# Patient Record
Sex: Male | Born: 1940
Health system: Southern US, Community
[De-identification: ages and names within clinical notes are randomized; demographics above are authoritative.]

## PROBLEM LIST (undated history)

## (undated) DIAGNOSIS — F1010 Alcohol abuse, uncomplicated: Secondary | ICD-10-CM

## (undated) DIAGNOSIS — M1990 Unspecified osteoarthritis, unspecified site: Secondary | ICD-10-CM

## (undated) DIAGNOSIS — C3490 Malignant neoplasm of unspecified part of unspecified bronchus or lung: Secondary | ICD-10-CM

## (undated) DIAGNOSIS — I671 Cerebral aneurysm, nonruptured: Secondary | ICD-10-CM

## (undated) HISTORY — PX: LUNG SURGERY: SHX703

## (undated) HISTORY — PX: BRAIN SURGERY: SHX531

---

## 1997-10-01 ENCOUNTER — Emergency Department (HOSPITAL_COMMUNITY): Admission: EM | Admit: 1997-10-01 | Discharge: 1997-10-01 | Payer: Self-pay | Admitting: Emergency Medicine

## 1998-05-06 ENCOUNTER — Emergency Department (HOSPITAL_COMMUNITY): Admission: EM | Admit: 1998-05-06 | Discharge: 1998-05-06 | Payer: Self-pay | Admitting: Emergency Medicine

## 1998-05-09 ENCOUNTER — Encounter: Admission: RE | Admit: 1998-05-09 | Discharge: 1998-05-09 | Payer: Self-pay | Admitting: Internal Medicine

## 1998-05-19 ENCOUNTER — Encounter: Admission: RE | Admit: 1998-05-19 | Discharge: 1998-05-19 | Payer: Self-pay | Admitting: Internal Medicine

## 2005-06-10 IMAGING — CR DG CHEST 1V PORT
1 series · 1 of 1 positions shown · non-contrast
Comparison: none

CLINICAL DATA: PORTABLE CHEST - 1 VIEW:

[view not recorded]
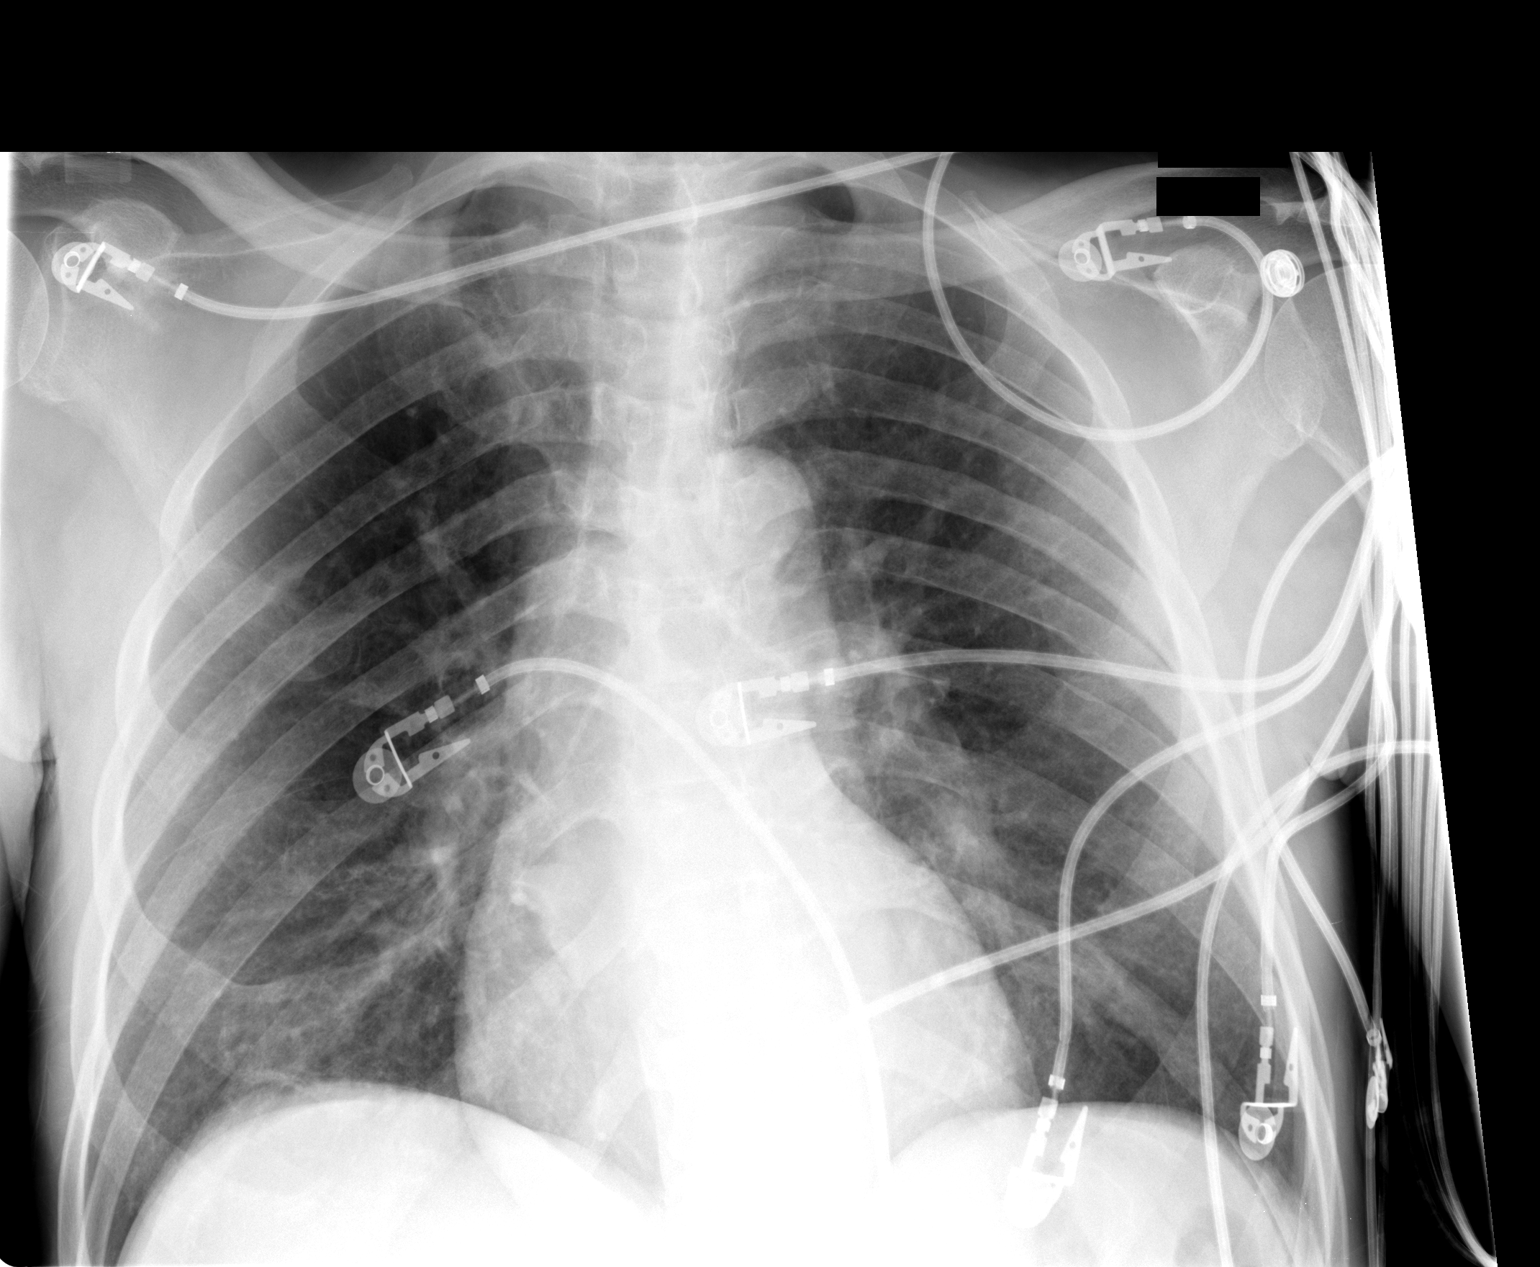

[1 of 1 positions shown; findings below may reference images not displayed]

FINDINGS: The heart size is normal.  There are no effusions or edema.  No focal airspace opacities are noted.
IMPRESSION: No active cardiopulmonary disease.

## 2005-06-10 IMAGING — CT CT HEAD W/O CM
1 series · 16 of 30 positions shown, 20 images · IV contrast (agent unspecified)
Comparison: None.

CLINICAL DATA: Headaches, evaluate for CVA.
 HEAD CT WITHOUT CONTRAST:
TECHNIQUE: Contiguous axial images were obtained from the base of the skull through the vertex according to standard protocol without contrast.

[Series 3: headseq 4.8 h45s · axial · 0.47mm/px · z∈[-221,-90]mm · 16 of 30 slices shown, 20 images]
[im 2/30  brain]
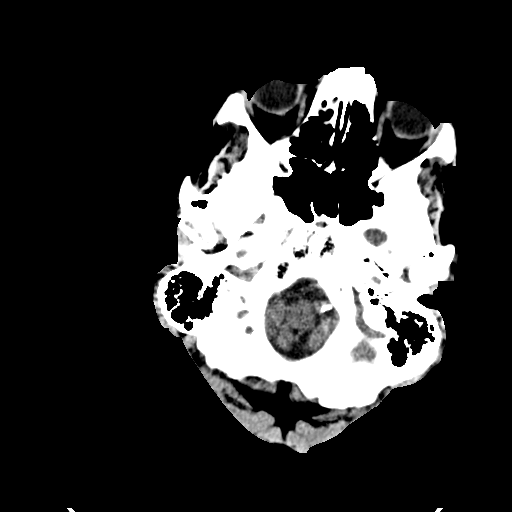
[im 2/30  bone]
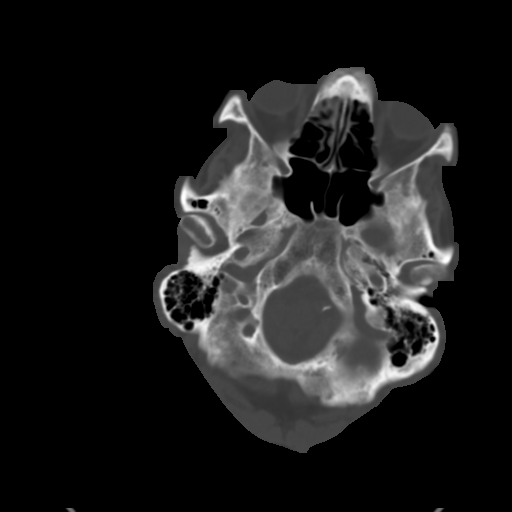
[im 4/30  brain]
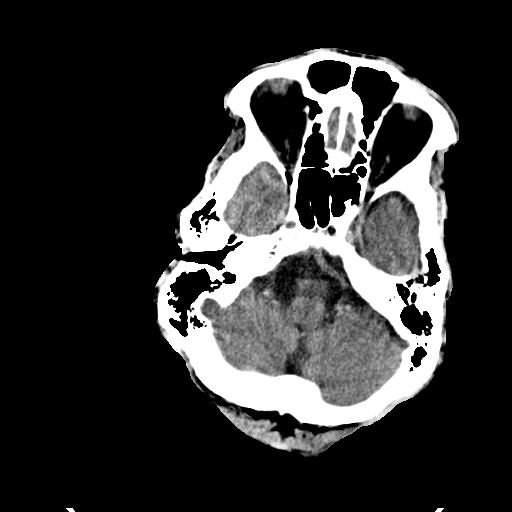
[im 6/30  brain]
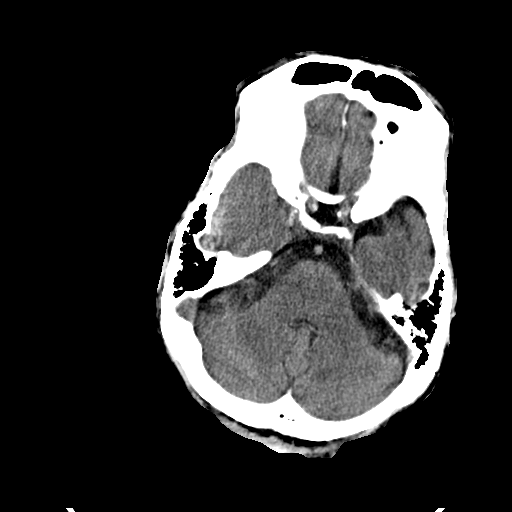
[im 8/30  brain]
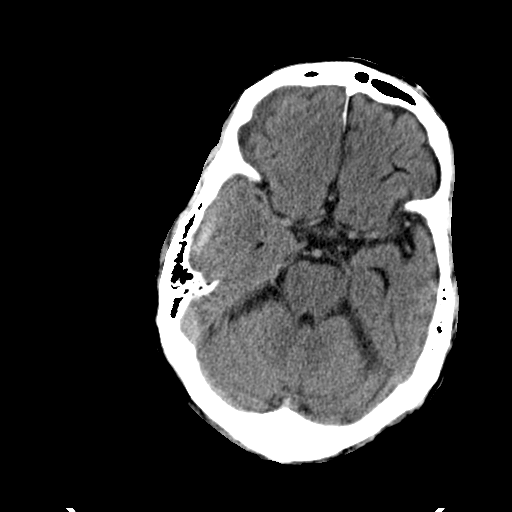
[im 9/30  brain]
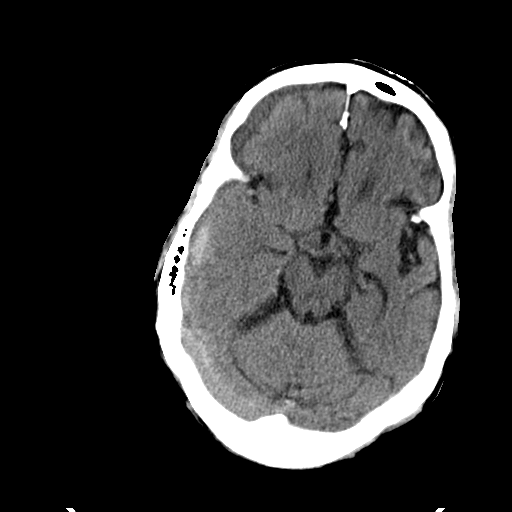
[im 9/30  bone]
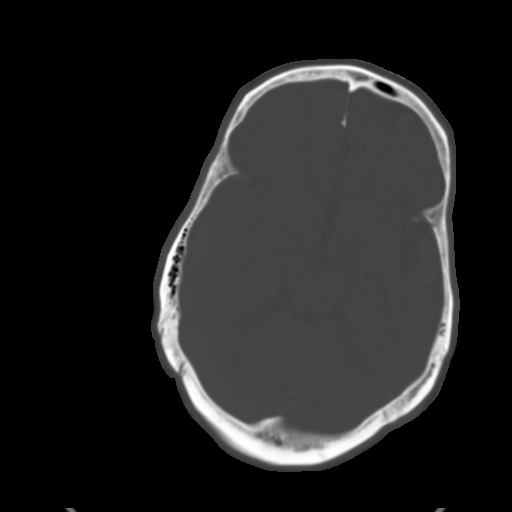
[im 11/30  brain]
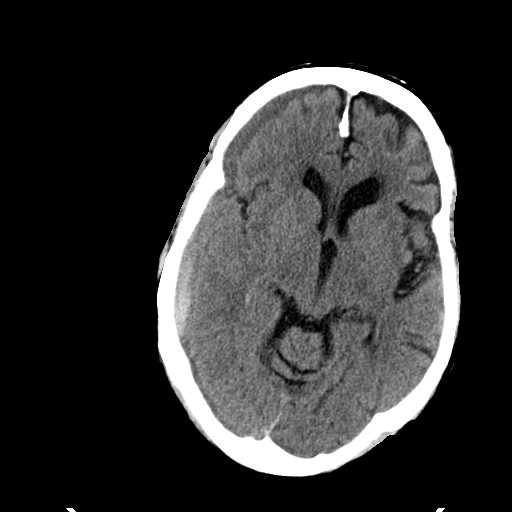
[im 13/30  brain]
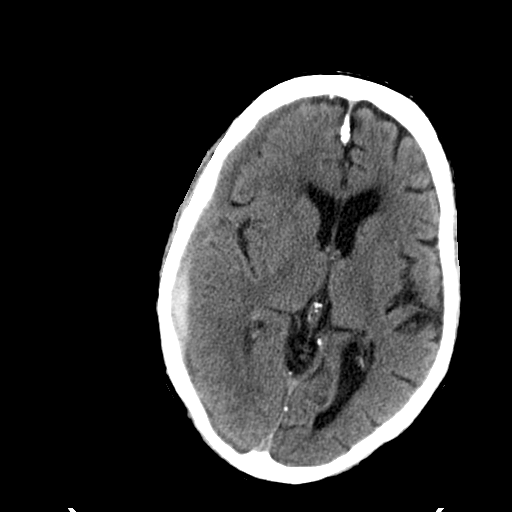
[im 15/30  brain]
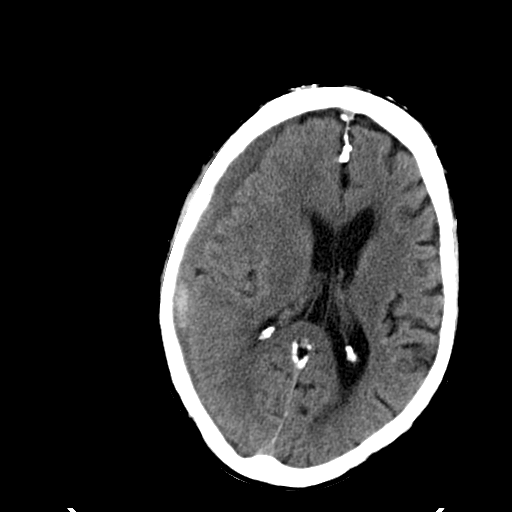
[im 16/30  brain]
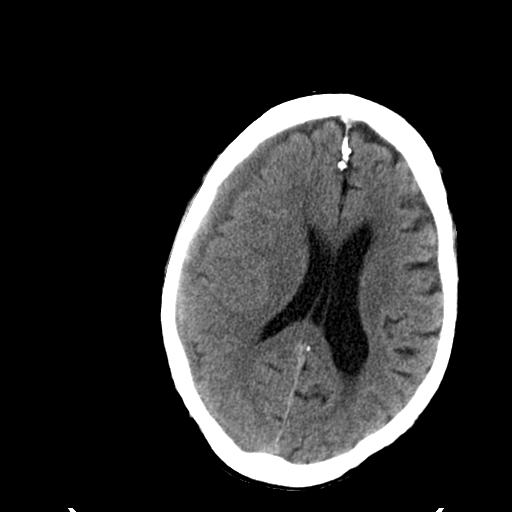
[im 16/30  bone]
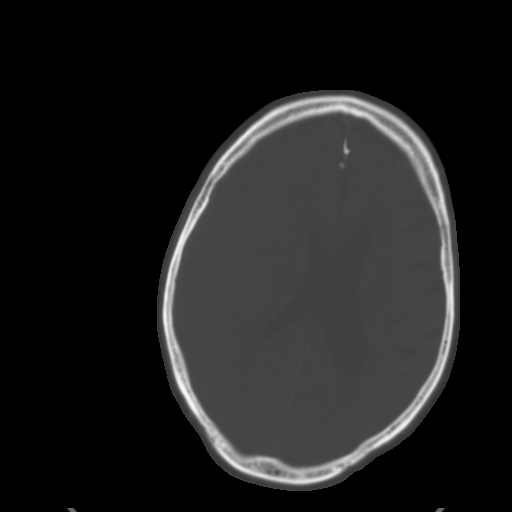
[im 18/30  brain]
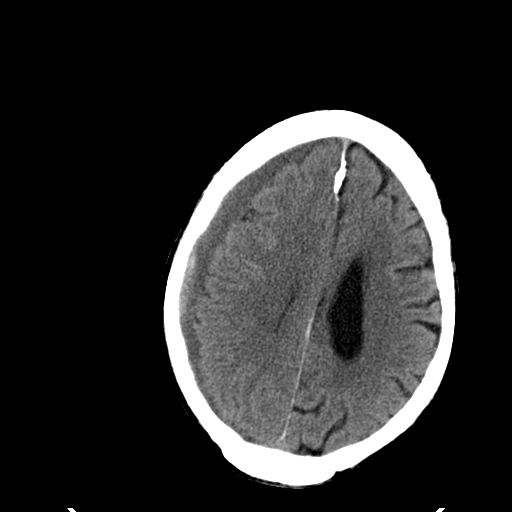
[im 20/30  brain]
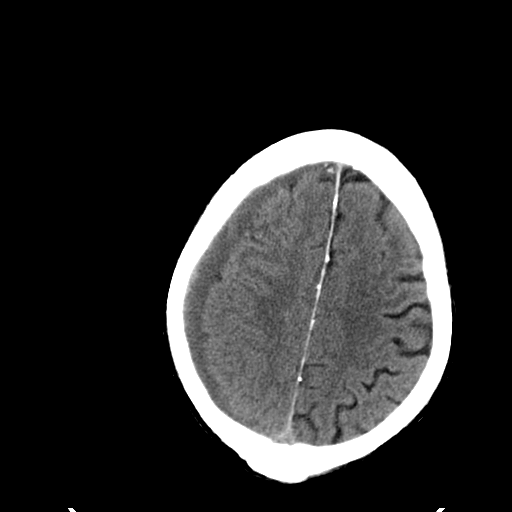
[im 22/30  brain]
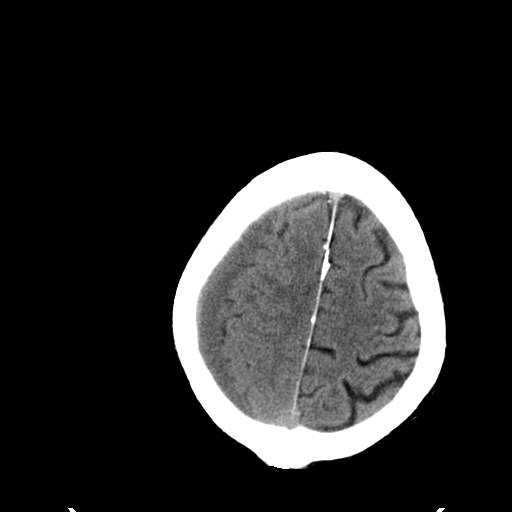
[im 23/30  brain]
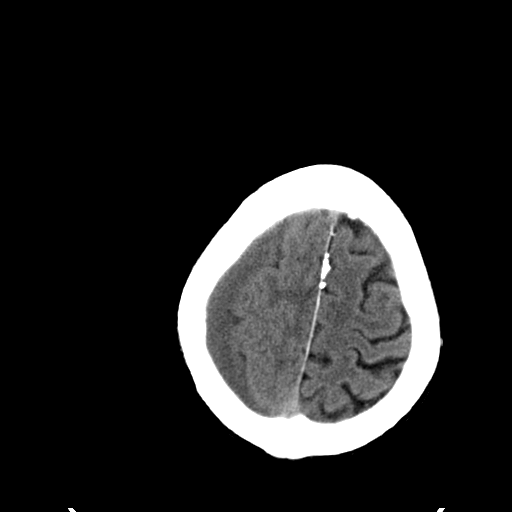
[im 23/30  bone]
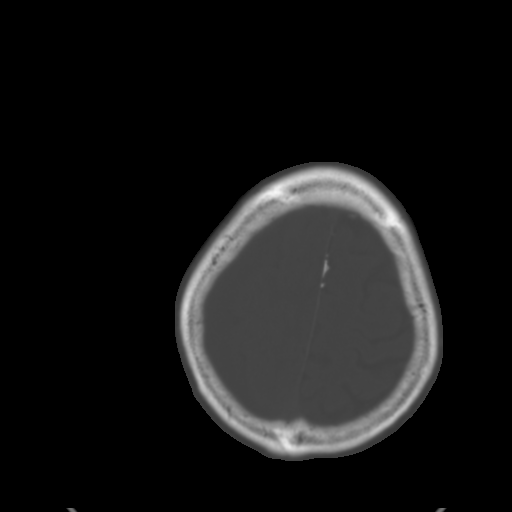
[im 25/30  brain]
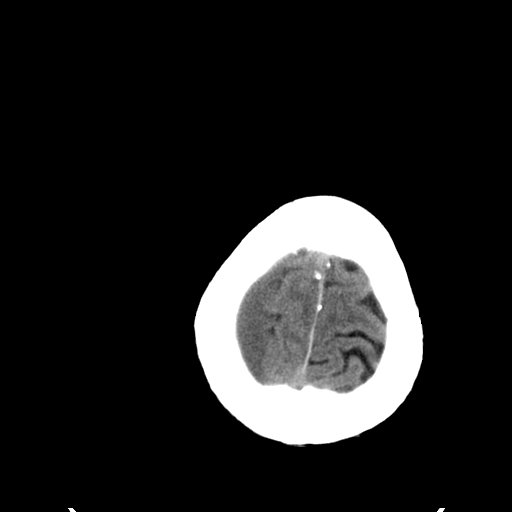
[im 27/30  brain]
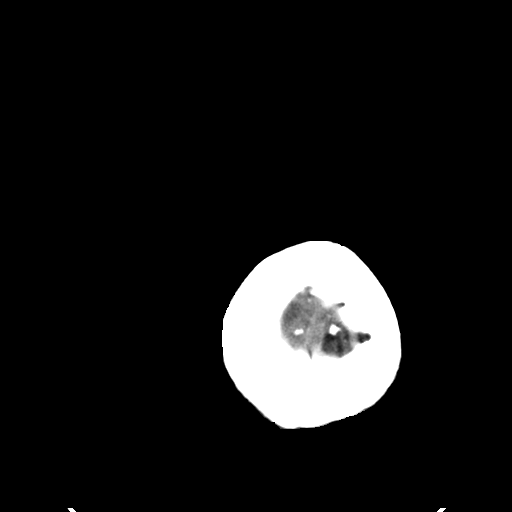
[im 29/30  brain]
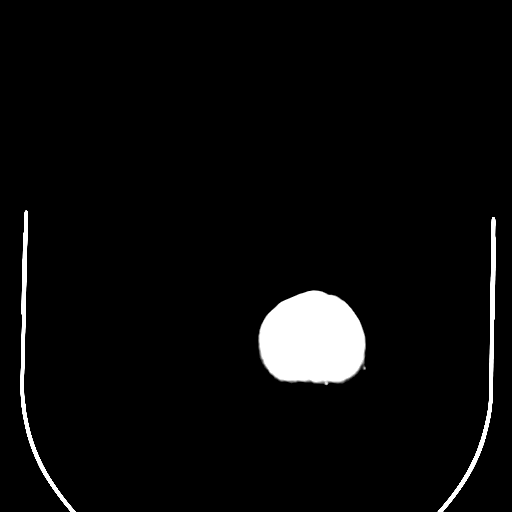

[16 of 30 positions shown; findings below may reference images not displayed]

FINDINGS: Large right subdural hematoma is identified.  There are acute and subacute components.  There is also probably a chronic component.
 Mass effect on the right cerebral hemisphere is noted.  There is leftward shift of the midline by approximately 7 mm.  
 The subdural hematoma measures approximately 9.3 mm.  
 No additional intracranial hemorrhage is noted.  There is mild diffuse low attenuation within subcortical and periventricular white matter consistent with small vessel ischemic disease.  The ventricular volumes are within normal limits.
 Mastoid air cells and paranasal sinuses are normally aerated.
IMPRESSION: Acute on subacute right subdural hematoma with mass effect on the adjacent cerebral hemisphere with midline shift between 7 and 8 mm.

## 2005-06-11 ENCOUNTER — Ambulatory Visit: Payer: Self-pay | Admitting: Physical Medicine & Rehabilitation

## 2005-06-11 ENCOUNTER — Inpatient Hospital Stay (HOSPITAL_COMMUNITY): Admission: EM | Admit: 2005-06-11 | Discharge: 2005-06-17 | Payer: Self-pay | Admitting: Emergency Medicine

## 2005-06-12 IMAGING — CT CT HEAD W/O CM
1 of 2 series · 13 of 30 positions shown, 17 images · IV contrast (agent unspecified)
Comparison: Head CT [DATE]

CLINICAL DATA: Status post evacuation of right subdural hematoma.
 HEAD CT WITHOUT CONTRAST:
TECHNIQUE: Contiguous axial images were obtained from the base of the skull through the vertex according to standard protocol without contrast.

[Series 2: brain · axial · 0.47mm/px · z∈[+125,+245]mm · 13 of 28 slices shown, 17 images]
[im 2/28  brain]
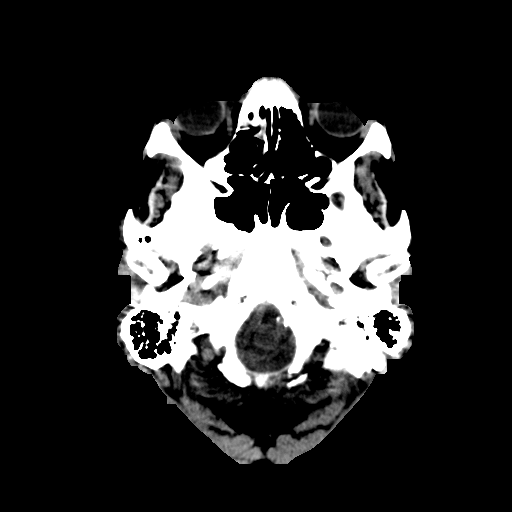
[im 2/28  bone]
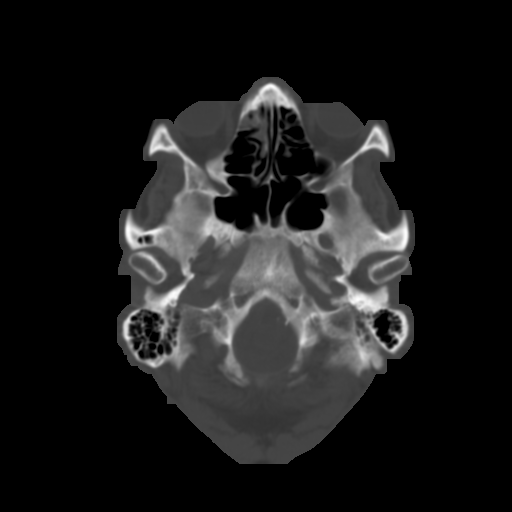
[im 4/28  brain]
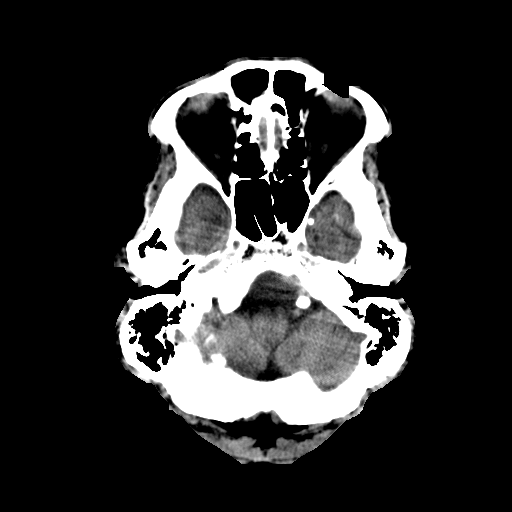
[im 6/28  brain]
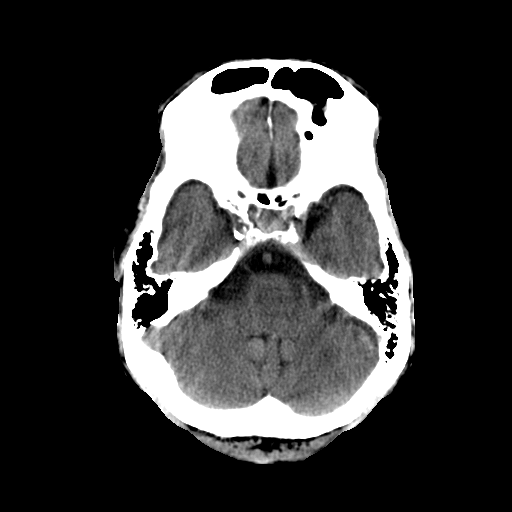
[im 8/28  brain]
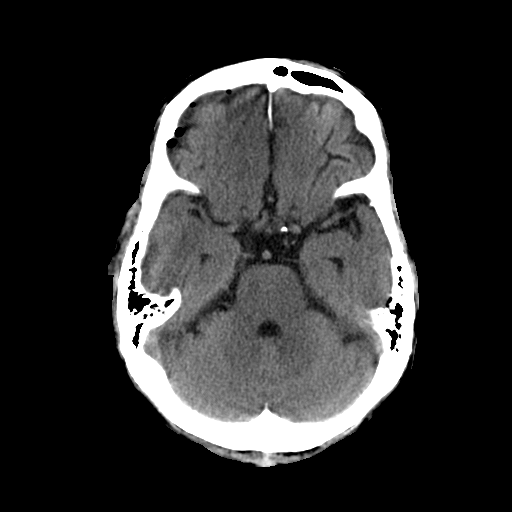
[im 10/28  brain]
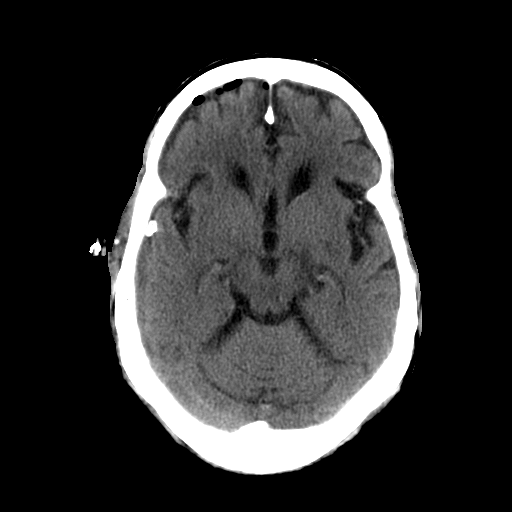
[im 10/28  bone]
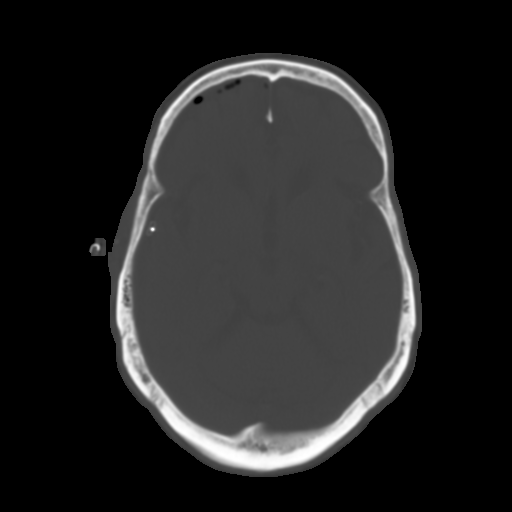
[im 12/28  brain]
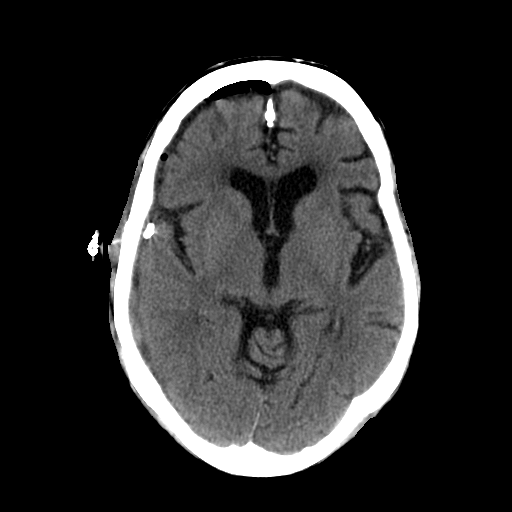
[im 14/28  brain]
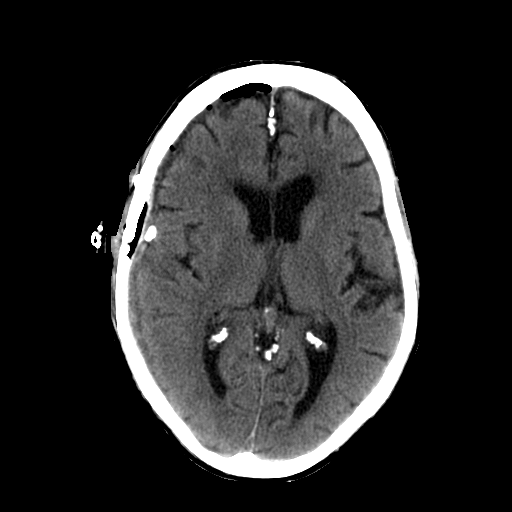
[im 16/28  brain]
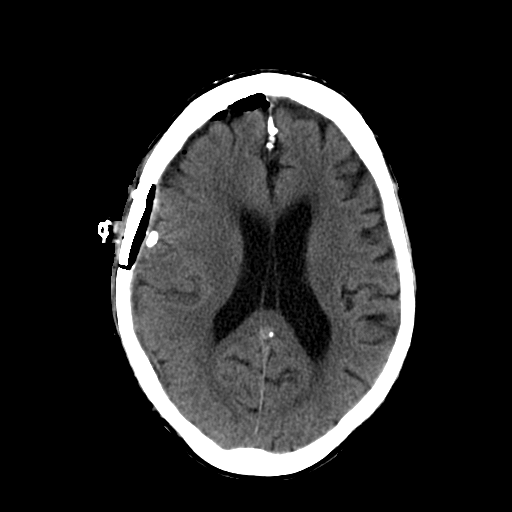
[im 18/28  brain]
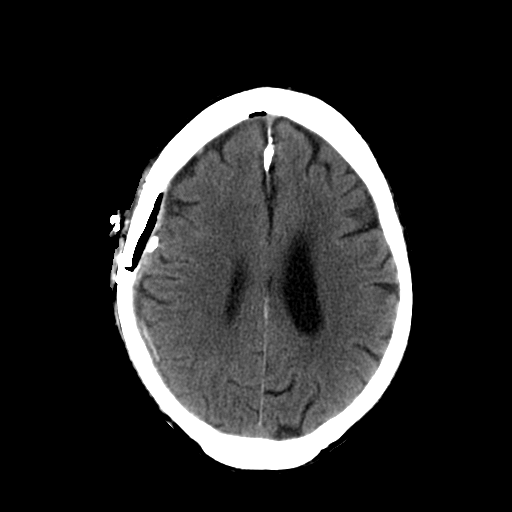
[im 18/28  bone]
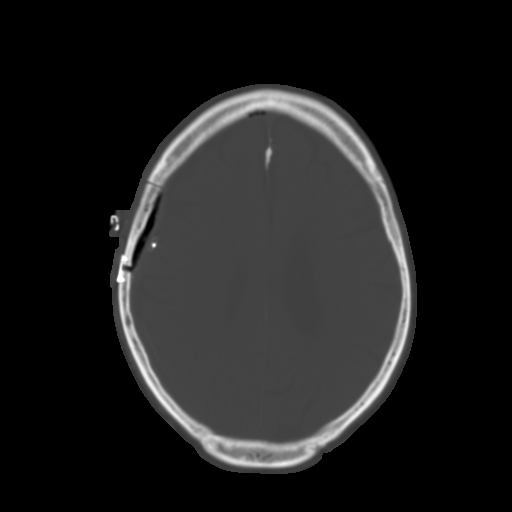
[im 20/28  brain]
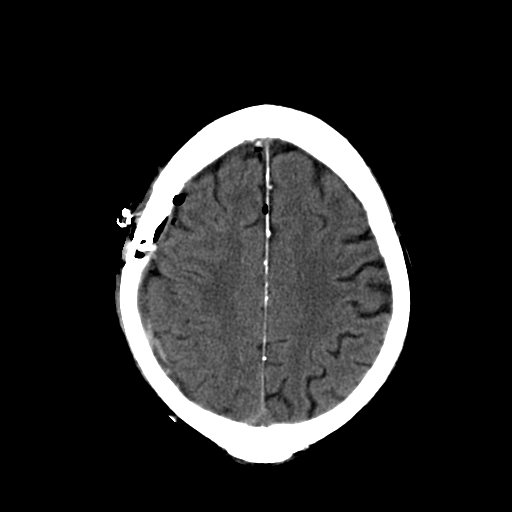
[im 22/28  brain]
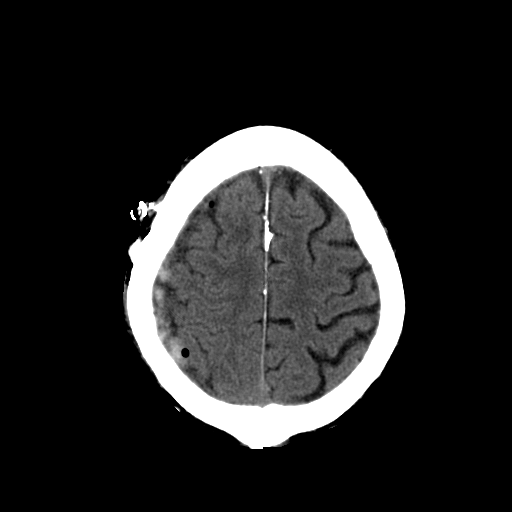
[im 24/28  brain]
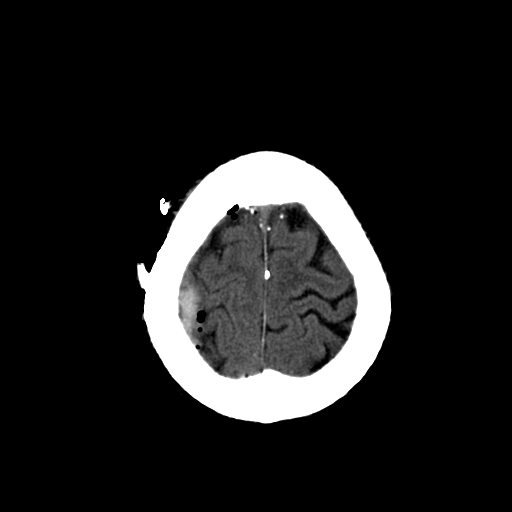
[im 26/28  brain]
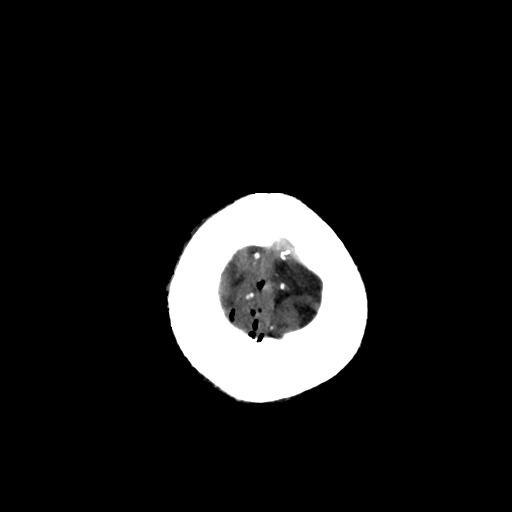
[im 26/28  bone]
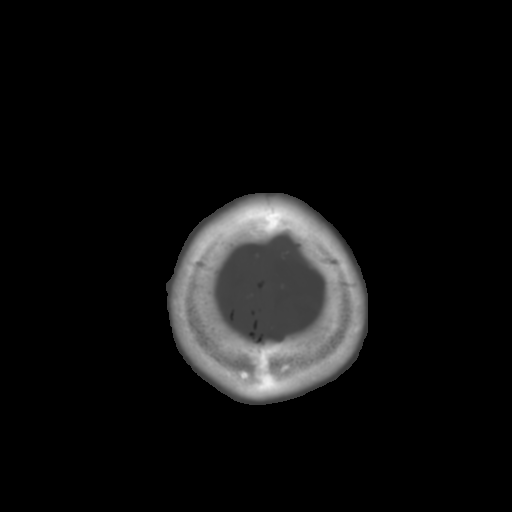

[13 of 30 positions shown; findings below may reference images not displayed]

FINDINGS: The patient is status post right temporoparietal craniotomy.  Acute on chronic subdural hematoma is again identified, now with foci of gas adjacent to the craniotomy site.  Overall, the amount of fluid is decreased since the prior study.  There is also interval decrease in leftward midline shift, currently measuring 2 to 3 mm.  The ventricles are unchanged in size.
IMPRESSION: Status post right temporoparietal craniotomy with decrease in subdural acute on chronic hematoma and decreased leftward midline shift since the prior study.

## 2005-06-14 IMAGING — CT CT HEAD W/O CM
1 of 2 series · 13 of 30 positions shown, 17 images · IV contrast (agent unspecified)
Comparison: [DATE].

CLINICAL DATA: Status post craniotomy for SDH.  Followup.  
HEAD CT WITHOUT CONTRAST:
TECHNIQUE: Contiguous axial images were obtained from the base of the skull through the vertex according to standard protocol without contrast.

[Series 2: brain · axial · 0.47mm/px · z∈[+149,+278]mm · 13 of 32 slices shown, 17 images]
[im 3/32  brain]
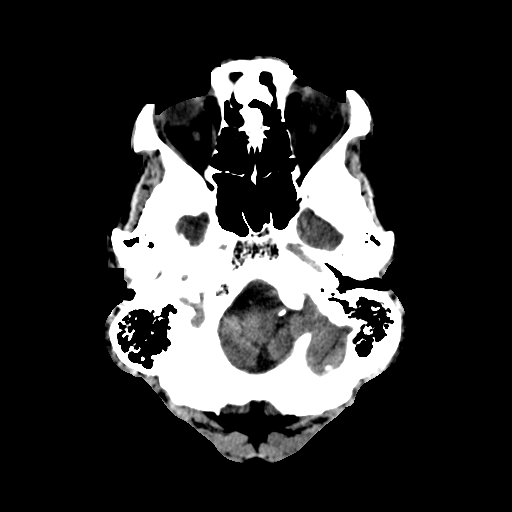
[im 3/32  bone]
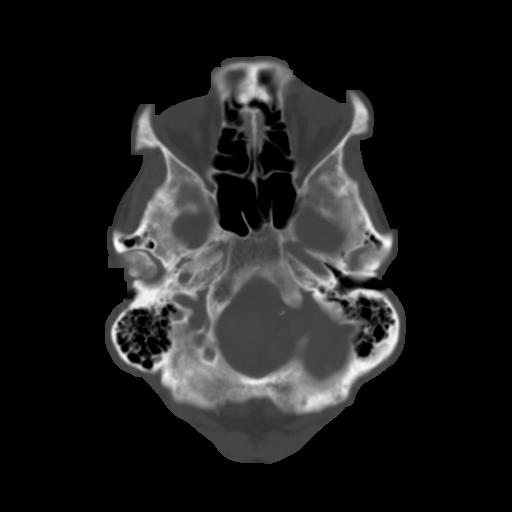
[im 5/32  brain]
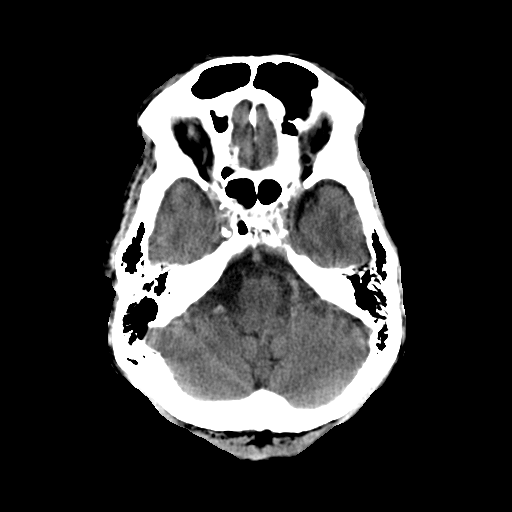
[im 7/32  brain]
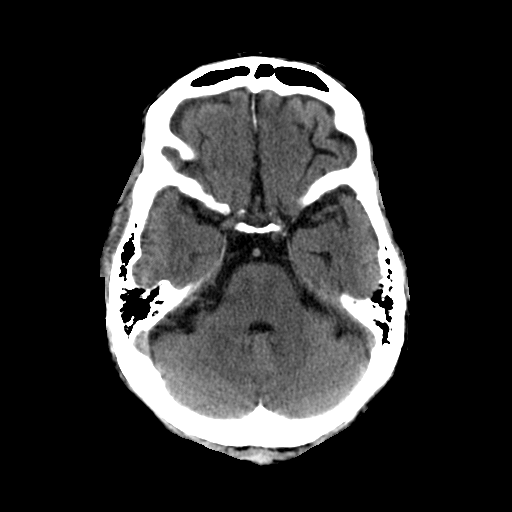
[im 9/32  brain]
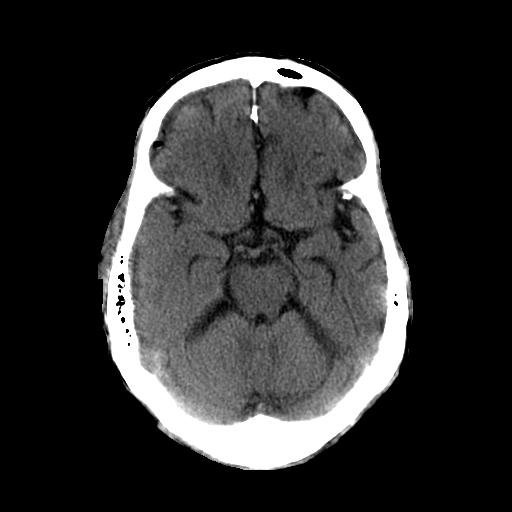
[im 12/32  brain]
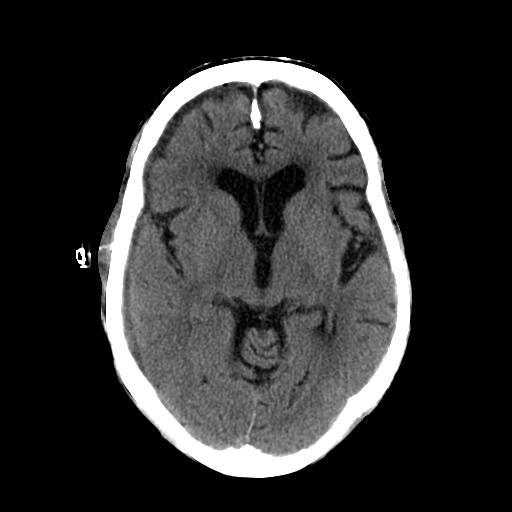
[im 12/32  bone]
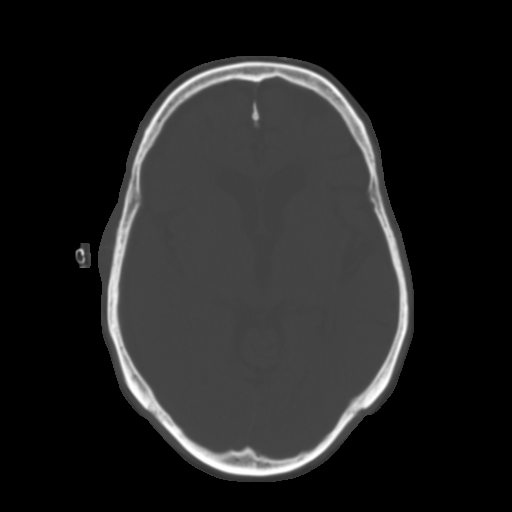
[im 14/32  brain]
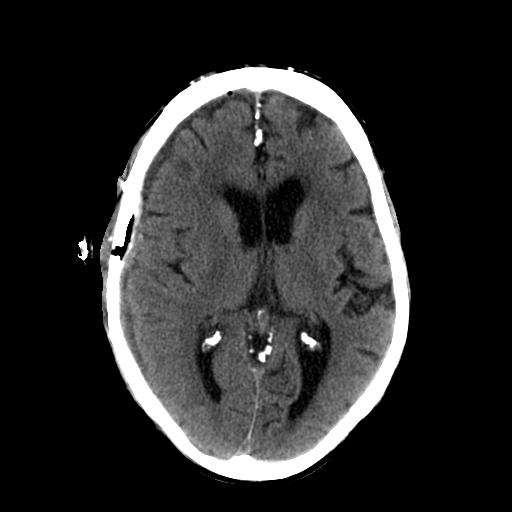
[im 16/32  brain]
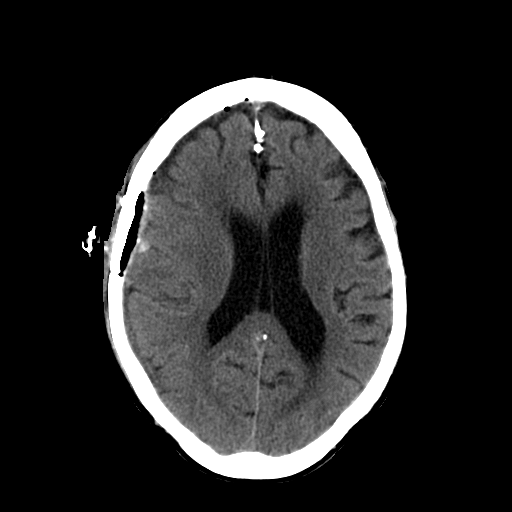
[im 18/32  brain]
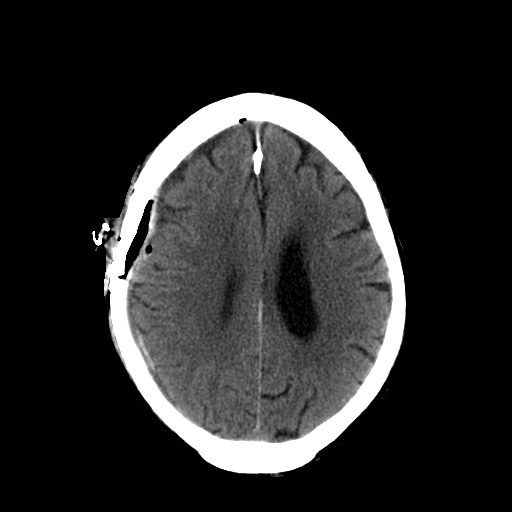
[im 20/32  brain]
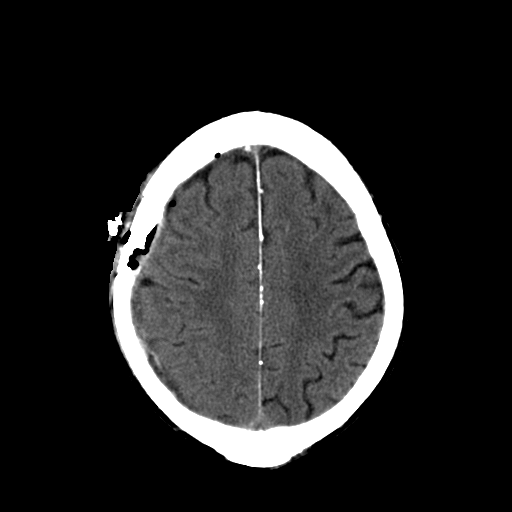
[im 20/32  bone]
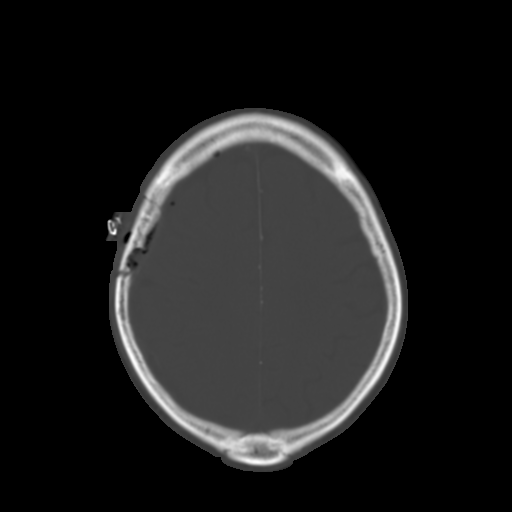
[im 23/32  brain]
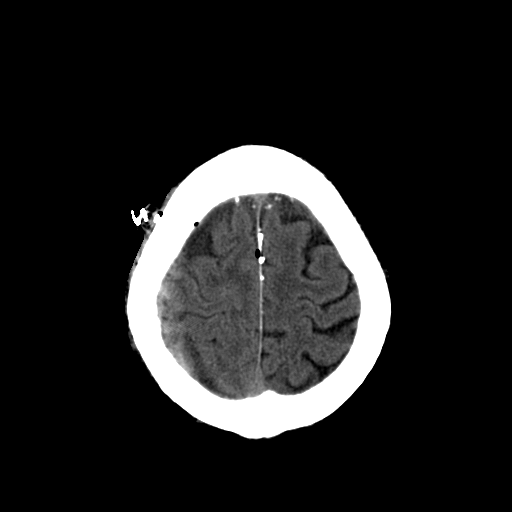
[im 25/32  brain]
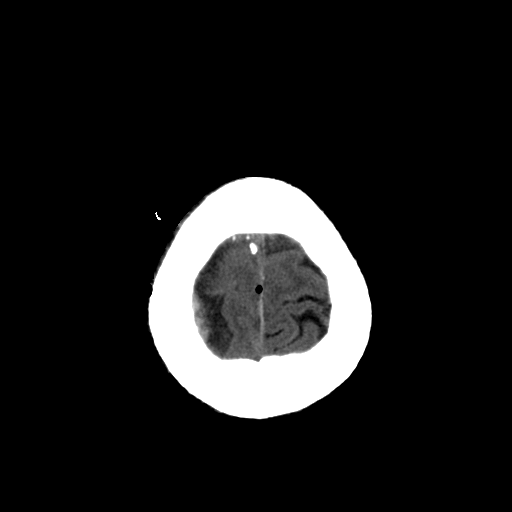
[im 27/32  brain]
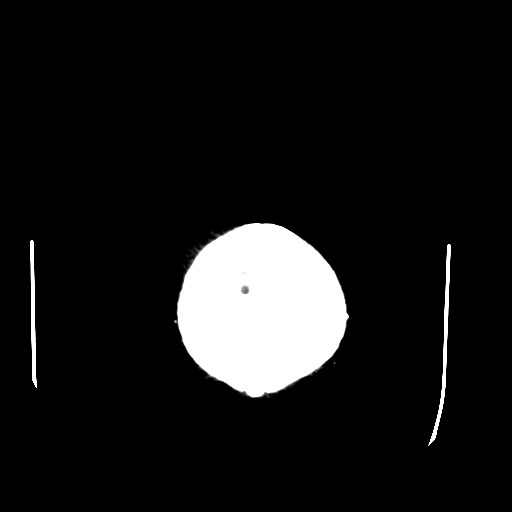
[im 29/32  brain]
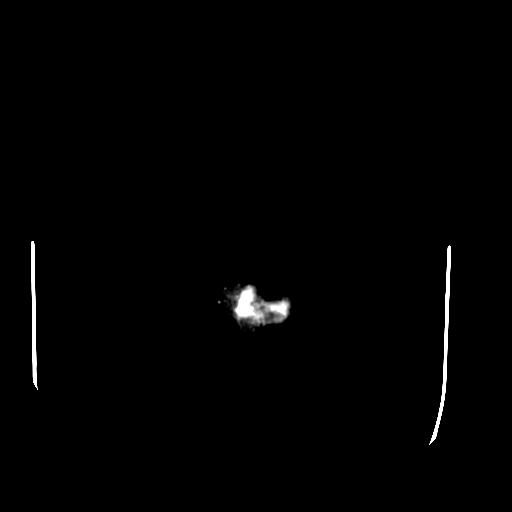
[im 29/32  bone]
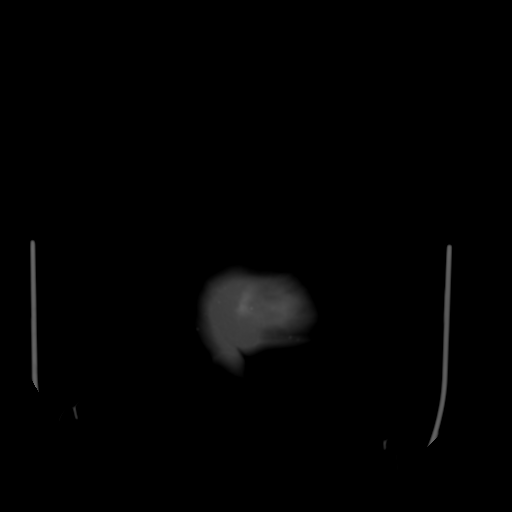

[13 of 30 positions shown; findings below may reference images not displayed]

Right frontal craniotomy site with bone flap in place.  Minimal amount of extraaxial gas at the craniotomy site.  Minimal amount of subdural blood in the high right parietal region.  This has decreased in degree.  Small right frontoparietal subdural hygroma. Minimal partial effacement of some of the right cerebral sulci.
IMPRESSION: Post-operative changes as described above.  No significant residual SDH.  Small right convexity subdural hygroma.

## 2005-06-15 IMAGING — CR DG CHEST 1V PORT
1 series · 1 of 1 positions shown · non-contrast
Comparison: none

CLINICAL DATA: Fever, subdural hemorrhage. 
 PORTABLE CHEST - [SO]: 
 AP erect portable film of the chest made [DATE] at [SO] hours is compared to a previous study of [DATE] at [SO] hours and shows an area of increased density to have developed in the right mid lung which could be in the area of infiltrate in the superior segment of the right lower lobe. There is also some atelectasis again seen at both bases.

[view not recorded]
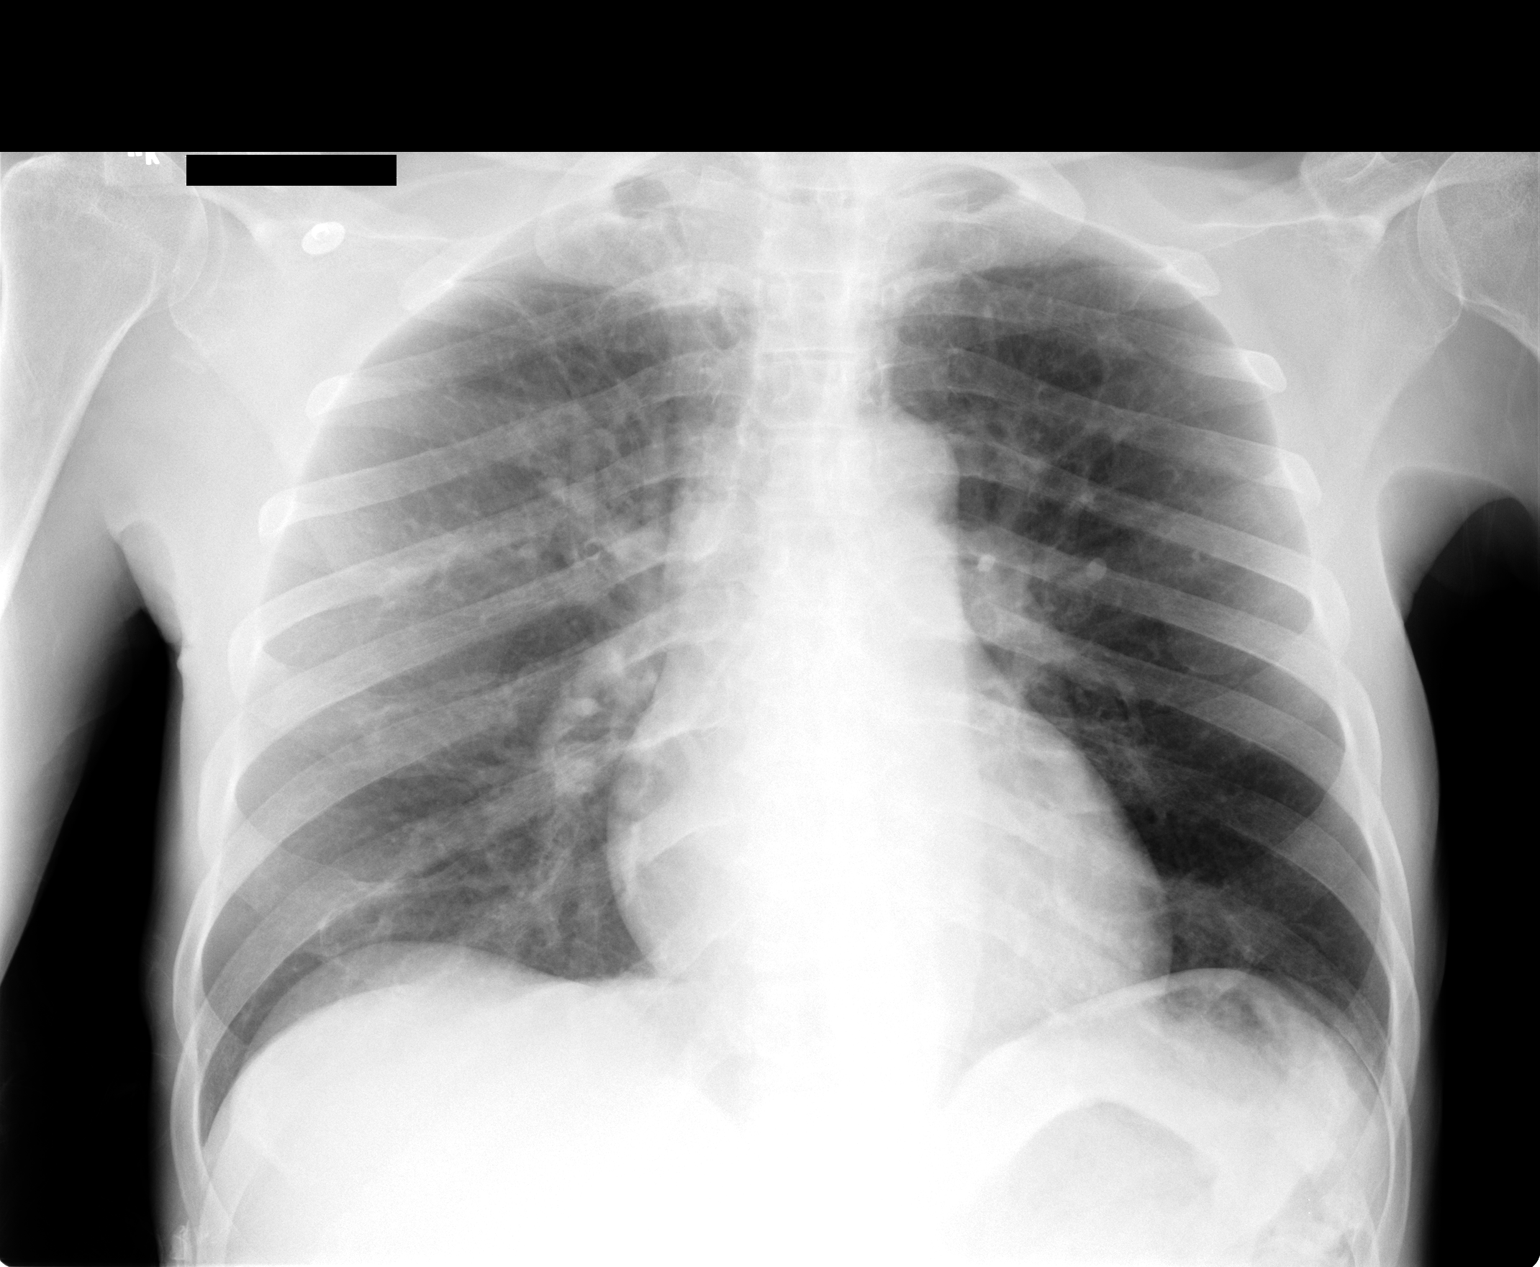

[1 of 1 positions shown; findings below may reference images not displayed]

IMPRESSION: Questionable early infiltrate superior segment right lower lobe bilateral basilar atelectasis.

## 2005-07-14 ENCOUNTER — Encounter: Admission: RE | Admit: 2005-07-14 | Discharge: 2005-07-14 | Payer: Self-pay | Admitting: Neurosurgery

## 2005-07-14 IMAGING — CT CT HEAD W/O CM
1 of 2 series · 14 of 30 positions shown, 18 images · IV contrast (agent unspecified)
Comparison: [DATE].

CLINICAL DATA: Follow up subdural hematoma.
 HEAD CT WITHOUT CONTRAST:
TECHNIQUE: Contiguous axial images were obtained from the base of the skull through the vertex according to standard protocol without contrast.

[Series 2: brain · axial · 0.49mm/px · z∈[+37,+171]mm · 14 of 30 slices shown, 18 images]
[im 2/30  brain]
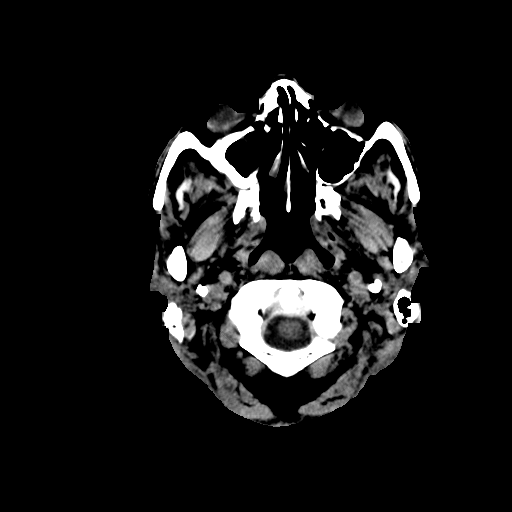
[im 2/30  bone]
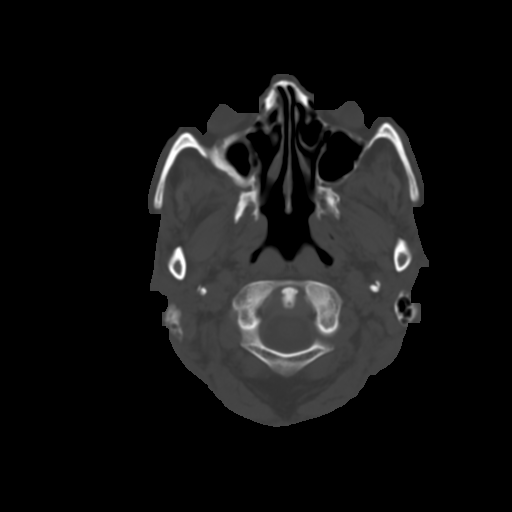
[im 4/30  brain]
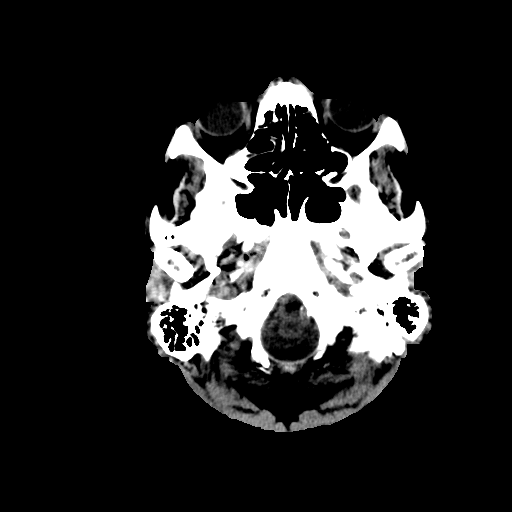
[im 6/30  brain]
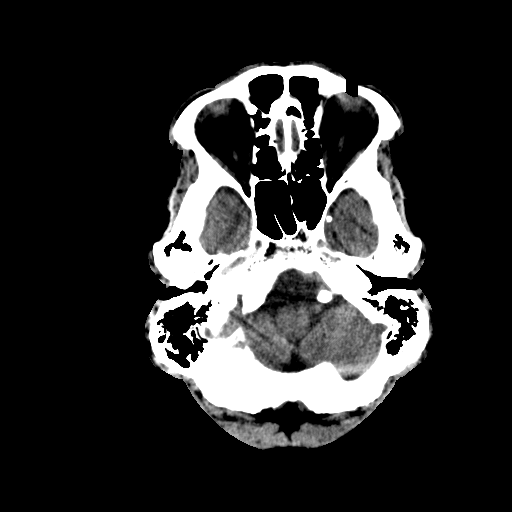
[im 8/30  brain]
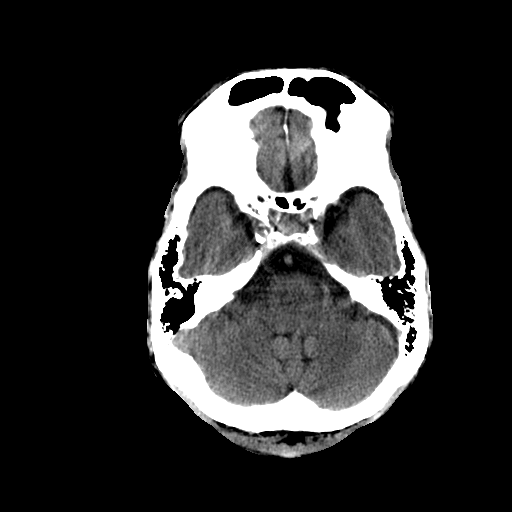
[im 10/30  brain]
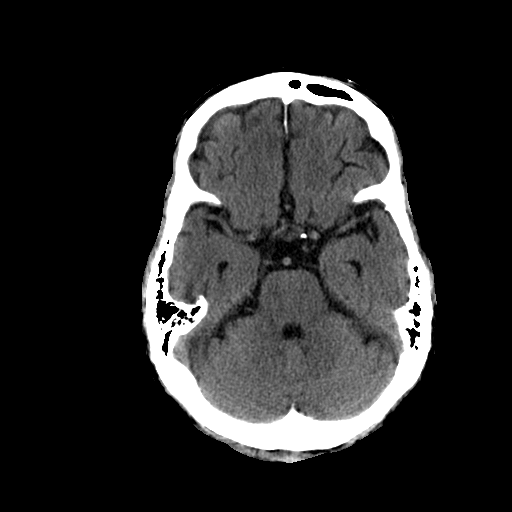
[im 10/30  bone]
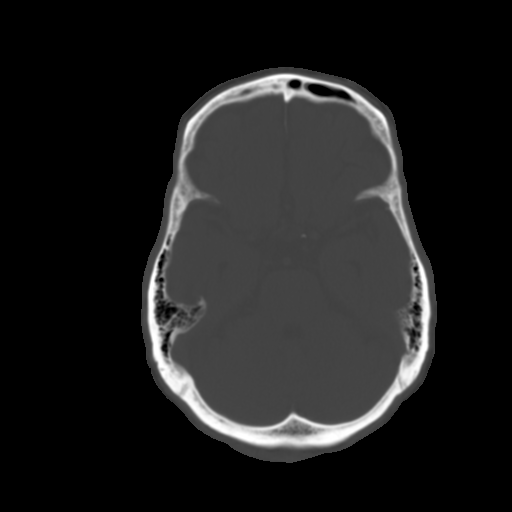
[im 12/30  brain]
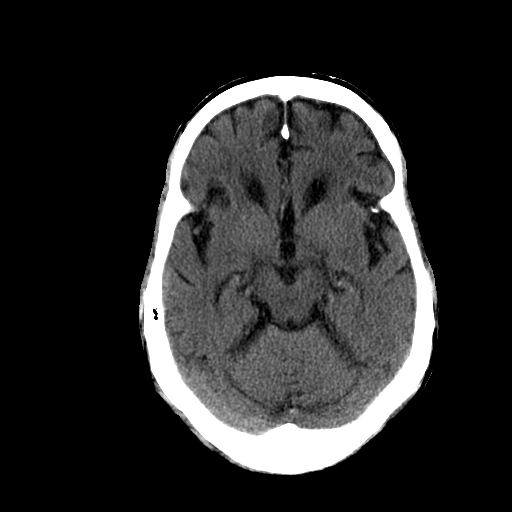
[im 14/30  brain]
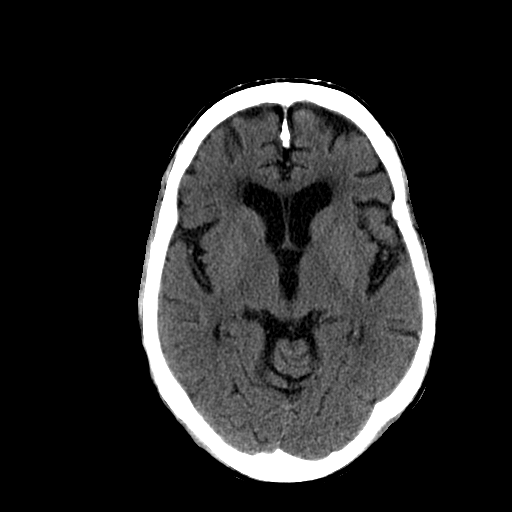
[im 16/30  brain]
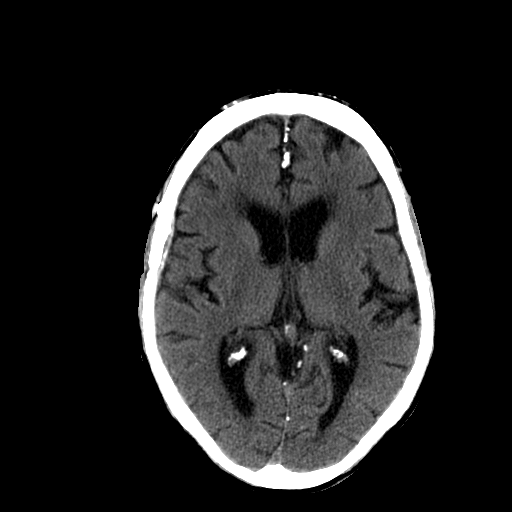
[im 18/30  brain]
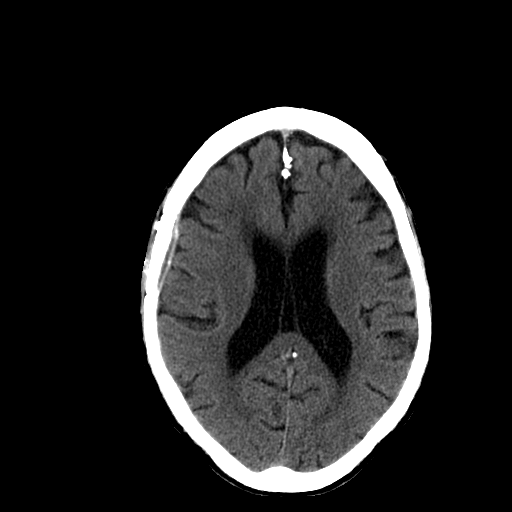
[im 18/30  bone]
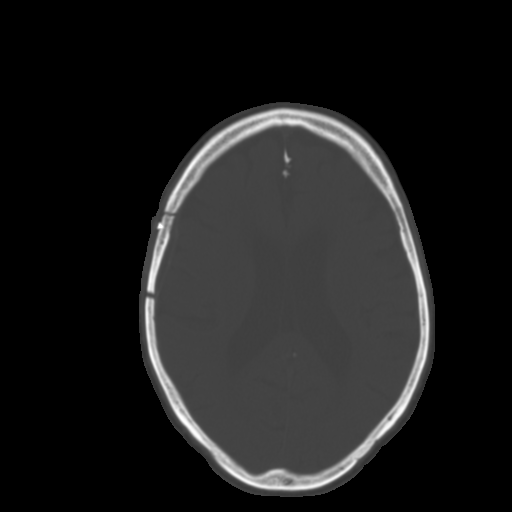
[im 20/30  brain]
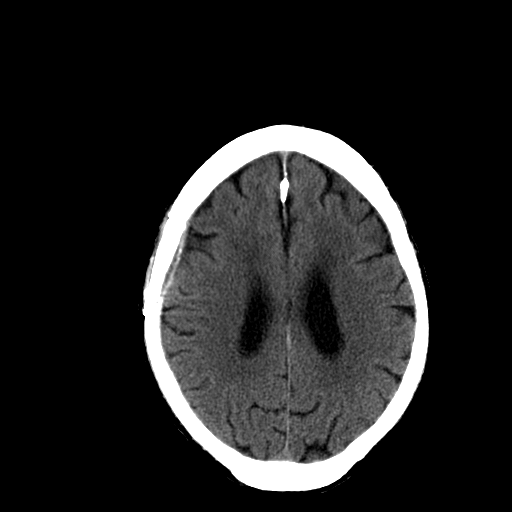
[im 22/30  brain]
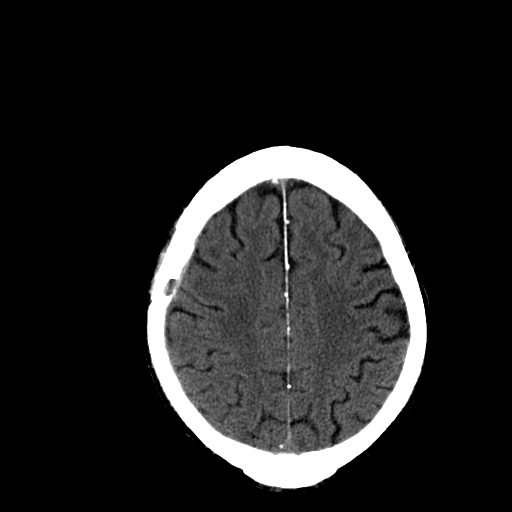
[im 24/30  brain]
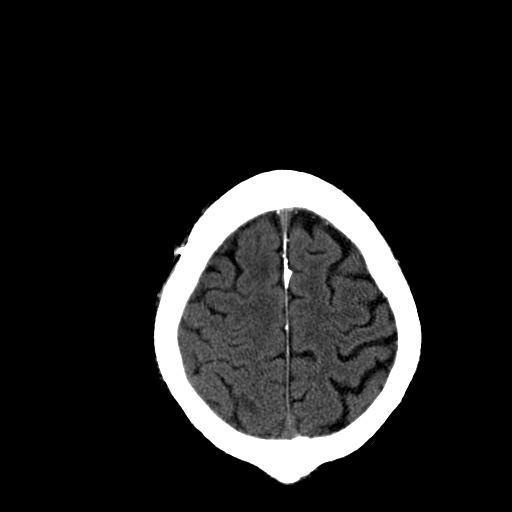
[im 26/30  brain]
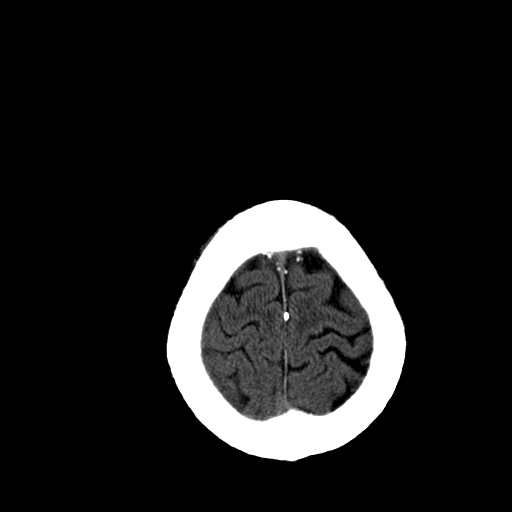
[im 26/30  bone]
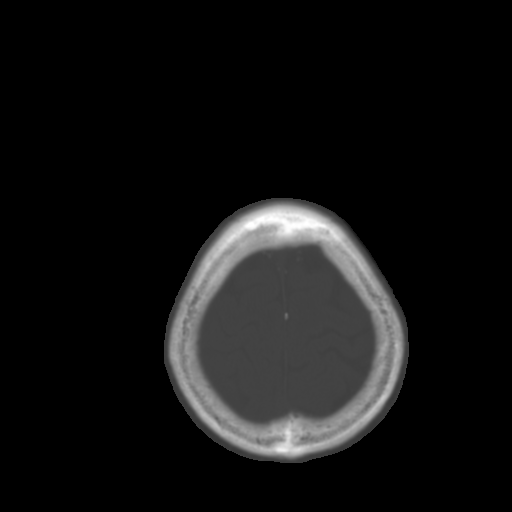
[im 28/30  brain]
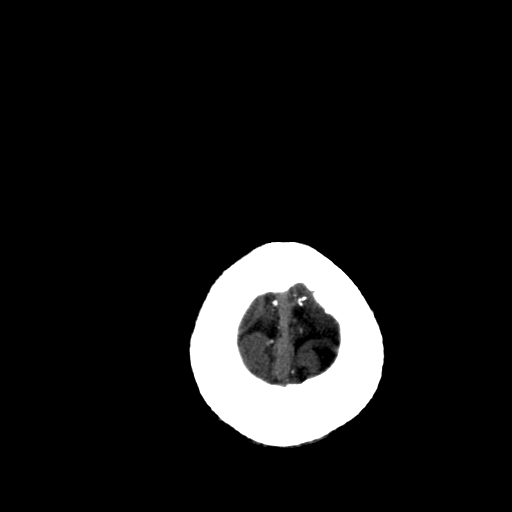

[14 of 30 positions shown; findings below may reference images not displayed]

The right subdural hematoma noted previously has resolved.  Postop changes are noted in the right frontoparietal region where craniotomy has been performed.  The ventricular system remains prominent, as are the cortical sulci indicative of diffuse atrophy.  No acute intracranial abnormality is seen.
IMPRESSION: Resolution of right subdural hematoma with postop changes noted.  No acute intracranial abnormality.

## 2005-11-13 ENCOUNTER — Emergency Department (HOSPITAL_COMMUNITY): Admission: EM | Admit: 2005-11-13 | Discharge: 2005-11-14 | Payer: Self-pay | Admitting: Emergency Medicine

## 2005-11-14 IMAGING — CT CT HEAD W/O CM
1 series · 16 of 30 positions shown, 20 images · IV contrast (agent unspecified)
Comparison: [DATE].

CLINICAL DATA: Status post fall with injury to nose and head.  Alcohol.
 HEAD CT WITHOUT CONTRAST:
TECHNIQUE: Contiguous axial images were obtained from the base of the skull through the vertex according to standard protocol without contrast.

[Series 2: head_seq 4.5 h45s st · axial · 0.43mm/px · z∈[-169,-7]mm · 16 of 40 slices shown, 20 images]
[im 2/40  brain]
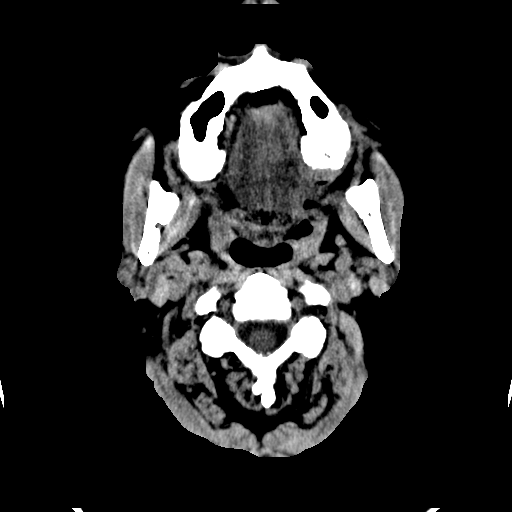
[im 2/40  bone]
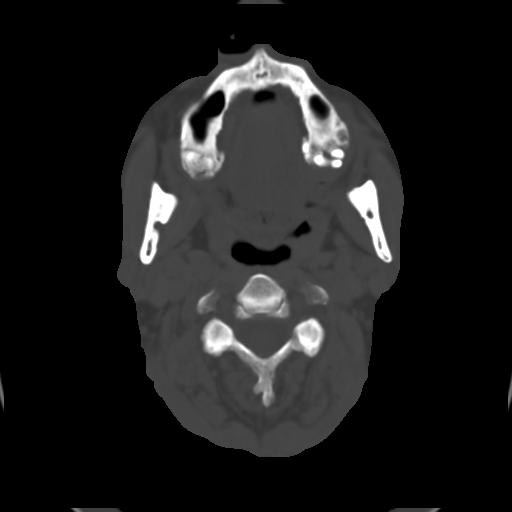
[im 5/40  brain]
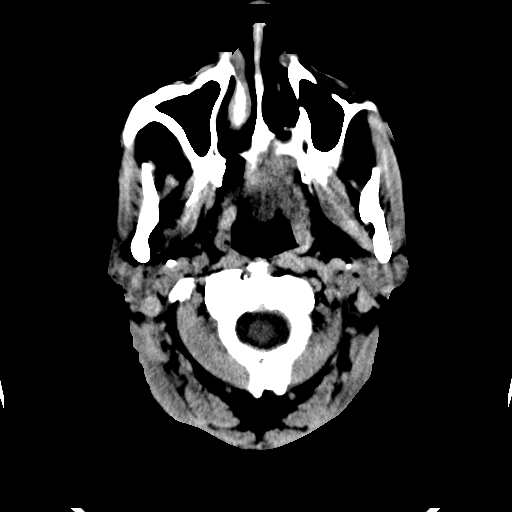
[im 7/40  brain]
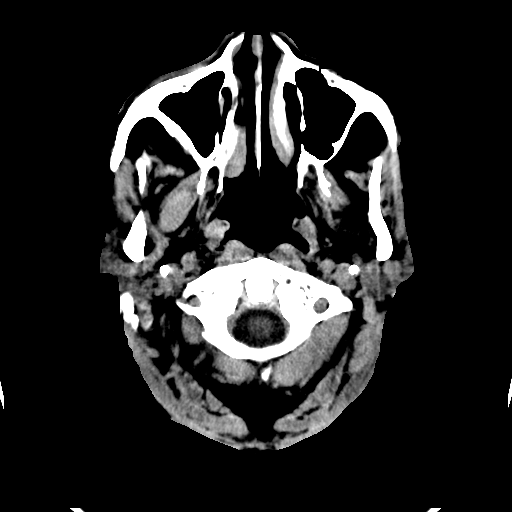
[im 10/40  brain]
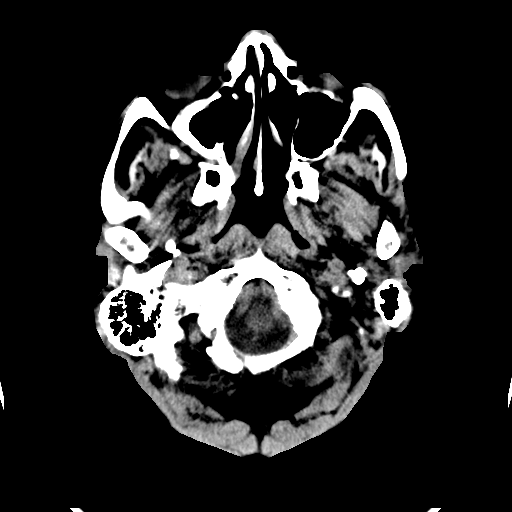
[im 11/40  brain]
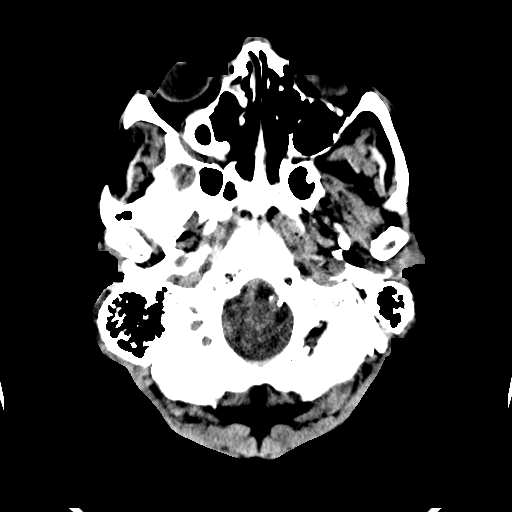
[im 11/40  bone]
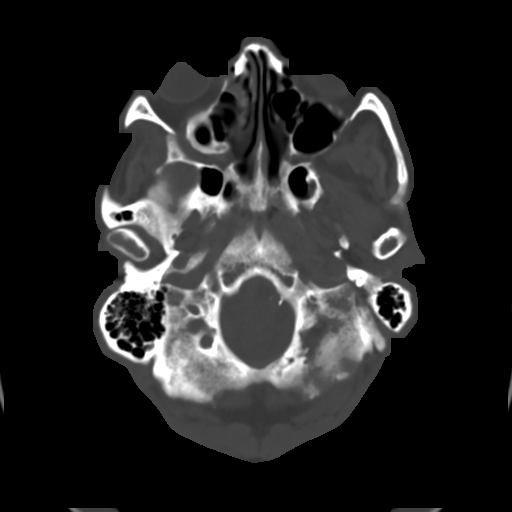
[im 14/40  brain]
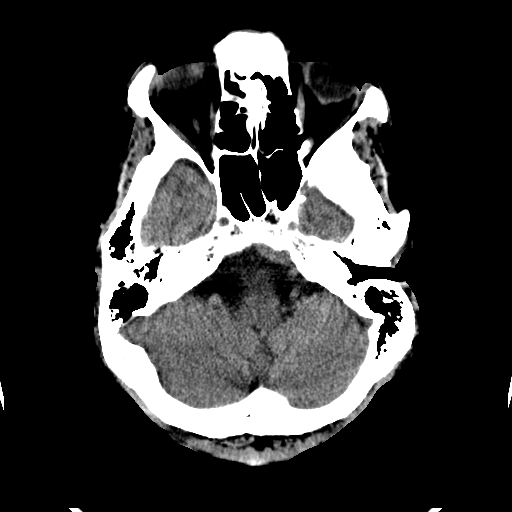
[im 17/40  brain]
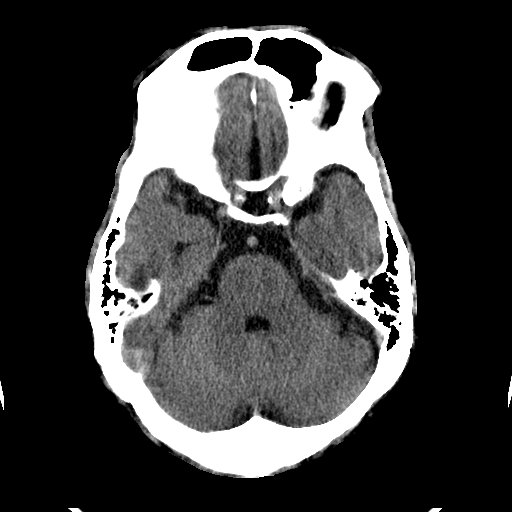
[im 19/40  brain]
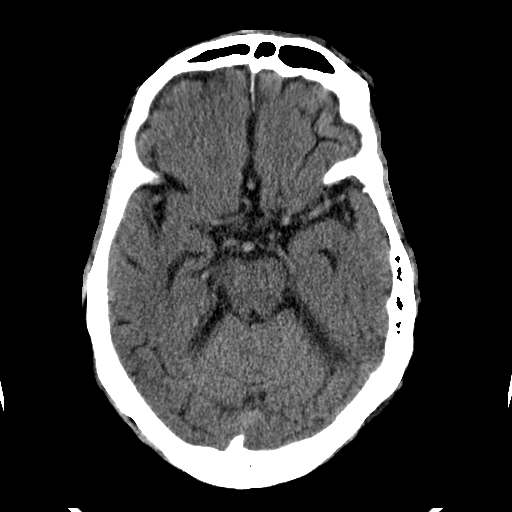
[im 21/40  brain]
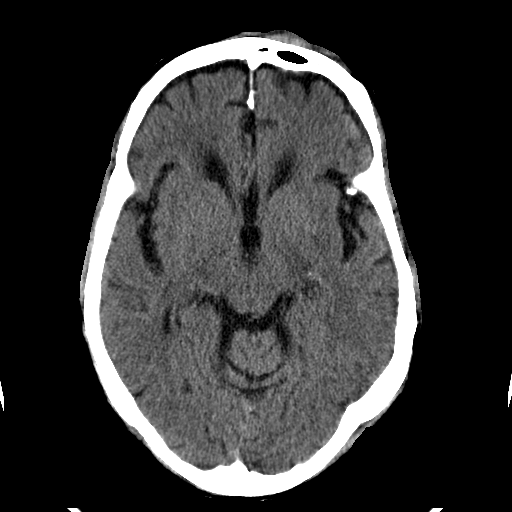
[im 21/40  bone]
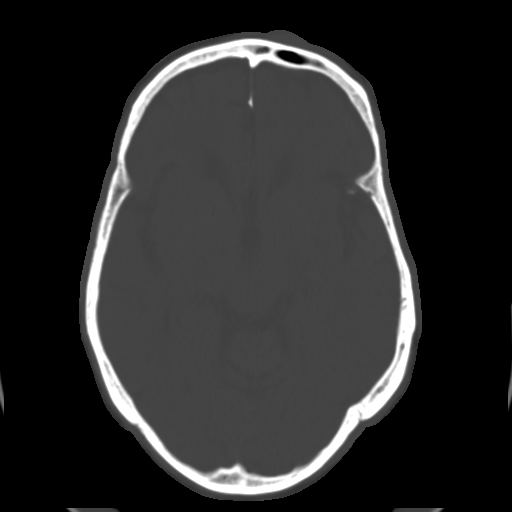
[im 23/40  brain]
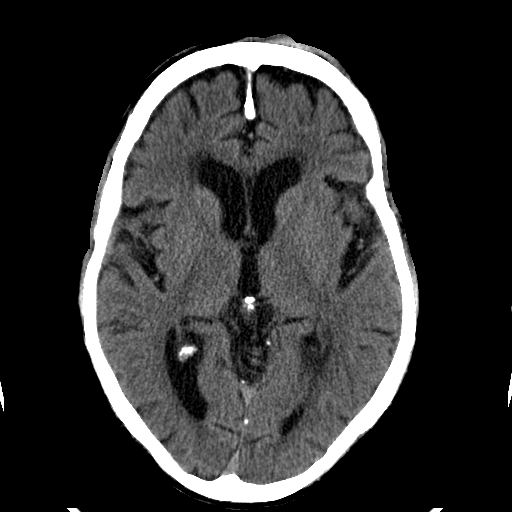
[im 26/40  brain]
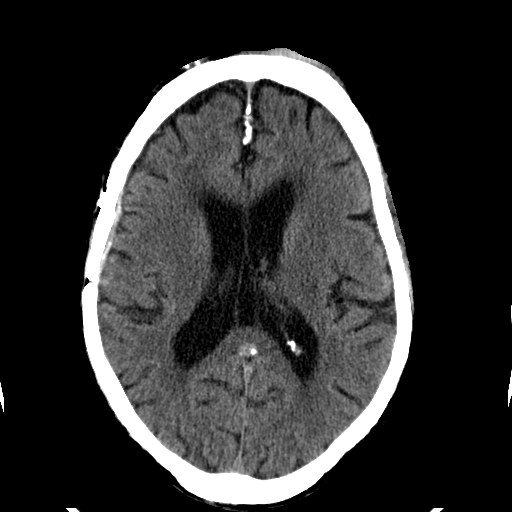
[im 29/40  brain]
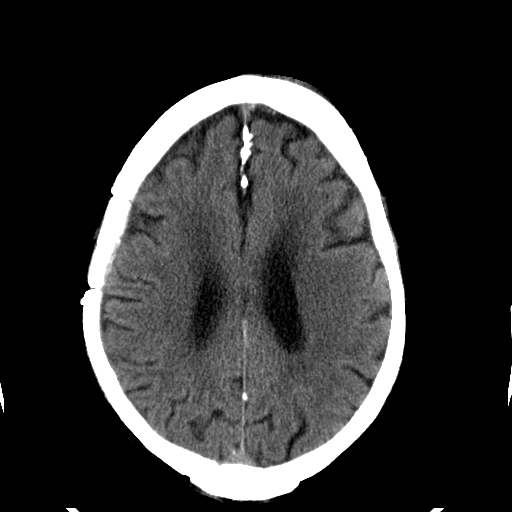
[im 30/40  brain]
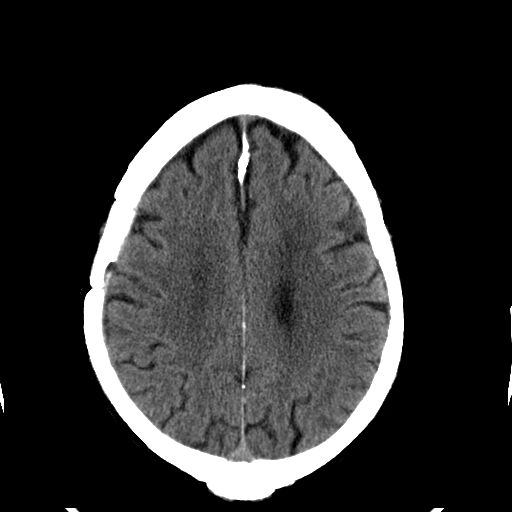
[im 30/40  bone]
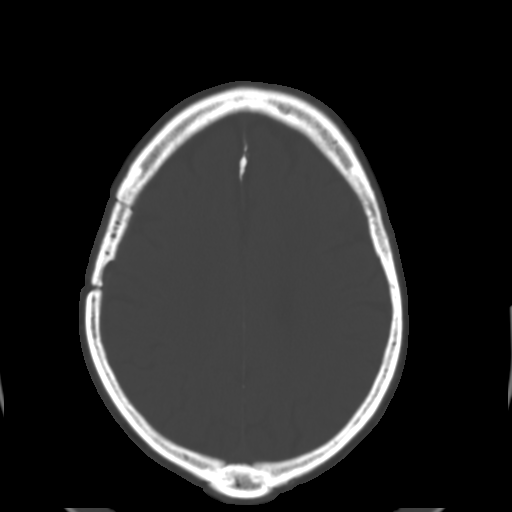
[im 33/40  brain]
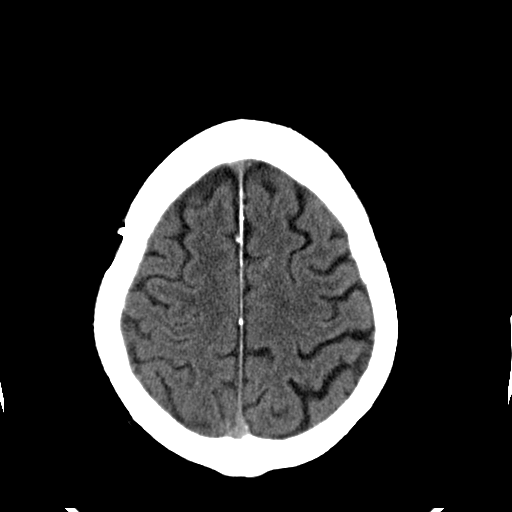
[im 35/40  brain]
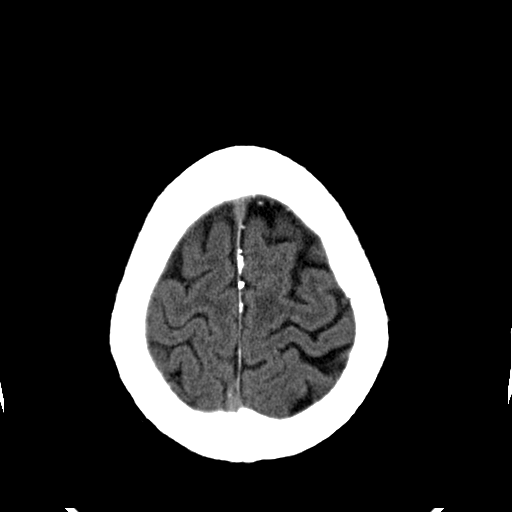
[im 38/40  brain]
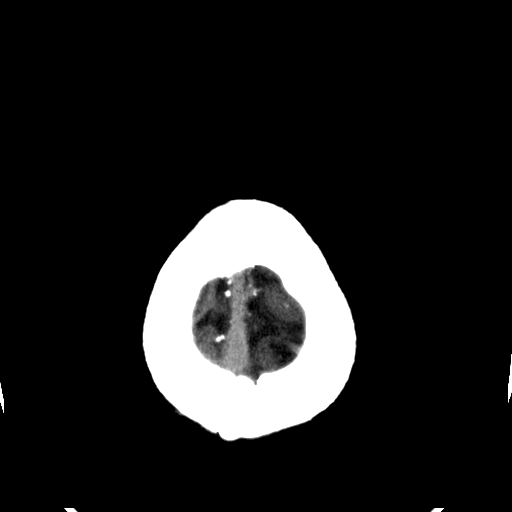

[16 of 30 positions shown; findings below may reference images not displayed]

FINDINGS: There is a scalp hematoma overlying the left frontal bone.
 There is mild diffuse low attenuation with the subcortical and periventricular white matter consistent with chronic small vessel ischemic disease.  There is prominence of the sulci and ventricles consistent with atrophy.  There are postoperative changes overlying the right parietal lobe consistent with craniotomy and evacuation of subdural hematoma.  No acute intracranial hemorrhage is noted.
IMPRESSION: 1.  Acute scalp hematoma overlying the left frontal bone.  No underlying fractures.  
 2.  Postoperative changes from craniotomy involving the right parietal bone and underlying dura.  No acute intracranial abnormality.

## 2008-11-19 ENCOUNTER — Encounter: Admission: RE | Admit: 2008-11-19 | Discharge: 2008-11-19 | Payer: Self-pay | Admitting: Internal Medicine

## 2008-11-19 IMAGING — CR DG CHEST 2V
2 series · 2 of 2 positions shown · non-contrast
Comparison: [DATE]

CLINICAL DATA: Weight loss and tobacco abuse

CHEST - 2 VIEW

[w chest pa]
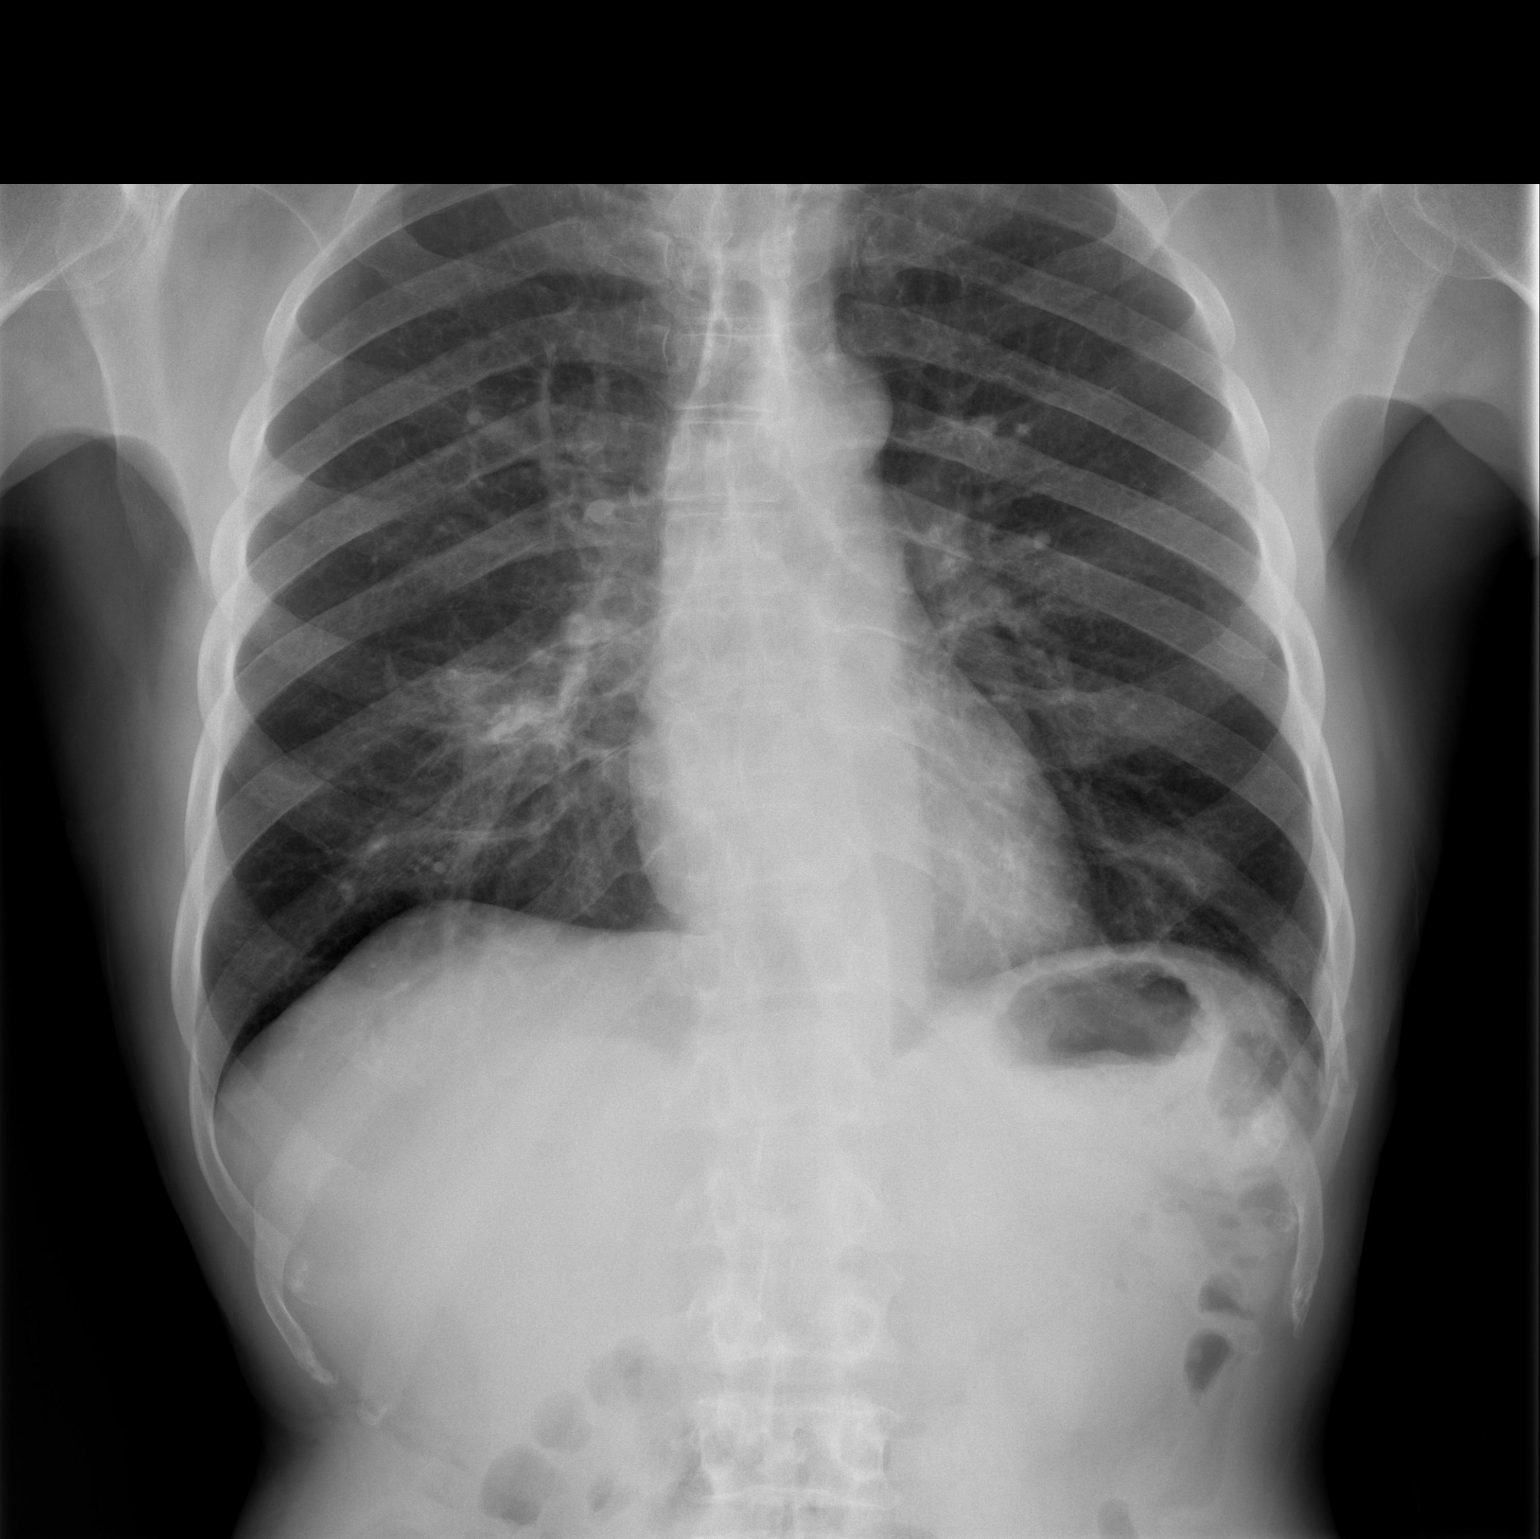

[w chest lat]
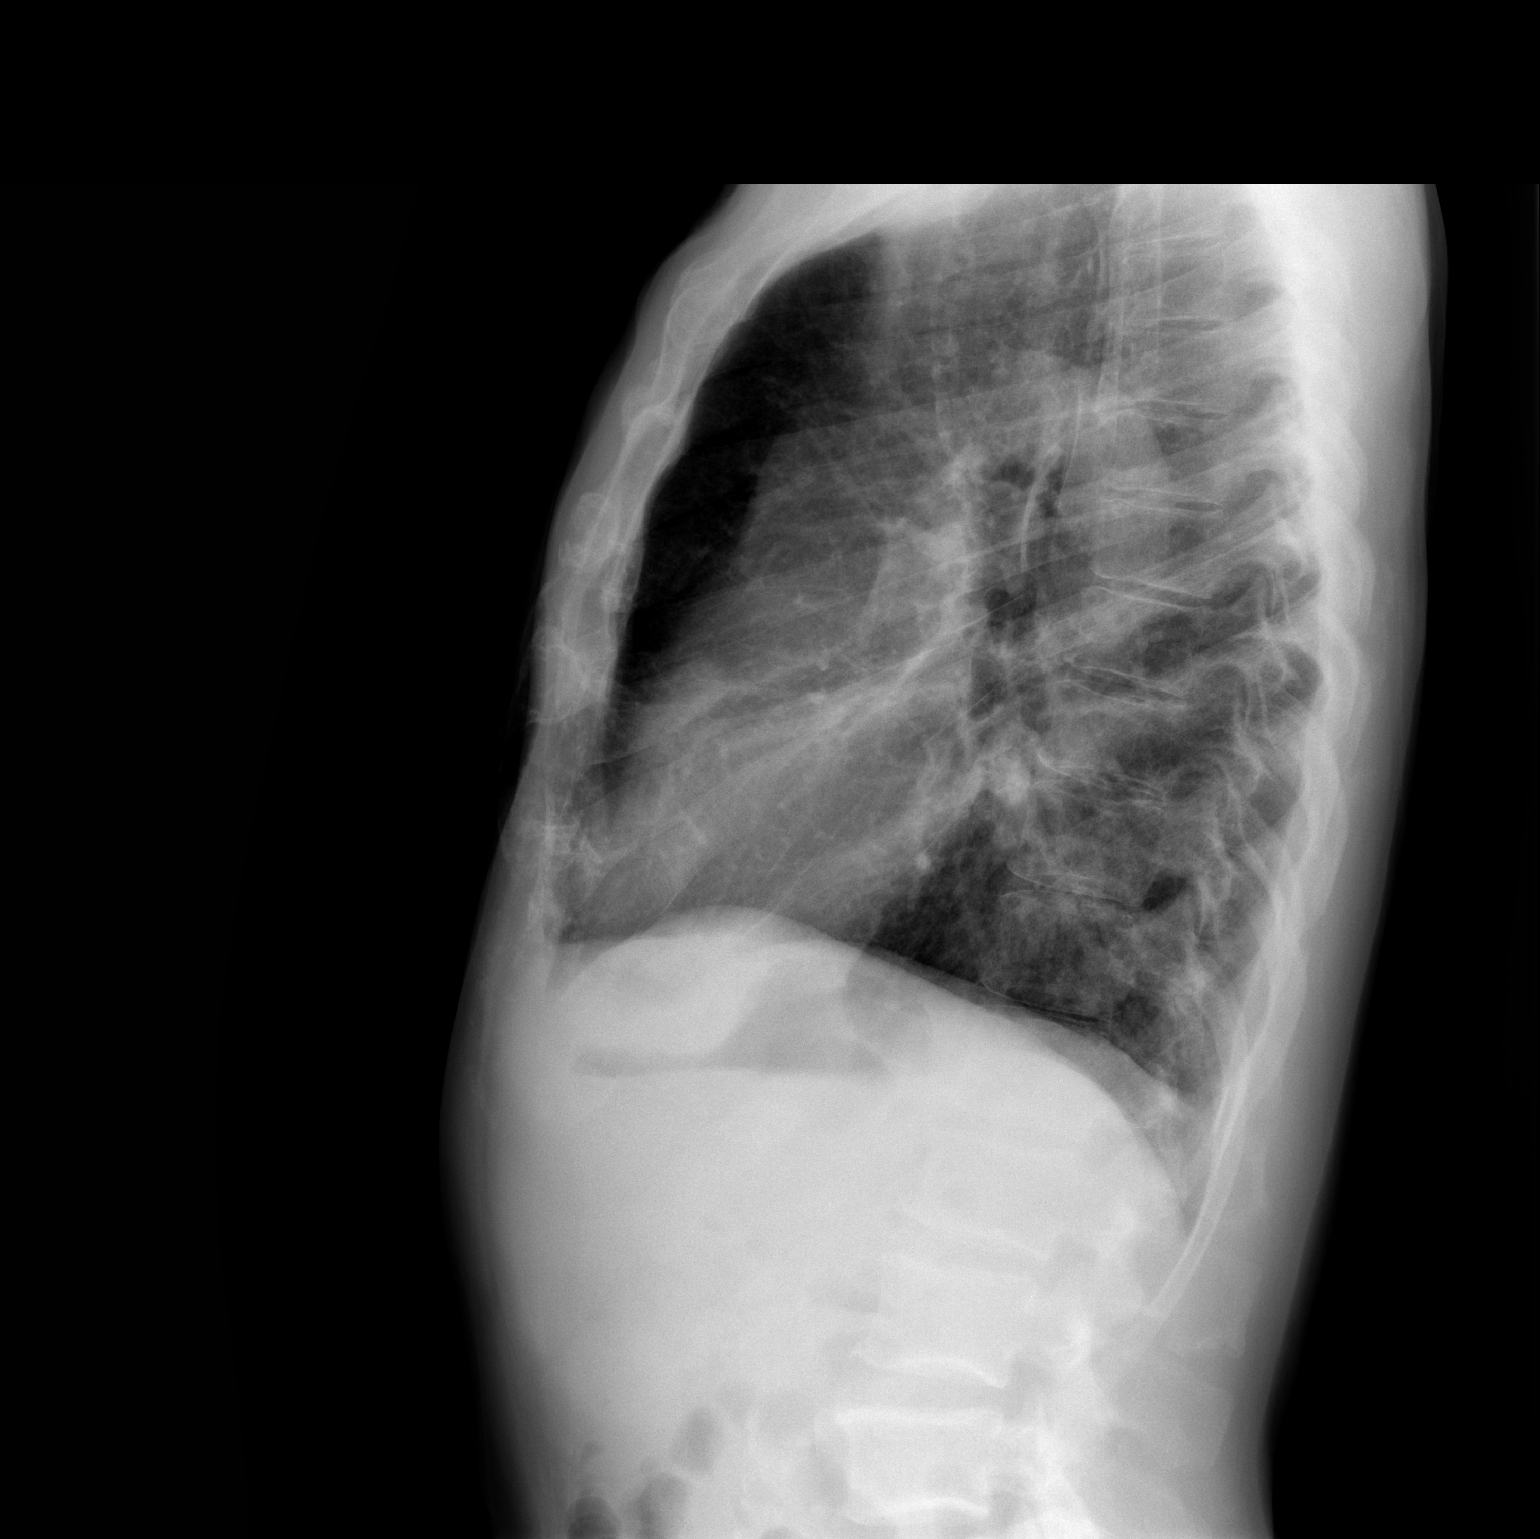

[2 of 2 positions shown; findings below may reference images not displayed]

FINDINGS: The heart size is normal.

No pleural effusion or pulmonary edema.

There is a right infrahilar density which is nonspecific.  This is
new from previous exam.

The left lung is clear.
IMPRESSION: 1.  Nonspecific right infrahilar density.  Recommend further
evaluation with noncontrast CT the chest.

## 2008-12-19 ENCOUNTER — Encounter: Admission: RE | Admit: 2008-12-19 | Discharge: 2008-12-19 | Payer: Self-pay | Admitting: Internal Medicine

## 2008-12-19 IMAGING — CT CT CHEST W/O CM
2 of 3 series · 15 of 30 positions shown, 17 images · non-contrast
Comparison: None

CLINICAL DATA: Abnormal chest x-ray.

CT CHEST WITHOUT CONTRAST
TECHNIQUE: Multidetector CT imaging of the chest was performed
following the standard protocol without IV contrast.

[Series 3: routine chest · axial · 0.66mm/px · z∈[-305,-80]mm · 7 of 61 slices shown, 9 images]
[im 8/61  mediastinal]
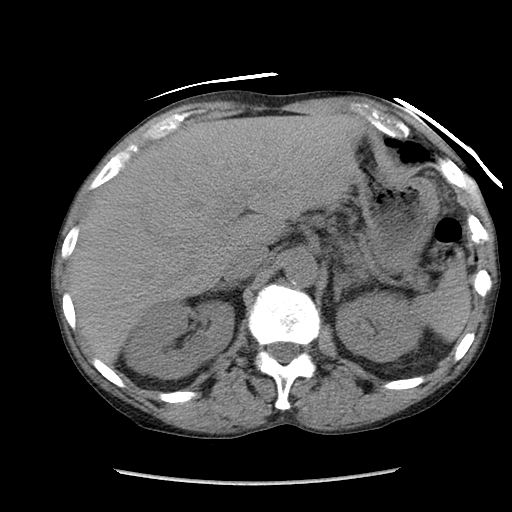
[im 8/61  lung]
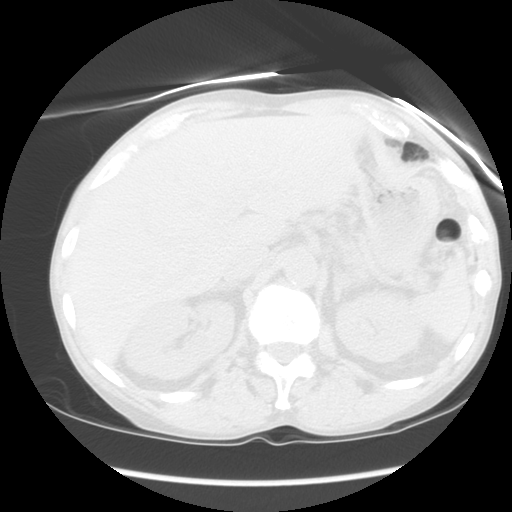
[im 16/61  lung]
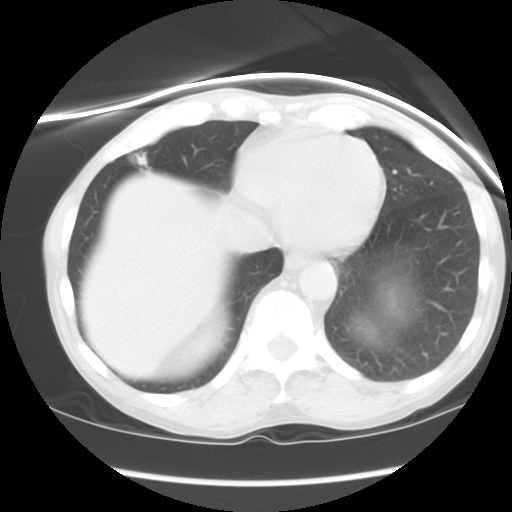
[im 23/61  lung]
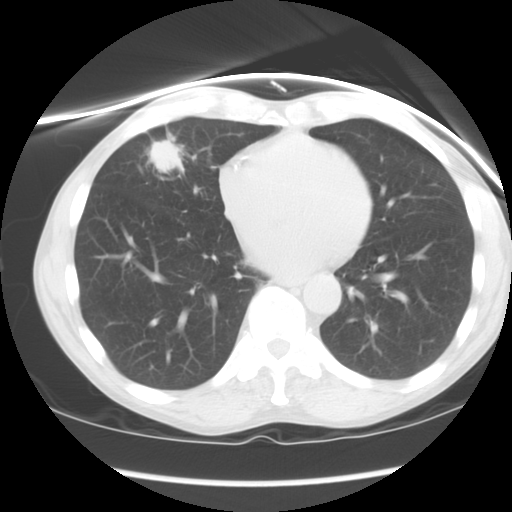
[im 31/61  lung]
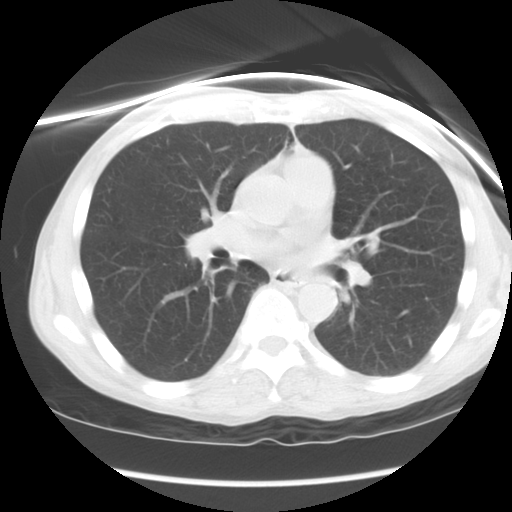
[im 38/61  mediastinal]
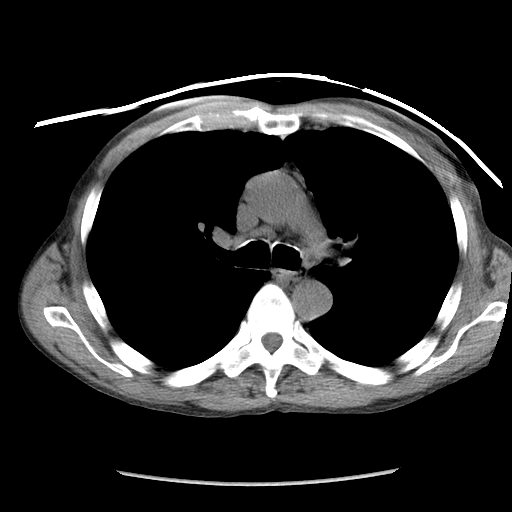
[im 38/61  lung]
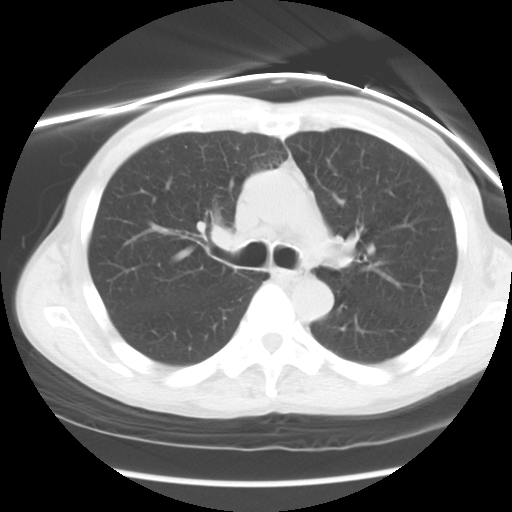
[im 46/61  lung]
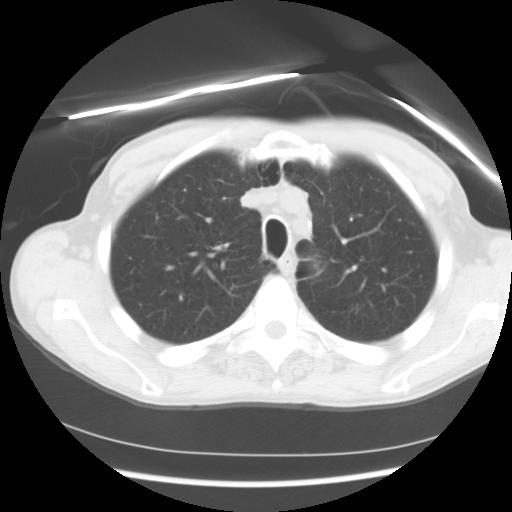
[im 53/61  lung]
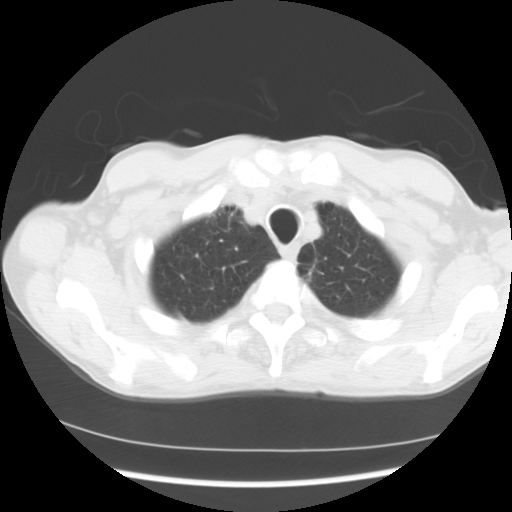

[Series 602: sagittal body · sagittal · 0.66mm/px · 8 of 137 slices shown]
[im 16/137  mediastinal]
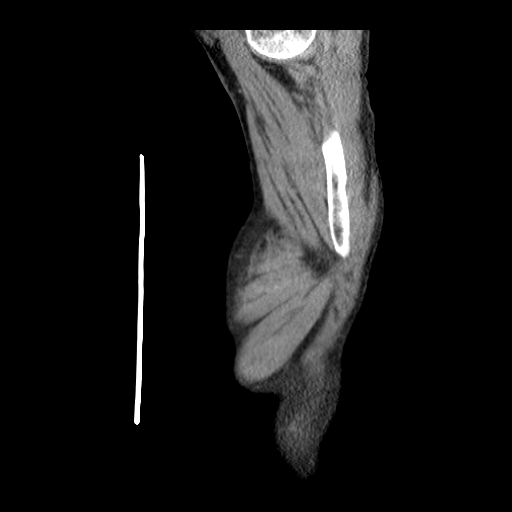
[im 31/137  mediastinal]
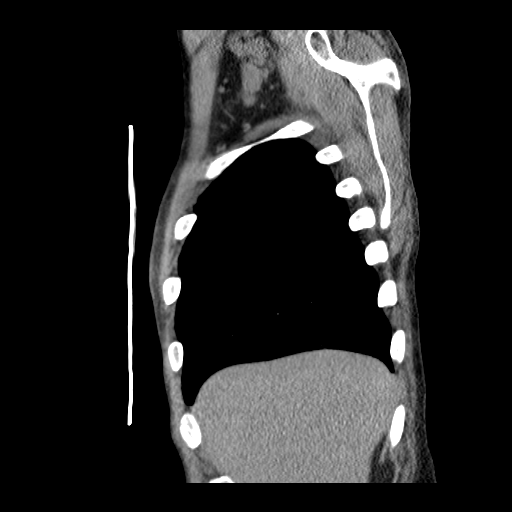
[im 46/137  mediastinal]
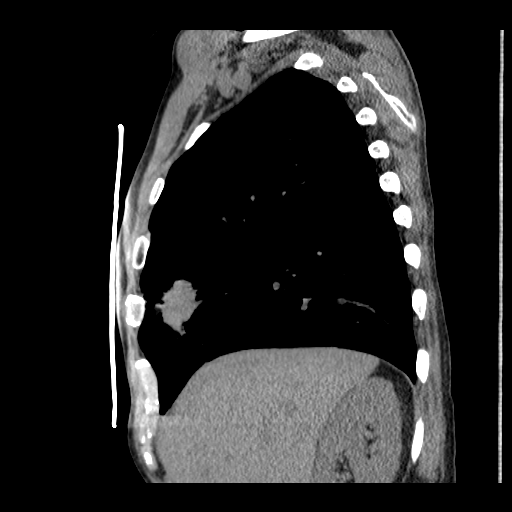
[im 61/137  mediastinal]
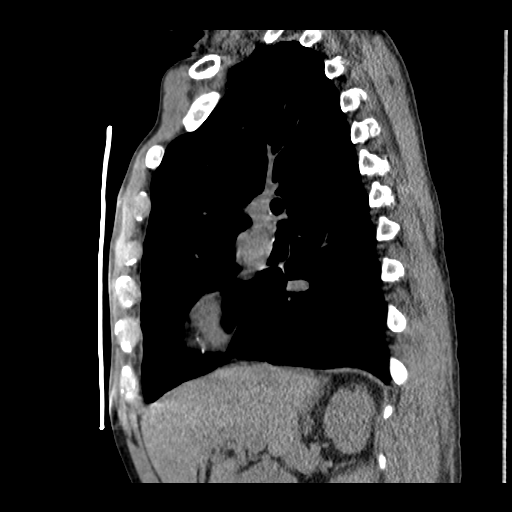
[im 76/137  mediastinal]
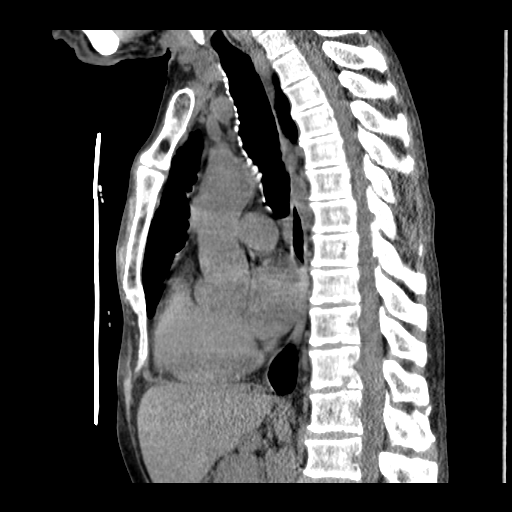
[im 91/137  mediastinal]
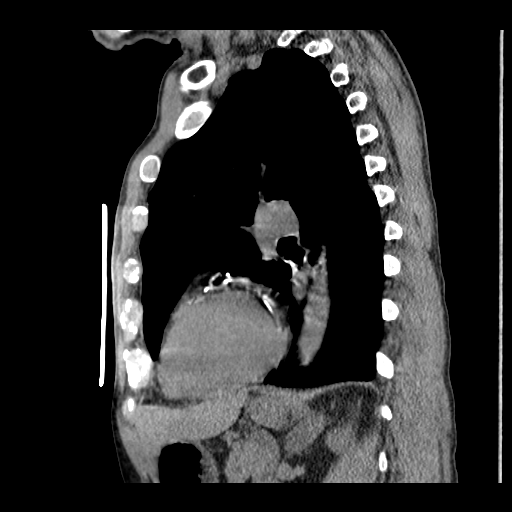
[im 106/137  mediastinal]
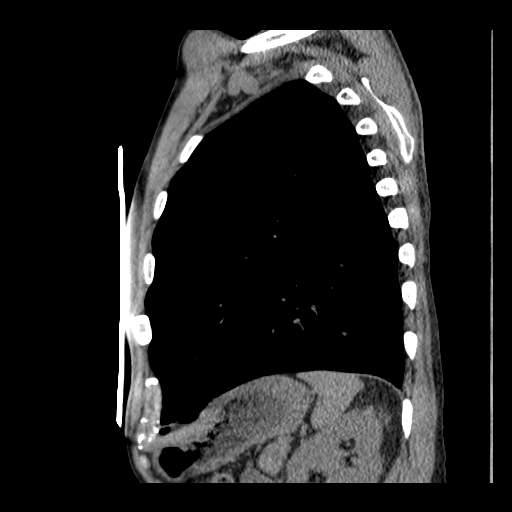
[im 121/137  mediastinal]
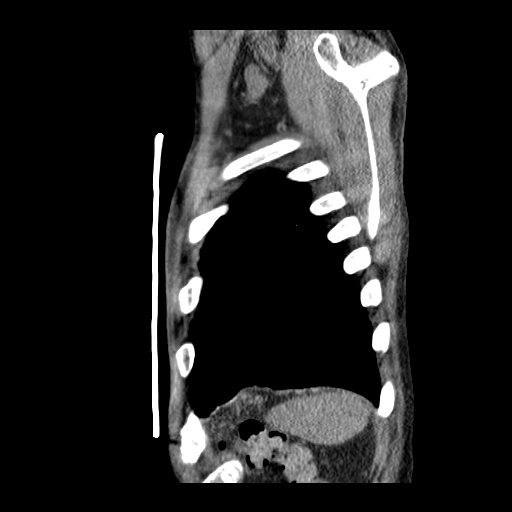

[15 of 30 positions shown; findings below may reference images not displayed]

FINDINGS: There is a spiculated mass within the right middle lobe,
corresponding to the density seen on chest x-ray.  This measures
3.7 cm in craniocaudal length, 2.8 cm transversely, and 2.7 cm
anterior-posterior.  Small extensions to the anterior pleural
surface.  This is most compatible with primary bronchogenic
carcinoma.

Small mediastinal lymph nodes, none pathologically enlarged.  No
definite hilar adenopathy on this unenhanced scan.  No axillary
adenopathy.

Heart is normal size.  Aorta is normal caliber.  Dense coronary
artery calcifications are present.

Visualized thyroid and chest wall soft tissues unremarkable.
Imaging into the upper abdomen shows no acute findings.  .

No acute bony abnormality.
IMPRESSION: The spiculated mass in the right middle lobe measuring up to
cm.  Findings most compatible with primary bronchogenic carcinoma.

These results were called to Dr. MINAXI at the time of
interpretation.

## 2009-01-03 ENCOUNTER — Ambulatory Visit: Payer: Self-pay | Admitting: Internal Medicine

## 2009-01-03 DIAGNOSIS — R222 Localized swelling, mass and lump, trunk: Secondary | ICD-10-CM | POA: Insufficient documentation

## 2009-01-03 DIAGNOSIS — J4489 Other specified chronic obstructive pulmonary disease: Secondary | ICD-10-CM | POA: Insufficient documentation

## 2009-01-03 DIAGNOSIS — R0989 Other specified symptoms and signs involving the circulatory and respiratory systems: Secondary | ICD-10-CM

## 2009-01-03 DIAGNOSIS — R0609 Other forms of dyspnea: Secondary | ICD-10-CM

## 2009-01-03 DIAGNOSIS — J449 Chronic obstructive pulmonary disease, unspecified: Secondary | ICD-10-CM

## 2009-01-09 ENCOUNTER — Ambulatory Visit (HOSPITAL_COMMUNITY): Admission: RE | Admit: 2009-01-09 | Discharge: 2009-01-09 | Payer: Self-pay | Admitting: Internal Medicine

## 2009-01-09 ENCOUNTER — Encounter: Payer: Self-pay | Admitting: Internal Medicine

## 2009-01-09 IMAGING — CT NM PET TUM IMG INITIAL (PI) SKULL BASE T - THIGH
6 series · 25 of 25 positions shown · IV contrast ([ID])
Comparison: None

CLINICAL DATA: Lung tumor

NUCLEAR MEDICINE BRAIN PET CT
TECHNIQUE: 15.4 mCi F-18 FDG was injected intravenously via the
right forearm.  Full-ring PET imaging was performed from the vertex
to the skull base right forearm minutes after injection.  CT data
was obtained and used for attenuation correction and anatomic
localization only.  (This was not acquired as a diagnostic CT
examination.)

[Series 1: pet ac · axial · 3.3mm · 4.69mm/px · z∈[-870,+0]mm · 5 of 267 slices shown]
[im 1/267]
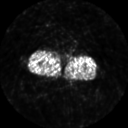
[im 67/267]
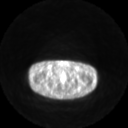
[im 134/267]
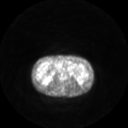
[im 200/267]
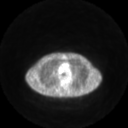
[im 267/267]
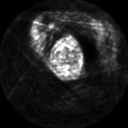

[Series 2: ct images · axial · 3.8mm · 0.98mm/px · z∈[-870,+0]mm · 5 of 267 slices shown]
[im 1/267]
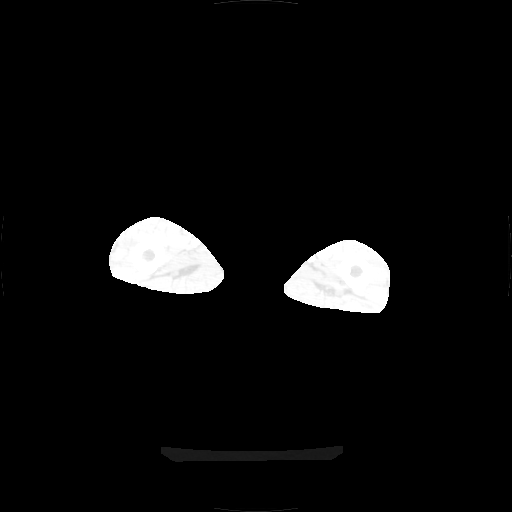
[im 67/267]
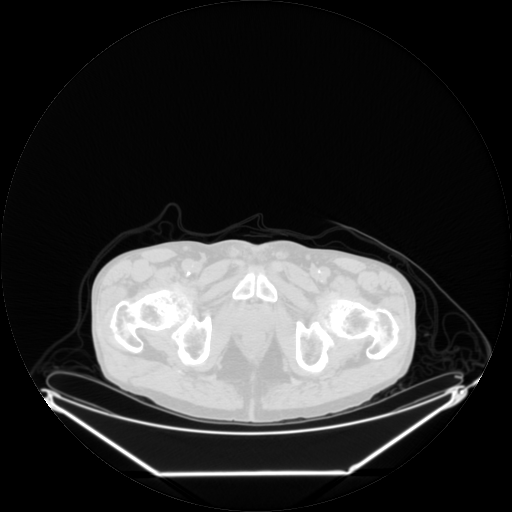
[im 134/267]
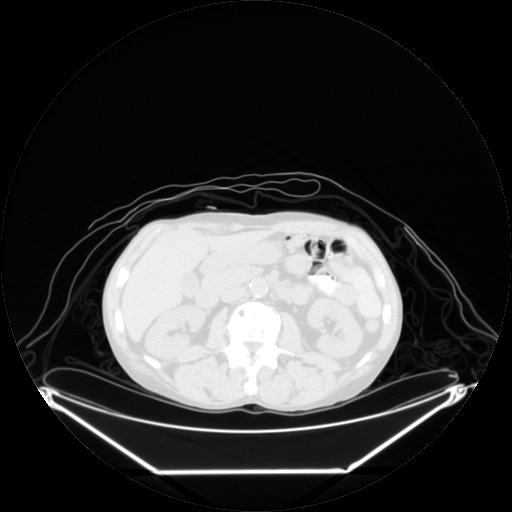
[im 200/267]
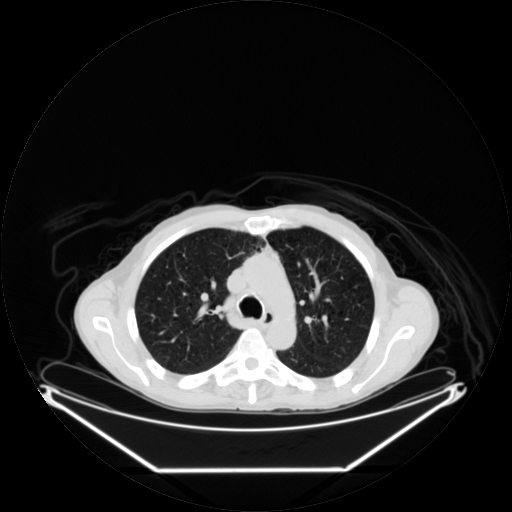
[im 267/267]
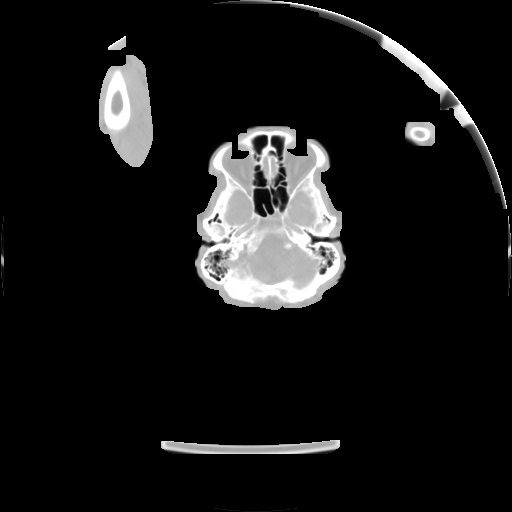

[Series 2: pet nac · axial · 3.3mm · 4.69mm/px · z∈[-870,+0]mm · 6 of 267 slices shown]
[im 1/267]
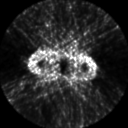
[im 54/267]
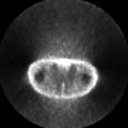
[im 107/267]
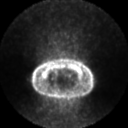
[im 160/267]
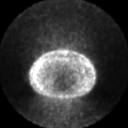
[im 213/267]
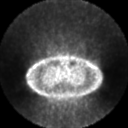
[im 267/267]
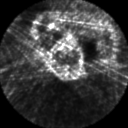

[Series 123: mip · coronal · 3.3mm · 4.69mm/px · 1 of 29 slices shown]
[im 1/29]
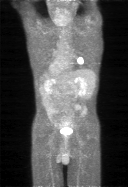

[Series 151: reformatted · axial · 3.3mm · 3.91mm/px · z∈[-870,+0]mm · 6 of 267 slices shown (1 of 2)]
[im 1/267]
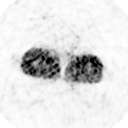
[im 54/267]
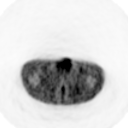
[im 107/267]
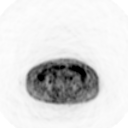
[im 160/267]
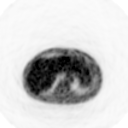
[im 213/267]
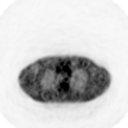
[im 267/267]
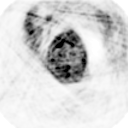

[Series 153: reformatted · coronal · 4.7mm · 6.98mm/px · 2 of 78 slices shown (2 of 2)]
[im 1/78]
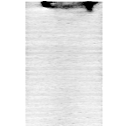
[im 78/78]
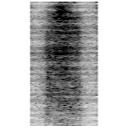

[25 of 25 positions shown; findings below may reference images not displayed]

FINDINGS: No hypermetabolic lymph nodes within the soft tissues of
the neck.

There is no hypermetabolic axillary or supraclavicular lymph nodes.

Prevascular lymph node is identified within the superior
mediastinum. This is situated posterior to the left innominate vein
and just above the aortic arch.  This has an SUV max equal to 3.6.

No additional hypermetabolic mediastinal or hilar lymph nodes.

Right upper lobe mass measures 3.2 cm and has an SUV max equal to
18.4.

No additional pulmonary nodules or masses identified.

No abnormal uptake within the liver parenchyma.

The adrenal glands are negative.

Spleen is negative.

The pancreas is negative.

There is no enlarged upper abdominal lymph nodes.

There is no hypermetabolic pelvic or inguinal lymph nodes.

Review of the visualized osseous structures shows no evidence for
hypermetabolic bone metastases.
IMPRESSION: 1.  Mass within the right upper lobe is intensely hypermetabolic
consistent with primary lung neoplasm.
2.  Nonspecific increased FDG uptake within sub centimeter
prevascular lymph node. The degree of FDG uptake is just above
blood pool activity.

3.  No evidence for metastatic disease to the abdomen or pelvis.

## 2009-01-17 ENCOUNTER — Telehealth (INDEPENDENT_AMBULATORY_CARE_PROVIDER_SITE_OTHER): Payer: Self-pay | Admitting: *Deleted

## 2009-01-21 ENCOUNTER — Encounter: Payer: Self-pay | Admitting: Internal Medicine

## 2009-01-21 ENCOUNTER — Ambulatory Visit: Payer: Self-pay | Admitting: Thoracic Surgery

## 2009-02-05 IMAGING — CR DG CHEST 2V
2 series · 2 of 2 positions shown · non-contrast
Comparison: CT chest [DATE] and two-view chest x-ray
[DATE].

CLINICAL DATA: Preoperative respiratory evaluation prior to
resection of right middle lobe mass.

CHEST - 2 VIEW [DATE]:

[view not recorded (1 of 2)]
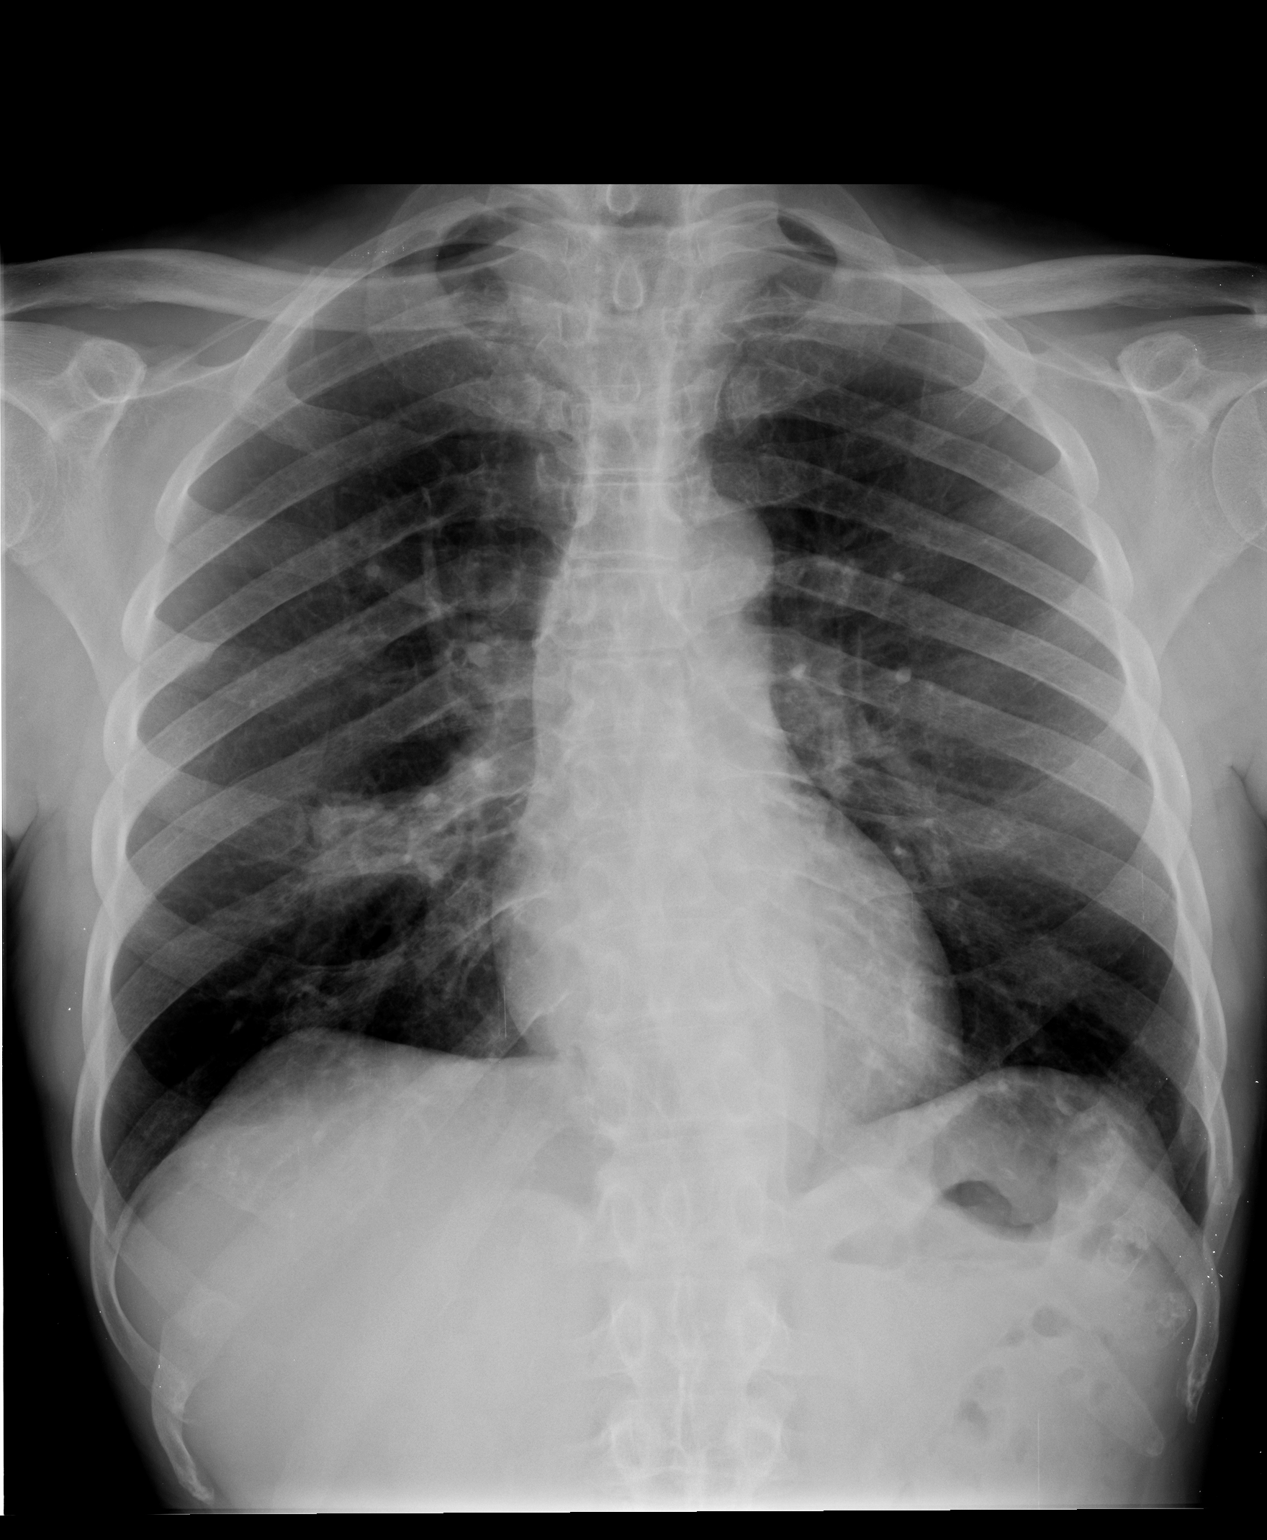

[view not recorded (2 of 2)]
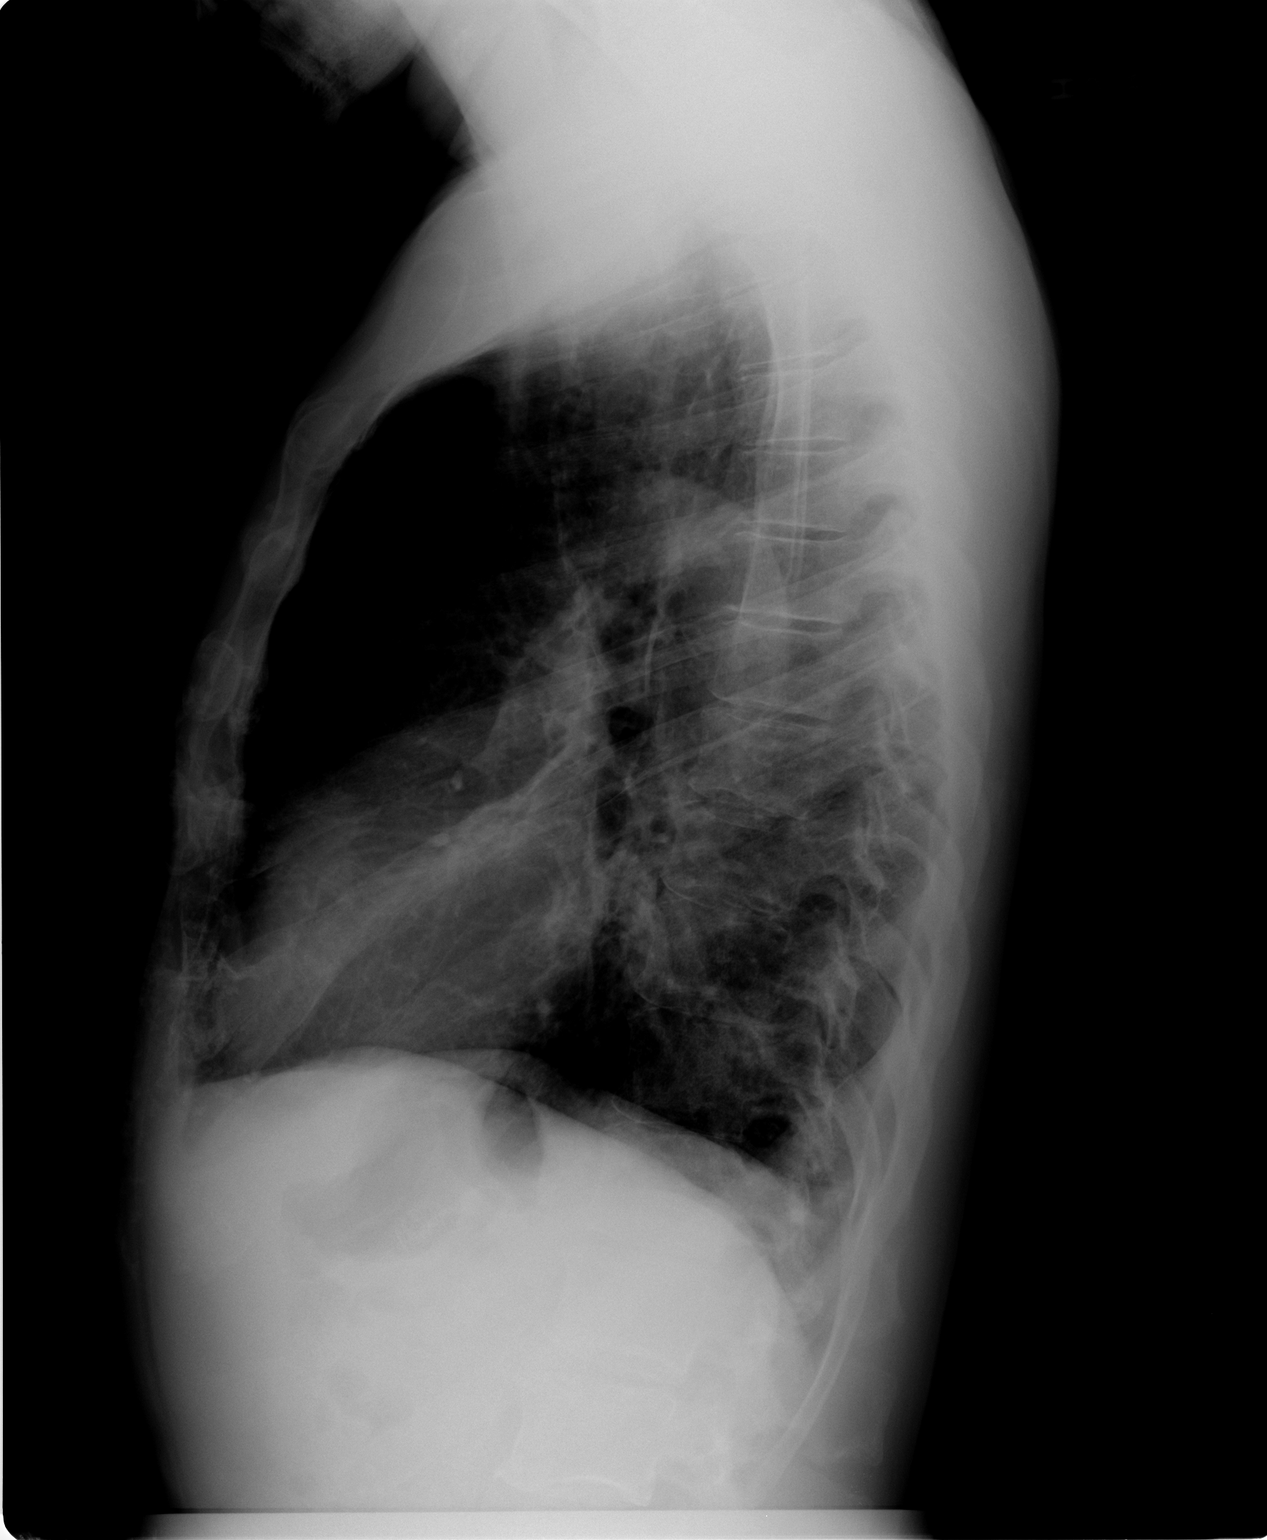

[2 of 2 positions shown; findings below may reference images not displayed]

FINDINGS: Mass within the right middle lobe as noted on the
previous examinations.  Lungs otherwise clear.  Cardiomediastinal
silhouette unremarkable and unchanged.  No pleural effusions.
Visualized bony thorax intact.
IMPRESSION: Right middle lobe lung mass as noted previously.  No new
abnormalities.

## 2009-02-06 ENCOUNTER — Encounter: Payer: Self-pay | Admitting: Thoracic Surgery

## 2009-02-06 ENCOUNTER — Ambulatory Visit: Payer: Self-pay | Admitting: Thoracic Surgery

## 2009-02-06 ENCOUNTER — Inpatient Hospital Stay (HOSPITAL_COMMUNITY): Admission: RE | Admit: 2009-02-06 | Discharge: 2009-02-11 | Payer: Self-pay | Admitting: Thoracic Surgery

## 2009-02-06 IMAGING — CR DG CHEST 1V PORT
1 series · 1 of 1 positions shown · non-contrast
Comparison: Chest radiographs [DATE] and chest CT [DATE].

CLINICAL DATA: Postop thoracotomy for right lung mass.

PORTABLE CHEST - 1 VIEW

[view not recorded]
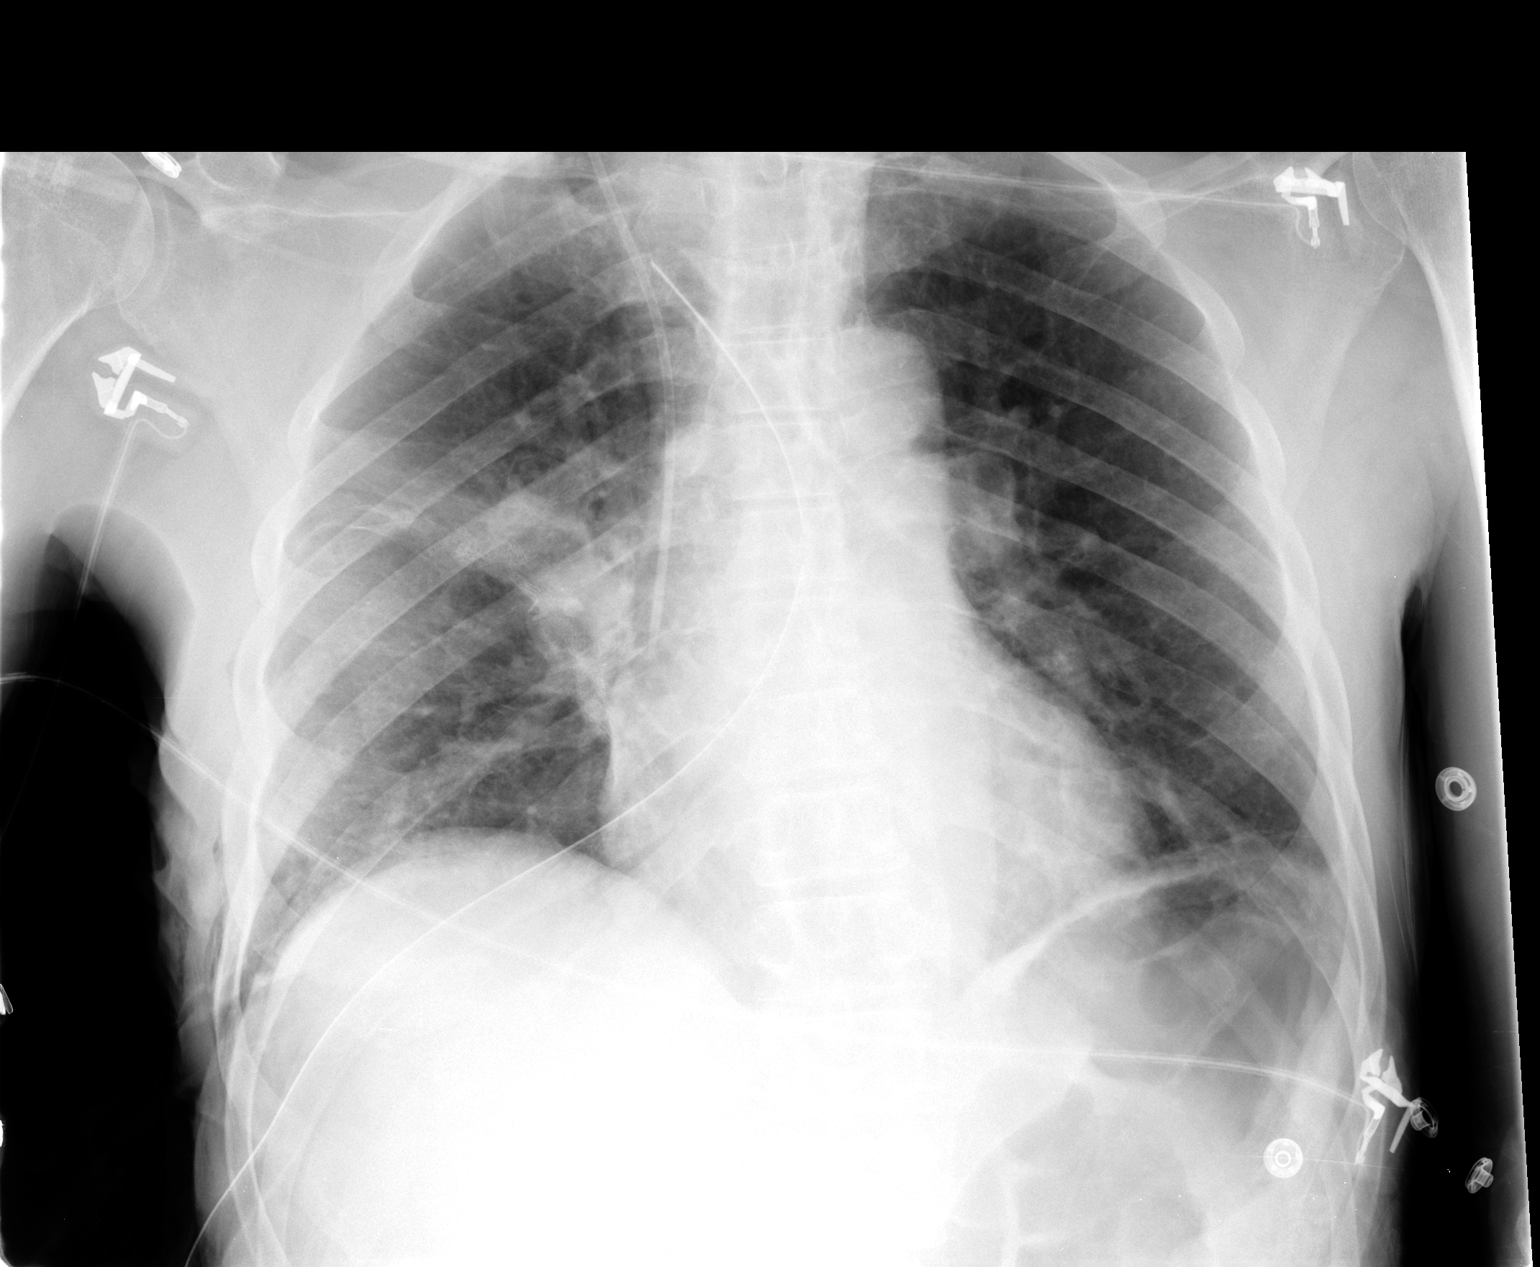

[1 of 1 positions shown; findings below may reference images not displayed]

FINDINGS: [AD] hours.  There has been interval right thoracotomy
with presumed right middle lobe resection.  Surgical clips overlie
the right hilum which is mildly enlarged, probably due to adjacent
contusion or atelectasis.  Some soft tissue emphysema in the right
chest wall likely accounts for lucency at the right costophrenic
angle.  No definite pneumothorax is seen.  The left lung is clear
aside from mild basilar atelectasis.

Right IJ central venous catheter tip is at the SVC right atrial
junction.  A right chest tube is in place.  The mediastinal
contours are otherwise stable.
IMPRESSION: 1.  Postop changes status post right middle lobe resection.  No
definite pneumothorax.
2.  Bibasilar and right perihilar atelectasis or contusion.

## 2009-02-07 IMAGING — CR DG CHEST 1V PORT
1 series · 1 of 1 positions shown · non-contrast
Comparison: [DATE]

CLINICAL DATA: Lung mass, postop

PORTABLE CHEST - 1 VIEW

[AP]
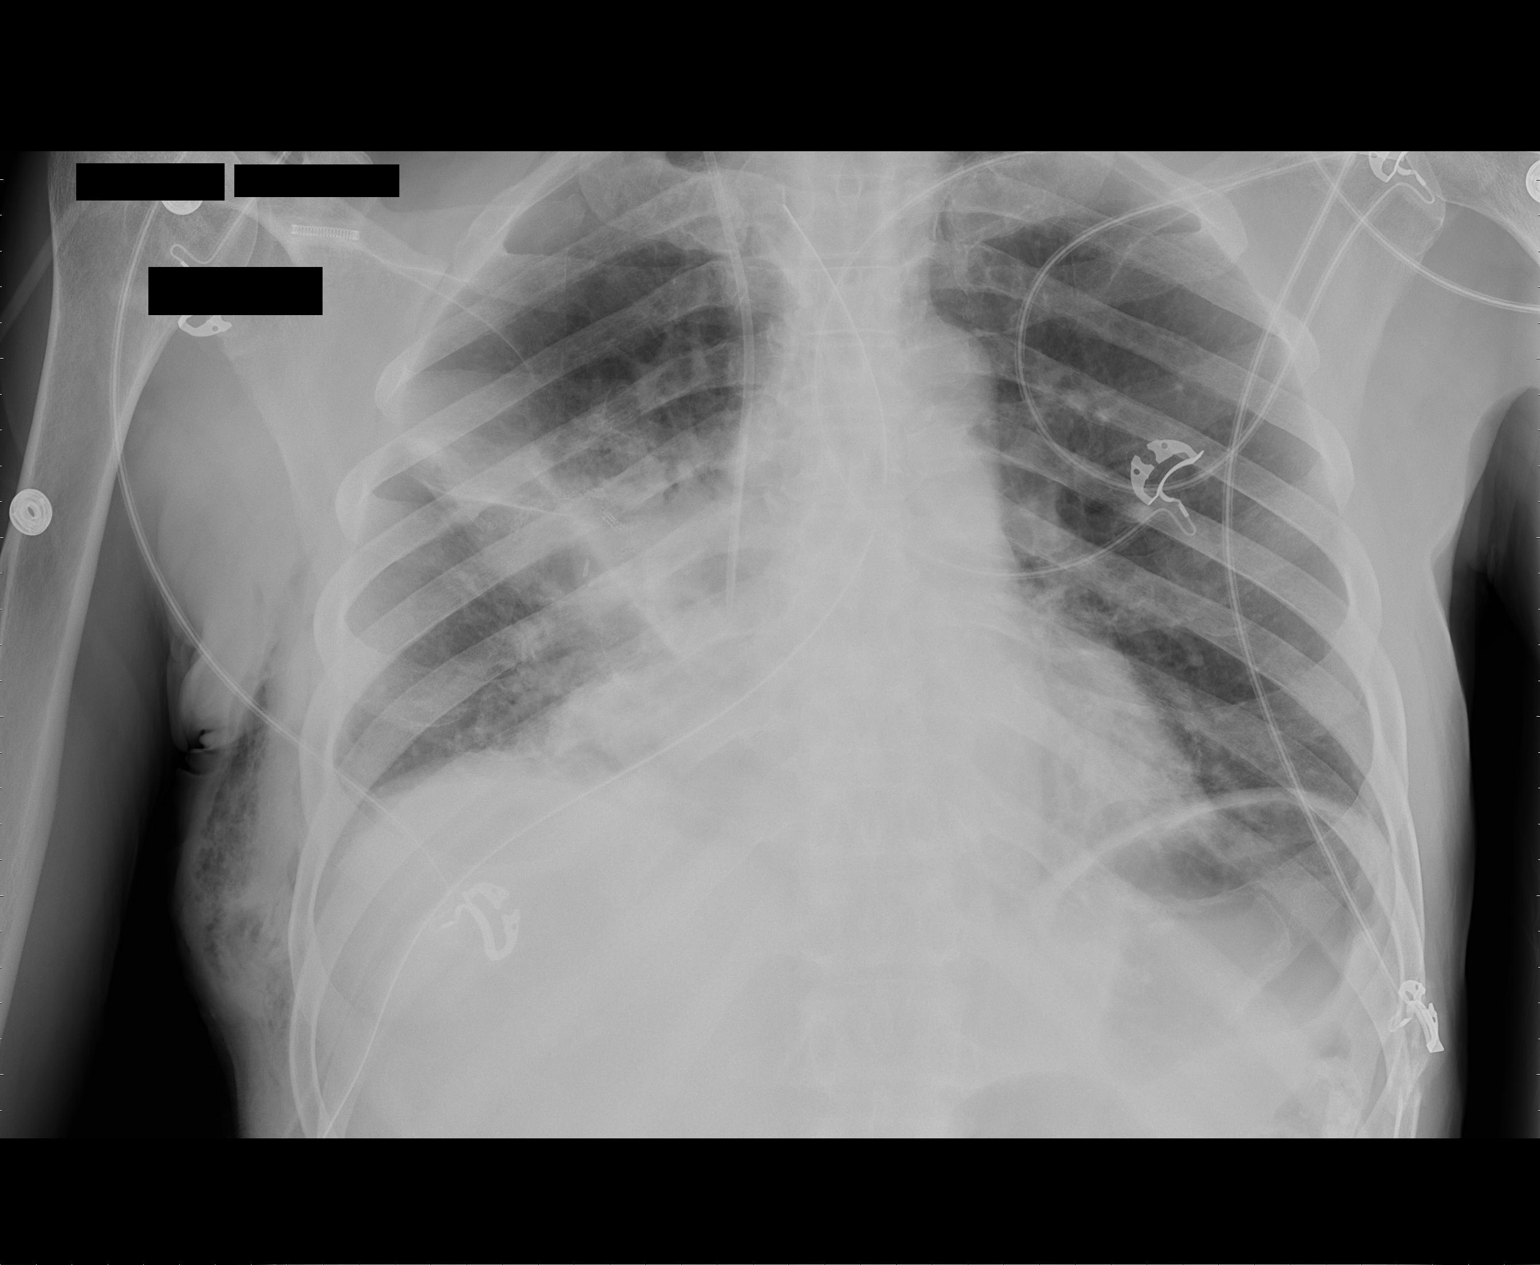

[1 of 1 positions shown; findings below may reference images not displayed]

FINDINGS: Right chest tube and right IJ central line are stable in
position.  Increased right perihilar atelectasis in the
postoperative region where there are surgical sutures and clips.
No definite pneumothorax.  Left lung is clear.  Normal heart size
and vascularity.
IMPRESSION: No pneumothorax
Increased right perihilar atelectasis / consolidation
No large effusion

## 2009-02-08 IMAGING — CR DG CHEST 1V PORT
1 series · 1 of 1 positions shown · non-contrast
Comparison: [DATE]

CLINICAL DATA: Lung mass

PORTABLE CHEST - 1 VIEW

[view not recorded]
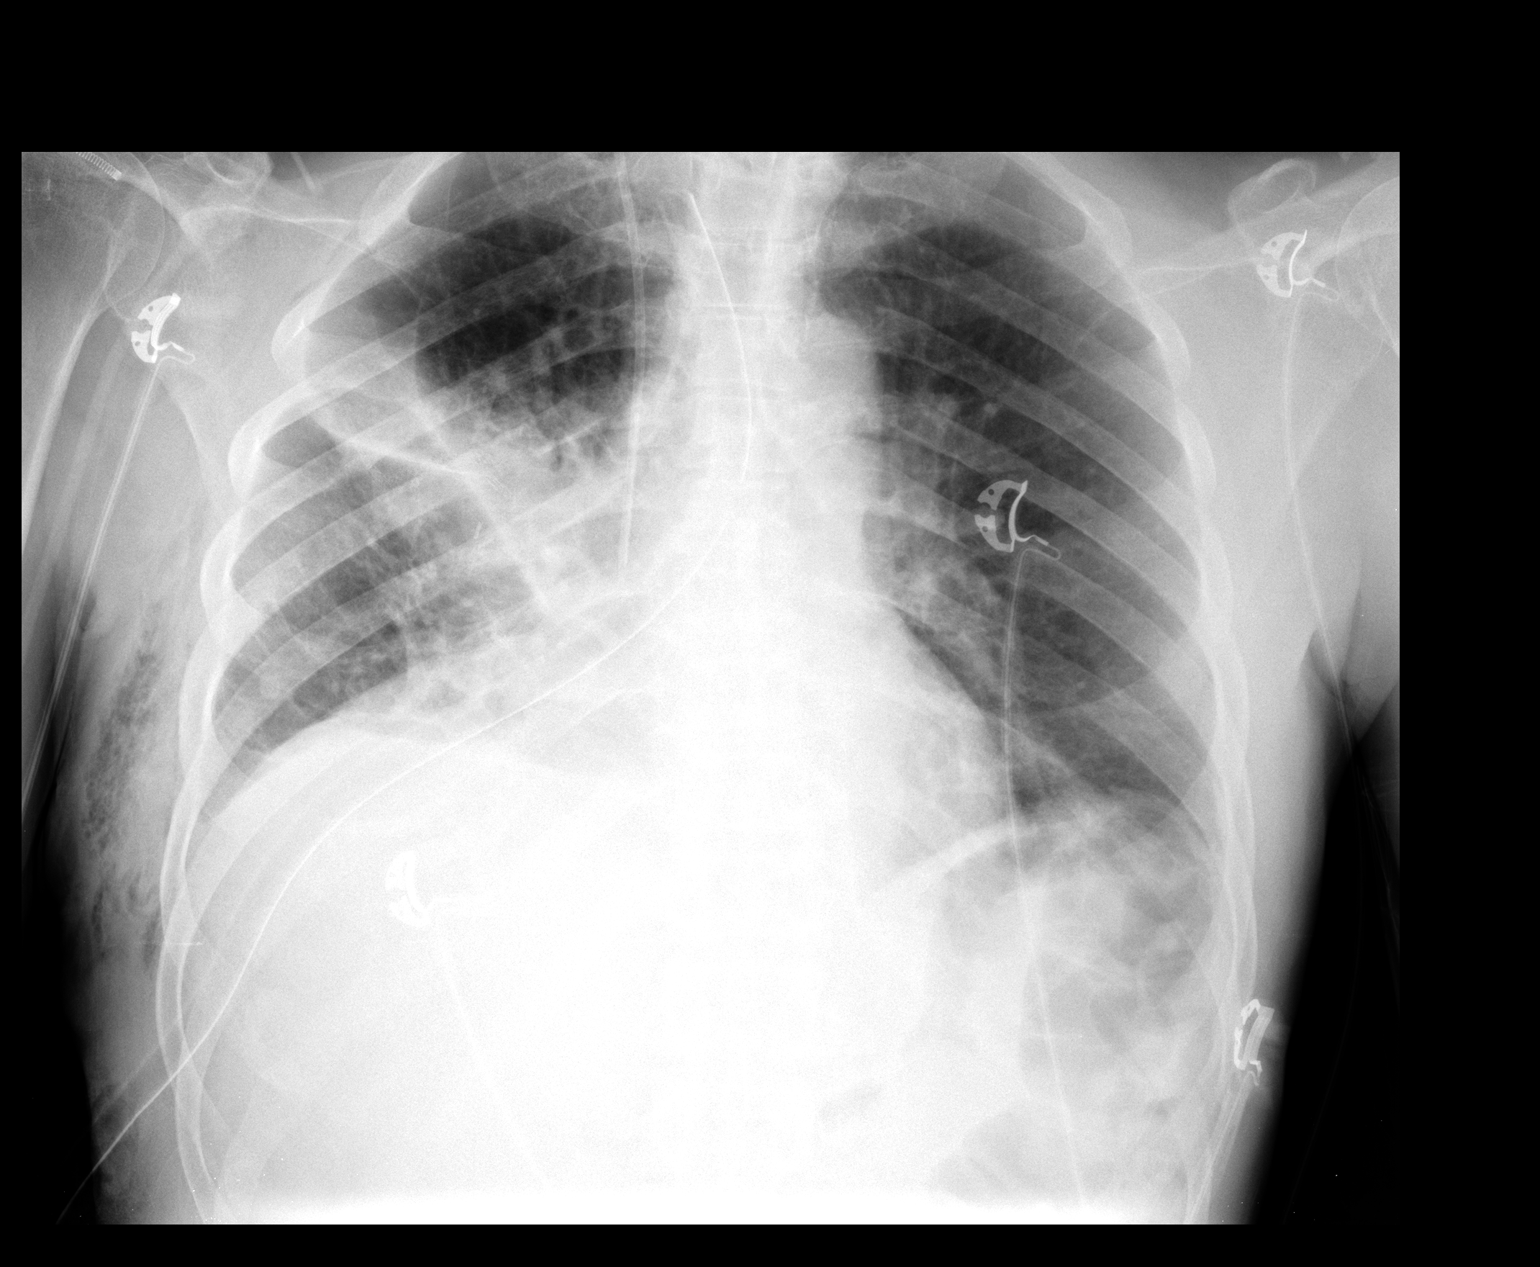

[1 of 1 positions shown; findings below may reference images not displayed]

FINDINGS: Right mid lung and basilar opacity are stable.  Right
chest tube is stable without pneumothorax.  Subcutaneous emphysema
over the chest wall is unchanged.  The right internal jugular
central venous catheter is unchanged.  The left lung remains clear.
IMPRESSION: Stable exam.  No pneumothorax.  Right lung opacities.

## 2009-02-09 IMAGING — CR DG CHEST 1V PORT
1 series · 1 of 1 positions shown · non-contrast
Comparison: [DATE] [DATE] hours

CLINICAL DATA: Chest tube removal

PORTABLE CHEST - 1 VIEW

[view not recorded]
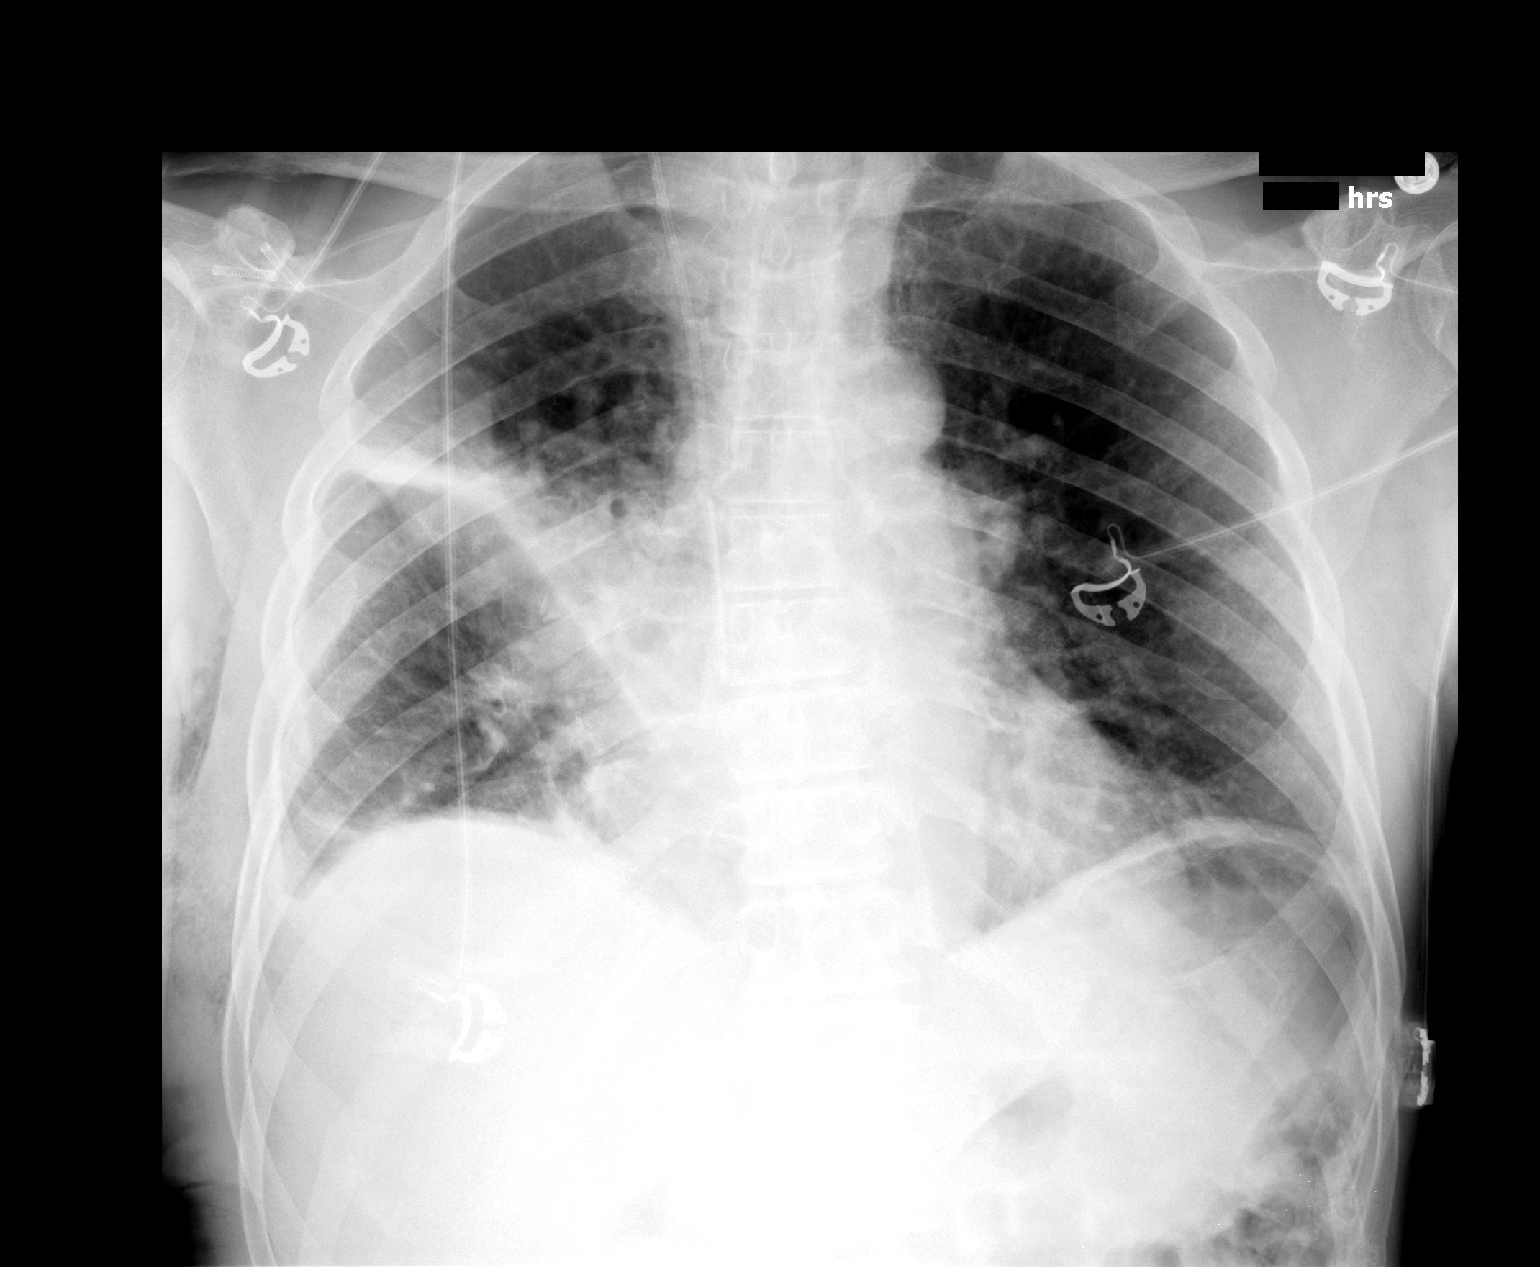

[1 of 1 positions shown; findings below may reference images not displayed]

FINDINGS: Right chest tube removed.  No pneumothorax.  Otherwise
stable.
IMPRESSION: Right chest tube removed without subsequent pneumothorax.

## 2009-02-09 IMAGING — CR DG CHEST 1V PORT
1 series · 1 of 1 positions shown · non-contrast
Comparison: Yesterday

CLINICAL DATA: Lung mass

PORTABLE CHEST - 1 VIEW

[view not recorded]
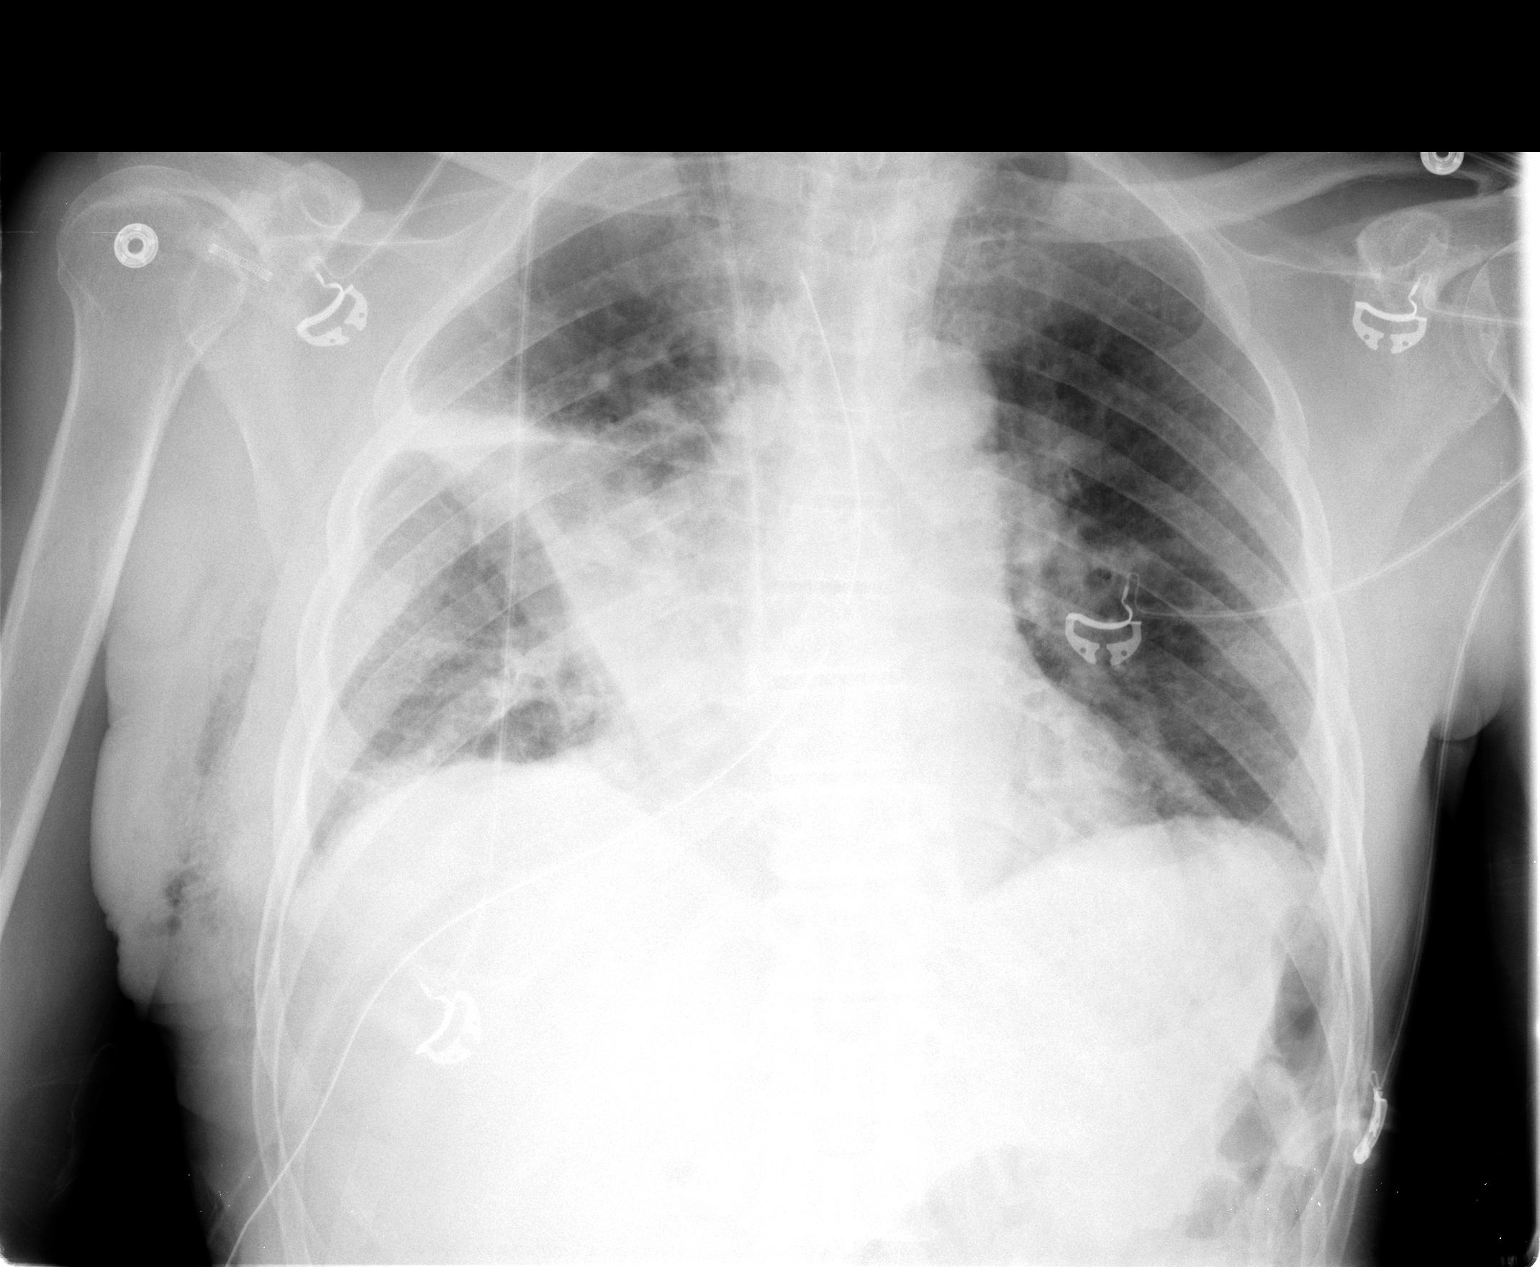

[1 of 1 positions shown; findings below may reference images not displayed]

FINDINGS: Right chest tube and right internal jugular central
venous catheter are stable.  No pneumothorax.  Stable subcutaneous
emphysema over the right chest.  Stable pleural and parenchymal
opacities in the right mid and lower lung zones.
IMPRESSION: No change.  Right lung opacities.  No pneumothorax.

## 2009-02-10 IMAGING — CR DG CHEST 1V PORT
1 series · 1 of 1 positions shown · non-contrast
Comparison: [DATE].

CLINICAL DATA: Status post lung surgery.

PORTABLE CHEST - 1 VIEW

[view not recorded]
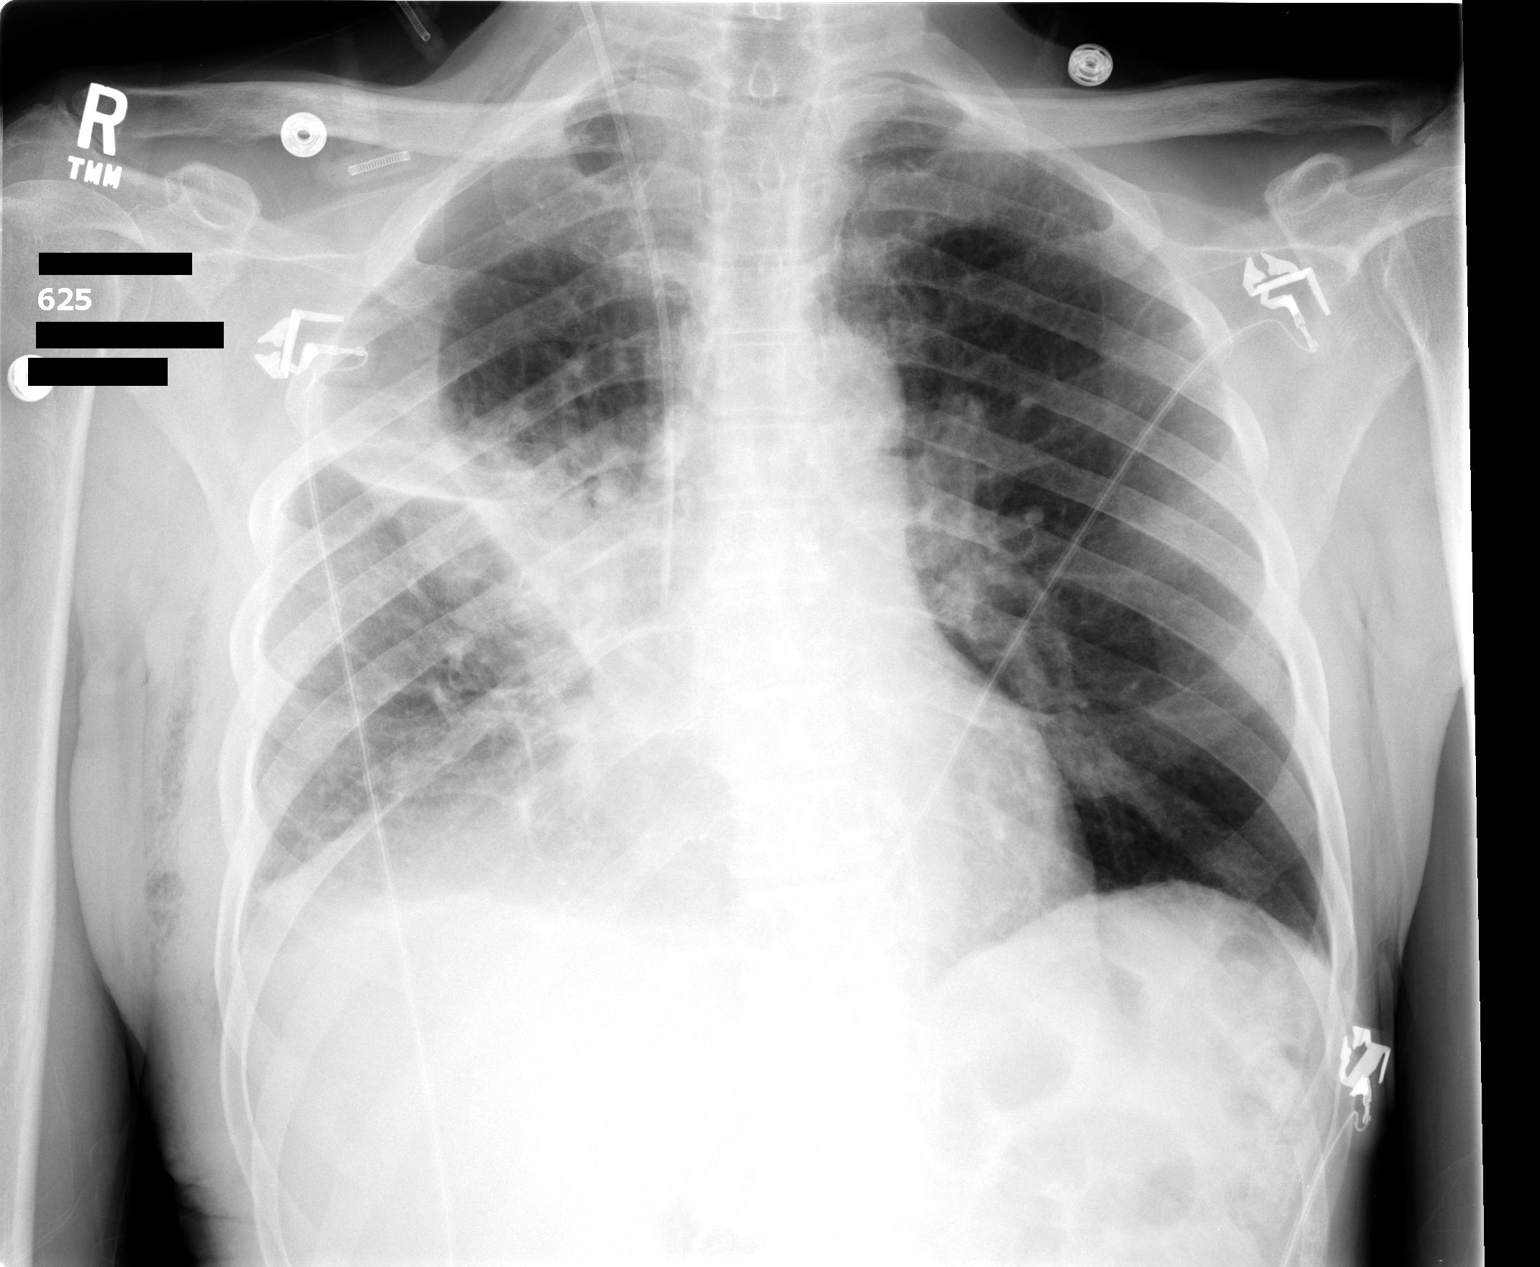

[1 of 1 positions shown; findings below may reference images not displayed]

FINDINGS: Right central line tip mid superior vena cava.
Consolidation right mid to upper lung zone may represent
combination of postoperative changes, atelectasis with pleural
fluid within the fissures.  A tiny right apical pneumothorax cannot
be excluded.  Small amount of subcutaneous air right chest wall
remains.  Heart size within normal limits.
IMPRESSION: Postoperative changes right lung as described above.

A tiny right apical pneumothorax cannot be excluded.

## 2009-02-11 IMAGING — CR DG CHEST 2V
2 series · 2 of 2 positions shown · non-contrast
Comparison: [DATE] and earlier.

CLINICAL DATA: 68-year-old male status post VATS.

CHEST - 2 VIEW

[w chest pa]
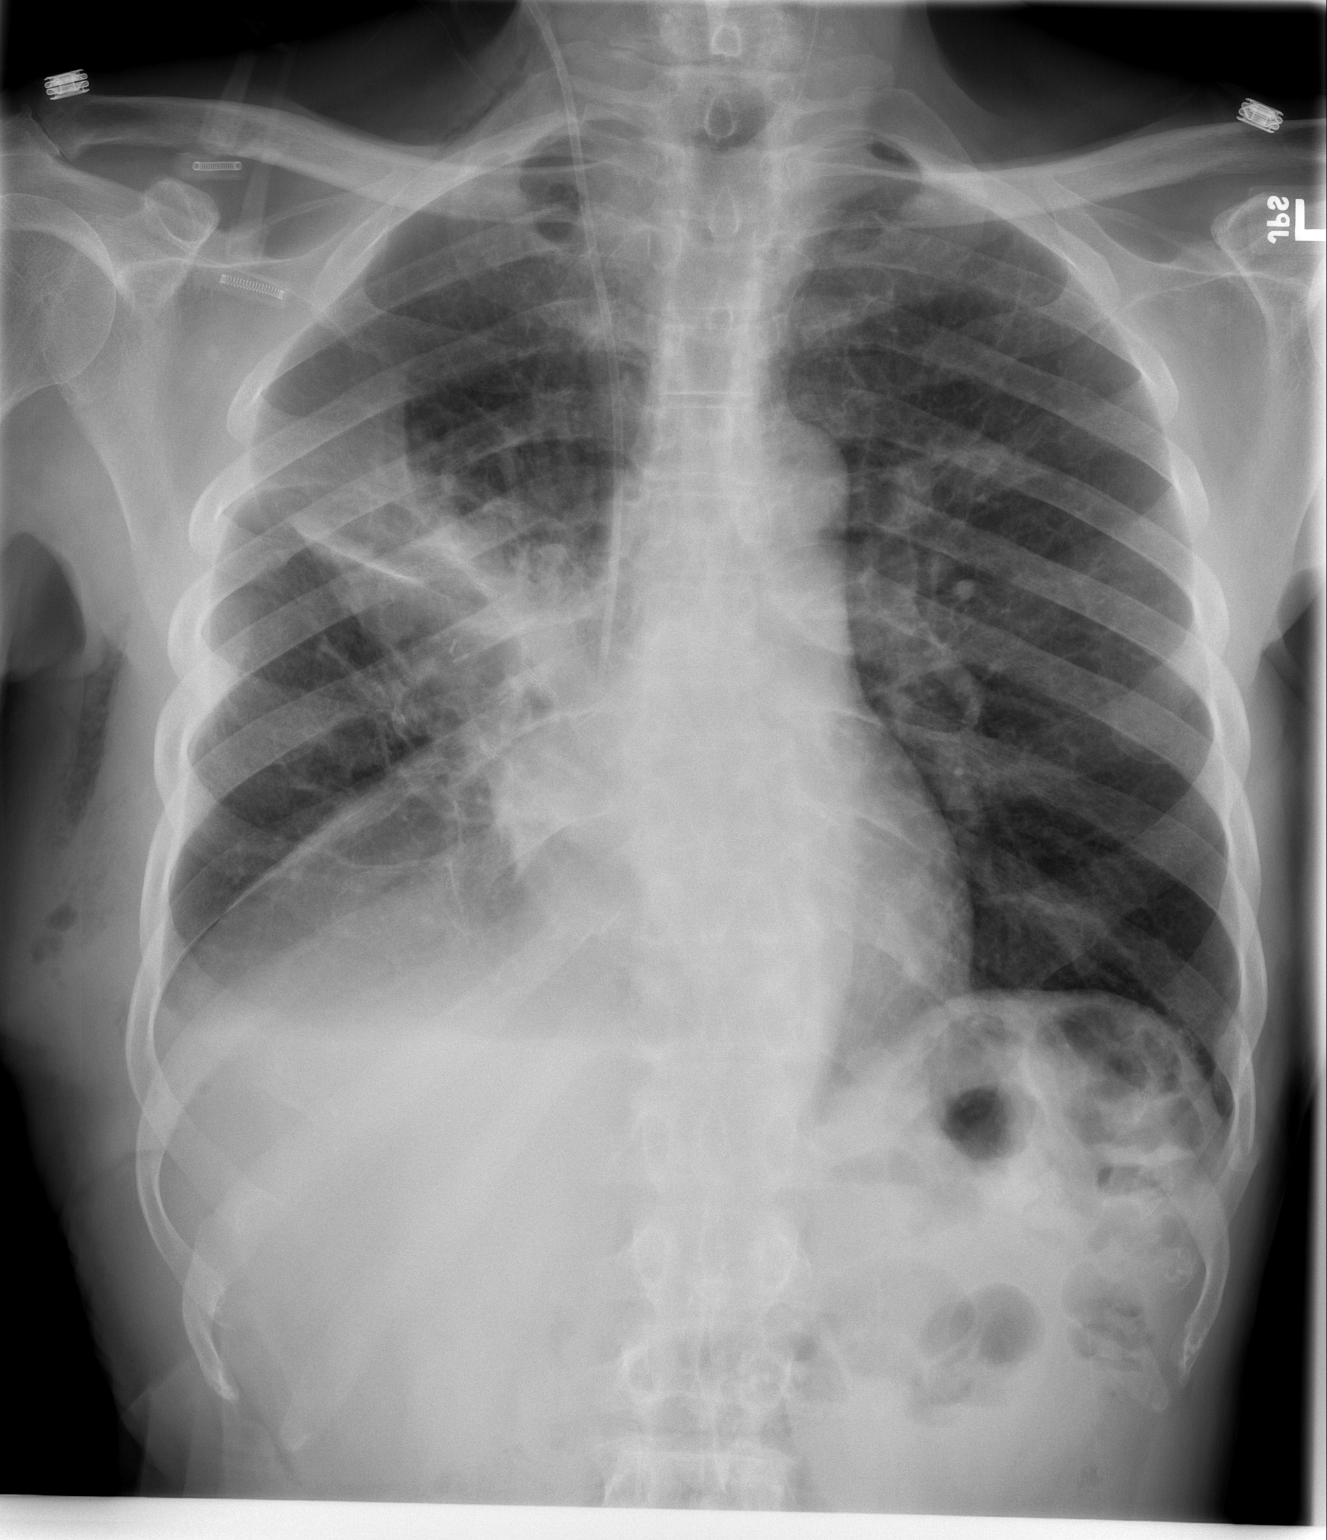

[w chest lat]
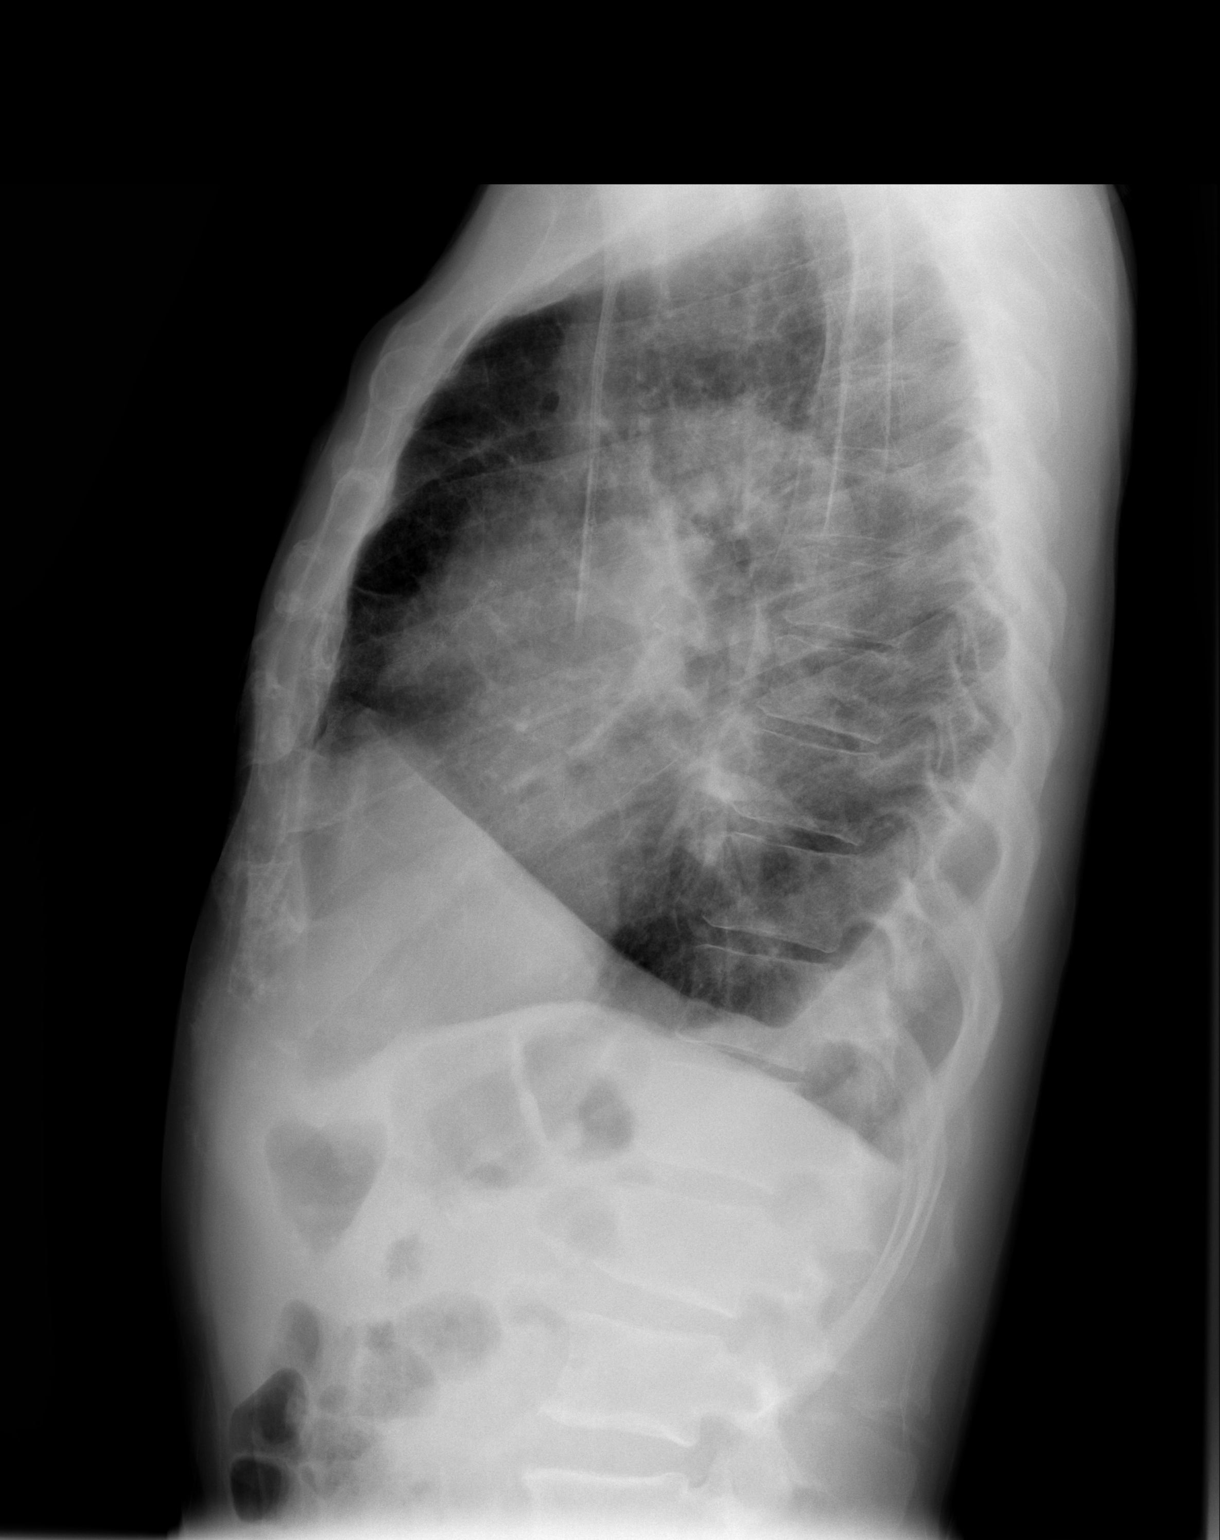

[2 of 2 positions shown; findings below may reference images not displayed]

FINDINGS: Two-view chest.  Stable right IJ approach central venous
catheter.  Sequelae of partial right pneumonectomy with persistent
pleural effusion and perihilar opacity/distortion.  Small volume of
subcutaneous emphysema on the right chest wall.  No pneumothorax
identified.  Stable cardiac size and mediastinal contours.  Left
lung is stable/clear.
IMPRESSION: 1.  Postoperative changes in the right lung with persistent pleural
fluid and perihilar distortion.  No pneumothorax.
2.  Small volume right chest wall subcutaneous emphysema.

## 2009-02-19 ENCOUNTER — Encounter: Admission: RE | Admit: 2009-02-19 | Discharge: 2009-02-19 | Payer: Self-pay | Admitting: Thoracic Surgery

## 2009-02-19 ENCOUNTER — Ambulatory Visit: Payer: Self-pay | Admitting: Thoracic Surgery

## 2009-02-19 IMAGING — CR DG CHEST 2V
2 series · 2 of 2 positions shown · non-contrast
Comparison: Two-view chest x-ray [DATE].

CLINICAL DATA: Previous right VATS with partial right lung
resection.

CHEST - 2 VIEW [DATE]:

[view not recorded (1 of 2)]
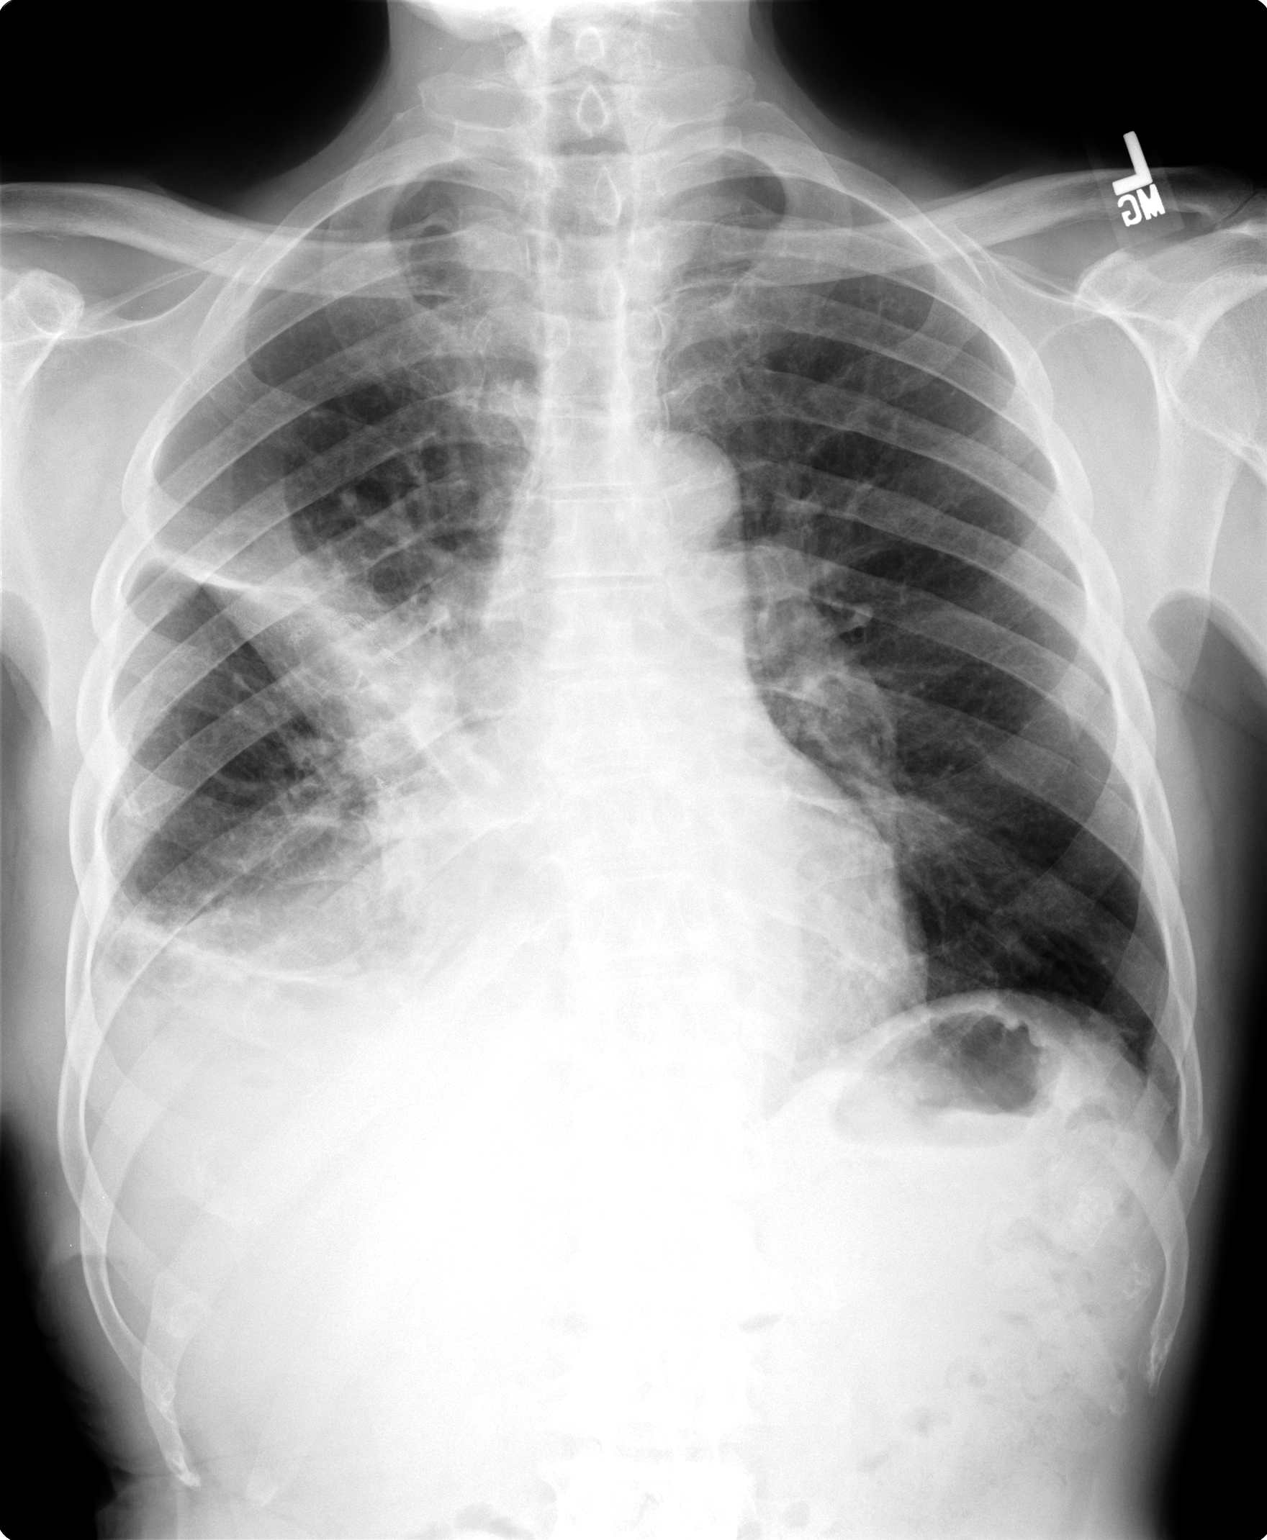

[view not recorded (2 of 2)]
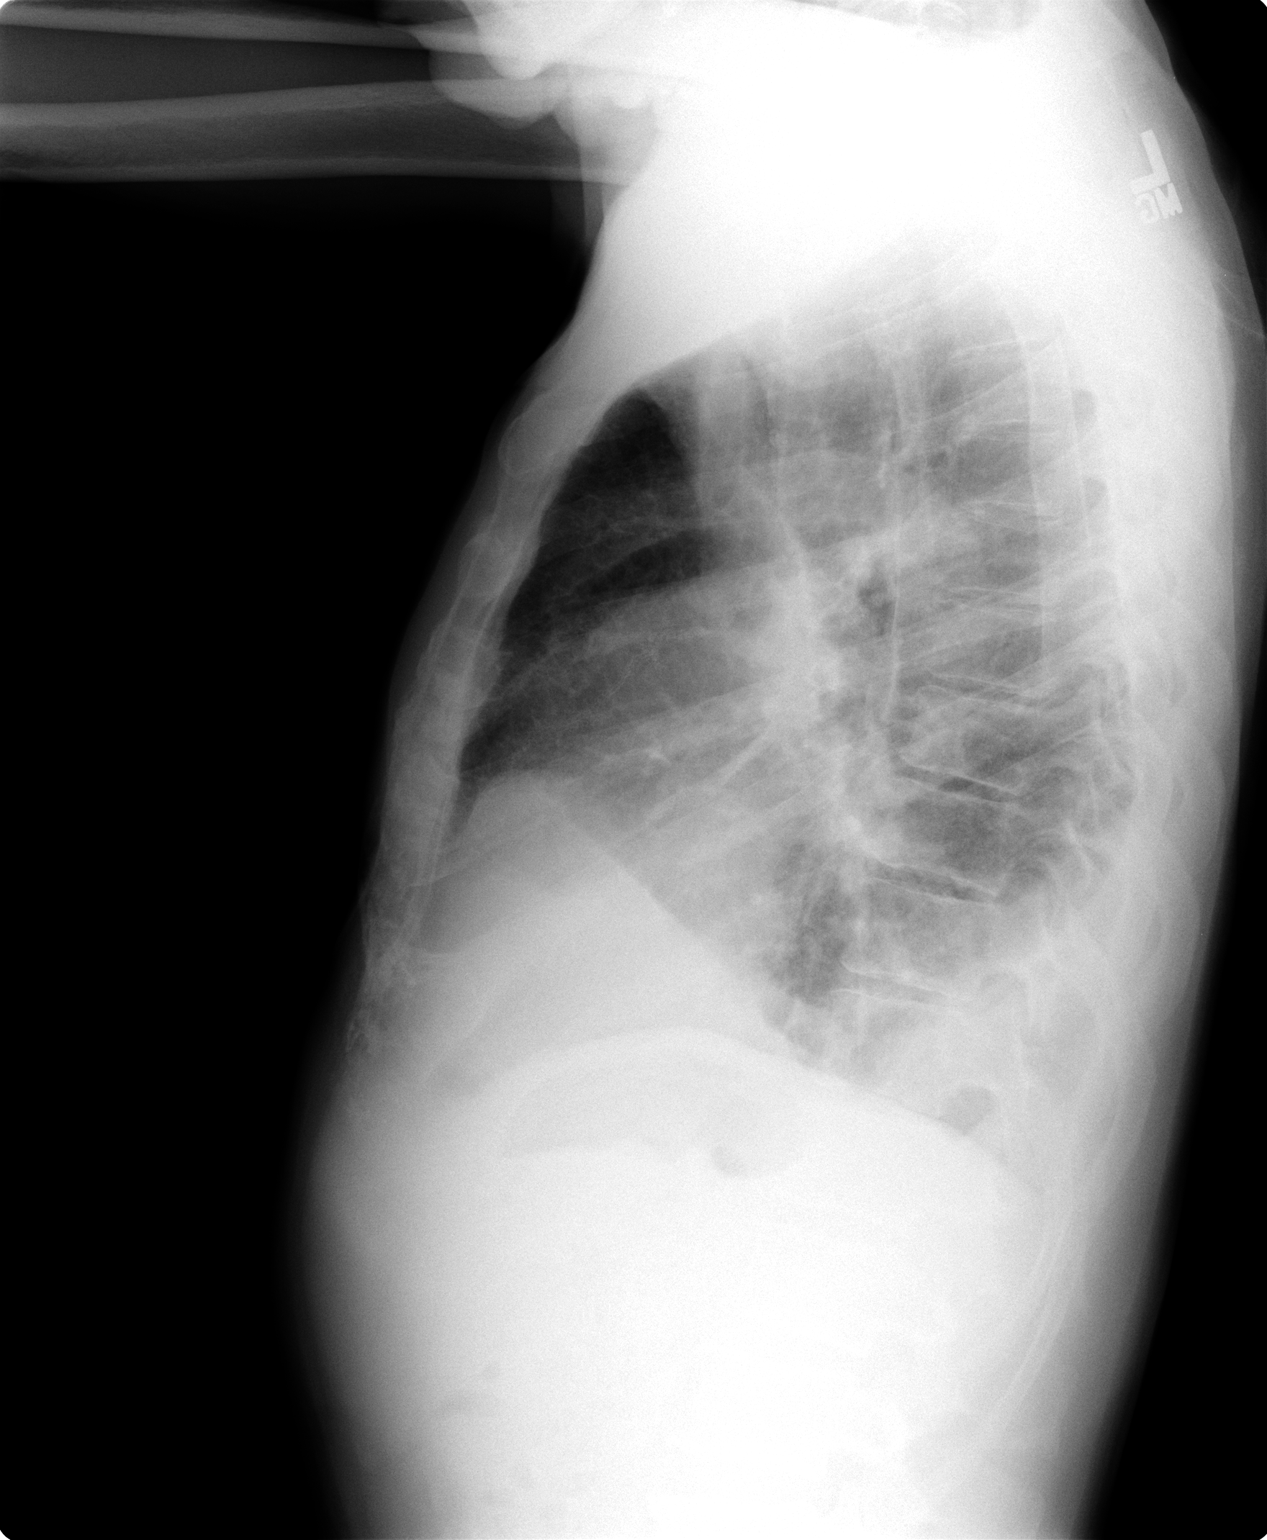

[2 of 2 positions shown; findings below may reference images not displayed]

FINDINGS: Interval removal of right internal jugular central venous
catheter.  Post-surgical pleuroparenchymal scarring on the right.
Persistent right pleural effusion, slightly decreased in size since
the prior study.  Left lung remains clear.  No new pulmonary
parenchymal abnormalities.  No left pleural effusion.
Cardiomediastinal silhouette unremarkable for age, unchanged.
Visualized bony thorax intact.
IMPRESSION: Post-surgical pleuroparenchymal scarring on the right.  Persistent
small right pleural effusion, decreased in size since the study 8
days ago.  No new abnormalities.

## 2009-03-05 ENCOUNTER — Ambulatory Visit: Payer: Self-pay | Admitting: Internal Medicine

## 2009-03-05 ENCOUNTER — Ambulatory Visit: Payer: Self-pay | Admitting: Thoracic Surgery

## 2009-03-05 ENCOUNTER — Encounter: Admission: RE | Admit: 2009-03-05 | Discharge: 2009-03-05 | Payer: Self-pay | Admitting: Thoracic Surgery

## 2009-03-05 IMAGING — CR DG CHEST 2V
2 series · 2 of 2 positions shown · non-contrast
Comparison: [DATE]

CLINICAL DATA: History of right VATS with partial right lung
resection.

CHEST - 2 VIEW

[w chest pa]
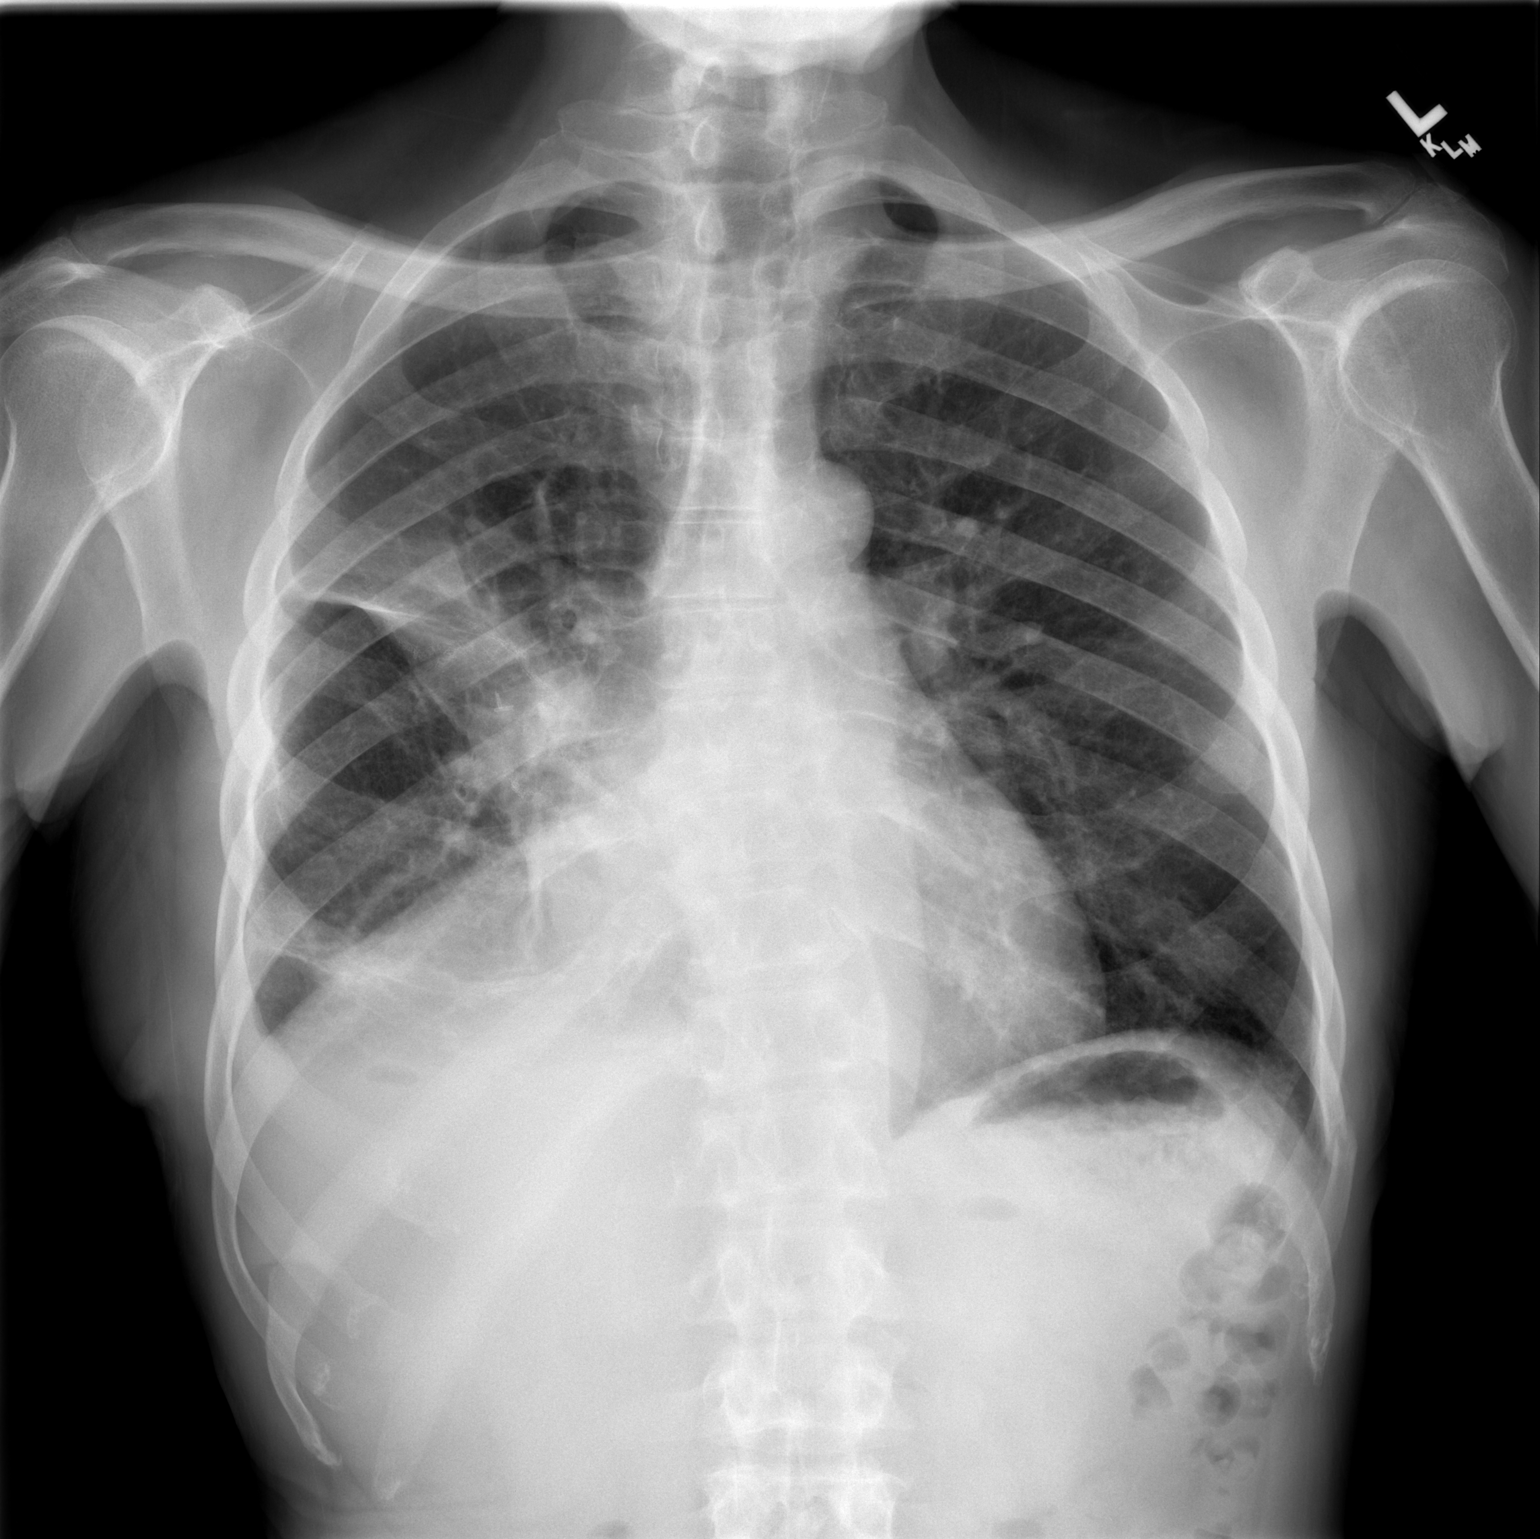

[w chest lat]
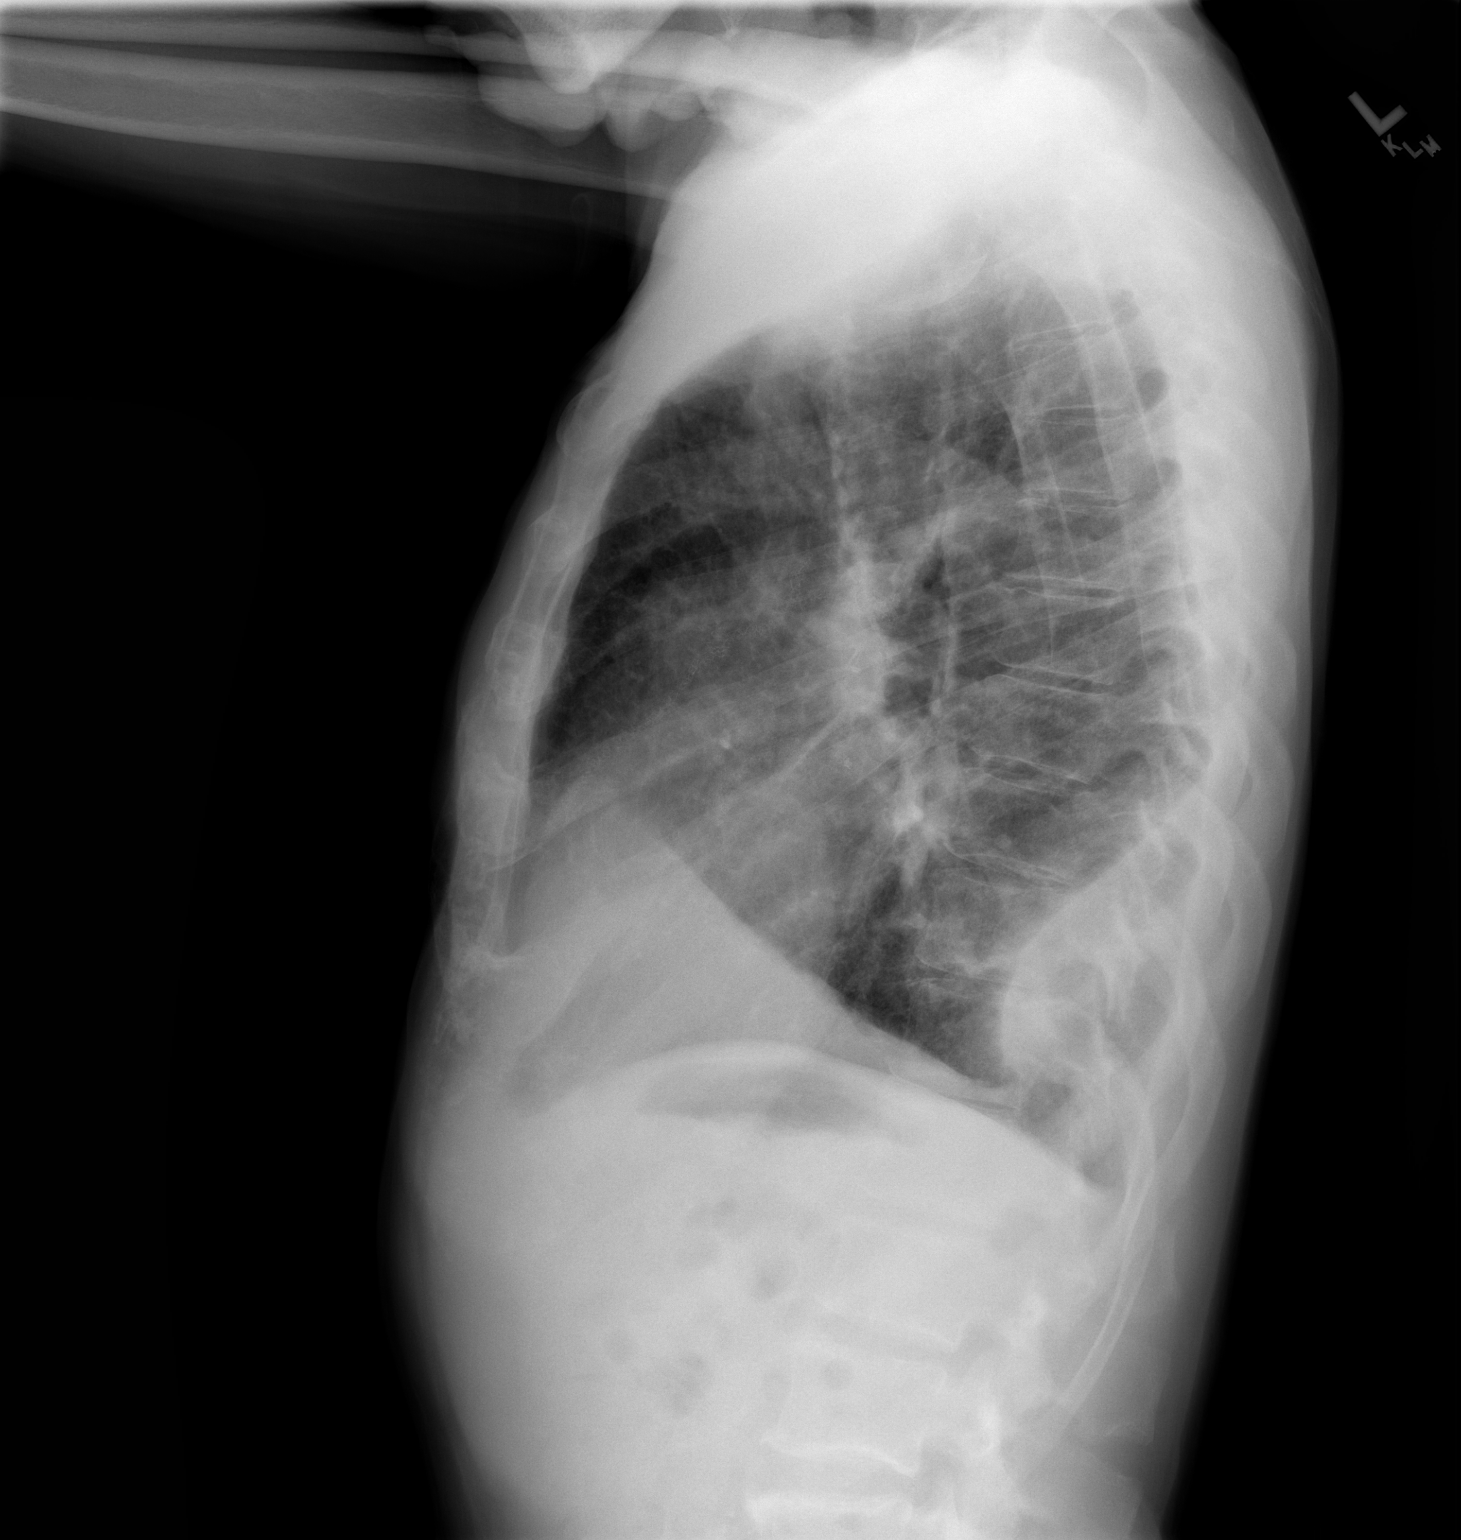

[2 of 2 positions shown; findings below may reference images not displayed]

FINDINGS: Trachea is midline.  Heart size stable.  There are
postoperative changes in the right hemithorax, with stable pleural
parenchymal opacities in the mid and right lower lung zones.  No
pneumothorax.  Left lung is clear.
IMPRESSION: Postoperative changes in the right hemithorax with stable loculated
right pleural fluid and right mid and lower lung zone parenchymal
densities.

## 2009-03-17 LAB — CBC WITH DIFFERENTIAL/PLATELET
Eosinophils Absolute: 0.2 10*3/uL (ref 0.0–0.5)
HCT: 29.5 % — ABNORMAL LOW (ref 38.4–49.9)
LYMPH%: 35.4 % (ref 14.0–49.0)
MCHC: 34.5 g/dL (ref 32.0–36.0)
MCV: 88.3 fL (ref 79.3–98.0)
MONO#: 1.2 10*3/uL — ABNORMAL HIGH (ref 0.1–0.9)
MONO%: 16.4 % — ABNORMAL HIGH (ref 0.0–14.0)
NEUT#: 3.2 10*3/uL (ref 1.5–6.5)
NEUT%: 44.8 % (ref 39.0–75.0)
Platelets: 682 10*3/uL — ABNORMAL HIGH (ref 140–400)
RBC: 3.35 10*6/uL — ABNORMAL LOW (ref 4.20–5.82)
WBC: 7.2 10*3/uL (ref 4.0–10.3)

## 2009-03-17 LAB — COMPREHENSIVE METABOLIC PANEL
Alkaline Phosphatase: 97 U/L (ref 39–117)
CO2: 21 mEq/L (ref 19–32)
Creatinine, Ser: 1 mg/dL (ref 0.40–1.50)
Glucose, Bld: 111 mg/dL — ABNORMAL HIGH (ref 70–99)
Sodium: 137 mEq/L (ref 135–145)
Total Bilirubin: 0.2 mg/dL — ABNORMAL LOW (ref 0.3–1.2)

## 2009-04-29 ENCOUNTER — Ambulatory Visit: Payer: Self-pay | Admitting: Thoracic Surgery

## 2009-04-29 ENCOUNTER — Encounter: Admission: RE | Admit: 2009-04-29 | Discharge: 2009-04-29 | Payer: Self-pay | Admitting: Thoracic Surgery

## 2009-04-29 IMAGING — CR DG CHEST 2V
2 series · 2 of 2 positions shown · non-contrast
Comparison: [DATE]

CLINICAL DATA: Status post partial right lung resection.

CHEST - 2 VIEW

[w chest pa]
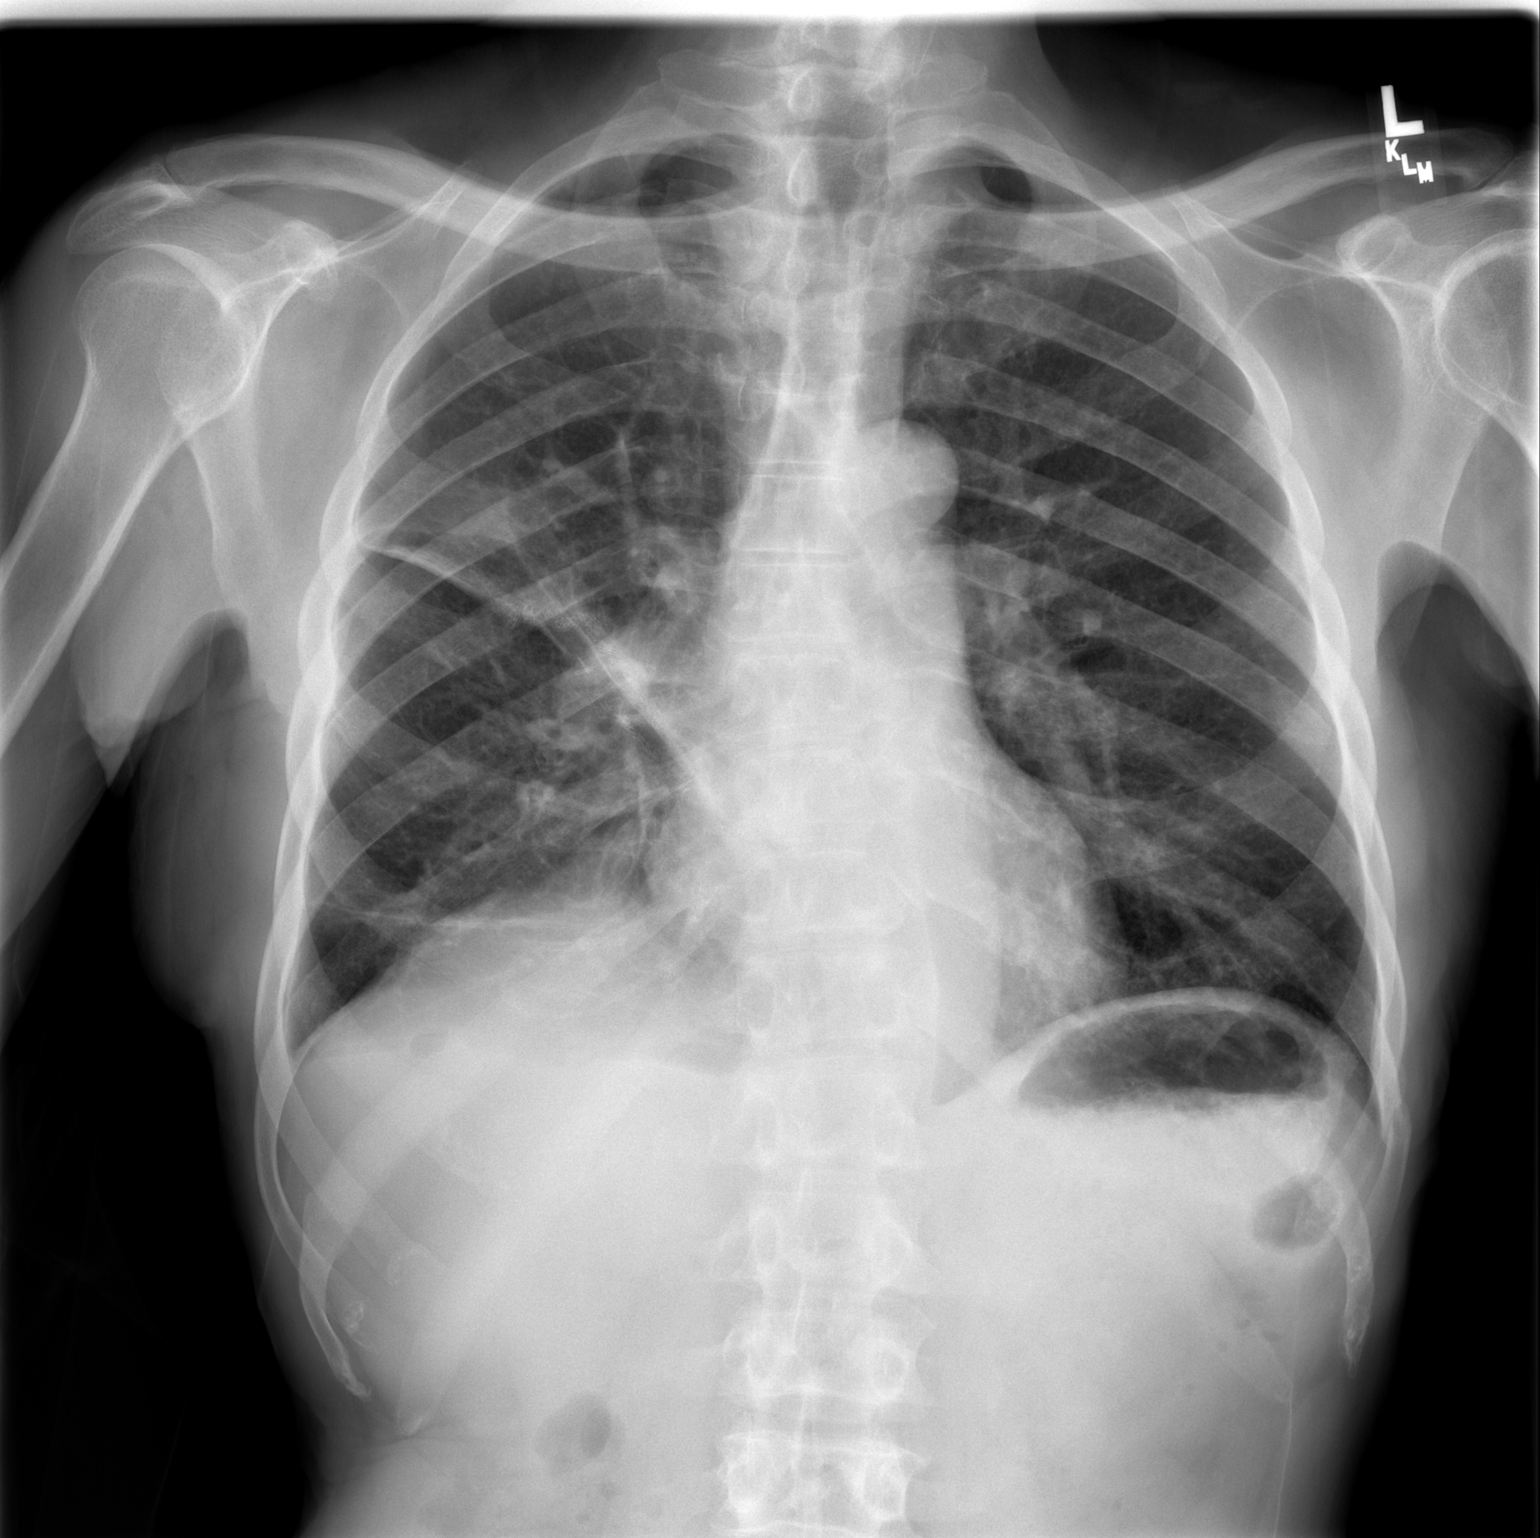

[w chest lat]
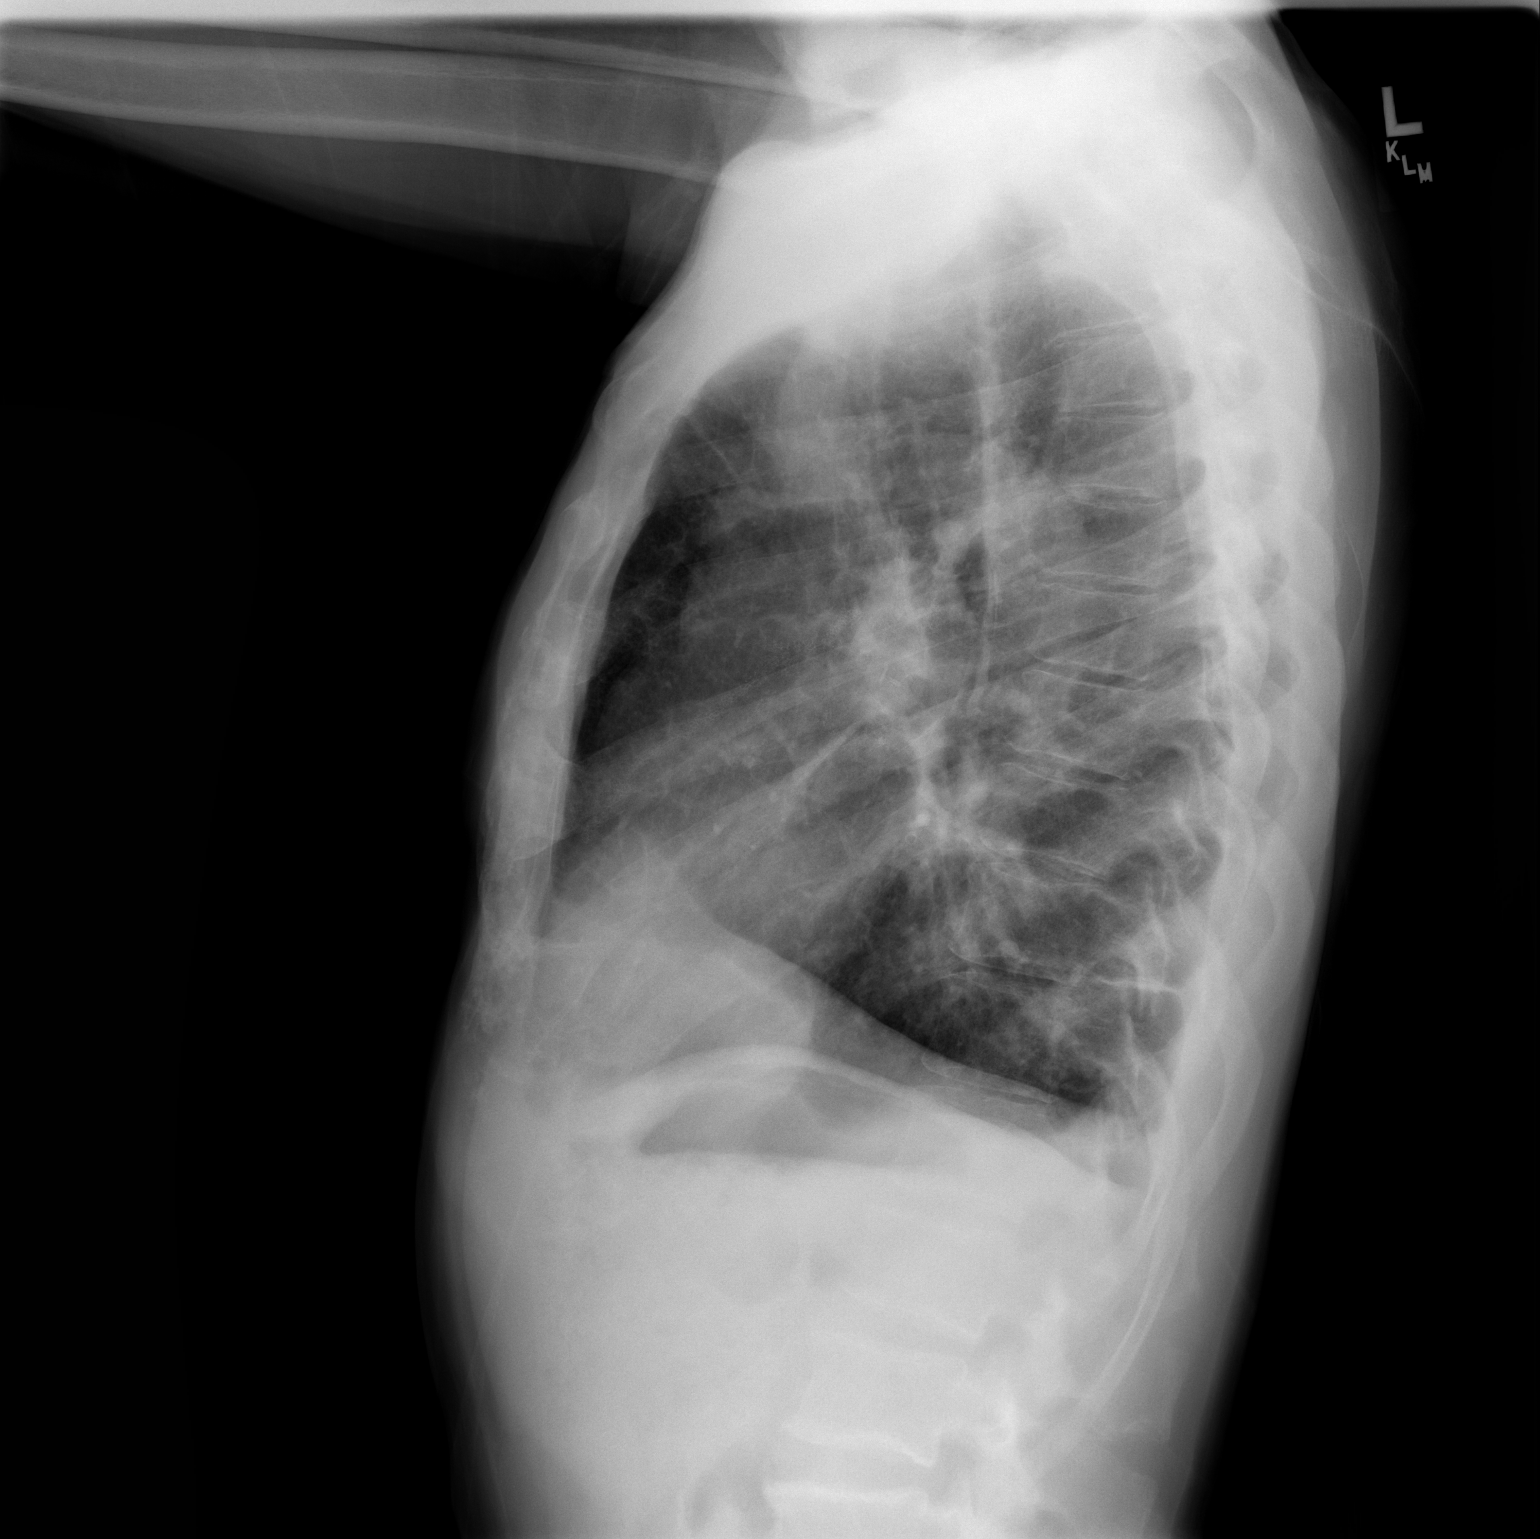

[2 of 2 positions shown; findings below may reference images not displayed]

FINDINGS: Cardiomediastinal silhouette is stable.  Postoperative
changes with scarring in the right midlung and right base are
slight improved from prior exam.  No acute infiltrate or edema.  No
pneumothorax.  Bony thorax is stable.
IMPRESSION: No acute disease.  Postoperative changes with scarring in the right
midlung and right basilar slight improved from prior exam.  No
pneumothorax.

## 2009-07-16 ENCOUNTER — Ambulatory Visit: Payer: Self-pay | Admitting: Thoracic Surgery

## 2009-07-16 ENCOUNTER — Encounter: Admission: RE | Admit: 2009-07-16 | Discharge: 2009-07-16 | Payer: Self-pay | Admitting: Thoracic Surgery

## 2009-07-16 IMAGING — CR DG CHEST 2V
2 series · 2 of 2 positions shown · non-contrast
Comparison: [DATE]

CLINICAL DATA: Status post partial right lung resection.

CHEST - 2 VIEW

[view not recorded (1 of 2)]
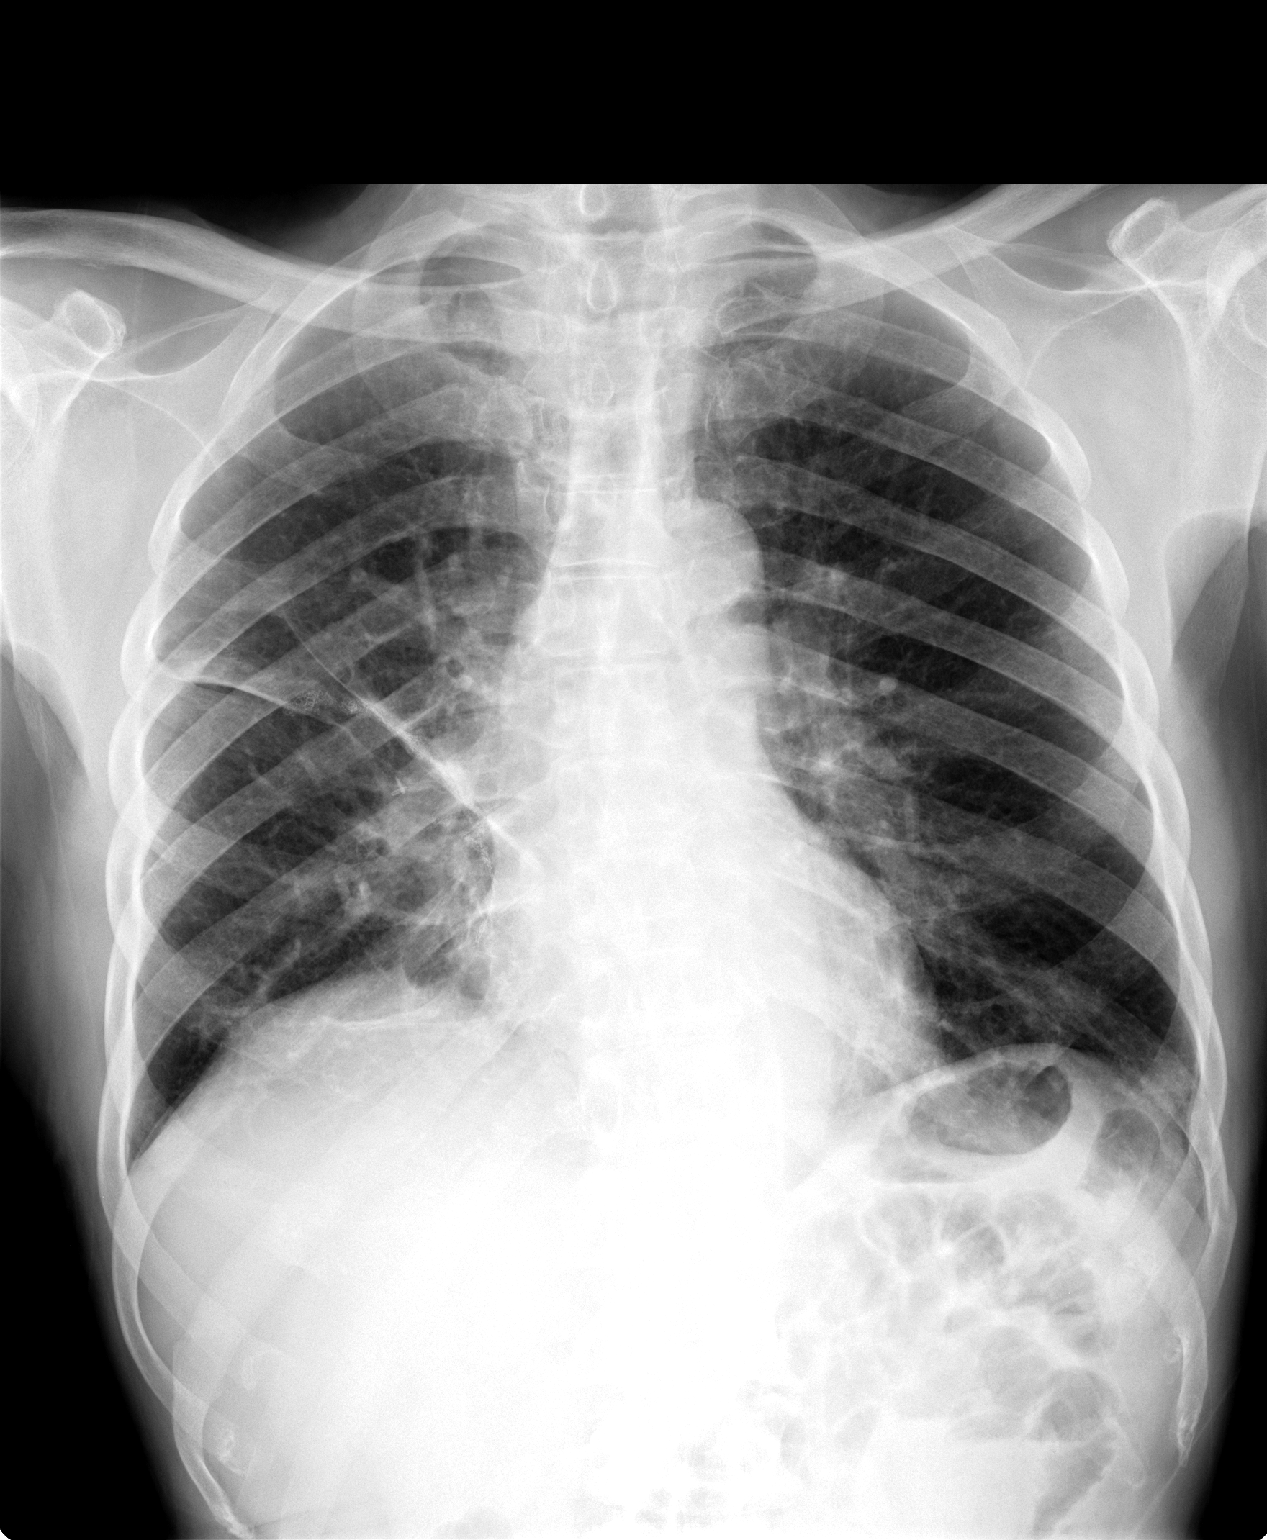

[view not recorded (2 of 2)]
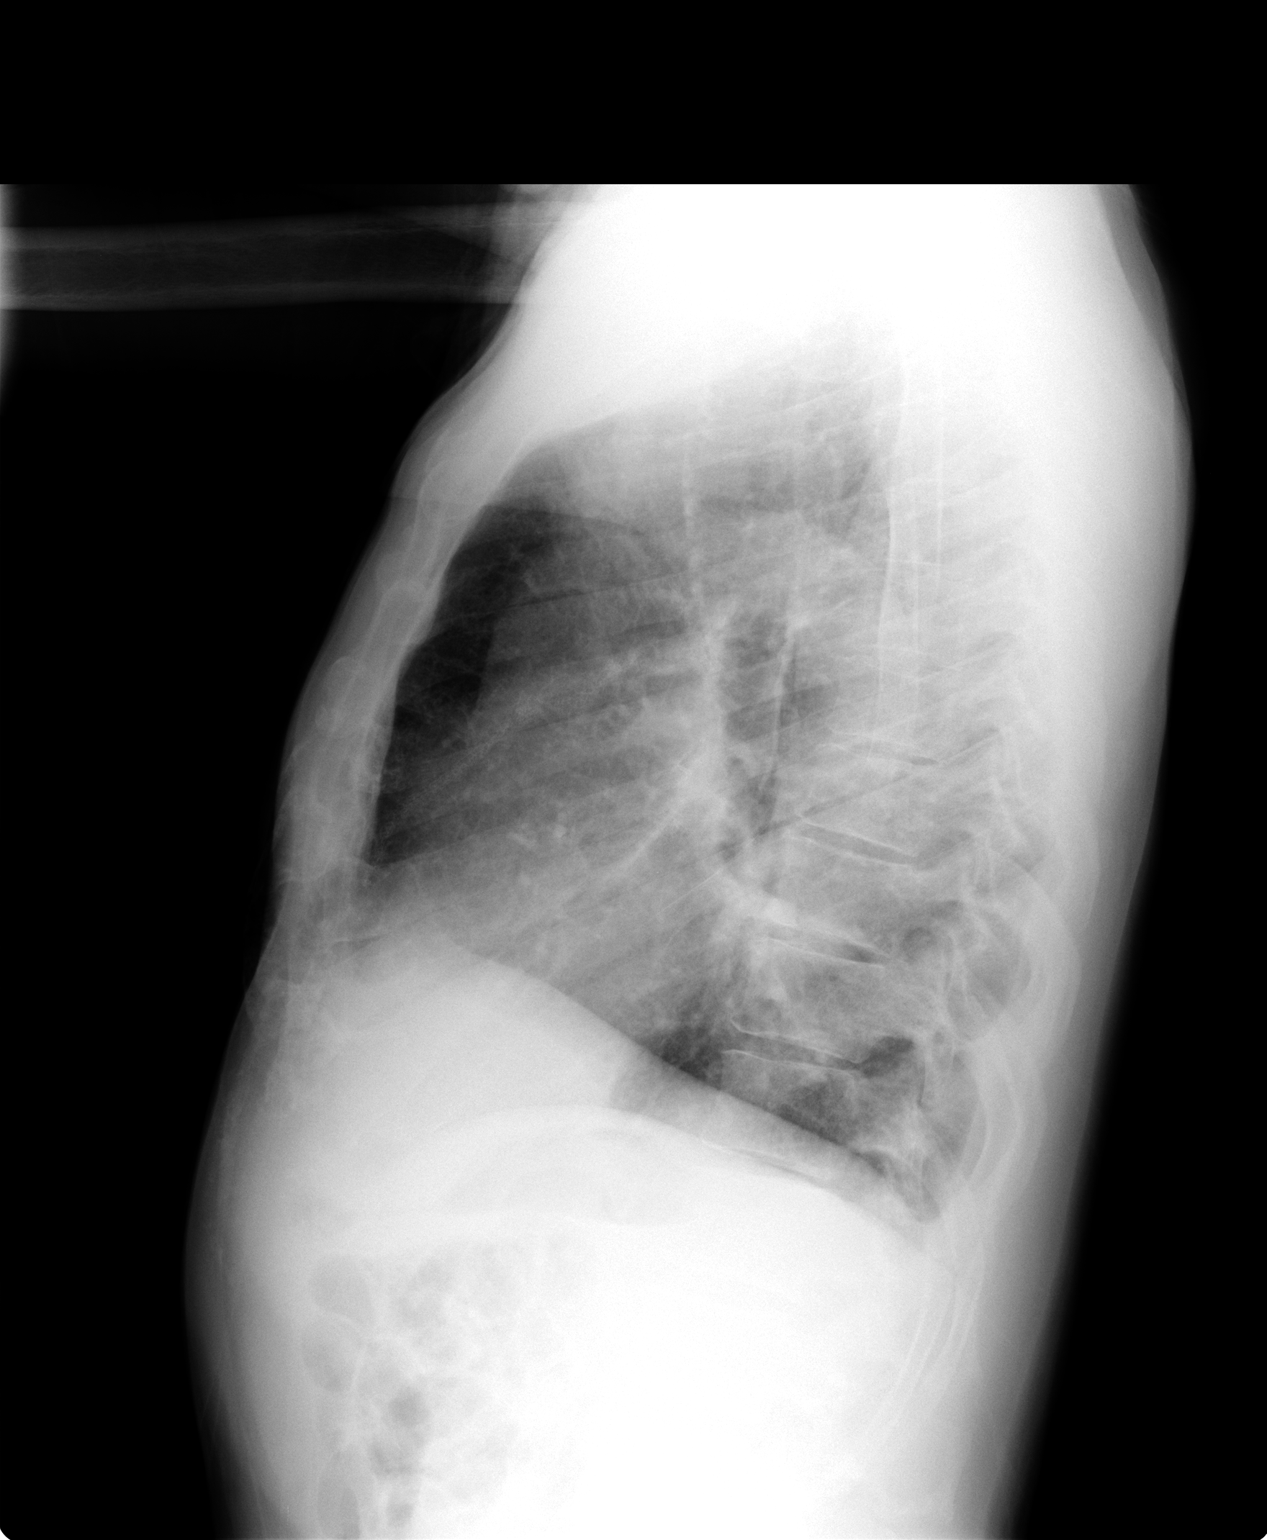

[2 of 2 positions shown; findings below may reference images not displayed]

FINDINGS: Linear scarring in the right mid to lower lung zones.  No
infiltrates.  Cardiomediastinal silhouette unremarkable.  Bony
thorax intact.
IMPRESSION: Linear scarring at the right lung.  No acute chest findings.

## 2009-07-18 ENCOUNTER — Ambulatory Visit: Payer: Self-pay | Admitting: Dentistry

## 2009-07-18 ENCOUNTER — Encounter: Admission: RE | Admit: 2009-07-18 | Discharge: 2009-07-18 | Payer: Self-pay | Admitting: Dentistry

## 2009-09-08 ENCOUNTER — Ambulatory Visit: Payer: Self-pay | Admitting: Internal Medicine

## 2010-07-19 ENCOUNTER — Encounter: Payer: Self-pay | Admitting: Thoracic Surgery

## 2010-07-19 ENCOUNTER — Encounter: Payer: Self-pay | Admitting: Internal Medicine

## 2010-07-20 ENCOUNTER — Encounter: Payer: Self-pay | Admitting: Internal Medicine

## 2010-10-03 LAB — GLUCOSE, CAPILLARY
Glucose-Capillary: 107 mg/dL — ABNORMAL HIGH (ref 70–99)
Glucose-Capillary: 91 mg/dL (ref 70–99)
Glucose-Capillary: 94 mg/dL (ref 70–99)
Glucose-Capillary: 96 mg/dL (ref 70–99)
Glucose-Capillary: 98 mg/dL (ref 70–99)
Glucose-Capillary: 99 mg/dL (ref 70–99)

## 2010-10-03 LAB — COMPREHENSIVE METABOLIC PANEL
ALT: 20 U/L (ref 0–53)
AST: 29 U/L (ref 0–37)
Albumin: 2.4 g/dL — ABNORMAL LOW (ref 3.5–5.2)
Alkaline Phosphatase: 126 U/L — ABNORMAL HIGH (ref 39–117)
Alkaline Phosphatase: 76 U/L (ref 39–117)
BUN: 16 mg/dL (ref 6–23)
BUN: 6 mg/dL (ref 6–23)
CO2: 23 mEq/L (ref 19–32)
CO2: 24 mEq/L (ref 19–32)
Calcium: 8.2 mg/dL — ABNORMAL LOW (ref 8.4–10.5)
Calcium: 9.2 mg/dL (ref 8.4–10.5)
Chloride: 103 mEq/L (ref 96–112)
Creatinine, Ser: 0.73 mg/dL (ref 0.4–1.5)
GFR calc Af Amer: >60 mL/min (ref >60)
GFR calc non Af Amer: >60 mL/min (ref >60)
GFR calc non Af Amer: >60 mL/min (ref >60)
Glucose, Bld: 112 mg/dL — ABNORMAL HIGH (ref 70–99)
Glucose, Bld: 122 mg/dL — ABNORMAL HIGH (ref 70–99)
Potassium: 3.8 mEq/L (ref 3.5–5.1)
Sodium: 132 mEq/L — ABNORMAL LOW (ref 135–145)
Total Bilirubin: 0.7 mg/dL (ref 0.3–1.2)
Total Protein: 6.7 g/dL (ref 6.0–8.3)
Total Protein: 8.4 g/dL — ABNORMAL HIGH (ref 6.0–8.3)

## 2010-10-03 LAB — BASIC METABOLIC PANEL
BUN: 7 mg/dL (ref 6–23)
BUN: 9 mg/dL (ref 6–23)
CO2: 22 mEq/L (ref 19–32)
CO2: 24 mEq/L (ref 19–32)
Calcium: 8.2 mg/dL — ABNORMAL LOW (ref 8.4–10.5)
Chloride: 101 mEq/L (ref 96–112)
Chloride: 101 mEq/L (ref 96–112)
Creatinine, Ser: 0.74 mg/dL (ref 0.4–1.5)
Creatinine, Ser: 0.77 mg/dL (ref 0.4–1.5)
GFR calc Af Amer: >60 mL/min (ref >60)
GFR calc non Af Amer: >60 mL/min (ref >60)
Glucose, Bld: 110 mg/dL — ABNORMAL HIGH (ref 70–99)
Glucose, Bld: 171 mg/dL — ABNORMAL HIGH (ref 70–99)
Potassium: 3.8 mEq/L (ref 3.5–5.1)
Potassium: 4.1 mEq/L (ref 3.5–5.1)
Sodium: 130 mEq/L — ABNORMAL LOW (ref 135–145)

## 2010-10-03 LAB — POCT I-STAT 3, ART BLOOD GAS (G3+)
Acid-base deficit: 2 mmol/L (ref 0.0–2.0)
Bicarbonate: 23.4 mEq/L (ref 20.0–24.0)
Patient temperature: 98.8
pH, Arterial: 7.374 (ref 7.350–7.450)

## 2010-10-03 LAB — TYPE AND SCREEN: Antibody Screen: NEGATIVE

## 2010-10-03 LAB — CBC
HCT: 26.6 % — ABNORMAL LOW (ref 39.0–52.0)
HCT: 26.9 % — ABNORMAL LOW (ref 39.0–52.0)
HCT: 30.2 % — ABNORMAL LOW (ref 39.0–52.0)
HCT: 39.1 % (ref 39.0–52.0)
Hemoglobin: 9.3 g/dL — ABNORMAL LOW (ref 13.0–17.0)
Hemoglobin: 13.6 g/dL (ref 13.0–17.0)
Hemoglobin: 9.3 g/dL — ABNORMAL LOW (ref 13.0–17.0)
Hemoglobin: 9.4 g/dL — ABNORMAL LOW (ref 13.0–17.0)
MCHC: 34.7 g/dL (ref 30.0–36.0)
MCHC: 34.8 g/dL (ref 30.0–36.0)
MCHC: 34.9 g/dL (ref 30.0–36.0)
MCV: 95.8 fL (ref 78.0–100.0)
MCV: 96.2 fL (ref 78.0–100.0)
MCV: 96.9 fL (ref 78.0–100.0)
Platelets: 185 10*3/uL (ref 150–400)
Platelets: 198 10*3/uL (ref 150–400)
Platelets: 203 10*3/uL (ref 150–400)
RBC: 2.8 MIL/uL — ABNORMAL LOW (ref 4.22–5.81)
RDW: 14.7 % (ref 11.5–15.5)
RDW: 14.7 % (ref 11.5–15.5)
RDW: 14.9 % (ref 11.5–15.5)
RDW: 15.3 % (ref 11.5–15.5)
RDW: 15.5 % (ref 11.5–15.5)
WBC: 9.6 10*3/uL (ref 4.0–10.5)

## 2010-10-03 LAB — MRSA PCR SCREENING: MRSA by PCR: NEGATIVE

## 2010-10-03 LAB — BLOOD GAS, ARTERIAL
Acid-Base Excess: 0.6 mmol/L (ref 0.0–2.0)
Bicarbonate: 24.2 mEq/L — ABNORMAL HIGH (ref 20.0–24.0)
O2 Saturation: 96.3 %
TCO2: 25.3 mmol/L (ref 0–100)
pO2, Arterial: 78.8 mmHg — ABNORMAL LOW (ref 80.0–100.0)

## 2010-10-03 LAB — URINALYSIS, ROUTINE W REFLEX MICROSCOPIC
Hgb urine dipstick: NEGATIVE
Hgb urine dipstick: NEGATIVE
Ketones, ur: NEGATIVE mg/dL
Leukocytes, UA: NEGATIVE
Nitrite: NEGATIVE
Protein, ur: 100 mg/dL — AB
Specific Gravity, Urine: 1.016 (ref 1.005–1.030)
Urobilinogen, UA: 1 mg/dL (ref 0.0–1.0)
Urobilinogen, UA: 1 mg/dL (ref 0.0–1.0)

## 2010-10-03 LAB — URINE MICROSCOPIC-ADD ON

## 2010-10-03 LAB — NA AND K (SODIUM & POTASSIUM), RAND UR: Sodium, Ur: 69 mEq/L

## 2010-10-03 LAB — PROTIME-INR
INR: 1 (ref 0.00–1.49)
Prothrombin Time: 13.3 seconds (ref 11.6–15.2)

## 2010-10-03 LAB — ABO/RH: ABO/RH(D): A POS

## 2010-11-10 NOTE — Letter (Signed)
July 16, 2009   Velora Heckler. Arbutus Ped, MD  [address]  [place], [place] [address]   Re:  KEELON, Gary Stevenson                DOB:  Sep 11, 1940   Dear Arbutus Ped,   I saw the patient in the office today and his chest x-ray is stable.  His incision is well healed.  His blood pressure is 157/90, pulse 76,  respirations 18, sats were 96%.  He has very poor dental hygiene, and I  think this may be a problem with him particularly If he needs any future  surgery.  He has got a CT scan scheduled in March by you.  I will let  you follow him from now.  I will be happy to see him.  If he has any  future problems, I want to see if we can refer him to Dr. Kristin Bruins  regarding his dentition and see if he needs to have any tooth removals.   Ines Bloomer, M.D.  Electronically Signed   DPB/MEDQ  D:  07/16/2009  T:  07/17/2009  Job:  [id number]   cc:   Charlynne Pander, D.D.S.

## 2010-11-10 NOTE — Letter (Signed)
February 19, 2009   Casimiro Needle B. Sherene Sires, MD  [address]  [place], [place]  [address]   Re:  CINSERE, MIZRAHI                DOB:  April 04, 1941   Dear Kathlene November,   I saw the patient back after his surgeries.  Blood pressure was 150/80,  pulse was 74, respirations 18, and saturations were 98%.  His chest x-  ray showed resolution of the postoperative changes.  His lungs were  clear to auscultation and percussion.  His incisions were well healed.  We removed his chest tube sutures.  We will see him back again in 2  weeks with another chest x-ray.  There has been satisfactory progress  after having a stage IA non-small cell lung cancer and having a VATS  right middle lobectomy.   Sincerely,   Ines Bloomer, M.D.  Electronically Signed   DPB/MEDQ  D:  02/19/2009  T:  02/20/2009  Job:  [id number]

## 2010-11-10 NOTE — Letter (Signed)
January 21, 2009   Casimiro Needle B. Sherene Sires, MD, FCCP  [address]  [place], [place] [address]   Re:  Gary, Stevenson                DOB:  1940-10-19   Dear Casimiro Needle,   I appreciate the opportunity of seeing the patient.  This 70 year old  African American male who was found to have a right middle lobe lesion  on his chest x-ray.  CT scan showed a 3-1/2 cm lesion in his right  middle lobe, PET scan was positive in this area, but negative for  adenopathy or any other evidence of metastatic disease.  His uptake was  increased of over 10 in the right middle lobe lesion.  His pulmonary  function test showed an FVC of 3.07 with an FEV-1 of 2.12.  He still  smokes half a pack of cigarettes a day.  He has had no hemoptysis,  fever, chills, excessive sputum but has had a 15-pound weight loss.   PAST MEDICAL HISTORY:  His medication include Aleve and no allergies.  He has chronic obstructive pulmonary disease.   FAMILY HISTORY:  Noncontributory.   SOCIAL HISTORY:  He smokes 2-3 cigarettes per day.  He is single, has  several children who came in with him today and occasional alcohol  intake.  He is retired.   REVIEW OF SYSTEMS:  Cardiac:  No angina or atrial fibrillation.  Pulmonary:  No hemoptysis.  GI:  No diarrhea, constipation or reflux.  GU:  No kidney disease, dysuria or frequent urination.  Vascular:  No  claudication, DVT, TIAs.  Neurologic:  No dizziness headaches,  blackouts, or seizures.  Musculoskeletal:  Arthritis.  Psychiatric:  No  depression or nervousness.  Eye/ENT:  No change in his eyesight or  hearing.  Hematologic:  No problems with bleeding, clotting disorders or  anemia.   PHYSICAL EXAMINATION:  GENERAL:  He is a well-developed [place] American  male in no acute distress.  VITAL SIGNS:  His blood pressure was 166/93, pulse 66, respirations 18,  sats were 98%.  HEAD, EYES, EARS, NOSE AND THROAT:  Unremarkable.  NECK:  Supple without thyromegaly.  There is no  supraclavicular or  axillary adenopathy.  CHEST:  Clear to auscultation and percussion.  HEART:  Regular sinus rhythm.  No murmurs.  ABDOMEN:  Soft.  There is no hepatosplenomegaly.  EXTREMITIES:  Pulses are 2+.  There is no clubbing or edema.  NEUROLOGIC:  He is oriented x3.  Sensory and motor intact.  Cranial  nerves intact.   I feel that he has got a stage IB or IIA non-small cell lung cancer and  I have scheduled him for a VATS, right middle lobectomy on the August 12  at [facility].  I appreciate the opportunity of seeing the patient.   Sincerely,   Ines Bloomer, M.D.  Electronically Signed   DPB/MEDQ  D:  01/21/2009  T:  01/22/2009  Job:  [id number]

## 2010-11-10 NOTE — Assessment & Plan Note (Signed)
OFFICE VISIT   RAMADAN, COUEY  DOB:  03-28-1941                                        April 29, 2009  CHART #:  [id number]   The patient came today.  His chest x-ray shows normal postoperative  changes after his right middle lobectomy.  His blood pressure is 158/95,  pulse 96, respirations 18, sats are 94%.  He was a stage IB non-small-  cell lung cancer with negative nodes.  I plan to see him back again in 2  months with a chest x-ray.   Ines Bloomer, M.D.  Electronically Signed   DPB/MEDQ  D:  04/29/2009  T:  04/30/2009  Job:  [id number]

## 2010-11-10 NOTE — Discharge Summary (Signed)
Gary Stevenson, HASSING NO.:  [id number]   MEDICAL RECORD NO.:  [id number]          PATIENT TYPE:  INP   LOCATION:  3305                         FACILITY:  [facility]   PHYSICIAN:  Ines Bloomer, M.D. DATE OF BIRTH:  July 01, 1940   DATE OF ADMISSION:  02/06/2009  DATE OF DISCHARGE:  02/11/2009                               DISCHARGE SUMMARY   PRIMARY ADMITTING DIAGNOSIS:  Right middle lobe lung lesion.   ADDITIONAL/DISCHARGE DIAGNOSES:  1. Squamous cell carcinoma, moderately differentiated of the right      middle lobe (T2a, N0, MX).  2. Chronic obstructive pulmonary disease.  3. Tobacco abuse.  4. Postoperative tachycardia.   PROCEDURES PERFORMED:  Right video-assisted thoracoscopic surgery, right  mini thoracotomy, right middle lobectomy with node dissection.   HISTORY:  The patient is a 70 year old male who was recently found to  have a right middle lobe lung lesion on chest x-ray.  A CT scan was then  performed which showed a 3.5 cm right middle lobe mass.  He also  underwent a PET scan which was positive with increased uptake in this  area, have been negative for any adenopathy or evidence of metastatic  disease.  His SUV was greater than 10 in the right middle lobe lesion.  He was referred to Dr. Edwyna Stevenson for consideration of surgical resection.  Dr. Edwyna Stevenson reviewed his films and felt that he would benefit from a VATS  with middle lobectomy at this time.  He explained all risks, benefits  and alternatives of surgery to the patient.  He agreed to proceed.   HOSPITAL COURSE:  Gary Stevenson was admitted to [facility] on  February 06, 2009.  He was taken to the operating room where he underwent  a right VATS with right middle lobectomy as described above.  Please see  previously dictated operative report for complete details of surgery.  He tolerated the procedure well and was transferred to the SICU in  stable condition.  He was kept in the unit for 24 hours  observation and  will be transferred to the step-down unit.  His chest tubes were removed  in the standard fashion.  Followup chest x-rays showed no evidence of  pneumothorax.  He has progressed well postoperatively.  He did have some  postoperative tachycardia with heart rates in the 120s and was started  on a beta-blocker to which he has responded well.  He also was noted to  have a temperature of 100 on postop day #5, but this resolved with  aggressive pulmonary toilet measures.  White blood cell count was stable  and trending downward at that time and a urinalysis was negative.  At  the present time, his incisions are all healing well.  He is ambulating  in the halls without difficulty and is tolerating a regular diet.  His  final pathology revealed moderately differentiated squamous cell  carcinoma (T2a N0).  His most recent labs show hemoglobin 9.3,  hematocrit 26.6, white count 7.8, platelets 358.  Sodium 130, potassium  4.1, BUN 7, creatinine 0.77.  He has been  stable and doing well,  otherwise, and it is felt that at this time he may be discharged home.   DISCHARGE INSTRUCTIONS:  He is asked to refrain from driving, heavy  lifting or strenuous activity.  He may continue ambulating daily and  using his incentive spirometer.  He may shower daily and clean his  incisions with soap and water.  He will continue his same preoperative  diet.   DISCHARGE FOLLOWUP:  He will see Dr. Edwyna Stevenson in the office in 1 week with  a chest x-ray.  In the interim, if he experiences any problems or has  questions, he is asked to contact our office immediately.      Gary Stevenson, P.A.      Ines Bloomer, M.D.  Electronically Signed    GC/MEDQ  D:  02/11/2009  T:  02/12/2009  Job:  [id number]   cc:   Charlaine Dalton. Sherene Sires, MD, FCCP  [facility] Office

## 2010-11-10 NOTE — Op Note (Signed)
NAMEEDGERRIN, CORREIA NO.:  [id number]   MEDICAL RECORD NO.:  [id number]          PATIENT TYPE:  INP   LOCATION:  2312                         FACILITY:  [facility]   PHYSICIAN:  Ines Bloomer, M.D. DATE OF BIRTH:  Mar 27, 1941   DATE OF PROCEDURE:  DATE OF DISCHARGE:                               OPERATIVE REPORT   PREOPERATIVE DIAGNOSIS:  Right middle lobe mass.   POSTOPERATIVE DIAGNOSIS:  Right middle lobe mass.   OPERATIONS PERFORMED:  1. Right video-assisted thoracic surgery.  2. Right thoracotomy.  3. Right middle lobectomy with node dissection.   SURGEON:  Ines Bloomer, MD   ANESTHESIA:  General anesthesia.   DESCRIPTION OF PROCEDURE:  After percutaneous insertion of all  monitoring lines, the patient underwent general anesthesia, was prepped  and draped in the usual sterile manner.  He was turned to the right  lateral thoracotomy position.  A trocar site was made in the anterior  axillary line at the seventh intercostal space.  A 0-degree scope was  inserted and the lesion was seen in the right middle lobe.  A mid sixth  intercostal space incision approximately 6 cm was made.  The latissimus  was partially divided and the serratus was reflected, was split and the  interspace was entered.  Dissection was started in the fissure  dissecting down to the vein and dividing the inferior portion of the  major fissure with the Autosuture, 45 stapler.  We then dissected out  the vein and stapled and divided it with the Autosuture, 2-mm stapler.  The lateral branch of the right middle lobe was dissected out and  stapled and divided with the Autosuture stapler dissecting out several  11R and 10R nodes.  Then, a small medial branch was ligated with 2-0  silk proximally and clipped and divided.  This left the bronchus, with  several more 11R and 10R nodes were dissected free from around the  bronchus, and the bronchus was stapled with a Autosuture, 3.2 mm  stapler.  Finally, the minor fissure was divided with the Autosuture,  4.5 mm stapler with several applications.  The bronchial margin and the  resected margin was negative.  There was squamous cell cancer.  The  chest tube was brought into the trocar site and tied in place with 0  silk.  Single On-Q was inserted in the usual fashion.  Chest was closed  with 2 pericostals, #1 Vicryl in the muscle layer, 2-0 Vicryl in the  subcutaneous tissue, and Dermabond for the skin.  The patient returned  to the recovery room in stable condition.      Ines Bloomer, M.D.  Electronically Signed     DPB/MEDQ  D:  02/06/2009  T:  02/07/2009  Job:  [id number]

## 2010-11-10 NOTE — Letter (Signed)
March 05, 2009   Casimiro Needle B. Sherene Sires, MD, FCCP  [address]  [place], [place] [address]   Re:  Gary Stevenson, Gary Stevenson                DOB:  11-Sep-1940   Dear Dr. Sherene Sires:   The patient came today and has had marked improvement in his chest x-ray  after having a middle lobectomy for a stage IB non-small cell lung  cancer.  He is doing well overall and I have referred him to Dr. Arbutus Ped  for evaluation.  His blood pressure was 122/73, pulse 76, respirations  18, sats were 97%.  Lungs were clear to auscultation and percussion.  His incisions are well healed.  I will see him back again in 6 weeks  with a chest x-ray.   Sincerely,   Ines Bloomer, M.D.  Electronically Signed   DPB/MEDQ  D:  03/05/2009  T:  03/06/2009  Job:  [id number]   cc:   Lajuana Matte, MD

## 2010-11-10 NOTE — H&P (Signed)
Gary Stevenson, LIKES NO.:  [id number]   MEDICAL RECORD NO.:  [id number]          PATIENT TYPE:  INP   LOCATION:  NA                           FACILITY:  [facility]   PHYSICIAN:  Ines Bloomer, M.D. DATE OF BIRTH:  [birth year]   DATE OF ADMISSION:  DATE OF DISCHARGE:                              HISTORY & PHYSICAL   CHIEF COMPLAINT:  Lung mass.   HISTORY OF PRESENT ILLNESS:  This 70 year old African American male was  found to have a right middle lobe lesion on chest x-ray, it was 3.5 cm  in size.  PET scan was positive in this area but negative in other  areas.  Pulmonary function tests showed an FVC of 3.07 with an FEV-1 of  2.12.  He still smokes half pack of cigarettes a day and is trying to  stop.  He has had no hemoptysis, fever, chills, or excessive sputum but  a 15-pound weight loss.   PAST MEDICAL HISTORY:  He takes Aleve and has no allergy.  He has  chronic obstructive pulmonary disease.   FAMILY HISTORY:  Noncontributory.   SOCIAL HISTORY:  He smokes 2-3 cigarettes a day.  He is single and has 7  children.  Occasional alcohol intake.  He is retired.   REVIEW OF SYSTEMS:  CARDIAC:  No angina or atrial fibrillation.  PULMONARY:  No hemoptysis.  GI: No nausea, vomiting, constipation,  diarrhea and no GERD.  GU: No kidney disease, dysuria or frequent  urination.  VASCULAR:  No claudication, DVT, or TIAs.  NEUROLOGICAL:  No  dizziness, headaches, blackouts, or seizures.  MUSCULOSKELETAL:  No  arthritis.  No muscle pain.  PSYCHIATRIC:  No depression or nervousness.  ENT:  No changes in eyesight or hearing.  HEMATOLOGIC:  No problems with  bleeding, clotting disorders or anemia.   PHYSICAL EXAMINATION:  GENERAL:  He is a well-developed [place] American  male in no acute distress.  VITAL SIGNS:  His blood pressure is 163/93, pulse 70, respirations 18,  sats were 98%.  HEENT:  Head is atraumatic.  Eyes:  Pupils are equal and reactive to  light and  accommodation.  Extraocular movements are normal.  Ears:  Tympanic membranes are intact.  Nose:  There is no septal deviation.  Throat without lesion.  NECK:  Supple without thyromegaly.  There is no supraclavicular or  axillary adenopathy.  CHEST:  Clear to auscultation and percussion.  HEART:  Regular sinus rhythm.  No murmurs.  ABDOMEN:  Soft.  No hepatosplenomegaly.  EXTREMITIES:  Pulses are 2+.  There is no clubbing or edema.  NEUROLOGIC:  He is oriented x3.  Sensory and motor intact.  Cranial  nerves intact.   IMPRESSION:  Non-small cell lung cancer stage IA or 2A.   PLAN:  Right VATS right middle lobectomy.      Ines Bloomer, M.D.  Electronically Signed     DPB/MEDQ  D:  02/04/2009  T:  02/05/2009  Job:  [id number]

## 2010-11-13 NOTE — Discharge Summary (Signed)
NAMEKYRUS, HYDE NO.:  [id number]   MEDICAL RECORD NO.:  [id number]          PATIENT TYPE:  INP   LOCATION:  3004                         FACILITY:  [facility]   PHYSICIAN:  Stefani Dama, M.D.  DATE OF BIRTH:  [birth year]   DATE OF ADMISSION:  06/10/2005  DATE OF DISCHARGE:  06/17/2005                                 DISCHARGE SUMMARY   ADMISSION DIAGNOSES:  1.  Traumatic subdural hematoma, subacute and acute.  2.  Seizure disorder.   DISCHARGE DIAGNOSES:  1.  Acute and subacute subdural hematoma.  2.  Seizure disorder.  3.  Aspiration pneumonia.   HOSPITAL COURSE:  Mr. Niblett is a 70 year old individual who apparently had  had a seizure. He sustained a subdural hematoma that was witnessed by his  girlfriend on the day of admission, reported to [facility]. He  complained of right-sided headache. He denied any significant history of  trauma in the past. He was found to have a large subacute, subdural hematoma  in the right temporoparietal region. He was taken to the operating room on  the day of admission. Craniotomy was performed. Subdural hematoma was  drained. A drain was left in the subdural space. This was allowed to remain  in place for about 36 hours. Thereafter the patient was gradually mobilized.  His neurologic status remained intact during the postoperative period. He  was started on ambulation. On the 3rd postoperative day the patient was  being readied for discharge when he developed to 102. He had some rigors.  Chest x-ray demonstrated right upper lobe pneumonia felt to be consistent  with possibly pneumonia from aspiration at the time of his seizure. The  patient was started on Biaxin. He responded to this and on the afternoon of  June 16, 2005, he became afebrile. He has been feeling steadily  improved. His Dilantin level had been low at 100 mg t.i.d. and his dose was  increased to 200 mg b.i.d. The patient is discharged home on  Biaxin 500 mg  b.i.d. for five days. Sutures have been removed from the scalp and  neurologically  he remained stable. He will be seen in the office in three  weeks time for further follow-up.      Stefani Dama, M.D.  Electronically Signed     HJE/MEDQ  D:  06/16/2005  T:  06/18/2005  Job:  [id number]

## 2010-11-13 NOTE — Consult Note (Signed)
NAME:  Gary Stevenson, Gary Stevenson NO.:  [id number]   MEDICAL RECORD NO.:  [id number]          PATIENT TYPE:  EMS   LOCATION:  MAJO                         FACILITY:  [facility]   PHYSICIAN:  Cristi Loron, M.D.DATE OF BIRTH:  [birth year]   DATE OF CONSULTATION:  06/10/2005  DATE OF DISCHARGE:                                   CONSULTATION   CHIEF COMPLAINT:  Seizure.   HISTORY OF PRESENT ILLNESS:  The patient is a 70 year old black male who has  been having some headaches.  He had an approximately 10 minute seizure  witnessed by his girlfriend at approximately 17:30 this evening.  EMS was  called and the patient was transported to [facility] where he was  evaluated by Dr. Bruce Donath to include the cranial CT scan.  The CT scan  demonstrated a right subdural hematoma and a neurosurgical consultation was  requested.   Presently, the patient complains of right-sided headache.  His girlfriend  tells me that, as above, he had a seizure for about 10 minutes and was quite  somnolent for sometime afterwards.  The patient does not recall any history  of trauma.   PAST MEDICAL HISTORY:  Negative.   PAST SURGICAL HISTORY:  None.   MEDICATIONS:  Prior to admission, none.   ALLERGIES:  No known drug allergies.   FAMILY HISTORY:  Noncontributory.   SOCIAL HISTORY:  The patient is single.  He has 4 children.  He is not  employed.  He lives in [place].  He smokes 1/4 pack per day of  cigarettes.  He has done this for 50 years.  I advised him to quit.  He  drinks alcohol socially.   REVIEW OF SYSTEMS:  Negative, except as above.  Denies chest pain, neck  pain, back pain, etc.   PHYSICAL EXAMINATION:  GENERAL APPEARANCE:  In general, he is a pleasant 63-  year-old black male complaining of right headache.  HEENT:  Normocephalic, atraumatic.  Pupils are equal, round and reactive to  light.  Extraocular muscles intact.  Oropharynx benign.  Uvula midline.  NECK:   Supple.  There is no masses, deformities, tracheal deviation or  jugular venous distention.  He has a mildly limited cervical range of  motion, but appropriate for his age.  Spurling testing is negative.  Lhermitte sign was not present.  THORAX:  Symmetric.  HEART:  Regular rate and rhythm.  ABDOMEN:  Soft, nontender.  EXTREMITIES:  No obvious deformities.  Back exam is normal.  NEUROLOGIC EXAM:  The patient is alert and oriented x3.  Cranial nerves II-  XII are examined and bilaterally grossly normal.  Vision and hearing are  grossly normal bilaterally.  Motor strength is 5/5 in his deltoid, biceps,  triceps, hand grip, psoas, quadriceps, gastrocnemius, extensor hallucis  longus.  Deep tendon reflexes are 2/4 in both biceps, triceps, quadriceps.  There was no ankle clonus.  Sensory function is intact to light touch  sensation in all tested dermatomes bilaterally.  Cerebellar function is  intact to rapid alternating movements of the moves in the upper  extremities  bilaterally.   IMAGING STUDIES:  I have reviewed the patient's brain CT performed without  contrast at [facility] on June 10, 2005 and this demonstrates  an approximately 1 cm right subacute subdural hematoma with some acute blood  with approximately 8 mm of midline shift and moderate mass effect.   ASSESSMENT AND PLAN:  Right subdural hematoma.  I have discussed the  situation with the patient and his girlfriend (at the patient's request).  I  have discussed the various treatment options including doing  nothing/observation versus surgery.  I recommended the latter as this  patient is already symptomatic.  I described the procedure of a right  craniotomy for evacuation of the subdural hematoma.  I described that  surgery to them.  I discussed the risks of surgery and the risks of  anesthesia, hemorrhage, infection, seizure, stroke, death because of  dermatoma, etc.  I have answered all of their questions.  The  patient has  weighed the risks, benefits and alternatives of surgery and would like to  proceed with the operation and we will plan to do this later on tonight.      Cristi Loron, M.D.  Electronically Signed     JDJ/MEDQ  D:  06/10/2005  T:  06/13/2005  Job:  [id number]

## 2010-11-13 NOTE — Op Note (Signed)
Gary Stevenson, Gary Stevenson NO.:  [id number]   MEDICAL RECORD NO.:  [id number]          PATIENT TYPE:  INP   LOCATION:  3104                         FACILITY:  [facility]   PHYSICIAN:  Cristi Loron, M.D.DATE OF BIRTH:  27-Nov-1940   DATE OF PROCEDURE:  06/11/2005  DATE OF DISCHARGE:                                 OPERATIVE REPORT   BRIEF HISTORY:  The patient is a 70 year old black male who presented to the  emergency department with a seizure.  Brain CT scan demonstrated he had a  right subacute and acute subdural hematoma with significant mass effect.  I  discussed the various treatment options with the patient and his girlfriend  and recommend surgery.  The patient has weighed the risks, benefits, and  alternatives to surgery and decided to proceed with the operation.   PREOPERATIVE DIAGNOSIS:  Right subacute and acute subdural hematoma.   POSTOPERATIVE DIAGNOSIS:  Right subacute and acute subdural hematoma.   PROCEDURE:  Right frontotemporoparietal craniotomy for evacuation of right  subdural hematoma and placement of a 6 mm flat Jackson-Pratt drain in the  subdural space.   SURGEON:  Cristi Loron, M.D.   ASSISTANT:  None.   ANESTHESIA:  General endotracheal.   ESTIMATED BLOOD LOSS:  50 cubic centimeters.   SPECIMENS:  None.   DRAINS:  One subdural drain.   COMPLICATIONS:  None.   DESCRIPTION OF PROCEDURE:  The patient was brought to the operating room by  the Anesthesia team.  General endotracheal anesthesia was induced.  The  patient remained in the supine position.  A roll was placed under his right  shoulder and his calvarium was supported in the Mayfield 3-point headrest.  His head was turned to the left, exposing his right scalp.  This was  prepared with Betadine scrub and Betadine solution.  Sterile drapes were  applied.  I then injected the area to be incised with Marcaine with  epinephrine solution.  I used a scalpel to make a linear  incision just above  the right tragus.  I used electrocautery to divide the temporalis fascia  muscle and then the periosteal elevator to expose the underlying calvarium.  I used the cerebellar retractor for exposure.  I then used a high-speed a  drill to create a right temporal bur hole.  I then used a footplate device  to create a craniotomy flap.  I elevated the craniotomy flap with the  Penfield #3 and #1.  This exposed the underlying dura was somewhat tense.  I  then incised the dura in a cruciate fashion and tacked up the dural edges  with 4-0 nylon suture.  There was a subdural membrane beneath the dura.  I  coagulated the subdural membrane and upon doing this, there was a release of  subacute blood under high pressure.  I then irrigated out the subdural space  with saline solution.  There were some more acute blood clots which rinsed  out easily.  I then irrigated the subdural space out until the irrigant came  back crystal clear.  I did not identify  any clear bleeding points.  I then  placed a 6 mm flat Jackson-Pratt drain in the subdural space.  I tunneled  out through the craniotomy and then tunneled out through a separate stab  wound through the scalp.  I then reapproximated the patient's dura with  interrupted 4-0 nylon suture, then laid a piece of Gelfoam over the exposed  dura, and then replaced the patient's craniotomy flap with titanium mini  plates and screws.  I then removed the cerebellar retractor and then  reapproximated the patient's temporalis muscle and fascia with interrupted 2-  0 Vicryl suture, the galea with interrupted 2-0 Vicryl suture, and the skin  with stainless steel staples.  The wound was then covered with Bacitracin  ointment, a sterile dressing applied, and the drapes were removed.  The  Mayfield 3-point headrest was removed from the patient's scalp area.  He was  subsequently extubated by the Anesthesia team and transported to the  postanesthesia care  unit in stable condition.  All sponge, instrument, and  needle counts were correct at the end of this case.      Cristi Loron, M.D.  Electronically Signed     JDJ/MEDQ  D:  06/11/2005  T:  06/14/2005  Job:  [id number]

## 2010-12-09 ENCOUNTER — Other Ambulatory Visit: Payer: Self-pay | Admitting: Oral Surgery

## 2011-01-18 ENCOUNTER — Other Ambulatory Visit: Payer: Self-pay | Admitting: *Deleted

## 2011-08-29 ENCOUNTER — Encounter (HOSPITAL_COMMUNITY): Payer: Self-pay | Admitting: Emergency Medicine

## 2011-08-29 ENCOUNTER — Emergency Department (HOSPITAL_COMMUNITY): Payer: Medicare Other

## 2011-08-29 ENCOUNTER — Emergency Department (HOSPITAL_COMMUNITY)
Admission: EM | Admit: 2011-08-29 | Discharge: 2011-08-29 | Disposition: A | Discharge status: Home / Self Care | Payer: Medicare Other | Attending: Emergency Medicine | Admitting: Emergency Medicine

## 2011-08-29 DIAGNOSIS — S0181XA Laceration without foreign body of other part of head, initial encounter: Secondary | ICD-10-CM

## 2011-08-29 DIAGNOSIS — W1809XA Striking against other object with subsequent fall, initial encounter: Secondary | ICD-10-CM | POA: Insufficient documentation

## 2011-08-29 DIAGNOSIS — S0180XA Unspecified open wound of other part of head, initial encounter: Secondary | ICD-10-CM | POA: Insufficient documentation

## 2011-08-29 DIAGNOSIS — H5789 Other specified disorders of eye and adnexa: Secondary | ICD-10-CM | POA: Insufficient documentation

## 2011-08-29 DIAGNOSIS — M5090 Cervical disc disorder, unspecified, unspecified cervical region: Secondary | ICD-10-CM | POA: Insufficient documentation

## 2011-08-29 DIAGNOSIS — S0990XA Unspecified injury of head, initial encounter: Secondary | ICD-10-CM | POA: Insufficient documentation

## 2011-08-29 IMAGING — CT CT CERVICAL SPINE W/O CM
2 of 4 series · 5 of 14 positions shown, 6 images · non-contrast
Comparison: Head CT [DATE]

CT HEAD

CLINICAL DATA: Fall, head injury

CT HEAD WITHOUT CONTRAST
CT CERVICAL SPINE WITHOUT CONTRAST
TECHNIQUE: Multidetector CT imaging of the head and cervical spine
was performed following the standard protocol without intravenous
contrast.  Multiplanar CT image reconstructions of the cervical
spine were also generated.

[Series 3: c-spine st · axial · 0.25mm/px · z∈[-228,-178]mm · 2 of 75 slices shown]
[im 25/75  bone]
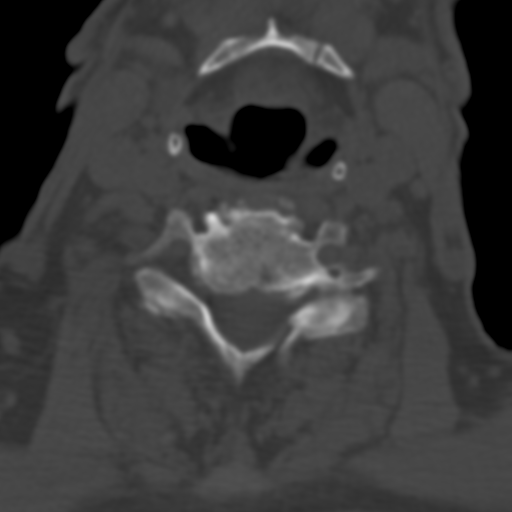
[im 50/75  bone]
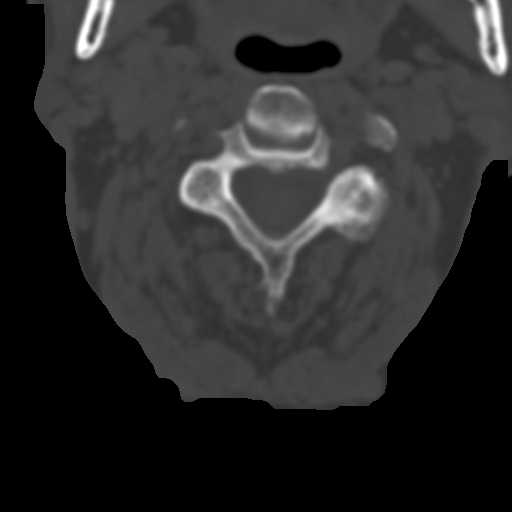

[Series 8: axial reformats · axial · 0.23mm/px · z∈[-264,-193]mm · 3 of 83 slices shown, 4 images]
[im 21/83  soft-tissue]
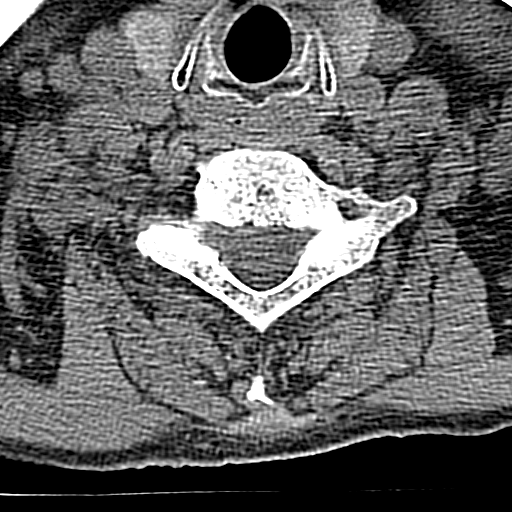
[im 21/83  bone]
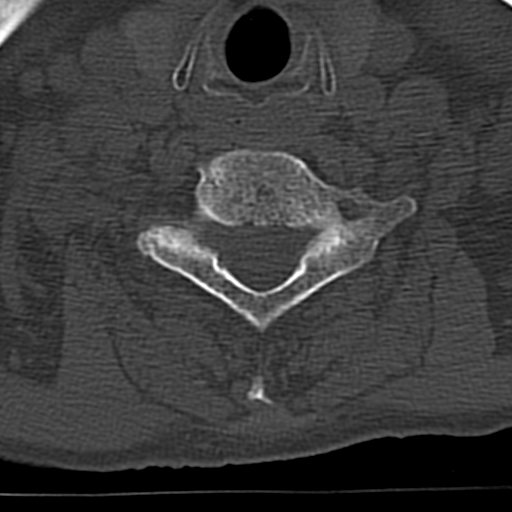
[im 42/83  bone]
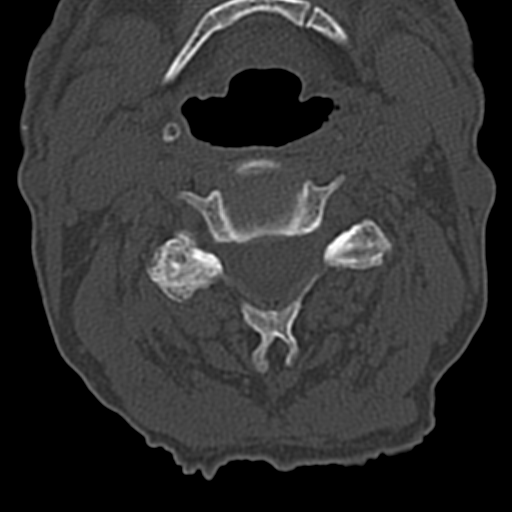
[im 62/83  bone]
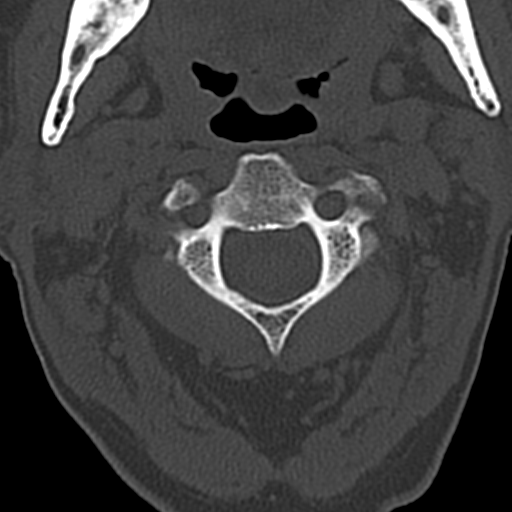

[5 of 14 positions shown; findings below may reference images not displayed]

FINDINGS: No intracranial hemorrhage.  No parenchymal contusion.
No midline shift or mass effect.  Basilar cisterns are patent. No
skull base fracture.  No fluid in the paranasal sinuses or mastoid
air cells. There is atrophy and periventricular white matter
disease.

Craniotomy flap in the frontal bone.  No acute fracture.  No fluid
in the paranasal sinuses.
IMPRESSION: No acute intracranial trauma.

Postsurgical change in the right frontal region

CT CERVICAL SPINE
FINDINGS: No prevertebral soft tissue swelling.  Normal alignment
of cervical vertebral bodies.  No loss of vertebral body height.
Normal facet articulation.  Normal craniocervical junction.

There is disc space loss at C6 - C7.  There straightening of the
normal cervical lordosis.  There is multilevel endplate
osteophytosis most severe C6-C7.

No evidence epidural or paraspinal hematoma.
IMPRESSION: 1..  No evidence of cervical spine fracture.
2.  Disc osteophytic disease most severe at  C6-C7.
3. Straightening of the normal cervical lordosis may be secondary
to position, muscle spasm, or ligamentous injury.

## 2011-08-29 MED ORDER — TETANUS-DIPHTH-ACELL PERTUSSIS 5-2.5-18.5 LF-MCG/0.5 IM SUSP
0.5000 mL | Freq: Once | INTRAMUSCULAR | Status: AC
  Administered 2011-08-29: 0.5 mL via INTRAMUSCULAR
  Filled 2011-08-29: qty 0.5

## 2011-08-29 MED ORDER — TOBRAMYCIN 0.3 % OP SOLN
2.0000 [drp] | Freq: Once | OPHTHALMIC | Status: AC
  Administered 2011-08-29: 2 [drp] via OPHTHALMIC
  Filled 2011-08-29: qty 5

## 2011-08-29 MED ORDER — CEPHALEXIN 500 MG PO CAPS: 500.0000 mg | ORAL_CAPSULE | Freq: Four times a day (QID) | ORAL | Status: AC

## 2011-08-29 MED ORDER — LIDOCAINE-EPINEPHRINE (PF) 2 %-1:200000 IJ SOLN: 10.0000 mL | Freq: Once | INTRAMUSCULAR | Status: DC

## 2011-08-29 NOTE — ED Provider Notes (Signed)
History     CSN: [id number]  Arrival date & time 08/29/11  1010   First MD Initiated Contact with Patient 08/29/11 1027      10:31 AM HPI Patient reports Friday night he was intoxicated with alcohol. Reports he fell and hit his head on the corner of coffee table. Has a large laceration to the right superior eyebrow. Has a Band-Aid over it and bleeding has stopped. States his family made him come due to size of laceration. Denies significant pain, headache, numbness, tingling, aphasia, ataxia, confusion, neck pain.  Patient is a 71 y.o. male presenting with scalp laceration. The history is provided by the patient.  Head Laceration This is a new problem. The problem has been unchanged. Pertinent negatives include no fever, neck pain, numbness, visual change, vomiting or weakness.    History reviewed. No pertinent past medical history.  History reviewed. No pertinent past surgical history.  History reviewed. No pertinent family history.  History  Substance Use Topics  . Smoking status: Former Games developer  . Smokeless tobacco: Not on file  . Alcohol Use: Yes     "I drink whenever I have money"      Review of Systems  Constitutional: Negative for fever.  HENT: Negative for neck pain.   Gastrointestinal: Negative for vomiting.  Skin: Positive for wound (laceration). Negative for color change.  Neurological: Negative for weakness and numbness.  All other systems reviewed and are negative.    Allergies  Review of patient's allergies indicates no known allergies.  Home Medications  No current outpatient prescriptions on file.  BP 158/102  Pulse 80  Temp(Src) 98.6 F (37 C) (Oral)  Resp 20  SpO2 95%  Physical Exam  Constitutional: He is oriented to person, place, and time. He appears well-developed and well-nourished.  HENT:  Head: Normocephalic. Head is with laceration (3cm gaping laceration with flap. Hemostatic).    Right Ear: Tympanic membrane, external ear and ear  canal normal. No hemotympanum.  Left Ear: Tympanic membrane, external ear and ear canal normal. No hemotympanum.  Eyes: EOM are normal. Pupils are equal, round, and reactive to light. Right eye exhibits discharge. Left eye exhibits discharge. Right conjunctiva is injected. Left conjunctiva is injected.       No visual changes  Neck: Normal range of motion. Neck supple. No spinous process tenderness and no muscular tenderness present. No rigidity. Normal range of motion present.  Cardiovascular: Normal rate, regular rhythm and normal heart sounds.  Exam reveals no gallop and no friction rub.   No murmur heard. Pulmonary/Chest: Effort normal and breath sounds normal. He has no wheezes. He has no rales. He exhibits no tenderness.  Abdominal: Soft. Bowel sounds are normal.  Neurological: He is alert and oriented to person, place, and time. No cranial nerve deficit. He exhibits normal muscle tone. Coordination normal.  Skin: Skin is warm and dry. No rash noted. No erythema. No pallor.  Psychiatric: He has a normal mood and affect. His behavior is normal.    ED Course  Wound repair Date/Time: 08/29/2011 12:26 PM Performed by: Thomasene Lot Authorized by: Thomasene Lot Consent: Verbal consent obtained. Consent given by: patient Imaging studies: imaging studies available Patient identity confirmed: verbally with patient Time out: Immediately prior to procedure a "time out" was called to verify the correct patient, procedure, equipment, support staff and site/side marked as required. Preparation: Patient was prepped and draped in the usual sterile fashion. Local anesthesia used: yes Local anesthetic: lidocaine 2% with epinephrine Anesthetic  total: 3 ml Patient sedated: no Patient tolerance: Patient tolerated the procedure well with no immediate complications. Comments: Placed for 6-0 Prolene sutures loosely and 2 large for head laceration. Laceration approximately 3 cm with a flap. Bandaged  with Bacitracin and tefla.     MDM   Discussed suture removal in 5 days. Advised patient and  friend that we're only able to loosely suture the wound since it has been greater than 24 hours. However since wound was gaping and causing ptosis we had to loosely tack the eyebrow up. Patient also has bilateral conjunctivitis. We'll treat with tobramycin solution. Advised monitoring laceration daily for infection. We'll place on oral antibiotics and advised return for worsening symptoms for infection. Patient and friend agree with plan and are ready for discharge.       Thomasene Lot, PA-C 08/29/11 1231

## 2011-08-29 NOTE — ED Notes (Signed)
Pt presenting to ed with c/o falling on Friday with laceration to forehead. Pt states "I was drunk and fell and hit the coffee table. Pt is alert and oriented at this time.

## 2011-08-29 NOTE — Discharge Instructions (Signed)
Facial Laceration  A facial laceration is a cut on the face. Lacerations usually heal quickly, but they need special care to reduce scarring. It will take 1 to 2 years for the scar to lose its redness and to heal completely.  TREATMENT   Some facial lacerations may not require closure. Some lacerations may not be able to be closed due to an increased risk of infection. It is important to see your caregiver as soon as possible after an injury to minimize the risk of infection and to maximize the opportunity for successful closure.  If closure is appropriate, pain medicines may be given, if needed. The wound will be cleaned to help prevent infection. Your caregiver will use stitches (sutures), staples, wound glue (adhesive), or skin adhesive strips to repair the laceration. These tools bring the skin edges together to allow for faster healing and a better cosmetic outcome. However, all wounds will heal with a scar.   Once the wound has healed, scarring can be minimized by covering the wound with sunscreen during the day for 1 full year. Use a sunscreen with an SPF of at least 30. Sunscreen helps to reduce the pigment that will form in the scar. When applying sunscreen to a completely healed wound, massage the scar for a few minutes to help reduce the appearance of the scar. Use circular motions with your fingertips, on and around the scar. Do not massage a healing wound.  HOME CARE INSTRUCTIONS  For sutures:   Keep the wound clean and dry.   If you were given a bandage (dressing), you should change it at least once a day. Also change the dressing if it becomes wet or dirty, or as directed by your caregiver.   Wash the wound with soap and water 2 times a day. Rinse the wound off with water to remove all soap. Pat the wound dry with a clean towel.   After cleaning, apply a thin layer of the antibiotic ointment recommended by your caregiver. This will help prevent infection and keep the dressing from sticking.   You  may shower as usual after the first 24 hours. Do not soak the wound in water until the sutures are removed.   Only take over-the-counter or prescription medicines for pain, discomfort, or fever as directed by your caregiver.   Get your sutures removed as directed by your caregiver. With facial lacerations, sutures should usually be taken out after 4 to 5 days to avoid stitch marks.   Wait a few days after your sutures are removed before applying makeup.  For skin adhesive strips:   Keep the wound clean and dry.   Do not get the skin adhesive strips wet. You may bathe carefully, using caution to keep the wound dry.   If the wound gets wet, pat it dry with a clean towel.   Skin adhesive strips will fall off on their own. You may trim the strips as the wound heals. Do not remove skin adhesive strips that are still stuck to the wound. They will fall off in time.  For wound adhesive:   You may briefly wet your wound in the shower or bath. Do not soak or scrub the wound. Do not swim. Avoid periods of heavy perspiration until the skin adhesive has fallen off on its own. After showering or bathing, gently pat the wound dry with a clean towel.   Do not apply liquid medicine, cream medicine, ointment medicine, or makeup to your wound while the   skin adhesive is in place. This may loosen the film before your wound is healed.   If a dressing is placed over the wound, be careful not to apply tape directly over the skin adhesive. This may cause the adhesive to be pulled off before the wound is healed.   Avoid prolonged exposure to sunlight or tanning lamps while the skin adhesive is in place. Exposure to ultraviolet light in the first year will darken the scar.   The skin adhesive will usually remain in place for 5 to 10 days, then naturally fall off the skin. Do not pick at the adhesive film.  You may need a tetanus shot if:   You cannot remember when you had your last tetanus shot.   You have never had a tetanus  shot.  If you get a tetanus shot, your arm may swell, get red, and feel warm to the touch. This is common and not a problem. If you need a tetanus shot and you choose not to have one, there is a rare chance of getting tetanus. Sickness from tetanus can be serious.  SEEK IMMEDIATE MEDICAL CARE IF:   You develop redness, pain, or swelling around the wound.   There is yellowish-white fluid (pus) coming from the wound.   You develop chills or a fever.  MAKE SURE YOU:   Understand these instructions.   Will watch your condition.   Will get help right away if you are not doing well or get worse.  Document Released: 07/22/2004 Document Revised: 06/03/2011 Document Reviewed: 12/07/2010  [employer] Patient Information 2012 [employer]

## 2011-08-29 NOTE — ED Notes (Signed)
WUJ:[id number]<JY> Expected date:08/29/11<BR> Expected time:10:05 AM<BR> Means of arrival:Ambulance<BR> Comments:<BR> M61. 71 yo m. Fall on Friday, lac to eyebrow, still bleeding and open. Stable. 10 mins

## 2011-08-31 NOTE — ED Provider Notes (Signed)
Medical screening examination/treatment/procedure(s) were performed by non-physician practitioner and as supervising physician I was immediately available for consultation/collaboration.  Toy Baker, MD 08/31/11 [phone]

## 2011-09-04 ENCOUNTER — Emergency Department (INDEPENDENT_AMBULATORY_CARE_PROVIDER_SITE_OTHER)
Admission: EM | Admit: 2011-09-04 | Discharge: 2011-09-04 | Disposition: A | Discharge status: Home / Self Care | Payer: Medicare Other | Source: Home / Self Care | Attending: Family Medicine | Admitting: Family Medicine

## 2011-09-04 ENCOUNTER — Encounter (HOSPITAL_COMMUNITY): Payer: Self-pay | Admitting: Emergency Medicine

## 2011-09-04 DIAGNOSIS — Z4802 Encounter for removal of sutures: Secondary | ICD-10-CM

## 2011-09-04 DIAGNOSIS — IMO0002 Reserved for concepts with insufficient information to code with codable children: Secondary | ICD-10-CM

## 2011-09-04 DIAGNOSIS — X58XXXA Exposure to other specified factors, initial encounter: Secondary | ICD-10-CM

## 2011-09-04 DIAGNOSIS — T148XXA Other injury of unspecified body region, initial encounter: Secondary | ICD-10-CM

## 2011-09-04 NOTE — ED Provider Notes (Signed)
History     CSN: [id number]  Arrival date & time 09/04/11  1737   First MD Initiated Contact with Patient 09/04/11 1801      Chief Complaint  Patient presents with  . Suture / Staple Removal    (Consider location/radiation/quality/duration/timing/severity/associated sxs/prior treatment) HPI Comments: Gary Stevenson presents for removal of 4 sutures from above his RIGHT eye. He sustained a fall last Friday night, while going to the bathroom. He did not seek care until Sunday night at [facility]. He received 4 sutures and returns today for removal and evaluation. He denies any complaints.   Patient is a 71 y.o. male presenting with suture removal. The history is provided by the patient.  Suture / Staple Removal  The sutures were placed 3 to 6 days ago. Treatments since wound repair include antibiotic ointment use and regular soap and water washings. There has been no drainage from the wound. There is no redness present. There is no swelling present. The pain has no pain.    No past medical history on file.  No past surgical history on file.  No family history on file.  History  Substance Use Topics  . Smoking status: Former Games developer  . Smokeless tobacco: Not on file  . Alcohol Use: Yes     "I drink whenever I have money"      Review of Systems  Constitutional: Negative.   HENT: Negative.   Eyes: Negative.   Respiratory: Negative.   Cardiovascular: Negative.   Gastrointestinal: Negative.   Genitourinary: Negative.   Musculoskeletal: Negative.   Skin: Positive for wound.  Neurological: Negative.     Allergies  Review of patient's allergies indicates no known allergies.  Home Medications   Current Outpatient Rx  Name Route Sig Dispense Refill  . CEPHALEXIN 500 MG PO CAPS Oral Take 1 capsule (500 mg total) by mouth 4 (four) times daily. 28 capsule 0    BP 185/100  Pulse 85  Temp(Src) 99.5 F (37.5 C) (Oral)  Resp 18  SpO2 96%  Physical Exam  Nursing note  and vitals reviewed. Constitutional: He is oriented to person, place, and time. He appears well-developed and well-nourished.  HENT:  Head: Normocephalic. Head is with laceration.         Well-healed laceration with 4 sutures in place over RIGHT eyebrow  Eyes: EOM are normal.  Neck: Normal range of motion.  Pulmonary/Chest: Effort normal.  Musculoskeletal: Normal range of motion.  Neurological: He is alert and oriented to person, place, and time.  Skin: Skin is warm and dry.  Psychiatric: His behavior is normal.    ED Course  Procedures (including critical care time)  Labs Reviewed - No data to display No results found.   1. Dressing change/suture removal   2. Laceration       MDM  4 sutures removed without complication        Renaee Munda, MD 09/04/11 1940

## 2011-09-04 NOTE — Discharge Instructions (Signed)
4 sutures were removed. Keep wound clean and dry with warm water and soap. You do not need to apply anything to it anymore. Keep it clean and dry. Return to care should your symptoms not improve, or worsen in any way, such as increased redness, warmth, drainage, or pain.

## 2011-09-04 NOTE — ED Notes (Signed)
Patient in department for suture removal, sutures to face, site unremarkable.

## 2012-01-29 ENCOUNTER — Encounter (HOSPITAL_COMMUNITY): Payer: Self-pay

## 2012-01-29 ENCOUNTER — Emergency Department (HOSPITAL_COMMUNITY): Payer: Medicare Other

## 2012-01-29 ENCOUNTER — Emergency Department (HOSPITAL_COMMUNITY)
Admission: EM | Admit: 2012-01-29 | Discharge: 2012-01-29 | Disposition: A | Discharge status: Home / Self Care | Payer: Medicare Other | Attending: Emergency Medicine | Admitting: Emergency Medicine

## 2012-01-29 DIAGNOSIS — S0181XA Laceration without foreign body of other part of head, initial encounter: Secondary | ICD-10-CM

## 2012-01-29 DIAGNOSIS — S0180XA Unspecified open wound of other part of head, initial encounter: Secondary | ICD-10-CM | POA: Insufficient documentation

## 2012-01-29 DIAGNOSIS — W010XXA Fall on same level from slipping, tripping and stumbling without subsequent striking against object, initial encounter: Secondary | ICD-10-CM | POA: Insufficient documentation

## 2012-01-29 DIAGNOSIS — Z87891 Personal history of nicotine dependence: Secondary | ICD-10-CM | POA: Insufficient documentation

## 2012-01-29 DIAGNOSIS — F1010 Alcohol abuse, uncomplicated: Secondary | ICD-10-CM | POA: Insufficient documentation

## 2012-01-29 DIAGNOSIS — J438 Other emphysema: Secondary | ICD-10-CM | POA: Insufficient documentation

## 2012-01-29 DIAGNOSIS — Z23 Encounter for immunization: Secondary | ICD-10-CM | POA: Insufficient documentation

## 2012-01-29 DIAGNOSIS — G319 Degenerative disease of nervous system, unspecified: Secondary | ICD-10-CM | POA: Insufficient documentation

## 2012-01-29 DIAGNOSIS — S0990XA Unspecified injury of head, initial encounter: Secondary | ICD-10-CM

## 2012-01-29 DIAGNOSIS — M47812 Spondylosis without myelopathy or radiculopathy, cervical region: Secondary | ICD-10-CM | POA: Insufficient documentation

## 2012-01-29 HISTORY — DX: Alcohol abuse, uncomplicated: F10.10

## 2012-01-29 LAB — GLUCOSE, CAPILLARY: Glucose-Capillary: 96 mg/dL (ref 70–99)

## 2012-01-29 IMAGING — CT CT CERVICAL SPINE W/O CM
1 of 8 series · 3 of 14 positions shown, 4 images · non-contrast
Comparison: CT head and cervical spine [DATE].

CT HEAD

CLINICAL DATA: Alcohol intoxication.  Status post fall with
abrasions to the forehead and face..

CT HEAD WITHOUT CONTRAST
CT CERVICAL SPINE WITHOUT CONTRAST
TECHNIQUE: Multidetector CT imaging of the head and cervical spine
was performed following the standard protocol without intravenous
contrast.  Multiplanar CT image reconstructions of the cervical
spine were also generated.

[Series 6: c-spine st · axial · 0.25mm/px · z∈[-355,-169]mm · 3 of 94 slices shown, 4 images]
[im 1/94  soft-tissue]
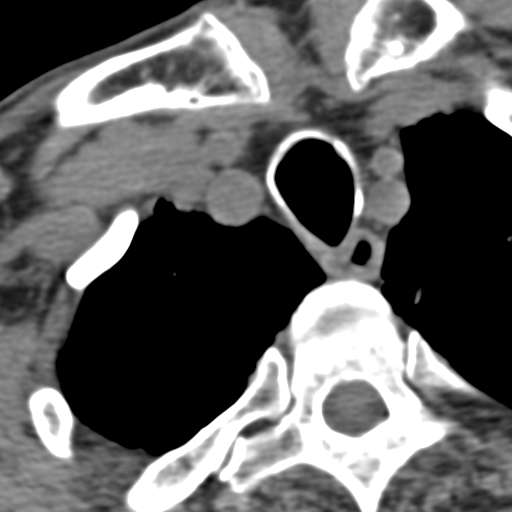
[im 1/94  bone]
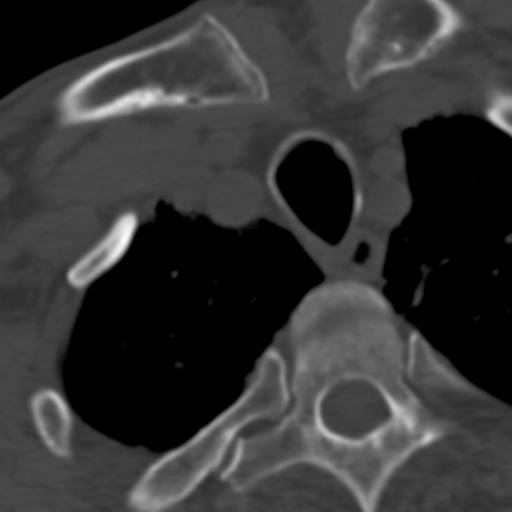
[im 47/94  bone]
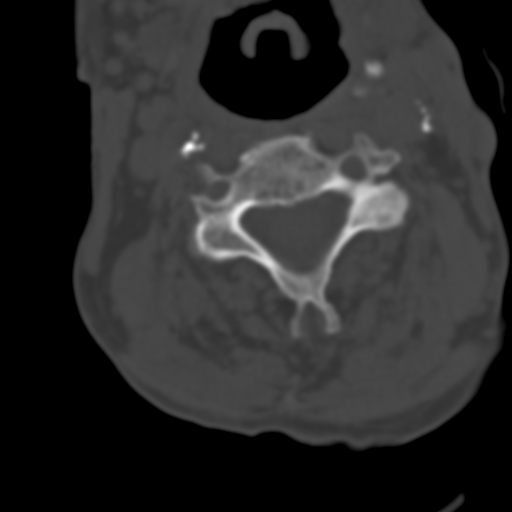
[im 94/94  bone]
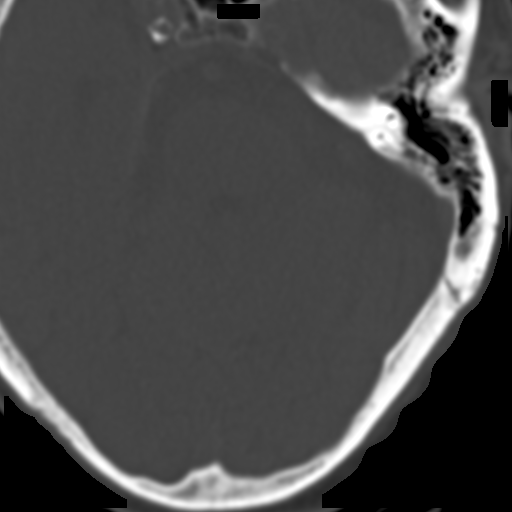

[3 of 14 positions shown; findings below may reference images not displayed]

FINDINGS: The study is mildly degraded by patient motion.  The
patient was scanned several times to include the entire head.

No acute cortical infarct, hemorrhage, or mass lesion is present.
Atrophy and extensive age advanced white matter disease is present,
as before.  The ventricles are proportionate to the degree of
atrophy.  No significant extra-axial fluid collection is present.

Frontal scalp soft tissue swelling is present.  No acute underlying
fracture is present.  The patient is status post right frontal
craniotomy.  Wall thickening in the right maxillary sinus is
stable, compatible with a history of chronic sinus disease.  No
acute sinus disease is present.  The paranasal sinuses and mastoid
air cells are clear.
IMPRESSION: 1.  No acute intracranial abnormality or significant interval
change.
2.  Stable atrophy and extensive white matter disease.
3.  Soft tissue swelling over the frontal scalp without an
underlying fracture.

CT CERVICAL SPINE
FINDINGS: The cervical spine is imaged from the skull base through
T2-3.  The vertebral body heights and alignment are maintained.
Fusion of anterior osteophytes at C6-7 is stable.  Multilevel
degenerative changes are stable.  No acute fracture or traumatic
subluxation is evident.

Soft tissues again demonstrate atherosclerotic calcifications at
the carotid bifurcations bilaterally.  Emphysematous changes are
noted at the lung apices.
IMPRESSION: 1.  No acute fracture or traumatic subluxation.

2.  Stable spondylosis of the cervical spine.
3.  Emphysema.
4.  Atherosclerosis.

## 2012-01-29 MED ORDER — TETANUS-DIPHTH-ACELL PERTUSSIS 5-2.5-18.5 LF-MCG/0.5 IM SUSP
0.5000 mL | Freq: Once | INTRAMUSCULAR | Status: AC
  Administered 2012-01-29: 0.5 mL via INTRAMUSCULAR
  Filled 2012-01-29: qty 0.5

## 2012-01-29 NOTE — ED Provider Notes (Addendum)
History     CSN: [id number]  Arrival date & time 01/29/12  1805   First MD Initiated Contact with Patient 01/29/12 1807      Chief Complaint  Patient presents with  . Alcohol Intoxication  . Fall  . Facial Laceration  . Loss of Consciousness     The history is provided by the patient.   the patient reports she's been drinking alcohol today and he tripped on his shoes.  He struck the ground with his forehead.  He has a laceration to his forehead.  He reports loss of consciousness.  He denies neck pain weakness in his upper lower extremities.  This was a ground-level fall.  He denies pain in his upper lower extremities.  He has no chest pain shortness of breath.  His abdominal pain.  He is unsure of his tetanus status.  Past Medical History  Diagnosis Date  . ETOH abuse     intoxicated    No past surgical history on file.  No family history on file.  History  Substance Use Topics  . Smoking status: Former Games developer  . Smokeless tobacco: Not on file  . Alcohol Use: Yes     "I drink whenever I have money"      Review of Systems  Cardiovascular: Positive for syncope.  All other systems reviewed and are negative.    Allergies  Review of patient's allergies indicates no known allergies.  Home Medications  No current outpatient prescriptions on file.  There were no vitals taken for this visit.  Physical Exam  Nursing note and vitals reviewed. Constitutional: He is oriented to person, place, and time. He appears well-developed and well-nourished.  HENT:  Head: Normocephalic and atraumatic.       Laceration overlying left eyebrow.  Extraocular movements are intact.  No trismus or malocclusion.  Edentulous.  Mild swelling of left upper lip without laceration.  Although abrasions to nose without deformity of nostrils.  No epistaxis noted the  Eyes: EOM are normal.  Neck:       Immobilized in cervical collar.  Mild C-spine and paracervical spinal tenderness    Cardiovascular: Normal rate, regular rhythm, normal heart sounds and intact distal pulses.   Pulmonary/Chest: Effort normal and breath sounds normal. No respiratory distress.  Abdominal: Soft. He exhibits no distension. There is no tenderness.  Musculoskeletal: Normal range of motion.  Neurological: He is alert and oriented to person, place, and time.  Skin: Skin is warm and dry.  Psychiatric: He has a normal mood and affect. Judgment normal.    ED Course  Procedures   LACERATION REPAIR Performed by: Lyanne Co Consent: Verbal consent obtained. Risks and benefits: risks, benefits and alternatives were discussed Patient identity confirmed: provided demographic data Time out performed prior to procedure Prepped and Draped in normal sterile fashion Wound explored Laceration Location: Left eyebrow Laceration Length: 3 cm No Foreign Bodies seen or palpated Anesthesia: local infiltration Local anesthetic: lidocaine 2 % with epinephrine Anesthetic total: 4 ml Irrigation method: syringe Amount of cleaning: standard Skin closure: 5-0 Prolene  Number of sutures or staples: 6  Technique: Running interlocked  Patient tolerance: Patient tolerated the procedure well with no immediate complications.   Labs Reviewed  GLUCOSE, CAPILLARY   Ct Head Wo Contrast  01/29/2012  *RADIOLOGY REPORT*  Clinical Data:  Alcohol intoxication.  Status post fall with abrasions to the forehead and face.  CT HEAD WITHOUT CONTRAST CT CERVICAL SPINE WITHOUT CONTRAST  Technique:  Multidetector CT  imaging of the head and cervical spine was performed following the standard protocol without intravenous contrast.  Multiplanar CT image reconstructions of the cervical spine were also generated.  Comparison:  CT head and cervical spine 08/29/2011.  CT HEAD  Findings: The study is mildly degraded by patient motion.  The patient was scanned several times to include the entire head.  No acute cortical infarct, hemorrhage,  or mass lesion is present. Atrophy and extensive age advanced white matter disease is present, as before.  The ventricles are proportionate to the degree of atrophy.  No significant extra-axial fluid collection is present.  Frontal scalp soft tissue swelling is present.  No acute underlying fracture is present.  The patient is status post right frontal craniotomy.  Wall thickening in the right maxillary sinus is stable, compatible with a history of chronic sinus disease.  No acute sinus disease is present.  The paranasal sinuses and mastoid air cells are clear.  IMPRESSION:  1.  No acute intracranial abnormality or significant interval change. 2.  Stable atrophy and extensive white matter disease. 3.  Soft tissue swelling over the frontal scalp without an underlying fracture.  CT CERVICAL SPINE  Findings: The cervical spine is imaged from the skull base through T2-3.  The vertebral body heights and alignment are maintained. Fusion of anterior osteophytes at C6-7 is stable.  Multilevel degenerative changes are stable.  No acute fracture or traumatic subluxation is evident.  Soft tissues again demonstrate atherosclerotic calcifications at the carotid bifurcations bilaterally.  Emphysematous changes are noted at the lung apices.  IMPRESSION:  1.  No acute fracture or traumatic subluxation.  2.  Stable spondylosis of the cervical spine. 3.  Emphysema. 4.  Atherosclerosis.  Original Report Authenticated By: Jamesetta Orleans. MATTERN, M.D.   Ct Cervical Spine Wo Contrast  01/29/2012  *RADIOLOGY REPORT*  Clinical Data:  Alcohol intoxication.  Status post fall with abrasions to the forehead and face.  CT HEAD WITHOUT CONTRAST CT CERVICAL SPINE WITHOUT CONTRAST  Technique:  Multidetector CT imaging of the head and cervical spine was performed following the standard protocol without intravenous contrast.  Multiplanar CT image reconstructions of the cervical spine were also generated.  Comparison:  CT head and cervical spine  08/29/2011.  CT HEAD  Findings: The study is mildly degraded by patient motion.  The patient was scanned several times to include the entire head.  No acute cortical infarct, hemorrhage, or mass lesion is present. Atrophy and extensive age advanced white matter disease is present, as before.  The ventricles are proportionate to the degree of atrophy.  No significant extra-axial fluid collection is present.  Frontal scalp soft tissue swelling is present.  No acute underlying fracture is present.  The patient is status post right frontal craniotomy.  Wall thickening in the right maxillary sinus is stable, compatible with a history of chronic sinus disease.  No acute sinus disease is present.  The paranasal sinuses and mastoid air cells are clear.  IMPRESSION:  1.  No acute intracranial abnormality or significant interval change. 2.  Stable atrophy and extensive white matter disease. 3.  Soft tissue swelling over the frontal scalp without an underlying fracture.  CT CERVICAL SPINE  Findings: The cervical spine is imaged from the skull base through T2-3.  The vertebral body heights and alignment are maintained. Fusion of anterior osteophytes at C6-7 is stable.  Multilevel degenerative changes are stable.  No acute fracture or traumatic subluxation is evident.  Soft tissues again demonstrate atherosclerotic  calcifications at the carotid bifurcations bilaterally.  Emphysematous changes are noted at the lung apices.  IMPRESSION:  1.  No acute fracture or traumatic subluxation.  2.  Stable spondylosis of the cervical spine. 3.  Emphysema. 4.  Atherosclerosis.  Original Report Authenticated By: Jamesetta Orleans. MATTERN, M.D.    I personally reviewed the imaging tests through PACS system  I reviewed available ER/hospitalization records thought the EMR   1. Laceration of forehead   2. Minor head injury       MDM  Laceration repaired.  CT head and C-spine normal.  Discharge home in good condition.  Infection and  head injury warnings given.  PCP followup.  Suture removal in 5 days.        Lyanne Co, MD 01/29/12 2031   Date: 04/09/2012  Rate: 89  Rhythm: normal sinus rhythm  QRS Axis: normal  Intervals: normal  ST/T Wave abnormalities: normal  Conduction Disutrbances: none  Narrative Interpretation:   Old EKG Reviewed: No significant changes noted     Lyanne Co, MD 04/09/12 2204

## 2012-01-29 NOTE — ED Notes (Signed)
Bandage to forehead changed x 2, pressure dressing applied. Continues to ooze blood.

## 2012-01-29 NOTE — ED Notes (Signed)
Pt presents with no acute distress.  Family present- witnessed fall- Pt intoxicated "tripped on flip flops" Positive LOC.  EMS called out- pt refused transport.  Family to bring pt to ED- c collar applied for cervical support upon arrival- Pt presents with slurred speech, pt able to follow commands GCS 15.  1 inch laceration to left eyebrow, abrasion to rt forehead, swelling to upper lip and abrasions to nose.  Pt does not have any teeth.  Pt denies any complaints

## 2013-12-06 ENCOUNTER — Encounter (HOSPITAL_COMMUNITY): Payer: Self-pay | Admitting: Emergency Medicine

## 2013-12-06 ENCOUNTER — Emergency Department (INDEPENDENT_AMBULATORY_CARE_PROVIDER_SITE_OTHER)
Admission: EM | Admit: 2013-12-06 | Discharge: 2013-12-06 | Disposition: A | Discharge status: Home / Self Care | Payer: Medicare Other | Source: Home / Self Care

## 2013-12-06 DIAGNOSIS — M7989 Other specified soft tissue disorders: Secondary | ICD-10-CM

## 2013-12-06 DIAGNOSIS — R21 Rash and other nonspecific skin eruption: Secondary | ICD-10-CM

## 2013-12-06 HISTORY — DX: Malignant neoplasm of unspecified part of unspecified bronchus or lung: C34.90

## 2013-12-06 HISTORY — DX: Cerebral aneurysm, nonruptured: I67.1

## 2013-12-06 MED ORDER — EUCERIN EX LOTN: TOPICAL_LOTION | CUTANEOUS | Status: DC | PRN

## 2013-12-06 MED ORDER — HYDROCORTISONE 2.5 % EX LOTN
TOPICAL_LOTION | CUTANEOUS | Status: DC
Start: 2013-12-06 — End: 2014-03-16

## 2013-12-06 NOTE — Discharge Instructions (Signed)
Contact Dermatitis Contact dermatitis is a reaction to certain substances that touch the skin. Contact dermatitis can be either irritant contact dermatitis or allergic contact dermatitis. Irritant contact dermatitis does not require previous exposure to the substance for a reaction to occur.Allergic contact dermatitis only occurs if you have been exposed to the substance before. Upon a repeat exposure, your body reacts to the substance.  CAUSES  Many substances can cause contact dermatitis. Irritant dermatitis is most commonly caused by repeated exposure to mildly irritating substances, such as:  Makeup.  Soaps.  Detergents.  Bleaches.  Acids.  Metal salts, such as nickel. Allergic contact dermatitis is most commonly caused by exposure to:  Poisonous plants.  Chemicals (deodorants, shampoos).  Jewelry.  Latex.  Neomycin in triple antibiotic cream.  Preservatives in products, including clothing. SYMPTOMS  The area of skin that is exposed may develop:  Dryness or flaking.  Redness.  Cracks.  Itching.  Pain or a burning sensation.  Blisters. With allergic contact dermatitis, there may also be swelling in areas such as the eyelids, mouth, or genitals.  DIAGNOSIS  Your caregiver can usually tell what the problem is by doing a physical exam. In cases where the cause is uncertain and an allergic contact dermatitis is suspected, a patch skin test may be performed to help determine the cause of your dermatitis. TREATMENT Treatment includes protecting the skin from further contact with the irritating substance by avoiding that substance if possible. Barrier creams, powders, and gloves may be helpful. Your caregiver may also recommend:  Steroid creams or ointments applied 2 times daily. For best results, soak the rash area in cool water for 20 minutes. Then apply the medicine. Cover the area with a plastic wrap. You can store the steroid cream in the refrigerator for a "chilly"  effect on your rash. That may decrease itching. Oral steroid medicines may be needed in more severe cases.  Antibiotics or antibacterial ointments if a skin infection is present.  Antihistamine lotion or an antihistamine taken by mouth to ease itching.  Lubricants to keep moisture in your skin.  Burow's solution to reduce redness and soreness or to dry a weeping rash. Mix one packet or tablet of solution in 2 cups cool water. Dip a clean washcloth in the mixture, wring it out a bit, and put it on the affected area. Leave the cloth in place for 30 minutes. Do this as often as possible throughout the day.  Taking several cornstarch or baking soda baths daily if the area is too large to cover with a washcloth. Harsh chemicals, such as alkalis or acids, can cause skin damage that is like a burn. You should flush your skin for 15 to 20 minutes with cold water after such an exposure. You should also seek immediate medical care after exposure. Bandages (dressings), antibiotics, and pain medicine may be needed for severely irritated skin.  HOME CARE INSTRUCTIONS  Avoid the substance that caused your reaction.  Keep the area of skin that is affected away from hot water, soap, sunlight, chemicals, acidic substances, or anything else that would irritate your skin.  Do not scratch the rash. Scratching may cause the rash to become infected.  You may take cool baths to help stop the itching.  Only take over-the-counter or prescription medicines as directed by your caregiver.  See your caregiver for follow-up care as directed to make sure your skin is healing properly. SEEK MEDICAL CARE IF:   Your condition is not better after 3  days of treatment.  You seem to be getting worse.  You see signs of infection such as swelling, tenderness, redness, soreness, or warmth in the affected area.  You have any problems related to your medicines. Document Released: 06/11/2000 Document Revised: 09/06/2011  Document Reviewed: 11/17/2010 [facility] Patient Information 2014 [place], [place].  Edema Edema is a buildup of fluids. It is most common in the feet, ankles, and legs. This happens more as a person ages. It may affect one or both legs. HOME CARE   Raise (elevate) the legs or ankles above the level of the heart while lying down.  Avoid sitting or standing still for a long time.  Exercise the legs to help the puffiness (swelling) go down.  A low-salt diet may help lessen the puffiness.  Only take medicine as told by your doctor. GET HELP RIGHT AWAY IF:   You develop shortness of breath or chest pain.  You cannot breathe when you lie down.  You have more puffiness that does not go away with treatment.  You develop pain or redness in the areas that are puffy.  You have a temperature by mouth above 102 F (38.9 C), not controlled by medicine.  You gain 03 lb/1.4 kg or more in 1 day or 05 lb/2.3 kg in a week. MAKE SURE YOU:   Understand these instructions.  Will watch your condition.  Will get help right away if you are not doing well or get worse. Document Released: 12/01/2007 Document Revised: 09/06/2011 Document Reviewed: 12/01/2007 [facility] Patient Information 2014 [place], [place].

## 2013-12-06 NOTE — ED Notes (Signed)
Pt  Has  Swelling  Of  Both  /  Feet  And  Ankles   As  Well  As  Rash  On  Both  Feet  And  Hands  -  denys  Any  Chest pain or  Any  Shortness  Of  Breath

## 2013-12-06 NOTE — ED Provider Notes (Signed)
CSN: [id number]     Arrival date & time 12/06/13  1040 History   First MD Initiated Contact with Patient 12/06/13 1124     Chief Complaint  Patient presents with  . Leg Swelling   (Consider location/radiation/quality/duration/timing/severity/associated sxs/prior Treatment) HPI Comments: 73 year old male complaining of bilateral feet swelling for 4 days. He is a poor historian and difficult to obtain a good history. Apparently he developed some swelling and a skin rash to both of his feet 4 days ago. He states that it was mildly itchy with bumps that occurred after he was at a family gathering outside. There is no associated pain. The itching is resolving. The residual signs include mild swelling of the feet but not the ankles or lower leg. The skin is quite dry and scaly on the feet and lesser to the hands. No particular rashes appreciated.   Past Medical History  Diagnosis Date  . ETOH abuse     intoxicated  . Lung cancer   . Brain aneurysm    Past Surgical History  Procedure Laterality Date  . Brain surgery    . Lung surgery     No family history on file. History  Substance Use Topics  . Smoking status: Former Research scientist (life sciences)  . Smokeless tobacco: Not on file  . Alcohol Use: Yes     Comment: "I drink whenever I have money"    Review of Systems  Constitutional: Negative for fever, activity change and fatigue.  HENT: Negative.   Respiratory: Negative for cough, shortness of breath and wheezing.   Cardiovascular: Negative for chest pain and palpitations.  Gastrointestinal: Negative for nausea, vomiting, abdominal pain, diarrhea and constipation.  Neurological: Negative.     Allergies  Review of patient's allergies indicates no known allergies.  Home Medications   Prior to Admission medications   Medication Sig Start Date End Date Taking? Authorizing Provider  Emollient (EUCERIN) lotion Apply topically as needed for dry skin. 12/06/13   Janne Napoleon, NP  hydrocortisone 2.5 % lotion  Apply to feet twice a day 12/06/13   Janne Napoleon, NP   BP 189/103  Pulse 84  Temp(Src) 98.1 F (36.7 C) (Oral)  Resp 16  SpO2 100% Physical Exam  Nursing note and vitals reviewed. Constitutional: He is oriented to person, place, and time. He appears well-developed and well-nourished. No distress.  Eyes: Conjunctivae and EOM are normal.  Neck: Normal range of motion. Neck supple.  Cardiovascular: Normal rate, regular rhythm and normal heart sounds.   Pulmonary/Chest: Effort normal and breath sounds normal. No respiratory distress.  Abdominal: Soft. Bowel sounds are normal.  Musculoskeletal:  Mild  nonpitting edema to both feet. No edema to the ankles or lower legs. Nontender.  Neurological: He is alert and oriented to person, place, and time. He exhibits normal muscle tone.  Skin: Skin is warm and dry.  Dry scaly rash to the bilateral feet and lesser to the hands. No erythema, papules, bleeding, drainage or other lesions.  Psychiatric: He has a normal mood and affect.    ED Course  Procedures (including critical care time) Labs Review Labs Reviewed - No data to display  Imaging Review No results found.   MDM   1. Rash   2. Bilateral swelling of feet    Hydrocortisone cream 2% twice a day to the Eucerin cream to feet and hands 2-3 times a day. Call the doctor on her Medicaid card for an appointment, followup lab work.       Shanon Brow  Chaze Hruska, NP 12/06/13 1151

## 2013-12-12 NOTE — ED Provider Notes (Signed)
Medical screening examination/treatment/procedure(s) were performed by a resident physician or non-physician practitioner and as the supervising physician I was immediately available for consultation/collaboration.  Lynne Leader, MD    Gregor Hams, MD 12/12/13 [phone]

## 2014-03-16 ENCOUNTER — Inpatient Hospital Stay (HOSPITAL_COMMUNITY)
Admission: EM | Admit: 2014-03-16 | Discharge: 2014-03-19 | DRG: 812 | Disposition: A | Discharge status: Home Health | Payer: Medicare HMO | Attending: Internal Medicine | Admitting: Internal Medicine

## 2014-03-16 ENCOUNTER — Encounter (HOSPITAL_COMMUNITY): Payer: Self-pay | Admitting: Emergency Medicine

## 2014-03-16 DIAGNOSIS — R609 Edema, unspecified: Secondary | ICD-10-CM | POA: Diagnosis not present

## 2014-03-16 DIAGNOSIS — J4489 Other specified chronic obstructive pulmonary disease: Secondary | ICD-10-CM

## 2014-03-16 DIAGNOSIS — E8809 Other disorders of plasma-protein metabolism, not elsewhere classified: Secondary | ICD-10-CM | POA: Diagnosis present

## 2014-03-16 DIAGNOSIS — Z79899 Other long term (current) drug therapy: Secondary | ICD-10-CM

## 2014-03-16 DIAGNOSIS — I10 Essential (primary) hypertension: Secondary | ICD-10-CM

## 2014-03-16 DIAGNOSIS — R0609 Other forms of dyspnea: Secondary | ICD-10-CM

## 2014-03-16 DIAGNOSIS — R0989 Other specified symptoms and signs involving the circulatory and respiratory systems: Secondary | ICD-10-CM

## 2014-03-16 DIAGNOSIS — D6489 Other specified anemias: Secondary | ICD-10-CM

## 2014-03-16 DIAGNOSIS — F1020 Alcohol dependence, uncomplicated: Secondary | ICD-10-CM | POA: Diagnosis present

## 2014-03-16 DIAGNOSIS — L989 Disorder of the skin and subcutaneous tissue, unspecified: Secondary | ICD-10-CM

## 2014-03-16 DIAGNOSIS — R195 Other fecal abnormalities: Secondary | ICD-10-CM | POA: Diagnosis present

## 2014-03-16 DIAGNOSIS — Z85118 Personal history of other malignant neoplasm of bronchus and lung: Secondary | ICD-10-CM

## 2014-03-16 DIAGNOSIS — R222 Localized swelling, mass and lump, trunk: Secondary | ICD-10-CM

## 2014-03-16 DIAGNOSIS — L858 Other specified epidermal thickening: Secondary | ICD-10-CM | POA: Diagnosis present

## 2014-03-16 DIAGNOSIS — K921 Melena: Secondary | ICD-10-CM | POA: Diagnosis present

## 2014-03-16 DIAGNOSIS — D500 Iron deficiency anemia secondary to blood loss (chronic): Principal | ICD-10-CM

## 2014-03-16 DIAGNOSIS — J449 Chronic obstructive pulmonary disease, unspecified: Secondary | ICD-10-CM

## 2014-03-16 DIAGNOSIS — Z87891 Personal history of nicotine dependence: Secondary | ICD-10-CM

## 2014-03-16 DIAGNOSIS — D649 Anemia, unspecified: Secondary | ICD-10-CM

## 2014-03-16 DIAGNOSIS — F1010 Alcohol abuse, uncomplicated: Secondary | ICD-10-CM

## 2014-03-16 DIAGNOSIS — K648 Other hemorrhoids: Secondary | ICD-10-CM | POA: Diagnosis present

## 2014-03-16 DIAGNOSIS — L851 Acquired keratosis [keratoderma] palmaris et plantaris: Secondary | ICD-10-CM | POA: Diagnosis present

## 2014-03-16 NOTE — ED Provider Notes (Signed)
CSN: [id number]     Arrival date & time 03/16/14  2141 History   First MD Initiated Contact with Patient 03/16/14 2347     Chief Complaint  Patient presents with  . Leg Swelling     (Consider location/radiation/quality/duration/timing/severity/associated sxs/prior Treatment) HPI This is a 73 year old male who states he has had a painful rash and swelling of his legs for several days. He states he was recently seen at [facility] and given a cream for his legs. Review of the records indicates he was seen at the [facility] on June 11 and prescribed hydrocortisone and Eucerin creams. He states he was seen by his PCP, Iona Beard, yesterday and was prescribed Lotrisone and/or triamcinolone creams.   He states that over the past few days his lower legs have become mottled with hyperpigmentation, have been swollen especially when dependent, and have been painful. The left is more painful on the right. The skin of the feet is fissured and weeping, especially on the soles. Pain is worse with ambulation. He denies systemic symptoms such as fever, chills, chest pain, shortness of breath, nausea, vomiting or diarrhea. He states he's been compliant with his antihypertensives.  Past Medical History  Diagnosis Date  . ETOH abuse     intoxicated  . Lung cancer   . Brain aneurysm    Past Surgical History  Procedure Laterality Date  . Brain surgery    . Lung surgery     History reviewed. No pertinent family history. History  Substance Use Topics  . Smoking status: Former Research scientist (life sciences)  . Smokeless tobacco: Not on file  . Alcohol Use: Yes     Comment: "I drink whenever I have money"    Review of Systems  All other systems reviewed and are negative.   Allergies  Review of patient's allergies indicates no known allergies.  Home Medications   Prior to Admission medications   Medication Sig Start Date End Date Taking? Authorizing Provider  clotrimazole-betamethasone (LOTRISONE) cream  Apply 1 application topically 2 (two) times daily. 03/16/14  Yes Historical Provider, MD  lisinopril (PRINIVIL,ZESTRIL) 10 MG tablet Take 10 mg by mouth daily. 03/05/14  Yes Historical Provider, MD  Multiple Vitamin (MULTIVITAMIN WITH MINERALS) TABS tablet Take 1 tablet by mouth daily.   Yes Historical Provider, MD  naproxen sodium (ANAPROX) 220 MG tablet Take 440 mg by mouth 2 (two) times daily as needed (for pain).   Yes Historical Provider, MD  triamcinolone ointment (KENALOG) 0.1 % Apply 1 application topically 2 (two) times daily. 03/16/14  Yes Historical Provider, MD   BP 180/90  Pulse 87  Temp(Src) 98.1 F (36.7 C) (Oral)  Resp 16  SpO2 99%  Physical Exam General: Well-developed, well-nourished male in no acute distress; appearance consistent with age of record HENT: normocephalic; atraumatic Eyes: pupils equal, round and reactive to light; extraocular muscles intact; arcus senilis bilaterally; right cataract Neck: supple Heart: regular rate and rhythm Lungs: clear to auscultation bilaterally Abdomen: soft; nondistended Extremities: No acute deformity; mottled hyperpigmentation of lower legs with mild edema and thickening of skin of feet, fissuring of skin of soles; DP and PT pulses +2 right, faint but Dopplerable left with capillary refill < 3 secs bilaterally; left ankle BP 179/86, right ankle BP 167/86 Neurologic: Awake, alert and oriented; motor function intact in all extremities and symmetric; no facial droop Skin: Warm and dry; lower legs and feet as shown:  Rectal: Normal sphincter tone; no stool on examining glove brown and heme-negative  Psychiatric: Normal mood and affect    ED Course  Procedures (including critical care time)  MDM   Nursing notes and vitals signs, including pulse oximetry, reviewed.  Summary of this visit's results, reviewed by myself:  Labs:  Results for orders placed during the hospital encounter of 03/16/14 (from the past 24 hour(s))  CBC WITH  DIFFERENTIAL     Status: Abnormal   Collection Time    03/17/14 12:13 AM      Result Value Ref Range   WBC 7.5  4.0 - 10.5 K/uL   RBC 1.97 (*) 4.22 - 5.81 MIL/uL   Hemoglobin 6.6 (*) 13.0 - 17.0 g/dL   HCT 18.6 (*) 39.0 - 52.0 %   MCV 94.4  78.0 - 100.0 fL   MCH 33.5  26.0 - 34.0 pg   MCHC 35.5  30.0 - 36.0 g/dL   RDW 16.6 (*) 11.5 - 15.5 %   Platelets 441 (*) 150 - 400 K/uL   Neutrophils Relative % 40 (*) 43 - 77 %   Neutro Abs 2.9  1.7 - 7.7 K/uL   Lymphocytes Relative 28  12 - 46 %   Lymphs Abs 2.1  0.7 - 4.0 K/uL   Monocytes Relative 27 (*) 3 - 12 %   Monocytes Absolute 2.0 (*) 0.1 - 1.0 K/uL   Eosinophils Relative 5  0 - 5 %   Eosinophils Absolute 0.4  0.0 - 0.7 K/uL   Basophils Relative 1  0 - 1 %   Basophils Absolute 0.0  0.0 - 0.1 K/uL  COMPREHENSIVE METABOLIC PANEL     Status: Abnormal   Collection Time    03/17/14 12:13 AM      Result Value Ref Range   Sodium 140  137 - 147 mEq/L   Potassium 4.4  3.7 - 5.3 mEq/L   Chloride 104  96 - 112 mEq/L   CO2 19  19 - 32 mEq/L   Glucose, Bld 75  70 - 99 mg/dL   BUN 19  6 - 23 mg/dL   Creatinine, Ser 0.90  0.50 - 1.35 mg/dL   Calcium 9.2  8.4 - 10.5 mg/dL   Total Protein 8.5 (*) 6.0 - 8.3 g/dL   Albumin 3.1 (*) 3.5 - 5.2 g/dL   AST 37  0 - 37 U/L   ALT 19  0 - 53 U/L   Alkaline Phosphatase 89  39 - 117 U/L   Total Bilirubin 0.2 (*) 0.3 - 1.2 mg/dL   GFR calc non Af Amer 82 (*) >90 mL/min   GFR calc Af Amer >90  >90 mL/min   Anion gap 17 (*) 5 - 15  ETHANOL     Status: Abnormal   Collection Time    03/17/14 12:13 AM      Result Value Ref Range   Alcohol, Ethyl (B) 82 (*) 0 - 11 mg/dL  TYPE AND SCREEN     Status: None   Collection Time    03/17/14  1:16 AM      Result Value Ref Range   ABO/RH(D) A POS     Antibody Screen PENDING     Sample Expiration 03/20/2014         Karen Chafe Alyanah Elliott, MD 03/17/14 [phone]

## 2014-03-16 NOTE — ED Notes (Addendum)
Pt presents via [facility] d/t b/l leg swelling.  Pt saw his PCP 9/18 and was given cream to apply however this did not help.  Skin on pt's feet are cracking d/t the severe degree of edema present.  ETOH on board as pt told [facility] he drank approximately 1/3 pint of liquor.  Pt is unable to ambulate d/t swelling, reports pain in left not right unless he stands in which case pain is present in b/l legs.

## 2014-03-16 NOTE — ED Notes (Signed)
MD at bedside. 

## 2014-03-17 DIAGNOSIS — D649 Anemia, unspecified: Secondary | ICD-10-CM | POA: Diagnosis present

## 2014-03-17 DIAGNOSIS — E8809 Other disorders of plasma-protein metabolism, not elsewhere classified: Secondary | ICD-10-CM | POA: Diagnosis present

## 2014-03-17 DIAGNOSIS — F1010 Alcohol abuse, uncomplicated: Secondary | ICD-10-CM

## 2014-03-17 DIAGNOSIS — R195 Other fecal abnormalities: Secondary | ICD-10-CM

## 2014-03-17 DIAGNOSIS — Z87891 Personal history of nicotine dependence: Secondary | ICD-10-CM | POA: Diagnosis not present

## 2014-03-17 DIAGNOSIS — Z79899 Other long term (current) drug therapy: Secondary | ICD-10-CM | POA: Diagnosis not present

## 2014-03-17 DIAGNOSIS — F1020 Alcohol dependence, uncomplicated: Secondary | ICD-10-CM | POA: Diagnosis present

## 2014-03-17 DIAGNOSIS — J449 Chronic obstructive pulmonary disease, unspecified: Secondary | ICD-10-CM

## 2014-03-17 DIAGNOSIS — K648 Other hemorrhoids: Secondary | ICD-10-CM | POA: Diagnosis present

## 2014-03-17 DIAGNOSIS — I10 Essential (primary) hypertension: Secondary | ICD-10-CM

## 2014-03-17 DIAGNOSIS — I517 Cardiomegaly: Secondary | ICD-10-CM

## 2014-03-17 DIAGNOSIS — M79609 Pain in unspecified limb: Secondary | ICD-10-CM

## 2014-03-17 DIAGNOSIS — R609 Edema, unspecified: Secondary | ICD-10-CM | POA: Diagnosis present

## 2014-03-17 DIAGNOSIS — L988 Other specified disorders of the skin and subcutaneous tissue: Secondary | ICD-10-CM

## 2014-03-17 DIAGNOSIS — D500 Iron deficiency anemia secondary to blood loss (chronic): Principal | ICD-10-CM

## 2014-03-17 DIAGNOSIS — Z85118 Personal history of other malignant neoplasm of bronchus and lung: Secondary | ICD-10-CM | POA: Diagnosis not present

## 2014-03-17 DIAGNOSIS — L858 Other specified epidermal thickening: Secondary | ICD-10-CM | POA: Diagnosis present

## 2014-03-17 DIAGNOSIS — L989 Disorder of the skin and subcutaneous tissue, unspecified: Secondary | ICD-10-CM | POA: Diagnosis present

## 2014-03-17 DIAGNOSIS — K921 Melena: Secondary | ICD-10-CM | POA: Diagnosis present

## 2014-03-17 DIAGNOSIS — D6489 Other specified anemias: Secondary | ICD-10-CM

## 2014-03-17 DIAGNOSIS — L851 Acquired keratosis [keratoderma] palmaris et plantaris: Secondary | ICD-10-CM | POA: Diagnosis present

## 2014-03-17 LAB — URINALYSIS, ROUTINE W REFLEX MICROSCOPIC
Bilirubin Urine: NEGATIVE
GLUCOSE, UA: NEGATIVE mg/dL
Hgb urine dipstick: NEGATIVE
KETONES UR: 15 mg/dL — AB
Leukocytes, UA: NEGATIVE
Nitrite: NEGATIVE
Protein, ur: 100 mg/dL — AB
SPECIFIC GRAVITY, URINE: 1.014 (ref 1.005–1.030)
Urobilinogen, UA: 0.2 mg/dL (ref 0.0–1.0)
pH: 5.5 (ref 5.0–8.0)

## 2014-03-17 LAB — IRON AND TIBC
Iron: 199 ug/dL — ABNORMAL HIGH (ref 42–135)
UIBC: <15 ug/dL — ABNORMAL LOW (ref 125–400)

## 2014-03-17 LAB — BASIC METABOLIC PANEL
Anion gap: 13 (ref 5–15)
BUN: 20 mg/dL (ref 6–23)
CO2: 20 mEq/L (ref 19–32)
CREATININE: 0.86 mg/dL (ref 0.50–1.35)
Calcium: 8.6 mg/dL (ref 8.4–10.5)
Chloride: 106 mEq/L (ref 96–112)
GFR calc non Af Amer: 84 mL/min — ABNORMAL LOW (ref >90)
Glucose, Bld: 104 mg/dL — ABNORMAL HIGH (ref 70–99)
Potassium: 4.6 mEq/L (ref 3.7–5.3)
Sodium: 139 mEq/L (ref 137–147)

## 2014-03-17 LAB — CBC WITH DIFFERENTIAL/PLATELET
BASOS PCT: 1 % (ref 0–1)
Basophils Absolute: 0 10*3/uL (ref 0.0–0.1)
EOS ABS: 0.4 10*3/uL (ref 0.0–0.7)
Eosinophils Relative: 5 % (ref 0–5)
HEMATOCRIT: 18.6 % — AB (ref 39.0–52.0)
HEMOGLOBIN: 6.6 g/dL — AB (ref 13.0–17.0)
LYMPHS ABS: 2.1 10*3/uL (ref 0.7–4.0)
Lymphocytes Relative: 28 % (ref 12–46)
MCH: 33.5 pg (ref 26.0–34.0)
MCHC: 35.5 g/dL (ref 30.0–36.0)
MCV: 94.4 fL (ref 78.0–100.0)
MONO ABS: 2 10*3/uL — AB (ref 0.1–1.0)
MONOS PCT: 27 % — AB (ref 3–12)
NEUTROS ABS: 2.9 10*3/uL (ref 1.7–7.7)
Neutrophils Relative %: 40 % — ABNORMAL LOW (ref 43–77)
Platelets: 441 10*3/uL — ABNORMAL HIGH (ref 150–400)
RBC: 1.97 MIL/uL — AB (ref 4.22–5.81)
RDW: 16.6 % — ABNORMAL HIGH (ref 11.5–15.5)
WBC: 7.5 10*3/uL (ref 4.0–10.5)

## 2014-03-17 LAB — CBC
HCT: 38.8 % — ABNORMAL LOW (ref 39.0–52.0)
Hemoglobin: 13.5 g/dL (ref 13.0–17.0)
MCH: 31.8 pg (ref 26.0–34.0)
MCHC: 34.8 g/dL (ref 30.0–36.0)
MCV: 91.3 fL (ref 78.0–100.0)
Platelets: 265 10*3/uL (ref 150–400)
RBC: 4.25 MIL/uL (ref 4.22–5.81)
RDW: 18.3 % — ABNORMAL HIGH (ref 11.5–15.5)
WBC: 5.7 10*3/uL (ref 4.0–10.5)

## 2014-03-17 LAB — PREPARE RBC (CROSSMATCH)

## 2014-03-17 LAB — URINE MICROSCOPIC-ADD ON

## 2014-03-17 LAB — FOLATE: Folate: 12.5 ng/mL

## 2014-03-17 LAB — FERRITIN: Ferritin: 95 ng/mL (ref 22–322)

## 2014-03-17 LAB — ABO/RH: ABO/RH(D): A POS

## 2014-03-17 LAB — RETICULOCYTES
RBC.: 4.21 MIL/uL — AB (ref 4.22–5.81)
RETIC CT PCT: 1.9 % (ref 0.4–3.1)
Retic Count, Absolute: 80 10*3/uL (ref 19.0–186.0)

## 2014-03-17 LAB — COMPREHENSIVE METABOLIC PANEL
ALBUMIN: 3.1 g/dL — AB (ref 3.5–5.2)
ALK PHOS: 89 U/L (ref 39–117)
ALT: 19 U/L (ref 0–53)
ANION GAP: 17 — AB (ref 5–15)
AST: 37 U/L (ref 0–37)
BILIRUBIN TOTAL: 0.2 mg/dL — AB (ref 0.3–1.2)
BUN: 19 mg/dL (ref 6–23)
CHLORIDE: 104 meq/L (ref 96–112)
CO2: 19 mEq/L (ref 19–32)
CREATININE: 0.9 mg/dL (ref 0.50–1.35)
Calcium: 9.2 mg/dL (ref 8.4–10.5)
GFR calc non Af Amer: 82 mL/min — ABNORMAL LOW (ref >90)
GLUCOSE: 75 mg/dL (ref 70–99)
POTASSIUM: 4.4 meq/L (ref 3.7–5.3)
Sodium: 140 mEq/L (ref 137–147)
Total Protein: 8.5 g/dL — ABNORMAL HIGH (ref 6.0–8.3)

## 2014-03-17 LAB — PROTIME-INR
INR: 1.04 (ref 0.00–1.49)
PROTHROMBIN TIME: 13.6 s (ref 11.6–15.2)

## 2014-03-17 LAB — RAPID URINE DRUG SCREEN, HOSP PERFORMED
AMPHETAMINES: NOT DETECTED
BENZODIAZEPINES: NOT DETECTED
Barbiturates: NOT DETECTED
COCAINE: NOT DETECTED
Opiates: NOT DETECTED
Tetrahydrocannabinol: NOT DETECTED

## 2014-03-17 LAB — VITAMIN B12: VITAMIN B 12: 450 pg/mL (ref 211–911)

## 2014-03-17 LAB — ETHANOL: ALCOHOL ETHYL (B): 82 mg/dL — AB (ref 0–11)

## 2014-03-17 LAB — PROTEIN / CREATININE RATIO, URINE
CREATININE, URINE: 59.17 mg/dL
Protein Creatinine Ratio: 1.14 — ABNORMAL HIGH (ref 0.00–0.15)
Total Protein, Urine: 67.7 mg/dL

## 2014-03-17 MED ORDER — PANTOPRAZOLE SODIUM 40 MG PO TBEC
40.0000 mg | DELAYED_RELEASE_TABLET | Freq: Every day | ORAL | Status: DC
  Administered 2014-03-17 – 2014-03-19 (×3): 40 mg via ORAL
  Filled 2014-03-17 (×3): qty 1

## 2014-03-17 MED ORDER — SODIUM CHLORIDE 0.9 % IJ SOLN: 3.0000 mL | Freq: Two times a day (BID) | INTRAMUSCULAR | Status: DC

## 2014-03-17 MED ORDER — HYDROMORPHONE HCL 1 MG/ML IJ SOLN: 0.5000 mg | INTRAMUSCULAR | Status: DC | PRN

## 2014-03-17 MED ORDER — PEG-KCL-NACL-NASULF-NA ASC-C 100 G PO SOLR: 0.5000 | Freq: Once | ORAL | Status: DC

## 2014-03-17 MED ORDER — ONDANSETRON HCL 4 MG/2ML IJ SOLN: 4.0000 mg | Freq: Four times a day (QID) | INTRAMUSCULAR | Status: DC | PRN

## 2014-03-17 MED ORDER — ALUM & MAG HYDROXIDE-SIMETH 200-200-20 MG/5ML PO SUSP: 30.0000 mL | Freq: Four times a day (QID) | ORAL | Status: DC | PRN

## 2014-03-17 MED ORDER — PNEUMOCOCCAL VAC POLYVALENT 25 MCG/0.5ML IJ INJ
0.5000 mL | INJECTION | INTRAMUSCULAR | Status: AC
  Administered 2014-03-18: 0.5 mL via INTRAMUSCULAR
  Filled 2014-03-17 (×2): qty 0.5

## 2014-03-17 MED ORDER — ACETAMINOPHEN 325 MG PO TABS: 650.0000 mg | ORAL_TABLET | Freq: Four times a day (QID) | ORAL | Status: DC | PRN

## 2014-03-17 MED ORDER — ENOXAPARIN SODIUM 30 MG/0.3ML ~~LOC~~ SOLN
30.0000 mg | SUBCUTANEOUS | Status: DC
  Filled 2014-03-17: qty 0.3

## 2014-03-17 MED ORDER — SODIUM CHLORIDE 0.9 % IJ SOLN
3.0000 mL | Freq: Two times a day (BID) | INTRAMUSCULAR | Status: DC
  Administered 2014-03-17 (×2): 3 mL via INTRAVENOUS

## 2014-03-17 MED ORDER — SODIUM CHLORIDE 0.9 % IV SOLN
INTRAVENOUS | Status: DC
  Administered 2014-03-18 (×2): via INTRAVENOUS

## 2014-03-17 MED ORDER — PEG-KCL-NACL-NASULF-NA ASC-C 100 G PO SOLR
0.5000 | Freq: Once | ORAL | Status: AC
  Administered 2014-03-17: 100 g via ORAL

## 2014-03-17 MED ORDER — PEG-KCL-NACL-NASULF-NA ASC-C 100 G PO SOLR
0.5000 | Freq: Once | ORAL | Status: AC
  Administered 2014-03-17: 100 g via ORAL
  Filled 2014-03-17: qty 1

## 2014-03-17 MED ORDER — INFLUENZA VAC SPLIT QUAD 0.5 ML IM SUSY
0.5000 mL | PREFILLED_SYRINGE | INTRAMUSCULAR | Status: AC
  Administered 2014-03-18: 0.5 mL via INTRAMUSCULAR
  Filled 2014-03-17 (×2): qty 0.5

## 2014-03-17 MED ORDER — HYDRALAZINE HCL 20 MG/ML IJ SOLN
10.0000 mg | Freq: Four times a day (QID) | INTRAMUSCULAR | Status: DC | PRN
  Administered 2014-03-17 – 2014-03-18 (×2): 10 mg via INTRAVENOUS
  Filled 2014-03-17 (×2): qty 1

## 2014-03-17 MED ORDER — OXYCODONE HCL 5 MG PO TABS: 5.0000 mg | ORAL_TABLET | ORAL | Status: DC | PRN

## 2014-03-17 MED ORDER — SODIUM CHLORIDE 0.9 % IV SOLN: Freq: Once | INTRAVENOUS | Status: DC

## 2014-03-17 MED ORDER — ONDANSETRON HCL 4 MG PO TABS: 4.0000 mg | ORAL_TABLET | Freq: Four times a day (QID) | ORAL | Status: DC | PRN

## 2014-03-17 MED ORDER — SODIUM CHLORIDE 0.9 % IJ SOLN: 3.0000 mL | INTRAMUSCULAR | Status: DC | PRN

## 2014-03-17 MED ORDER — LISINOPRIL 10 MG PO TABS
10.0000 mg | ORAL_TABLET | Freq: Every day | ORAL | Status: DC
  Administered 2014-03-17 – 2014-03-18 (×2): 10 mg via ORAL
  Filled 2014-03-17 (×2): qty 1

## 2014-03-17 MED ORDER — PEG-KCL-NACL-NASULF-NA ASC-C 100 G PO SOLR: 1.0000 | Freq: Once | ORAL | Status: DC

## 2014-03-17 MED ORDER — ACETAMINOPHEN 650 MG RE SUPP: 650.0000 mg | Freq: Four times a day (QID) | RECTAL | Status: DC | PRN

## 2014-03-17 MED ORDER — PEG-KCL-NACL-NASULF-NA ASC-C 100 G PO SOLR
0.5000 | Freq: Once | ORAL | Status: DC
  Filled 2014-03-17: qty 1

## 2014-03-17 MED ORDER — SODIUM CHLORIDE 0.9 % IV SOLN
250.0000 mL | INTRAVENOUS | Status: DC | PRN
  Administered 2014-03-17: 250 mL via INTRAVENOUS

## 2014-03-17 NOTE — Consult Note (Signed)
Referring Provider: No ref. provider found Primary Care Physician:  No primary provider on file. Primary Gastroenterologist:  None/unassigned  Reason for Consultation: severe normocytic anemia/heme positive stool  HPI: Gary Stevenson is a 73 y.o.A.A .male admitted last night after coming to ER with c/o swollen legs x one week.he was incidentally found to be anemic with HGb of 6.6,normocytic, and heme positive. Pt has hx of ETOH abuse, hx of squamous cell lung Ca -s/p RUL lobectomy 2010,COPD. He denies any hx of anemia , and last labs in our computer are from 2010 around time of lung surgery-hgb 10 range. He says he has lost weight recently, not hungry. No abdominal pain, no nausea or vomiting. Denies dysphagia. No abdominal pain, Bm's normal and has not noted any melena or heme Family hx negative as far as he is aware. He does take Aleve-2 daily generally. Does not drink every day, but most days.  No abdominal  imaging since admit Echo pending Has received 2 units Fe studies pending   Past Medical History  Diagnosis Date  . ETOH abuse     intoxicated  . Lung cancer   . Brain aneurysm     Past Surgical History  Procedure Laterality Date  . Brain surgery    . Lung surgery      Prior to Admission medications   Medication Sig Start Date End Date Taking? Authorizing Provider  clotrimazole-betamethasone (LOTRISONE) cream Apply 1 application topically 2 (two) times daily. 03/16/14  Yes Historical Provider, MD  lisinopril (PRINIVIL,ZESTRIL) 10 MG tablet Take 10 mg by mouth daily. 03/05/14  Yes Historical Provider, MD  Multiple Vitamin (MULTIVITAMIN WITH MINERALS) TABS tablet Take 1 tablet by mouth daily.   Yes Historical Provider, MD  naproxen sodium (ANAPROX) 220 MG tablet Take 440 mg by mouth 2 (two) times daily as needed (for pain).   Yes Historical Provider, MD  triamcinolone ointment (KENALOG) 0.1 % Apply 1 application topically 2 (two) times daily. 03/16/14  Yes Historical  Provider, MD    Current Facility-Administered Medications  Medication Dose Route Frequency Provider Last Rate Last Dose  . 0.9 %  sodium chloride infusion  250 mL Intravenous PRN Theressa Millard, MD 10 mL/hr at 03/17/14 0356 250 mL at 03/17/14 0356  . 0.9 %  sodium chloride infusion   Intravenous Once Theressa Millard, MD      . acetaminophen (TYLENOL) tablet 650 mg  650 mg Oral Q6H PRN Theressa Millard, MD       Or  . acetaminophen (TYLENOL) suppository 650 mg  650 mg Rectal Q6H PRN Theressa Millard, MD      . alum & mag hydroxide-simeth (MAALOX/MYLANTA) 200-200-20 MG/5ML suspension 30 mL  30 mL Oral Q6H PRN Theressa Millard, MD      . hydrALAZINE (APRESOLINE) injection 10 mg  10 mg Intravenous Q6H PRN Theressa Millard, MD   10 mg at 03/17/14 0752  . HYDROmorphone (DILAUDID) injection 0.5-1 mg  0.5-1 mg Intravenous Q3H PRN Theressa Millard, MD      . Derrill Memo ON 03/18/2014] Influenza vac split quadrivalent PF (FLUARIX) injection 0.5 mL  0.5 mL Intramuscular Tomorrow-1000 Harvette C Jenkins, MD      . lisinopril (PRINIVIL,ZESTRIL) tablet 10 mg  10 mg Oral Daily Theressa Millard, MD   10 mg at 03/17/14 0931  . ondansetron (ZOFRAN) tablet 4 mg  4 mg Oral Q6H PRN Theressa Millard, MD       Or  .  ondansetron (ZOFRAN) injection 4 mg  4 mg Intravenous Q6H PRN Theressa Millard, MD      . oxyCODONE (Oxy IR/ROXICODONE) immediate release tablet 5 mg  5 mg Oral Q4H PRN Theressa Millard, MD      . pantoprazole (PROTONIX) EC tablet 40 mg  40 mg Oral Daily Theressa Millard, MD   40 mg at 03/17/14 0606  . [START ON 03/18/2014] pneumococcal 23 valent vaccine (PNU-IMMUNE) injection 0.5 mL  0.5 mL Intramuscular Tomorrow-1000 Harvette C Jenkins, MD      . sodium chloride 0.9 % injection 3 mL  3 mL Intravenous Q12H Harvette C Jenkins, MD      . sodium chloride 0.9 % injection 3 mL  3 mL Intravenous Q12H Theressa Millard, MD   3 mL at 03/17/14 0341  . sodium chloride 0.9 % injection 3 mL  3  mL Intravenous PRN Theressa Millard, MD        Allergies as of 03/16/2014  . (No Known Allergies)    History reviewed. No pertinent family history.  History   Social History  . Marital Status: Single    Spouse Name: N/A    Number of Children: N/A  . Years of Education: N/A   Occupational History  . Not on file.   Social History Main Topics  . Smoking status: Former Research scientist (life sciences)  . Smokeless tobacco: Not on file  . Alcohol Use: Yes     Comment: "I drink whenever I have money"  . Drug Use: No   Review of Systems: Pertinent positive and negative review of systems were noted in the above HPI section.  All other review of systems was otherwise negative.  Physical Exam: Vital signs in last 24 hours: Temp:  [98.1 F (36.7 C)-98.5 F (36.9 C)] 98.5 F (36.9 C) (09/20 1025) Pulse Rate:  [69-90] 82 (09/20 1025) Resp:  [14-16] 16 (09/20 1025) BP: (132-180)/(71-97) 135/87 mmHg (09/20 1025) SpO2:  [97 %-100 %] 100 % (09/20 1025) Weight:  [125 lb 14.1 oz (57.1 kg)] 125 lb 14.1 oz (57.1 kg) (09/20 0250) Last BM Date: 03/16/14 General:   Alert,  Well-developed, thin elderly AA male , pleasant and cooperative in NAD,sleepy Head:  Normocephalic and atraumatic. Eyes:  Sclera clear, no icterus.   Conjunctiva pale Ears:  Normal auditory acuity. Nose:  No deformity, discharge,  or lesions. Mouth:  No deformity or lesions.   Neck:  Supple; no masses or thyromegaly. Lungs:  Clear throughout to auscultation.   No wheezes, crackles, or rhonchi. Heart:  Regular rate and rhythm; no murmurs, clicks, rubs,  or gallops. Abdomen:  Soft,nontender, BS active,nonpalp mass or hsm.   Rectal:  Deferred - documented brown ,heme positive Msk:  Symmetrical without gross deformities. . Pulses:  Normal pulses noted. Extremities: legs warm ,erythema, 1+edema bilat Neurologic:  Alert and  oriented x4;  grossly normal neurologically. Skin:  Intact without significant lesions or rashes.. Psych:  Alert and  cooperative. Normal mood and affect.   Lab Results:  Recent Labs  03/17/14 0013  WBC 7.5  HGB 6.6*  HCT 18.6*  PLT 441*   BMET  Recent Labs  03/17/14 0013  NA 140  K 4.4  CL 104  CO2 19  GLUCOSE 75  BUN 19  CREATININE 0.90  CALCIUM 9.2   LFT  Recent Labs  03/17/14 0013  PROT 8.5*  ALBUMIN 3.1*  AST 37  ALT 19  ALKPHOS 89  BILITOT 0.2*    IMPRESSION:  #  1  73 yo male with profound normocytic  Anemia, heme positive stool- r/o upper vs lower GI source. + NSAID use Pt with recent weight loss, decreased appetite-r/o malignancy #2 LE edema- workup in progress- no evidence for cirrhosis by labs, though should consider #3 ETOH abuse #4 hx squamous cell lung CA 2010 #5 COPD   PLAN: Discussed GI workup with colon and EGD with pt and he is agreeable Will prep today for procedures tomorrow with Dr. Collene Mares Clear liquids Follow up pending studies   [facility]  03/17/2014, 10:46 AM   [facility] Attending  I have also seen and assessed the patient and agree with the above note. Needs endoscopic evaluation for his anemia and heme + stool - has never had EGD or colonoscopy The risks and benefits as well as alternatives of endoscopic procedure(s) have been discussed and reviewed. All questions answered. The patient agrees to proceed.  Lab Results  Component Value Date   INR 1.04 03/17/2014   INR 1.0 02/05/2009   Lab Results  Component Value Date   WBC 5.7 03/17/2014   HGB 13.5 03/17/2014   HCT 38.8* 03/17/2014   MCV 91.3 03/17/2014   PLT 265 03/17/2014    Huge jump in Hgb with transfusion - ? Spurious  Gatha Mayer, MD, [facility] [phone] (pager) 03/17/2014 2:38 PM

## 2014-03-17 NOTE — H&P (Signed)
[facility] Admission History and Physical       Gary Stevenson XWR:[id number] DOB: 1940/10/28 DOA: 03/16/2014  Referring physician: EDP PCP: No primary provider on file.  Specialists:   Chief Complaint: Swollen Legs  HPI: Gary Stevenson is a 73 y.o. male with a history of Alcohol Abuse, HTN, Lung Cancer who presents to the ED with complaints of worsening edema and pain and cracking of the skin of his lower  Legs adn feet x 1 week.  He was convinced to go to the ED by his friends.   He was evaluated in the ED and was found to incidentally have a hemoglobin level of 6.6, and the MCV was 94.4.  He denies havign any hematemesisi, hematochezia and melena.  He also denies having any SOB,DOE and Fatigue.   He was referred for medical admission.      Review of Systems:  Constitutional: No Weight Loss, No Weight Gain, Night Sweats, Fevers, Chills, Dizziness, Fatigue, or Generalized Weakness HEENT: No Headaches, Difficulty Swallowing,Tooth/Dental Problems,Sore Throat,  No Sneezing, Rhinitis, Ear Ache, Nasal Congestion, or Post Nasal Drip,  Cardio-vascular:  No Chest pain, Orthopnea, PND, Edema in Lower Extremities, Anasarca, Dizziness, Palpitations  Resp: No Dyspnea, No DOE, No Productive Cough, No Non-Productive Cough, No Hemoptysis, No Wheezing.    GI: No Heartburn, Indigestion, Abdominal Pain, Nausea, Vomiting, Diarrhea, Hematemesis, Hematochezia, Melena, Change in Bowel Habits,  Loss of Appetite  GU: No Dysuria, Change in Color of Urine, No Urgency or Frequency, No Flank pain.  Musculoskeletal: No Joint Pain or Swelling, No Decreased Range of Motion, No Back Pain.  Neurologic: No Syncope, No Seizures, Muscle Weakness, Paresthesia, Vision Disturbance or Loss, No Diplopia, No Vertigo, No Difficulty Walking,  Skin: +Rash and Lesions on BLEs and Feet. Psych: No Change in Mood or Affect, No Depression or Anxiety, No Memory loss, No Confusion, or Hallucinations   Past Medical History    Diagnosis Date  . ETOH abuse     intoxicated  . Lung cancer   . Brain aneurysm     Past Surgical History  Procedure Laterality Date  . Brain surgery    . Lung surgery       Prior to Admission medications   Medication Sig Start Date End Date Taking? Authorizing Provider  clotrimazole-betamethasone (LOTRISONE) cream Apply 1 application topically 2 (two) times daily. 03/16/14  Yes Historical Provider, MD  lisinopril (PRINIVIL,ZESTRIL) 10 MG tablet Take 10 mg by mouth daily. 03/05/14  Yes Historical Provider, MD  Multiple Vitamin (MULTIVITAMIN WITH MINERALS) TABS tablet Take 1 tablet by mouth daily.   Yes Historical Provider, MD  naproxen sodium (ANAPROX) 220 MG tablet Take 440 mg by mouth 2 (two) times daily as needed (for pain).   Yes Historical Provider, MD  triamcinolone ointment (KENALOG) 0.1 % Apply 1 application topically 2 (two) times daily. 03/16/14  Yes Historical Provider, MD    No Known Allergies   Social History:  reports that he has quit smoking. He does not have any smokeless tobacco history on file. He reports that he drinks alcohol. He reports that he does not use illicit drugs.     History reviewed. No pertinent family history.     Physical Exam:  GEN:  Pleasant Elderly Thin 73 y.o. African American male examined and in no acute distress; cooperative with exam Filed Vitals:   03/17/14 0100 03/17/14 0130 03/17/14 0200 03/17/14 0250  BP: 147/88 150/78 150/71 163/85  Pulse: 77 69 80 82  Temp:  98.3 F (36.8 C)  TempSrc:    Oral  Resp:   14 16  Height:    5\' 6"  (1.676 m)  Weight:    57.1 kg (125 lb 14.1 oz)  SpO2: 100% 99% 100% 100%   Blood pressure 163/85, pulse 82, temperature 98.3 F (36.8 C), temperature source Oral, resp. rate 16, height 5\' 6"  (1.676 m), weight 57.1 kg (125 lb 14.1 oz), SpO2 100.00%. PSYCH: He is alert and oriented x4; does not appear anxious does not appear depressed; affect is normal HEENT: Normocephalic and Atraumatic, Mucous  membranes pink; PERRLA; EOM intact; Fundi:  Benign;  No scleral icterus, Nares: Patent, Oropharynx: Clear, Fair Dentition,    Neck:  FROM, No Cervical Lymphadenopathy nor Thyromegaly or Carotid Bruit; No JVD; Breasts:: Not examined CHEST WALL: No tenderness CHEST: Normal respiration, clear to auscultation bilaterally HEART: Regular rate and rhythm; no murmurs rubs or gallops BACK: No kyphosis or scoliosis; No CVA tenderness ABDOMEN: Positive Bowel Sounds, Scaphoid, Soft Non-Tender; No Masses, No Organomegaly Rectal Exam: Not done EXTREMITIES: No Cyanosis, Clubbing,  2+ Edema to BLEs;+ Stasis Dermatitis changes on BLEs Genitalia: not examined PULSES: 2+ and symmetric SKIN: Normal hydration,   +Dry Cracked skin on Both Feet CNS: Alert and oriented x 4, No Focal Deficits  Vascular: pulses palpable throughout    Labs on Admission:  Basic Metabolic Panel:  Recent Labs Lab 03/17/14 0013  NA 140  K 4.4  CL 104  CO2 19  GLUCOSE 75  BUN 19  CREATININE 0.90  CALCIUM 9.2   Liver Function Tests:  Recent Labs Lab 03/17/14 0013  AST 37  ALT 19  ALKPHOS 89  BILITOT 0.2*  PROT 8.5*  ALBUMIN 3.1*   No results found for this basename: LIPASE, AMYLASE,  in the last 168 hours No results found for this basename: AMMONIA,  in the last 168 hours CBC:  Recent Labs Lab 03/17/14 0013  WBC 7.5  NEUTROABS 2.9  HGB 6.6*  HCT 18.6*  MCV 94.4  PLT 441*   Cardiac Enzymes: No results found for this basename: CKTOTAL, CKMB, CKMBINDEX, TROPONINI,  in the last 168 hours  BNP (last 3 results) No results found for this basename: PROBNP,  in the last 8760 hours CBG: No results found for this basename: GLUCAP,  in the last 168 hours  Radiological Exams on Admission: No results found.  .    Assessment/Plan:   73 y.o. male with   Principal Problem:   1.   Anemia- with Macrocytic Indices     Transfuse 2 units PRBC   Monitor H/Hs   Check FOBT q AM x 3   Anemia Panel    2.    Alcohol abuse   CIWA protocol   B12, Folate  And Thiamine Supplements     3.   Inflammatory hyperkeratotic dermatosis   Skin Care Evlauation     4.   Essential hypertension   Continue Lisinopril Rx   Monitor BPs   PRN IV Hydralazine     5.   DVT Prophylaxis    Lovenox      Code Status:   FULL CODE Family Communication:  No Family Present   Disposition Plan:    Inpatient    Time spent:  [place] [facility] Pager [phone]   If [place] Please Contact the Day Rounding Team MD for [facility]  If 7PM-7AM, Please Contact night-coverage  [email or web address] Password [email or web address] 03/17/2014, 3:33 AM

## 2014-03-17 NOTE — Progress Notes (Addendum)
PROGRESS NOTE  Gary Stevenson:[id number] DOB: [birth year] DOA: 03/16/2014 PCP: No primary provider on file.  Interim summary 73 year old male with a history of alcohol dependence, hypertension, lung cancer presented with one week history of worsening lower extremity edema and pain. Upon workup in the emergency department, he was noted to have hemoglobin of 6.6. The patient endorsed drinking 1 pint of liquor 2-3 times per week for many years. He has 50-pack-year history of tobacco use. The patient denied any hematochezia, melena, hematemesis. He endorsed use of Aleve frequently. The patient was transfused 2 units PRBCs. Gastroenterology was consulted and plans EGD and colonoscopy.  Assessment/Plan: Chronic blood loss anemia -Suspect upper GI etiology -Patient uses NSAIDs as well as having alcohol dependence -FOBT is positive with brown stool -Consult GI -Await anemia panel -Discontinue Lovenox-place SCDs -PPI Leg edema -Urinalysis -Venous duplex -Urine protein creatinine ratio -May be partly due to the patient's hypoalbuminemia -Echocardiogram -TSH Alcohol dependence -Alcohol withdrawal protocol -UDS Hypertension -Continue lisinopril History of squamous cell carcinoma of the right lung -s/p RML lobectomy in August 2010 Tobacco abuse -quit 6 yrs ago    Family Communication:   Pt at beside Disposition Plan:   Home when medically stable        Procedures/Studies:  No results found.      Subjective: Patient complains of bilateral lower extremity edema with some pain and discomfort. Denies any fevers, chills, chest pain, shortness breath, , vomiting, hematochezia, melena, abdominal pain, headache, dizziness.   Objective: Filed Vitals:   03/17/14 0615 03/17/14 0715 03/17/14 0749 03/17/14 0825  BP: 152/91 162/87 160/87 152/87  Pulse: 73 78 80 90  Temp: 98.1 F (36.7 C) 98.1 F (36.7 C) 98.1 F (36.7 C) 98.2 F (36.8 C)  TempSrc: Oral Oral Oral  Oral  Resp: 16 16 16 16   Height:      Weight:      SpO2: 100% 100% 100% 100%    Intake/Output Summary (Last 24 hours) at 03/17/14 0859 Last data filed at 03/17/14 0825  Gross per 24 hour  Intake    475 ml  Output    300 ml  Net    175 ml   Weight change:  Exam:   General:  Pt is alert, follows commands appropriately, not in acute distress  HEENT: No icterus, No thrush, [place]/AT  Cardiovascular: RRR, S1/S2, no rubs, no gallops  Respiratory: diminished BS but CTA bilaterally, no wheezing, no crackles, no rhonchi  Abdomen: Soft/+BS, non tender, non distended, no guarding  Extremities: 1+LE edema, No lymphangitis, No petechiae, No rashes, no synovitis-fissures on the bilateral big toes with dry blood. No lymphangitis. No crepitance.   Data Reviewed: Basic Metabolic Panel:  Recent Labs Lab 03/17/14 0013  NA 140  K 4.4  CL 104  CO2 19  GLUCOSE 75  BUN 19  CREATININE 0.90  CALCIUM 9.2   Liver Function Tests:  Recent Labs Lab 03/17/14 0013  AST 37  ALT 19  ALKPHOS 89  BILITOT 0.2*  PROT 8.5*  ALBUMIN 3.1*   No results found for this basename: LIPASE, AMYLASE,  in the last 168 hours No results found for this basename: AMMONIA,  in the last 168 hours CBC:  Recent Labs Lab 03/17/14 0013  WBC 7.5  NEUTROABS 2.9  HGB 6.6*  HCT 18.6*  MCV 94.4  PLT 441*   Cardiac Enzymes: No results found for this basename: CKTOTAL, CKMB, CKMBINDEX, TROPONINI,  in  the last 168 hours BNP: No components found with this basename: POCBNP,  CBG: No results found for this basename: GLUCAP,  in the last 168 hours  No results found for this or any previous visit (from the past 240 hour(s)).   Scheduled Meds: . sodium chloride   Intravenous Once  . enoxaparin (LOVENOX) injection  30 mg Subcutaneous Q24H  . [START ON 03/18/2014] Influenza vac split quadrivalent PF  0.5 mL Intramuscular Tomorrow-1000  . lisinopril  10 mg Oral Daily  . pantoprazole  40 mg Oral Daily  . [START  ON 03/18/2014] pneumococcal 23 valent vaccine  0.5 mL Intramuscular Tomorrow-1000  . sodium chloride  3 mL Intravenous Q12H  . sodium chloride  3 mL Intravenous Q12H   Continuous Infusions:    Clemie General, DO  [facility] Pager [phone]  If 7PM-7AM, please contact night-coverage [email or web address] Password [email or web address] 03/17/2014, 8:59 AM   LOS: 1 day

## 2014-03-17 NOTE — Progress Notes (Signed)
VASCULAR LAB PRELIMINARY  PRELIMINARY  PRELIMINARY  PRELIMINARY  Bilateral lower extremity venous duplex completed.    Preliminary report:  Bilateral:  No evidence of DVT, superficial thrombosis, or Baker's Cyst.   Emani Taussig, RVS 03/17/2014, 3:46 PM

## 2014-03-17 NOTE — Progress Notes (Signed)
Echocardiogram 2D Echocardiogram has been performed.  Doyle Askew 03/17/2014, 12:29 PM

## 2014-03-17 NOTE — ED Notes (Signed)
Hgb 6.6, KWard,RN and Dr. Florina Ou notified

## 2014-03-18 ENCOUNTER — Encounter (HOSPITAL_COMMUNITY)
Admission: EM | Disposition: A | Discharge status: Home Health | Payer: Self-pay | Source: Home / Self Care | Attending: Internal Medicine

## 2014-03-18 ENCOUNTER — Encounter (HOSPITAL_COMMUNITY): Payer: Self-pay

## 2014-03-18 DIAGNOSIS — D500 Iron deficiency anemia secondary to blood loss (chronic): Secondary | ICD-10-CM | POA: Diagnosis not present

## 2014-03-18 HISTORY — PX: COLONOSCOPY: SHX5424

## 2014-03-18 HISTORY — PX: ESOPHAGOGASTRODUODENOSCOPY: SHX5428

## 2014-03-18 LAB — TYPE AND SCREEN
ABO/RH(D): A POS
Antibody Screen: NEGATIVE
UNIT DIVISION: 0
Unit division: 0

## 2014-03-18 LAB — OCCULT BLOOD, POC DEVICE: Fecal Occult Bld: NEGATIVE

## 2014-03-18 SURGERY — COLONOSCOPY
Anesthesia: Monitor Anesthesia Care

## 2014-03-18 SURGERY — COLONOSCOPY
Anesthesia: Moderate Sedation

## 2014-03-18 MED ORDER — FENTANYL CITRATE 0.05 MG/ML IJ SOLN
INTRAMUSCULAR | Status: AC
  Filled 2014-03-18: qty 4

## 2014-03-18 MED ORDER — FENTANYL CITRATE 0.05 MG/ML IJ SOLN
INTRAMUSCULAR | Status: DC | PRN
  Administered 2014-03-18 (×2): 25 ug via INTRAVENOUS

## 2014-03-18 MED ORDER — LIDOCAINE VISCOUS 2 % MT SOLN
OROMUCOSAL | Status: AC
  Filled 2014-03-18: qty 15

## 2014-03-18 MED ORDER — MIDAZOLAM HCL 10 MG/2ML IJ SOLN
INTRAMUSCULAR | Status: DC | PRN
  Administered 2014-03-18: 2 mg via INTRAVENOUS
  Administered 2014-03-18 (×2): 1 mg via INTRAVENOUS

## 2014-03-18 MED ORDER — DIPHENHYDRAMINE HCL 50 MG/ML IJ SOLN
INTRAMUSCULAR | Status: AC
  Filled 2014-03-18: qty 1

## 2014-03-18 MED ORDER — LIDOCAINE VISCOUS 2 % MT SOLN
OROMUCOSAL | Status: DC | PRN
  Administered 2014-03-18: 10 mL via OROMUCOSAL

## 2014-03-18 MED ORDER — LISINOPRIL 20 MG PO TABS
20.0000 mg | ORAL_TABLET | Freq: Every day | ORAL | Status: DC
  Administered 2014-03-19: 20 mg via ORAL
  Filled 2014-03-18: qty 1

## 2014-03-18 MED ORDER — MIDAZOLAM HCL 10 MG/2ML IJ SOLN
INTRAMUSCULAR | Status: AC
  Filled 2014-03-18: qty 4

## 2014-03-18 MED ORDER — HYDROCERIN EX CREA
TOPICAL_CREAM | Freq: Two times a day (BID) | CUTANEOUS | Status: DC
  Administered 2014-03-18 – 2014-03-19 (×2): via TOPICAL
  Filled 2014-03-18: qty 113

## 2014-03-18 NOTE — Progress Notes (Signed)
PROGRESS NOTE  Gary Stevenson:[id number] DOB: [birth year] DOA: 03/16/2014 PCP: No primary provider on file.  Interim summary  73 year old male with a history of alcohol dependence, hypertension, lung cancer presented with one week history of worsening lower extremity edema and pain. Upon workup in the emergency department, he was noted to have hemoglobin of 6.6. The patient endorsed drinking 1 pint of liquor 2-3 times per week for many years. He has 50-pack-year history of tobacco use. The patient denied any hematochezia, melena, hematemesis. He endorsed use of Aleve frequently. The patient was transfused 2 units PRBCs. Gastroenterology was consulted and EGD and colonoscopy were performed. EGD revealed antral erosions. Colonoscopy revealed small internal hemorrhoids.  Assessment/Plan:  Chronic blood loss anemia  -Suspect upper GI etiology  -Patient uses NSAIDs as well as having alcohol dependence  -FOBT is positive with brown stool  -03/18/2014 EGD--multiple antral erosions--> start PPI -03/18/2014 colonoscopy--small internal hemorrhoids -Serum B12 450, folic acid 12.5 -Discontinue Lovenox-place SCDs  Leg edema  -Urinalysis--100mg /dL protein -Venous duplex  -Urine protein creatinine ratio--1.14  -May be partly due to the patient's hypoalbuminemia and well as proteinuria -Echocardiogram--EF 65-70%, crit of diastolic dysfunction -TSH  Alcohol dependence  -Alcohol withdrawal protocol  -UDS--negative  Hypertension  -Continue lisinopril--increase to 20 mg--this will also help with the patient's proteinuria  History of squamous cell carcinoma of the right lung  -s/p RML lobectomy in August 2010  Tobacco abuse  -quit 6 yrs ago  Family Communication: updated family at bedside, >35 min, >50% spent counseling and coordinating care Disposition Plan: Home when medically stable     Procedures/Studies:  No results found.      Subjective: Patient denies fevers, chills,  headache, chest pain, dyspnea, nausea, vomiting, diarrhea, abdominal pain, dysuria, hematuria   Objective: Filed Vitals:   03/18/14 1600 03/18/14 1610 03/18/14 1620 03/18/14 1639  BP: 184/71 158/66 187/79 169/82  Pulse: 60 59 62 63  Temp:    97.3 F (36.3 C)  TempSrc:      Resp: 22 20 18 18   Height:      Weight:      SpO2: 100% 100% 99% 100%    Intake/Output Summary (Last 24 hours) at 03/18/14 1833 Last data filed at 03/18/14 1830  Gross per 24 hour  Intake 1552.67 ml  Output    325 ml  Net 1227.67 ml   Weight change:  Exam:   General:  Pt is alert, follows commands appropriately, not in acute distress  HEENT: No icterus, No thrush,  [place]/AT  Cardiovascular: RRR, S1/S2, no rubs, no gallops  Respiratory: CTA bilaterally, no wheezing, no crackles, no rhonchi  Abdomen: Soft/+BS, non tender, non distended, no guarding  Extremities: trace LE edema, No lymphangitis, No petechiae, No rashes, no synovitis  Data Reviewed: Basic Metabolic Panel:  Recent Labs Lab 03/17/14 0013 03/17/14 1352  NA 140 139  K 4.4 4.6  CL 104 106  CO2 19 20  GLUCOSE 75 104*  BUN 19 20  CREATININE 0.90 0.86  CALCIUM 9.2 8.6   Liver Function Tests:  Recent Labs Lab 03/17/14 0013  AST 37  ALT 19  ALKPHOS 89  BILITOT 0.2*  PROT 8.5*  ALBUMIN 3.1*   No results found for this basename: LIPASE, AMYLASE,  in the last 168 hours No results found for this basename: AMMONIA,  in the last 168 hours CBC:  Recent Labs Lab 03/17/14 0013 03/17/14 1352  WBC 7.5 5.7  NEUTROABS  2.9  --   HGB 6.6* 13.5  HCT 18.6* 38.8*  MCV 94.4 91.3  PLT 441* 265   Cardiac Enzymes: No results found for this basename: CKTOTAL, CKMB, CKMBINDEX, TROPONINI,  in the last 168 hours BNP: No components found with this basename: POCBNP,  CBG: No results found for this basename: GLUCAP,  in the last 168 hours  No results found for this or any previous visit (from the past 240 hour(s)).   Scheduled Meds: .  sodium chloride   Intravenous Once  . hydrocerin   Topical BID  . lisinopril  10 mg Oral Daily  . pantoprazole  40 mg Oral Daily  . peg 3350 powder  0.5 kit Oral Once  . sodium chloride  3 mL Intravenous Q12H  . sodium chloride  3 mL Intravenous Q12H   Continuous Infusions: . sodium chloride 20 mL/hr at 03/18/14 1457     Oley Lahaie, DO  [facility] Pager [phone]  If 7PM-7AM, please contact night-coverage [email or web address] Password [email or web address] 03/18/2014, 6:33 PM   LOS: 2 days

## 2014-03-18 NOTE — Evaluation (Signed)
Physical Therapy Evaluation Patient Details Name: Gary Stevenson MRN: [id number] DOB: [birth year] Today's Date: 03/18/2014   History of Present Illness  73 y.o. male adm with   worsening edema and pain and cracking of the skin of his lower legs and  feet ; PMHx:history of Alcohol Abuse, HTN, Lung Cancer  Clinical Impression  Pt will benefit from PT to address deficits below; Will likely be able to D/C home with HHPT, may need RW, will continue to assess    Follow Up Recommendations Home health PT    Equipment Recommendations  Rolling walker with 5" wheels;None recommended by PT (vs no DME depending on progress)    Recommendations for Other Services       Precautions / Restrictions Precautions Precautions: Fall Restrictions Weight Bearing Restrictions: No      Mobility  Bed Mobility Overal bed mobility: Needs Assistance Bed Mobility: Supine to Sit     Supine to sit: Supervision;Min guard     General bed mobility comments: cues for task completion, safety  Transfers Overall transfer level: Needs assistance Equipment used: Rolling walker (2 wheeled) Transfers: Sit to/from Stand Sit to Stand: Min guard         General transfer comment: cues for hand placement, safety  Ambulation/Gait Ambulation/Gait assistance: Min assist Ambulation Distance (Feet): 100 Feet Assistive device: Rolling walker (2 wheeled) Gait Pattern/deviations: Step-through pattern;Decreased stride length     General Gait Details: cues for  safety,  RW poistion from self  Stairs            Wheelchair Mobility    Modified Rankin (Stroke Patients Only)       Balance Overall balance assessment: Needs assistance         Standing balance support: During functional activity;Bilateral upper extremity supported Standing balance-Leahy Scale: Poor                               Pertinent Vitals/Pain Pain Assessment: 0-10 Pain Score: 3  Pain Location: feet Pain  Descriptors / Indicators: Sore    Home Living Family/patient expects to be discharged to:: Private residence Living Arrangements: Alone   Type of Home: Apartment Home Access: Level entry     Home Layout: One level Home Equipment: None Additional Comments: states friends check on him and bring food sometimes    Prior Function Level of Independence: Independent               Hand Dominance        Extremity/Trunk Assessment   Upper Extremity Assessment: Defer to OT evaluation;Generalized weakness           Lower Extremity Assessment: Generalized weakness         Communication   Communication: No difficulties  Cognition Arousal/Alertness: Awake/alert Behavior During Therapy: WFL for tasks assessed/performed Overall Cognitive Status: Within Functional Limits for tasks assessed                      General Comments      Exercises        Assessment/Plan    PT Assessment Patient needs continued PT services  PT Diagnosis Difficulty walking   PT Problem List Decreased strength;Decreased activity tolerance;Decreased balance;Decreased mobility;Decreased knowledge of use of DME  PT Treatment Interventions DME instruction;Gait training;Functional mobility training;Therapeutic activities;Therapeutic exercise;Patient/family education   PT Goals (Current goals can be found in the Care Plan section) Acute Rehab PT Goals Patient Stated Goal:  home PT Goal Formulation: With patient Time For Goal Achievement: 03/25/14    Frequency Min 3X/week   Barriers to discharge        Co-evaluation               End of Session Equipment Utilized During Treatment: Gait belt Activity Tolerance: Patient tolerated treatment well Patient left: in chair;with call bell/phone within reach Nurse Communication: Mobility status         Time: 1120-1136 PT Time Calculation (min): 16 min   Charges:   PT Evaluation $Initial PT Evaluation Tier I: 1 Procedure PT  Treatments $Gait Training: 8-22 mins   PT G Codes:          Tamar Miano 2014-04-14, 1:59 PM

## 2014-03-18 NOTE — Consult Note (Signed)
WOC wound consult note Reason for Consult: evaluation of feet for skin care. Extremely dry skin over the heels and plantar surface some fissures noted at the great toes and heels with dried blood not actively bleeding.   Wound type: hyperkeratosis of the plantar foot and the heels.  Pressure Ulcer POA:No Dressing procedure/placement/frequency: recommended soaking the feet and cleansing them well. Dry well and apply Eucerin daily.  Teach patient same.  Discussed POC with patient and bedside nurse.  Re consult if needed, will not follow at this time. Thanks  Chemere Steffler [place], [place] [phone])

## 2014-03-18 NOTE — Progress Notes (Signed)
Patient noted to have 3 beats then 6 beats of v-tach on monitor.  Patient had no s/s, BP 166/88, other VSS.  MD aware. Will continue to monitor patient.

## 2014-03-18 NOTE — Op Note (Signed)
[place], [id number]   OPERATIVE PROCEDURE REPORT  PATIENT: Gary Stevenson, Gary Stevenson  MR#: [id number] BIRTHDATE: [birth year] GENDER: male ENDOSCOPIST: Edmonia James, MD ASSISTANT:   Elna Breslow, RN Corliss Parish, technician PROCEDURE DATE: 03/18/2014 PRE-PROCEDURE PREPARATION: The patient was prepped with 2 dulcolax tablets, one ten-ounce bottle of magnesium citrate, and a gallon of Golytely the night prior to the procedure.  The patient was fasted for 8 hours prior to the procedure. PRE-PROCEDURE PHYSICAL: Patient has stable vital signs.  Neck is supple.  There is no JVD, thyromegaly or LAD.  Chest clear to auscultation.  S1 and S2 regular.  Abdomen soft, non-distended, non-tender with NABS. PROCEDURE:     Colonoscopy, diagnostic ASA CLASS:     Class III INDICATIONS:     1.  Heme positive stools 2. Anemia  2. Colorectal cancer screening. MEDICATIONS:     Midazolam (Versed) 1 mg  DESCRIPTION OF PROCEDURE: After the risks, benefits, and alternatives of the procedure were thoroughly explained [including a 10% missed rate of cancer and polyps], informed consent was obtained.  Digital rectal exam was performed.  The Pentax Adult Colon [id number]  was introduced through the anus  and advanced to the cecum, which was identified by both the appendix and ileocecal valve , limited by No adverse events experienced. The quality of the prep was fair. Multiple washes were done. Small lesions could be missed. The instrument was then slowly withdrawn as the colon was fully examined.     COLON FINDINGS: The entire colonic mucosa appeared healthy with a normal vascular pattern.  No masses, polyps, diverticula or AVMs were noted.  The appendiceal orifice and the ICV were identified and photographed. Retroflexed views revealed small internal hemorrhoids. The patient tolerated the procedure without immediate complications.  The scope was then withdrawn from  the patient and the procedure terminated.  TIME TO CECUM:  6 minutes 0 seconds WITHDRAW TIME:  6 minutes 0 seconds  IMPRESSION:     1.  Normal colonoscopy upto the cecum. 2.  Small internal hemorrhoids noted noted on retroflexion.  RECOMMENDATIONS:     1.  Continue current medications 2.  High fiber diet with liberal fluid intake. 3.  Repeat colonscopy in 10 years.  REPEAT EXAM:      In 10 years  for colonoscopy.  If the patient has any abnormal GI symptoms in the interim, he have been advised to contact the office as soon as possible for further recommendations.    CPT CODES:     [id number], Screening Colonoscopy  DIAGNOSIS CODES:     792.1 Heme Positive Stool 280.9 Iron Deficiency Anemia V76.51 Colorectal cancer screening   REFERRED BY: THP  eSigned:  Edmonia James, MD 03/18/2014 4:20 PM   PATIENT NAME:  Byrant, Valent MR#: [id number]

## 2014-03-18 NOTE — Progress Notes (Signed)
INITIAL NUTRITION ASSESSMENT  DOCUMENTATION CODES Per approved criteria  -Not Applicable   INTERVENTION: -Recommend Ensure supplement once diet advancement -Encouraged intake of protein/kcal for weight maintenance -RD to continue to monitor  NUTRITION DIAGNOSIS: Unintentional wt loss related to inadequate oral intake/chronic disease as evidenced by 10 lb weight loss in 1-2months.   Goal: Pt to meet >/= 90% of their estimated nutrition needs    Monitor:  Total protein/energy intake, labs, weights, diet order, GI profile  Reason for Assessment: MST  73 y.o. male  Admitting Dx: Anemia  ASSESSMENT: Gary Stevenson is a 73 y.o. male with a history of Alcohol Abuse, HTN, Lung Cancer who presents to the ED with complaints of worsening edema and pain and cracking of the skin of his lower Legs adn feet x 1 week.  -Pt reported an unintentional weight of 10 lbs in past 1-2 months. Current weight likely impacted by bilateral lower extremity edema -Denied significant change in appetite, reported to be eating balanced meals and consuming a daily MVI. Does has hx of ETOH abuse (MD noted use of 1 pint liquor 2-3times/week) and lung cancer that likely contributed to weight loss and sub-optimal nutrient intake -NPO for EGD d/t possible concern for GI bleed -Was willing to trial Ensure once diet advancement tolerated  Height: Ht Readings from Last 1 Encounters:  03/17/14 5' 6" (1.676 m)    Weight: Wt Readings from Last 1 Encounters:  03/17/14 125 lb 14.1 oz (57.1 kg)    Ideal Body Weight: 142 lb  % Ideal Body Weight: 88%  Wt Readings from Last 10 Encounters:  03/17/14 125 lb 14.1 oz (57.1 kg)  03/17/14 125 lb 14.1 oz (57.1 kg)  03/17/14 125 lb 14.1 oz (57.1 kg)  01/03/09 129 lb (58.514 kg)    Usual Body Weight: unable to determine  % Usual Body Weight: unable to determine  BMI:  Body mass index is 20.33 kg/(m^2).  Estimated Nutritional Needs: Kcal:1700-1900 Protein: 70-80  gram Fluid: >/=1700 ml/daily  Skin:+2 RLE edema, +3 LLE edema  Diet Order: NPO  EDUCATION NEEDS: -No education needs identified at this time   Intake/Output Summary (Last 24 hours) at 03/18/14 1208 Last data filed at 03/18/14 1048  Gross per 24 hour  Intake 1640.67 ml  Output    525 ml  Net 1115.67 ml    Last BM: 9/20   Labs:   Recent Labs Lab 03/17/14 0013 03/17/14 1352  NA 140 139  K 4.4 4.6  CL 104 106  CO2 19 20  BUN 19 20  CREATININE 0.90 0.86  CALCIUM 9.2 8.6  GLUCOSE 75 104*    CBG (last 3)  No results found for this basename: GLUCAP,  in the last 72 hours  Scheduled Meds: . sodium chloride   Intravenous Once  . Influenza vac split quadrivalent PF  0.5 mL Intramuscular Tomorrow-1000  . lisinopril  10 mg Oral Daily  . pantoprazole  40 mg Oral Daily  . peg 3350 powder  0.5 kit Oral Once  . pneumococcal 23 valent vaccine  0.5 mL Intramuscular Tomorrow-1000  . sodium chloride  3 mL Intravenous Q12H  . sodium chloride  3 mL Intravenous Q12H    Continuous Infusions: . sodium chloride 20 mL/hr at 03/18/14 0552    Past Medical History  Diagnosis Date  . ETOH abuse     intoxicated  . Lung cancer   . Brain aneurysm     Past Surgical History  Procedure Laterality Date  .   Brain surgery    . Lung surgery       F  MS RD LDN Clinical Dietitian Pager:[phone]   

## 2014-03-18 NOTE — Op Note (Addendum)
[place], [id number]   OPERATIVE PROCEDURE REPORT  PATIENT :Gary Stevenson, Gary Stevenson  MR#: [id number] BIRTHDATE :1940/11/24 GENDER: male ENDOSCOPIST: Edmonia James, MD ASSISTANT:   Elna Breslow, RN Corliss Parish, technician PROCEDURE DATE: 03/20/14 PRE-PROCEDURE PREPERATION: Patient fasted for 4 hours prior to procedure. PRE-PROCEDURE PHYSICAL: Patient has stable vital signs.  Neck is supple.  There is no JVD, thyromegaly or LAD.  S1 and S2 regular. Abdomen soft, non-distended, non-tender with NABS. PROCEDURE:     EGD, diagnostic ASA CLASS:     Class III INDICATIONS:     anemia. MEDICATIONS:     Midazolam  4 mg and Fentanyl 50 mg IV TOPICAL ANESTHETIC:   Viscous xylocaine-10 cc PO. -10 cc DESCRIPTION OF PROCEDURE: After the risks benefits and alternatives of the procedure were thoroughly explained, informed consent was obtained. The PENTAX GASTOROSCOPE [id number]  was introduced through the mouth and advanced to the second portion of the duodenum , without limitations. The instrument was slowly withdrawn as the mucosa was fully examined.        ESOPHAGUS: The mucosa of the esophagus appeared normal.  STOMACH: Multiple shallow erosions were found in the gastric antrum.; a small hiatal hernia was noted on retroflexion.  DUODENUM: The duodenal mucosa showed no abnormalities.  The esophagus, stomach and the proximal small bowel appeared normal. There were no ulcers, erosions, masses or polyps noted. Retroflexed views revealed no abnormalities. The scope was then withdrawn from the patient and the procedure terminated. The patient tolerated the procedure without immediate complications.  IMPRESSION:  Multiple antral erosion and a small hiatal hernia; otherwise, normal esophagogastroduodenoscopy.  RECOMMENDATIONS:     1.  Anti-reflux regimen to be followed. 2.  Continue current medications. 3.  Continue PPI 's. 4. Proceed with a  colonoscopy.  REPEAT EXAM: None planned for now.  DISCHARGE INSTRUCTIONS: standard discharge instructions given. _______________________________ eSignedEdmonia James, MD March 20, 2014 4:07 PM Revised: March 20, 2014 4:07 PM  CPT CODES:     83254, EGD  DIAGNOSIS CODES:     280.9 Iron Deficiency Anemia 792.1 Heme Positive Stool   CC: THP  PATIENT NAME:  Gary Stevenson, Gary Stevenson MR#: [id number]

## 2014-03-19 ENCOUNTER — Encounter (HOSPITAL_COMMUNITY): Payer: Self-pay | Admitting: Gastroenterology

## 2014-03-19 LAB — CBC
HEMATOCRIT: 38.4 % — AB (ref 39.0–52.0)
Hemoglobin: 13.3 g/dL (ref 13.0–17.0)
MCH: 32 pg (ref 26.0–34.0)
MCHC: 34.6 g/dL (ref 30.0–36.0)
MCV: 92.3 fL (ref 78.0–100.0)
PLATELETS: 283 10*3/uL (ref 150–400)
RBC: 4.16 MIL/uL — ABNORMAL LOW (ref 4.22–5.81)
RDW: 18.2 % — AB (ref 11.5–15.5)
WBC: 6.7 10*3/uL (ref 4.0–10.5)

## 2014-03-19 MED ORDER — PANTOPRAZOLE SODIUM 40 MG PO TBEC: 40.0000 mg | DELAYED_RELEASE_TABLET | Freq: Every day | ORAL | Status: DC

## 2014-03-19 MED ORDER — LISINOPRIL 20 MG PO TABS: 20.0000 mg | ORAL_TABLET | Freq: Every day | ORAL | Status: DC

## 2014-03-19 NOTE — Discharge Summary (Signed)
Physician Discharge Summary  Gary Stevenson ZOX:[id number] DOB: 1940-07-18 DOA: 03/16/2014  PCP: No primary provider on file.  Admit date: 03/16/2014 Discharge date: 03/19/2014  Recommendations for Outpatient Follow-up:  1. Pt will need to follow up with PCP in 2 weeks post discharge 2. Please obtain BMP and CBC in one week   Discharge Diagnoses:  Chronic blood loss anemia  -Patient uses NSAIDs as well as having alcohol dependence  -FOBT is positive with brown stool  -03/18/2014 EGD--multiple antral erosions--> start PPI  -home with protonix 40mg  q day -03/18/2014 colonoscopy--small internal hemorrhoids  -Serum B12 450, folic acid 12.5  -Discontinue Lovenox-place SCDs  -Counseled on abstinence to alcohol and avoiding NSAIDs -Patient was transfused with 2 units PRBCs and his hemoglobin remained stable thereafter Leg edema  -Urinalysis--100mg /dL protein  -Venous duplex--negative for DVT  -Urine protein creatinine ratio--1.14  -May be partly due to the patient's hypoalbuminemia and well as proteinuria as noted by urine protein/creatinine ratio -Echocardiogram--EF 65-70%, crit of diastolic dysfunction  -Lisinopril was increased to 20 mg which will help with the patient's proteinuria as well as his hypertension  Alcohol dependence  -Alcohol withdrawal protocol  -UDS--negative  Hypertension  -Continue lisinopril--increase to 20 mg--this will also help with the patient's proteinuria  History of squamous cell carcinoma of the right lung  -s/p RML lobectomy in August 2010  Tobacco abuse  -quit 6 yrs ago     Discharge Condition: Stable  Disposition:  home  Diet: Heart healthy Wt Readings from Last 3 Encounters:  03/17/14 57.1 kg (125 lb 14.1 oz)  03/17/14 57.1 kg (125 lb 14.1 oz)  03/17/14 57.1 kg (125 lb 14.1 oz)    History of present illness:   73 year old male with a history of alcohol dependence, hypertension, lung cancer presented with one week history of worsening  lower extremity edema and pain. Upon workup in the emergency department, he was noted to have hemoglobin of 6.6. The patient endorsed drinking 1 pint of liquor 2-3 times per week for many years. He has 50-pack-year history of tobacco use. The patient denied any hematochezia, melena, hematemesis. He endorsed use of Aleve frequently. The patient was transfused 2 units PRBCs. Gastroenterology was consulted and EGD and colonoscopy were performed. EGD revealed antral erosions. Colonoscopy revealed small internal hemorrhoids.     Consultants: GI--Dr. Loreta Ave  Discharge Exam: Filed Vitals:   03/19/14 0448  BP: 104/55  Pulse: 90  Temp: 98.6 F (37 C)  Resp: 18   Filed Vitals:   03/18/14 2040 03/18/14 2308 03/19/14 0104 03/19/14 0448  BP: 173/75 129/59 129/60 104/55  Pulse: 70 84 88 90  Temp: 98 F (36.7 C)  98.9 F (37.2 C) 98.6 F (37 C)  TempSrc: Oral  Oral Oral  Resp: 18  18 18   Height:      Weight:      SpO2: 100%  100% 100%   General: A&O x 3, NAD, pleasant, cooperative Cardiovascular: RRR, no rub, no gallop, no S3 Respiratory: CTAB, no wheeze, no rhonchi Abdomen:soft, nontender, nondistended, positive bowel sounds Extremities: trace LE edema, No lymphangitis, no petechiae  Discharge Instructions  Discharge Instructions   Diet - low sodium heart healthy    Complete by:  As directed      Increase activity slowly    Complete by:  As directed             Medication List    STOP taking these medications       naproxen sodium 220 MG  tablet  Commonly known as:  ANAPROX      TAKE these medications       clotrimazole-betamethasone cream  Commonly known as:  LOTRISONE  Apply 1 application topically 2 (two) times daily.     lisinopril 20 MG tablet  Commonly known as:  PRINIVIL,ZESTRIL  Take 1 tablet (20 mg total) by mouth daily.     multivitamin with minerals Tabs tablet  Take 1 tablet by mouth daily.     pantoprazole 40 MG tablet  Commonly known as:  PROTONIX    Take 1 tablet (40 mg total) by mouth daily.     triamcinolone ointment 0.1 %  Commonly known as:  KENALOG  Apply 1 application topically 2 (two) times daily.         The results of significant diagnostics from this hospitalization (including imaging, microbiology, ancillary and laboratory) are listed below for reference.    Significant Diagnostic Studies: No results found.   Microbiology: No results found for this or any previous visit (from the past 240 hour(s)).   Labs: Basic Metabolic Panel:  Recent Labs Lab 03/17/14 0013 03/17/14 1352  NA 140 139  K 4.4 4.6  CL 104 106  CO2 19 20  GLUCOSE 75 104*  BUN 19 20  CREATININE 0.90 0.86  CALCIUM 9.2 8.6   Liver Function Tests:  Recent Labs Lab 03/17/14 0013  AST 37  ALT 19  ALKPHOS 89  BILITOT 0.2*  PROT 8.5*  ALBUMIN 3.1*   No results found for this basename: LIPASE, AMYLASE,  in the last 168 hours No results found for this basename: AMMONIA,  in the last 168 hours CBC:  Recent Labs Lab 03/17/14 0013 03/17/14 1352 03/19/14 0415  WBC 7.5 5.7 6.7  NEUTROABS 2.9  --   --   HGB 6.6* 13.5 13.3  HCT 18.6* 38.8* 38.4*  MCV 94.4 91.3 92.3  PLT 441* 265 283   Cardiac Enzymes: No results found for this basename: CKTOTAL, CKMB, CKMBINDEX, TROPONINI,  in the last 168 hours BNP: No components found with this basename: POCBNP,  CBG: No results found for this basename: GLUCAP,  in the last 168 hours  Time coordinating discharge:  Greater than 30 minutes  Signed:  Amyla Heffner, DO [facility] Pager: [phone] 03/19/2014, 8:41 AM

## 2014-03-19 NOTE — Evaluation (Signed)
Occupational Therapy Evaluation Patient Details Name: Gary Stevenson MRN: [id number] DOB: 02/19/1941 Today's Date: 03/19/2014    History of Present Illness 73 y.o. male adm with   worsening edema and pain and cracking of the skin of his lower legs and  feet ; PMHx:history of Alcohol Abuse, HTN, Lung Cancer   Clinical Impression   Pt presents to OT with decreased I with ADL activity due to problems below.  Pt will benefit from skilled OT to increase I with ADL activity and return to PLOF    Follow Up Recommendations  Home health OT;Supervision - Intermittent    Equipment Recommendations  3 in 1 bedside comode;Tub/shower seat    Recommendations for Other Services       Precautions / Restrictions Precautions Precautions: Fall Restrictions Weight Bearing Restrictions: No      Mobility Bed Mobility Overal bed mobility: Needs Assistance Bed Mobility: Supine to Sit     Supine to sit: Supervision     General bed mobility comments: cues for task completion, safety  Transfers Overall transfer level: Needs assistance Equipment used: Rolling walker (2 wheeled)   Sit to Stand: Supervision         General transfer comment: cues for hand placement, safety    Balance                                            ADL Overall ADL's : Needs assistance/impaired Eating/Feeding: Independent;Bed level   Grooming: Set up;Sitting   Upper Body Bathing: Set up;Sitting   Lower Body Bathing: Minimal assistance;Sit to/from stand   Upper Body Dressing : Set up;Sitting   Lower Body Dressing: Sit to/from stand;Min guard   Toilet Transfer: Minimal assistance;Comfort height toilet   Toileting- Clothing Manipulation and Hygiene: Minimal assistance;Sit to/from stand       Functional mobility during ADLs: Minimal assistance       Vision                     Perception     Praxis      Pertinent Vitals/Pain Pain Assessment: No/denies pain      Hand Dominance     Extremity/Trunk Assessment Upper Extremity Assessment Upper Extremity Assessment: Generalized weakness           Communication Communication Communication: No difficulties   Cognition Arousal/Alertness: Awake/alert Behavior During Therapy: WFL for tasks assessed/performed Overall Cognitive Status: Within Functional Limits for tasks assessed                     General Comments       Exercises       Shoulder Instructions      Home Living Family/patient expects to be discharged to:: Private residence Living Arrangements: Alone   Type of Home: Apartment Home Access: Level entry     Home Layout: One level     Bathroom Shower/Tub: Teacher, early years/pre: Standard     Home Equipment: None   Additional Comments: states friends check on him and bring food sometimes      Prior Functioning/Environment               OT Diagnosis: Generalized weakness   OT Problem List: Decreased strength;Decreased activity tolerance;Impaired balance (sitting and/or standing)   OT Treatment/Interventions:      OT Goals(Current goals can be found in the  care plan section) Acute Rehab OT Goals Patient Stated Goal: home OT Goal Formulation: With patient Time For Goal Achievement: 04/02/14 Potential to Achieve Goals: Good ADL Goals Pt Will Perform Grooming: with modified independence;standing Pt Will Perform Upper Body Bathing: with modified independence;sitting Pt Will Perform Lower Body Bathing: with modified independence;sit to/from stand Pt Will Transfer to Toilet: with modified independence;regular height toilet Pt Will Perform Toileting - Clothing Manipulation and hygiene: with modified independence;sit to/from stand Pt Will Perform Tub/Shower Transfer: with supervision;shower seat  OT Frequency:     Barriers to D/C:            Co-evaluation              End of Session Nurse Communication: Mobility  status  Activity Tolerance: Patient tolerated treatment well Patient left: in chair;with call bell/phone within reach   Time: 0915-0940 OT Time Calculation (min): 25 min Charges:  OT General Charges $OT Visit: 1 Procedure OT Evaluation $Initial OT Evaluation Tier I: 1 Procedure OT Treatments $Self Care/Home Management : 23-37 mins G-Codes:    Payton Mccallum D 03-24-2014, 9:40 AM

## 2014-03-19 NOTE — Progress Notes (Signed)
Subjective: No complaints.  Feeling well.  Objective: Vital signs in last 24 hours: Temp:  [97.3 F (36.3 C)-98.9 F (37.2 C)] 98.8 F (37.1 C) (09/22 1402) Pulse Rate:  [62-90] 85 (09/22 1402) Resp:  [18-20] 20 (09/22 1402) BP: (104-187)/(55-87) 148/87 mmHg (09/22 1402) SpO2:  [99 %-100 %] 100 % (09/22 1402) Last BM Date: 03/17/14  Intake/Output from previous day: 09/21 0701 - 09/22 0700 In: 1182.7 [P.O.:690; I.V.:492.7] Out: 600 [Urine:600] Intake/Output this shift: Total I/O In: 480 [P.O.:480] Out: -   General appearance: alert and no distress GI: soft, non-tender; bowel sounds normal; no masses,  no organomegaly  Lab Results:  Recent Labs  03/17/14 0013 03/17/14 1352 03/19/14 0415  WBC 7.5 5.7 6.7  HGB 6.6* 13.5 13.3  HCT 18.6* 38.8* 38.4*  PLT 441* 265 283   BMET  Recent Labs  03/17/14 0013 03/17/14 1352  NA 140 139  K 4.4 4.6  CL 104 106  CO2 19 20  GLUCOSE 75 104*  BUN 19 20  CREATININE 0.90 0.86  CALCIUM 9.2 8.6   LFT  Recent Labs  03/17/14 0013  PROT 8.5*  ALBUMIN 3.1*  AST 37  ALT 19  ALKPHOS 89  BILITOT 0.2*   PT/INR  Recent Labs  03/17/14 1352  LABPROT 13.6  INR 1.04   Hepatitis Panel No results found for this basename: HEPBSAG, HCVAB, HEPAIGM, HEPBIGM,  in the last 72 hours C-Diff No results found for this basename: CDIFFTOX,  in the last 72 hours Fecal Lactopherrin No results found for this basename: FECLLACTOFRN,  in the last 72 hours  Studies/Results: No results found.  Medications:  Scheduled: . sodium chloride   Intravenous Once  . hydrocerin   Topical BID  . lisinopril  20 mg Oral Daily  . pantoprazole  40 mg Oral Daily  . peg 3350 powder  0.5 kit Oral Once  . sodium chloride  3 mL Intravenous Q12H  . sodium chloride  3 mL Intravenous Q12H   Continuous: . sodium chloride 20 mL/hr at 03/18/14 1457    Assessment/Plan: 1) Severe anemia of unclear etiology. 2) Antral erosions. 3) NSAID  use.  Plan: 1) Follow up with PCP to track HGB. 2) If HGB continues to decline again he can follow up with Dr. Collene Mares for a capsule endoscopy. 3) Avoid NSAIDs. 4) PPI.   LOS: 3 days   Gary Stevenson D 03/19/2014, 4:14 PM

## 2014-03-19 NOTE — Care Management Note (Addendum)
    Page 1 of 2   03/20/2014     11:01:22 AM CARE MANAGEMENT NOTE 03/20/2014  Patient:  Gary Stevenson, Gary Stevenson   Account Number:  [id number]  Date Initiated:  03/19/2014  Documentation initiated by:  [facility]  Subjective/Objective Assessment:   73 Y/O M ADMITTED W/ANEMIA.     Action/Plan:   FROM HOME.   Anticipated DC Date:  03/20/2014   Anticipated DC Plan:  [place]  CM consult      Choice offered to / List presented to:  C-1 Patient   DME arranged  [place]      DME agency  [place]     HH arranged  [place].   Status of service:  Completed, signed off Medicare Important Message given?  YES (If response is "NO", the following Medicare IM given date fields will be blank) Date Medicare IM given:  03/19/2014 Medicare IM given by:  [facility] Date Additional Medicare IM given:   Additional Medicare IM given by:    Discharge Disposition:  [place]  Per UR Regulation:  Reviewed for med. necessity/level of care/duration of stay  If discussed at [place] of Stay Meetings, dates discussed:    Comments:  03/20/14 Trystin Terhune RN,BSN NCM [phone] ON 03/19/14 @5 :30P PER NSG.PATIENT D/C HOME.  03/19/14 Keina Mutch RN,BSN NCM [place] [employer] [employer]-TC APRIA1 [phone]/SPOKE TO [facility],FAXED W/CONFIRMATION TO FAX#[phone]-H&P,FACE SHEET,HOME RW ORDER,HT/WT,RM [place].THEY HAVE 4HR TIME FRAME.TC REP JAMES BRYANT TEL#[phone] TO TRY FIND OUT THE STATUS-INSURANCE VERIFICATION IN PROCESS.UPDATED NURSE.  NOTED PT RECOMMENDED RW.TC [facility] DME REP-AWARE OF RW.AWAIT DME ORDER.OT-3N1,TUB SEAT(WILL GET ON OWN) PT-HH.[facility] CHOSEN TC REP KRISTEN AWARE OF REFERRAL & D/C HHPT,SW ORDER.NO FURTHER D/C NEEDS.

## 2014-09-16 DIAGNOSIS — I8311 Varicose veins of right lower extremity with inflammation: Secondary | ICD-10-CM | POA: Diagnosis not present

## 2014-10-03 ENCOUNTER — Encounter (HOSPITAL_BASED_OUTPATIENT_CLINIC_OR_DEPARTMENT_OTHER): Payer: Commercial Managed Care - HMO | Attending: Internal Medicine

## 2014-10-03 DIAGNOSIS — L851 Acquired keratosis [keratoderma] palmaris et plantaris: Secondary | ICD-10-CM | POA: Insufficient documentation

## 2014-10-03 DIAGNOSIS — J449 Chronic obstructive pulmonary disease, unspecified: Secondary | ICD-10-CM | POA: Diagnosis not present

## 2014-10-03 DIAGNOSIS — Z85118 Personal history of other malignant neoplasm of bronchus and lung: Secondary | ICD-10-CM | POA: Insufficient documentation

## 2014-10-03 DIAGNOSIS — Z87891 Personal history of nicotine dependence: Secondary | ICD-10-CM | POA: Diagnosis not present

## 2014-11-11 ENCOUNTER — Encounter (HOSPITAL_COMMUNITY): Payer: Self-pay | Admitting: Nurse Practitioner

## 2014-11-11 ENCOUNTER — Ambulatory Visit: Payer: Self-pay | Admitting: Podiatry

## 2014-11-11 ENCOUNTER — Emergency Department (HOSPITAL_COMMUNITY)
Admission: EM | Admit: 2014-11-11 | Discharge: 2014-11-11 | Discharge status: Against Medical Advice | Payer: Commercial Managed Care - HMO | Attending: Emergency Medicine | Admitting: Emergency Medicine

## 2014-11-11 DIAGNOSIS — M79671 Pain in right foot: Secondary | ICD-10-CM | POA: Diagnosis not present

## 2014-11-11 DIAGNOSIS — R2243 Localized swelling, mass and lump, lower limb, bilateral: Secondary | ICD-10-CM | POA: Insufficient documentation

## 2014-11-11 DIAGNOSIS — M79672 Pain in left foot: Secondary | ICD-10-CM | POA: Diagnosis not present

## 2014-11-11 NOTE — ED Notes (Addendum)
He c/o bilateral foot swelling, itching and pain x weeks. He went to the foot center today and they wanted him to come to ED for further evaluation. He denies any sob or cp

## 2015-08-07 DIAGNOSIS — H2513 Age-related nuclear cataract, bilateral: Secondary | ICD-10-CM | POA: Diagnosis not present

## 2015-08-07 DIAGNOSIS — D487 Neoplasm of uncertain behavior of other specified sites: Secondary | ICD-10-CM | POA: Diagnosis not present

## 2015-08-07 DIAGNOSIS — H04123 Dry eye syndrome of bilateral lacrimal glands: Secondary | ICD-10-CM | POA: Diagnosis not present

## 2015-09-15 DIAGNOSIS — H2513 Age-related nuclear cataract, bilateral: Secondary | ICD-10-CM | POA: Diagnosis not present

## 2015-09-15 DIAGNOSIS — H04121 Dry eye syndrome of right lacrimal gland: Secondary | ICD-10-CM | POA: Diagnosis not present

## 2015-10-13 DIAGNOSIS — H40003 Preglaucoma, unspecified, bilateral: Secondary | ICD-10-CM | POA: Diagnosis not present

## 2015-11-02 DIAGNOSIS — Z947 Corneal transplant status: Secondary | ICD-10-CM | POA: Insufficient documentation

## 2015-11-02 DIAGNOSIS — H31301 Unspecified choroidal hemorrhage, right eye: Secondary | ICD-10-CM | POA: Insufficient documentation

## 2015-11-02 DIAGNOSIS — H579 Unspecified disorder of eye and adnexa: Secondary | ICD-10-CM | POA: Insufficient documentation

## 2017-11-29 ENCOUNTER — Ambulatory Visit: Payer: Medicare Other | Admitting: Podiatry

## 2017-12-13 ENCOUNTER — Ambulatory Visit: Payer: Medicare Other | Admitting: Podiatry

## 2018-07-28 ENCOUNTER — Other Ambulatory Visit: Payer: Self-pay | Admitting: Podiatry

## 2018-07-28 ENCOUNTER — Ambulatory Visit (INDEPENDENT_AMBULATORY_CARE_PROVIDER_SITE_OTHER): Payer: Medicare Other | Admitting: Podiatry

## 2018-07-28 ENCOUNTER — Ambulatory Visit (INDEPENDENT_AMBULATORY_CARE_PROVIDER_SITE_OTHER): Payer: Medicare Other

## 2018-07-28 ENCOUNTER — Encounter: Payer: Self-pay | Admitting: Podiatry

## 2018-07-28 VITALS — BP 165/91 | HR 84 | Resp 16

## 2018-07-28 DIAGNOSIS — M79671 Pain in right foot: Secondary | ICD-10-CM

## 2018-07-28 DIAGNOSIS — M79674 Pain in right toe(s): Secondary | ICD-10-CM

## 2018-07-28 DIAGNOSIS — M7752 Other enthesopathy of left foot: Secondary | ICD-10-CM | POA: Diagnosis not present

## 2018-07-28 DIAGNOSIS — M79672 Pain in left foot: Principal | ICD-10-CM

## 2018-07-28 DIAGNOSIS — B351 Tinea unguium: Secondary | ICD-10-CM

## 2018-07-28 DIAGNOSIS — M779 Enthesopathy, unspecified: Secondary | ICD-10-CM

## 2018-07-28 DIAGNOSIS — M79675 Pain in left toe(s): Secondary | ICD-10-CM

## 2018-07-28 NOTE — Patient Instructions (Signed)

## 2018-07-28 NOTE — Progress Notes (Signed)
   Subjective:    Patient ID: Gary Stevenson, male    DOB: [birth year], 78 y.o.   MRN: [id number]  HPI    Review of Systems  All other systems reviewed and are negative.      Objective:   Physical Exam        Assessment & Plan:

## 2018-10-27 ENCOUNTER — Ambulatory Visit: Payer: Medicare Other | Admitting: Podiatry

## 2018-12-06 ENCOUNTER — Other Ambulatory Visit: Payer: Self-pay

## 2018-12-06 ENCOUNTER — Encounter: Payer: Self-pay | Admitting: Podiatry

## 2018-12-06 ENCOUNTER — Ambulatory Visit (INDEPENDENT_AMBULATORY_CARE_PROVIDER_SITE_OTHER): Payer: Medicare Other | Admitting: Podiatry

## 2018-12-06 VITALS — Temp 97.8°F

## 2018-12-06 DIAGNOSIS — M79675 Pain in left toe(s): Secondary | ICD-10-CM | POA: Diagnosis not present

## 2018-12-06 DIAGNOSIS — L600 Ingrowing nail: Secondary | ICD-10-CM

## 2018-12-06 DIAGNOSIS — B351 Tinea unguium: Secondary | ICD-10-CM

## 2018-12-06 DIAGNOSIS — M79674 Pain in right toe(s): Secondary | ICD-10-CM

## 2018-12-06 NOTE — Patient Instructions (Signed)

## 2018-12-20 NOTE — Progress Notes (Signed)
Subjective:  Gary Stevenson presents to clinic today with cc of  painful, thick, discolored, elongated toenails 1-5 b/l that become tender and cannot cut because of thickness.  Today, patient relates pain in left great toe for the past few days. He suspects it is ingrown due to pain when wearing enclosed shoe gear.  Seward Carol, MD is his PCP.    Current Outpatient Medications:  Marland Kitchen  Multiple Vitamin (MULTIVITAMIN WITH MINERALS) TABS tablet, Take 1 tablet by mouth daily., Disp: , Rfl:    No Known Allergies   Objective: Vitals:   12/06/18 1354  Temp: 97.8 F (36.6 C)    Physical Examination:  Vascular Examination: Capillary refill time immediate x 10 digits.  Palpable DP/PT pulses b/l.  Digital hair absent b/l  No edema noted b/l.  Skin temperature gradient WNL b/l.  Dermatological Examination: Skin with normal turgor, texture and tone b/l.  No open wounds b/l.  No interdigital macerations noted b/l.  Elongated, thick, discolored brittle toenails with subungual debris and pain on dorsal palpation of nailbeds 1-5 b/l. Incurvated nailplate right great toe b/l borders with tenderness to palpation. No erythema, no edema, no drainage noted.  Left great toe with noted hyperkeratosis to proximal nailfold. No erythema, no edema, no drainage, no flocculence.  Musculoskeletal Examination: Muscle strength 5/5 to all muscle groups b/l.  No pain, crepitus or joint discomfort with active/passive ROM.  Neurological Examination: Sensation intact 5/5 b/l with 10 gram monofilament.  Vibratory sensation intact b/l.  Proprioceptive sensation intact b/l.  Assessment: Mycotic nail infection with pain 1-5 b/l Ingrown toenail right hallux  Plan: 1. Toenails 1-5 b/l were debrided in length and girth without iatrogenic laceration. Offending nail borders debrided and curretaged b/l great toes. Borders cleansed with alcohol. Antibiotic ointment applied. No further treatment  required by patient. Patient instructed to apply Polysporin Ointment to b/l great toes once daily for one week. Call office if he experiences any problems. Continue soft, supportive shoe gear daily. Report any pedal injuries to medical professional. Follow up 3 months. Patient/POA to call should there be a question/concern in there interim.

## 2018-12-22 ENCOUNTER — Ambulatory Visit: Payer: Medicare Other | Admitting: Podiatry

## 2019-03-13 ENCOUNTER — Ambulatory Visit: Payer: Medicare Other | Admitting: Podiatry

## 2019-07-18 ENCOUNTER — Ambulatory Visit: Payer: Medicare Other | Admitting: Podiatry

## 2019-08-01 ENCOUNTER — Encounter: Payer: Self-pay | Admitting: Podiatry

## 2019-08-01 ENCOUNTER — Ambulatory Visit (INDEPENDENT_AMBULATORY_CARE_PROVIDER_SITE_OTHER): Payer: Medicare Other | Admitting: Podiatry

## 2019-08-01 ENCOUNTER — Other Ambulatory Visit: Payer: Self-pay

## 2019-08-01 ENCOUNTER — Ambulatory Visit (INDEPENDENT_AMBULATORY_CARE_PROVIDER_SITE_OTHER): Payer: Medicare Other

## 2019-08-01 VITALS — Temp 97.6°F

## 2019-08-01 DIAGNOSIS — M79675 Pain in left toe(s): Secondary | ICD-10-CM

## 2019-08-01 DIAGNOSIS — M79672 Pain in left foot: Secondary | ICD-10-CM | POA: Diagnosis not present

## 2019-08-01 DIAGNOSIS — M79671 Pain in right foot: Secondary | ICD-10-CM

## 2019-08-01 DIAGNOSIS — M778 Other enthesopathies, not elsewhere classified: Secondary | ICD-10-CM | POA: Diagnosis not present

## 2019-08-01 DIAGNOSIS — M779 Enthesopathy, unspecified: Secondary | ICD-10-CM

## 2019-08-01 DIAGNOSIS — B351 Tinea unguium: Secondary | ICD-10-CM

## 2019-08-02 ENCOUNTER — Other Ambulatory Visit: Payer: Self-pay | Admitting: Podiatry

## 2019-08-02 DIAGNOSIS — M779 Enthesopathy, unspecified: Secondary | ICD-10-CM

## 2019-08-03 NOTE — Progress Notes (Signed)
Subjective:   Patient ID: Gary Stevenson, male   DOB: 79 y.o.   MRN: [id number]   HPI Patient continues to complain of tendinitis-like symptomatology bilateral also states that his nails have become increasingly hard for him to cut thick and brittle and they become painful as they press against his shoe gear.   ROS      Objective:  Physical Exam  Patient is noted to have dorsal tendinitis of the extensor complex and into the peroneal tendon group bilateral and is noted to have thick yellow brittle nailbeds 1-5 both feet that are painful when palpated     Assessment:  Tendinitis-like symptomatology bilateral along with mycotic nail infections     Plan:  Discussed tendinitis do not recommend treatment currently except for supportive shoes compression at nighttime and I went ahead debrided nailbeds 1-5 both feet no iatrogenic bleeding noted

## 2020-01-23 DIAGNOSIS — H353121 Nonexudative age-related macular degeneration, left eye, early dry stage: Secondary | ICD-10-CM | POA: Diagnosis not present

## 2020-01-23 DIAGNOSIS — Z947 Corneal transplant status: Secondary | ICD-10-CM | POA: Diagnosis not present

## 2020-01-23 DIAGNOSIS — H2513 Age-related nuclear cataract, bilateral: Secondary | ICD-10-CM | POA: Diagnosis not present

## 2020-06-06 DIAGNOSIS — Z947 Corneal transplant status: Secondary | ICD-10-CM | POA: Diagnosis not present

## 2020-06-06 DIAGNOSIS — H31301 Unspecified choroidal hemorrhage, right eye: Secondary | ICD-10-CM | POA: Diagnosis not present

## 2020-06-06 DIAGNOSIS — H31321 Choroidal rupture, right eye: Secondary | ICD-10-CM | POA: Diagnosis not present

## 2020-08-06 DIAGNOSIS — Z947 Corneal transplant status: Secondary | ICD-10-CM | POA: Diagnosis not present

## 2020-08-06 DIAGNOSIS — H31321 Choroidal rupture, right eye: Secondary | ICD-10-CM | POA: Diagnosis not present

## 2020-08-06 DIAGNOSIS — H31301 Unspecified choroidal hemorrhage, right eye: Secondary | ICD-10-CM | POA: Diagnosis not present

## 2020-10-03 DIAGNOSIS — H31301 Unspecified choroidal hemorrhage, right eye: Secondary | ICD-10-CM | POA: Diagnosis not present

## 2020-10-03 DIAGNOSIS — H31321 Choroidal rupture, right eye: Secondary | ICD-10-CM | POA: Diagnosis not present

## 2020-10-03 DIAGNOSIS — Z947 Corneal transplant status: Secondary | ICD-10-CM | POA: Diagnosis not present

## 2020-10-03 DIAGNOSIS — H25813 Combined forms of age-related cataract, bilateral: Secondary | ICD-10-CM | POA: Diagnosis not present

## 2020-12-12 ENCOUNTER — Other Ambulatory Visit: Payer: Self-pay

## 2020-12-12 ENCOUNTER — Ambulatory Visit
Admission: EM | Admit: 2020-12-12 | Discharge: 2020-12-12 | Disposition: A | Discharge status: Home / Self Care | Payer: Medicare HMO | Attending: Student | Admitting: Student

## 2020-12-12 DIAGNOSIS — M10032 Idiopathic gout, left wrist: Secondary | ICD-10-CM

## 2020-12-12 DIAGNOSIS — M79671 Pain in right foot: Secondary | ICD-10-CM | POA: Diagnosis not present

## 2020-12-12 DIAGNOSIS — R030 Elevated blood-pressure reading, without diagnosis of hypertension: Secondary | ICD-10-CM | POA: Diagnosis not present

## 2020-12-12 DIAGNOSIS — M79672 Pain in left foot: Secondary | ICD-10-CM | POA: Diagnosis not present

## 2020-12-12 MED ORDER — PREDNISONE 10 MG PO TABS: 40.0000 mg | ORAL_TABLET | Freq: Every day | ORAL | 0 refills | Status: AC

## 2020-12-12 NOTE — ED Triage Notes (Signed)
Pt c/o lt hand swelling and bilateral feet pain for over 2 months. Denies injury.

## 2020-12-12 NOTE — ED Provider Notes (Signed)
[facility]    CSN: [id number] Arrival date & time: 12/12/20  2725      History   Chief Complaint Chief Complaint  Patient presents with   hand swelling   feet pain    HPI AUTRY PRUST is a 80 y.o. male presenting with 2 months of L wrist swelling and bilateral foot pain, nontraumatic.  Medical history brain aneurysm, alcohol abuse, lung cancer, anemia, hypertension, COPD. This patient is not routinely followed by PCP for his chronic conditions, though he does follow with podiatry for onychomycosis.  States his main concern is the left wrist swelling.  Nontraumatic.  Worse with movement.  Also endorses pain of the bottom of both feet, this is worse with ambulating.  Also nontraumatic.  Denies fever/chills.  Denies chest pain, shortness of breath, dizziness, headaches, weakness.  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   Essential hypertension 03/17/2014   Anemia due to chronic blood loss 03/17/2014   Heme + stool 03/17/2014   Normocytic anemia 03/17/2014   COPD UNSPECIFIED 01/03/2009   DYSPNEA 01/03/2009   SWELLING/MASS/LUMP IN CHEST 01/03/2009    Past Surgical History:  Procedure Laterality Date   BRAIN SURGERY     COLONOSCOPY N/A 03/18/2014   Procedure: COLONOSCOPY;  Surgeon: Juanita Craver, MD;  Location: [facility];  Service: Endoscopy;  Laterality: N/A;   ESOPHAGOGASTRODUODENOSCOPY N/A 03/18/2014   Procedure: ESOPHAGOGASTRODUODENOSCOPY (EGD);  Surgeon: Juanita Craver, MD;  Location: [facility];  Service: Endoscopy;  Laterality: N/A;   LUNG SURGERY         Home Medications    Prior to Admission  medications   Medication Sig Start Date End Date Taking? Authorizing Provider  predniSONE (DELTASONE) 10 MG tablet Take 4 tablets (40 mg total) by mouth daily for 5 days. 12/12/20 12/17/20 Yes Hazel Sams, PA-C    Family History History reviewed. No pertinent family history.  Social History Social History   Tobacco Use   Smoking status: Former    Pack years: 0.00   Smokeless tobacco: Never  Substance Use Topics   Alcohol use: Yes    Comment: "I drink whenever I have money"   Drug use: No     Allergies   Patient has no known allergies.   Review of Systems Review of Systems  Musculoskeletal:        Foot pain L wrist pain  All other systems reviewed and are negative.   Physical Exam Triage Vital Signs ED Triage Vitals [12/12/20 1245]  Enc Vitals Group     BP (!) 190/103     Pulse Rate 89     Resp 18     Temp 98.8 F (37.1 C)     Temp Source Oral     SpO2 96 %     Weight      Height      Head Circumference      Peak Flow      Pain Score 10     Pain Loc      Pain Edu?      Excl. in [place]?  No data found.  Updated Vital Signs BP (!) 191/98 (BP Location: Left Arm)   Pulse 89   Temp 98.8 F (37.1 C) (Oral)   Resp 18   SpO2 96%   Visual Acuity Right Eye Distance:   Left Eye Distance:   Bilateral Distance:    Right Eye Near:   Left Eye Near:    Bilateral Near:     Physical Exam Vitals reviewed.  Constitutional:      General: He is not in acute distress.    Appearance: Normal appearance. He is not ill-appearing or diaphoretic.  HENT:     Head: Normocephalic and atraumatic.     Mouth/Throat:     Mouth: Mucous membranes are moist.  Eyes:     Extraocular Movements: Extraocular movements intact.     Pupils: Pupils are equal, round, and reactive to light.  Cardiovascular:     Rate and Rhythm: Normal rate and regular rhythm.     Pulses:          Radial pulses are 2+ on the right side and 2+ on the left side.     Heart sounds: Normal heart  sounds.  Pulmonary:     Effort: Pulmonary effort is normal.     Breath sounds: Normal breath sounds.  Abdominal:     Palpations: Abdomen is soft.     Tenderness: There is no abdominal tenderness. There is no guarding or rebound.  Musculoskeletal:     Right lower leg: No edema.     Left lower leg: No edema.     Comments: L wrist is diffusely swollen and tender. ROM intact but with pain. Sensation intact. Grip strength 4/4. Radial pulse 2+. No obvious bony deformity.   Plantar aspect of bilateral feet is TTP. Ambulating with pain. Sensation intact. Toenails are hypertrophic.  Skin:    General: Skin is warm.     Capillary Refill: Capillary refill takes less than 2 seconds.  Neurological:     General: No focal deficit present.     Mental Status: He is alert and oriented to person, place, and time.  Psychiatric:        Mood and Affect: Mood normal.        Behavior: Behavior normal.        Thought Content: Thought content normal.        Judgment: Judgment normal.     UC Treatments / Results  Labs (all labs ordered are listed, but only abnormal results are displayed) Labs Reviewed - No data to display  EKG   Radiology No results found.  Procedures Procedures (including critical care time)  Medications Ordered in UC Medications - No data to display  Initial Impression / Assessment and Plan / UC Course  I have reviewed the triage vital signs and the nursing notes.  Pertinent labs & imaging results that were available during my care of the patient were reviewed by me and considered in my medical decision making (see chart for details).     This patient is an 80 year old male presenting with gout left wrist and suspected plantar fasciitis bilateral feet.  He does eat red meat, denies beer or seafood.  EKG unchanged from 2013 EKG.  Prednisone sent as below, he is not a diabetic.  Follow-up with primary care if blood pressure continues to be elevated.  Strict ED return  precautions discussed. Patient verbalizes understanding and agreement.   Final Clinical Impressions(s) / UC Diagnoses   Final diagnoses:  Elevated blood pressure  reading  Acute idiopathic gout of left wrist  Foot pain, bilateral     Discharge Instructions      -Prednisone, 2 pills taken at the same time for 5 days in a row.  Try taking this earlier in the day as it can give you energy. Avoid ibuprofen while taking this medication as it can cause stomach upset. -Follow-up with your podiatrist to discuss your toenail and foot pain -Please check your blood pressure at home or at the pharmacy. If this continues to be >140/90, follow-up with your primary care provider for further blood pressure management/ medication titration. If you develop chest pain, shortness of breath, vision changes, the worst headache of your life- head straight to the ED or call 911. -Seek      ED Prescriptions     Medication Sig Dispense Auth. Provider   predniSONE (DELTASONE) 10 MG tablet Take 4 tablets (40 mg total) by mouth daily for 5 days. 20 tablet Hazel Sams, PA-C      PDMP not reviewed this encounter.   Hazel Sams, PA-C 12/12/20 1327

## 2020-12-12 NOTE — Discharge Instructions (Addendum)
-  Prednisone, 2 pills taken at the same time for 5 days in a row.  Try taking this earlier in the day as it can give you energy. Avoid ibuprofen while taking this medication as it can cause stomach upset. -Follow-up with your podiatrist to discuss your toenail and foot pain -Please check your blood pressure at home or at the pharmacy. If this continues to be >140/90, follow-up with your primary care provider for further blood pressure management/ medication titration. If you develop chest pain, shortness of breath, vision changes, the worst headache of your life- head straight to the ED or call 911. -Seek

## 2020-12-17 ENCOUNTER — Other Ambulatory Visit: Payer: Self-pay

## 2020-12-17 ENCOUNTER — Ambulatory Visit (INDEPENDENT_AMBULATORY_CARE_PROVIDER_SITE_OTHER): Payer: Medicare HMO | Admitting: Podiatry

## 2020-12-17 ENCOUNTER — Encounter: Payer: Self-pay | Admitting: Podiatry

## 2020-12-17 DIAGNOSIS — M79674 Pain in right toe(s): Secondary | ICD-10-CM

## 2020-12-17 DIAGNOSIS — B351 Tinea unguium: Secondary | ICD-10-CM | POA: Diagnosis not present

## 2020-12-17 DIAGNOSIS — M778 Other enthesopathies, not elsewhere classified: Secondary | ICD-10-CM

## 2020-12-17 DIAGNOSIS — M79675 Pain in left toe(s): Secondary | ICD-10-CM

## 2020-12-17 DIAGNOSIS — M7752 Other enthesopathy of left foot: Secondary | ICD-10-CM

## 2020-12-17 MED ORDER — MELOXICAM 15 MG PO TABS: 15.0000 mg | ORAL_TABLET | Freq: Every day | ORAL | 2 refills | Status: AC

## 2020-12-21 NOTE — Progress Notes (Signed)
Subjective:   Patient ID: Gary Stevenson, male   DOB: 79 y.o.   MRN: [id number]   HPI Patient presents with pain in the ankle region right over left with inflammation and also significant elongation nailbeds 1-5 both feet that are thick and he cannot cut.  States his feet in general hurt but the ankle seem to be the worst and its more towards his midfoot on the left   ROS      Objective:  Physical Exam  Neurovascular status intact with patient is found to have inflammation of the sinus tarsi right sinus tarsi left slightly distal and also is noted to have thick yellow brittle nailbeds 1-5 both feet     Assessment:  Mycotic nail infection bilateral along with inflammatory capsulitis ankle right sinus tarsi and left area sinus tarsi and distal     Plan:  H&P reviewed all conditions sterile prep injected the sinus tarsi bilateral 3 mg Kenalog 5 mg Xylocaine and for the nailbeds debrided nailbeds 1-5 both feet bilateral no iatrogenic bleeding continue conservative care reappoint as symptoms indicate  X-rays indicate moderate arthritis no indications of aggressive pathology     Patient presents with

## 2021-02-19 ENCOUNTER — Ambulatory Visit
Admission: EM | Admit: 2021-02-19 | Discharge: 2021-02-19 | Disposition: A | Discharge status: Home / Self Care | Payer: Medicare HMO | Attending: Family Medicine | Admitting: Family Medicine

## 2021-02-19 ENCOUNTER — Other Ambulatory Visit: Payer: Self-pay

## 2021-02-19 ENCOUNTER — Encounter: Payer: Self-pay | Admitting: Emergency Medicine

## 2021-02-19 ENCOUNTER — Ambulatory Visit (INDEPENDENT_AMBULATORY_CARE_PROVIDER_SITE_OTHER): Payer: Medicare HMO

## 2021-02-19 DIAGNOSIS — M79641 Pain in right hand: Secondary | ICD-10-CM

## 2021-02-19 DIAGNOSIS — M109 Gout, unspecified: Secondary | ICD-10-CM | POA: Diagnosis not present

## 2021-02-19 IMAGING — DX DG HAND COMPLETE 3+V*R*
3 series · 3 of 3 positions shown · non-contrast
Comparison: None.

CLINICAL DATA: Right hand pain and swelling for 2 days, no known
injury, initial encounter

EXAM:
RIGHT HAND - COMPLETE 3+ VIEW

[hand pa]
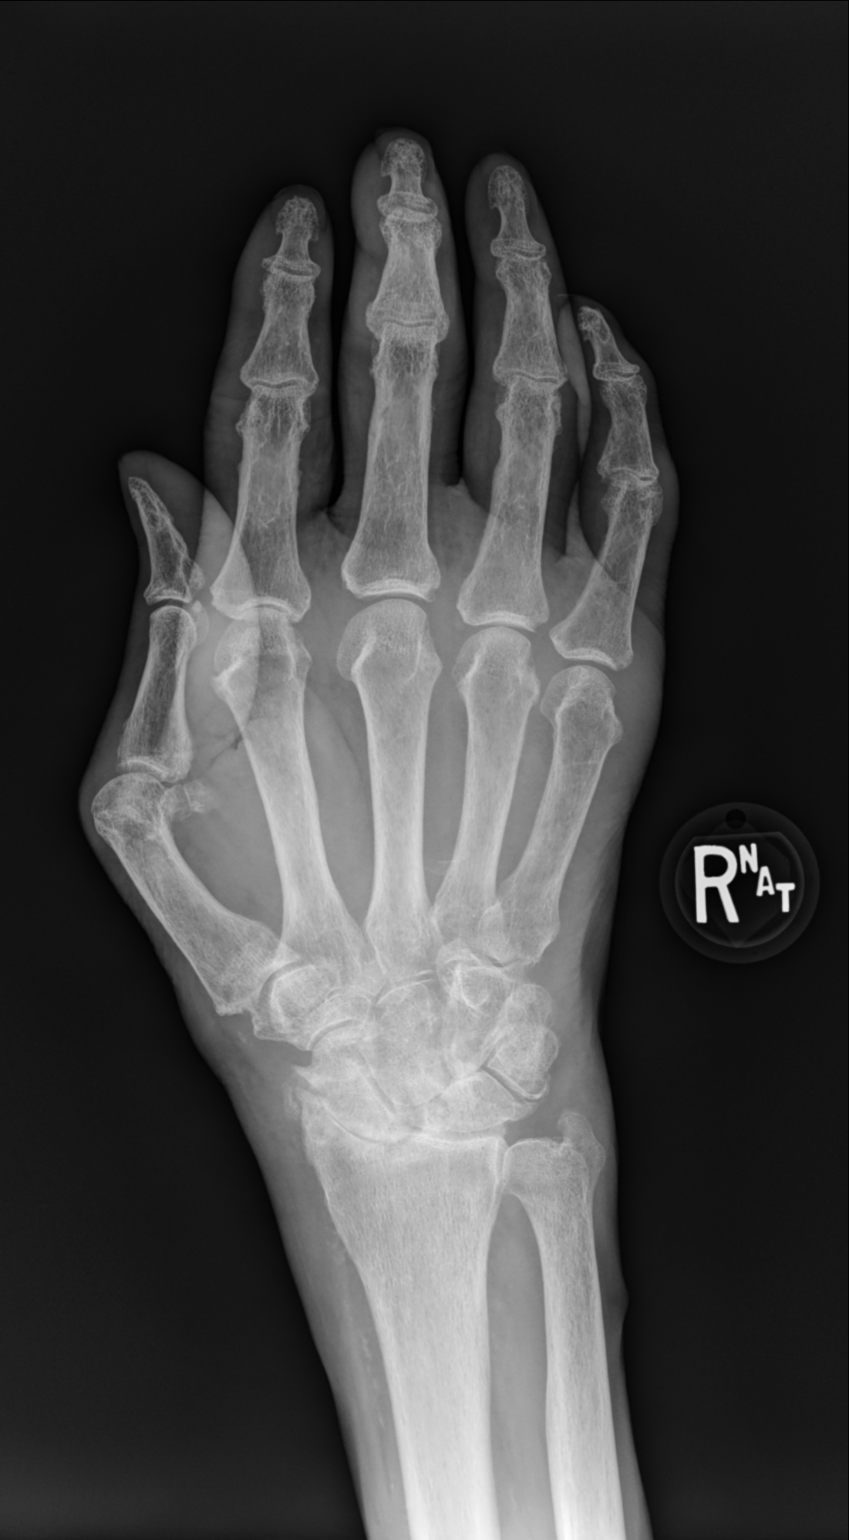

[hand mlo]
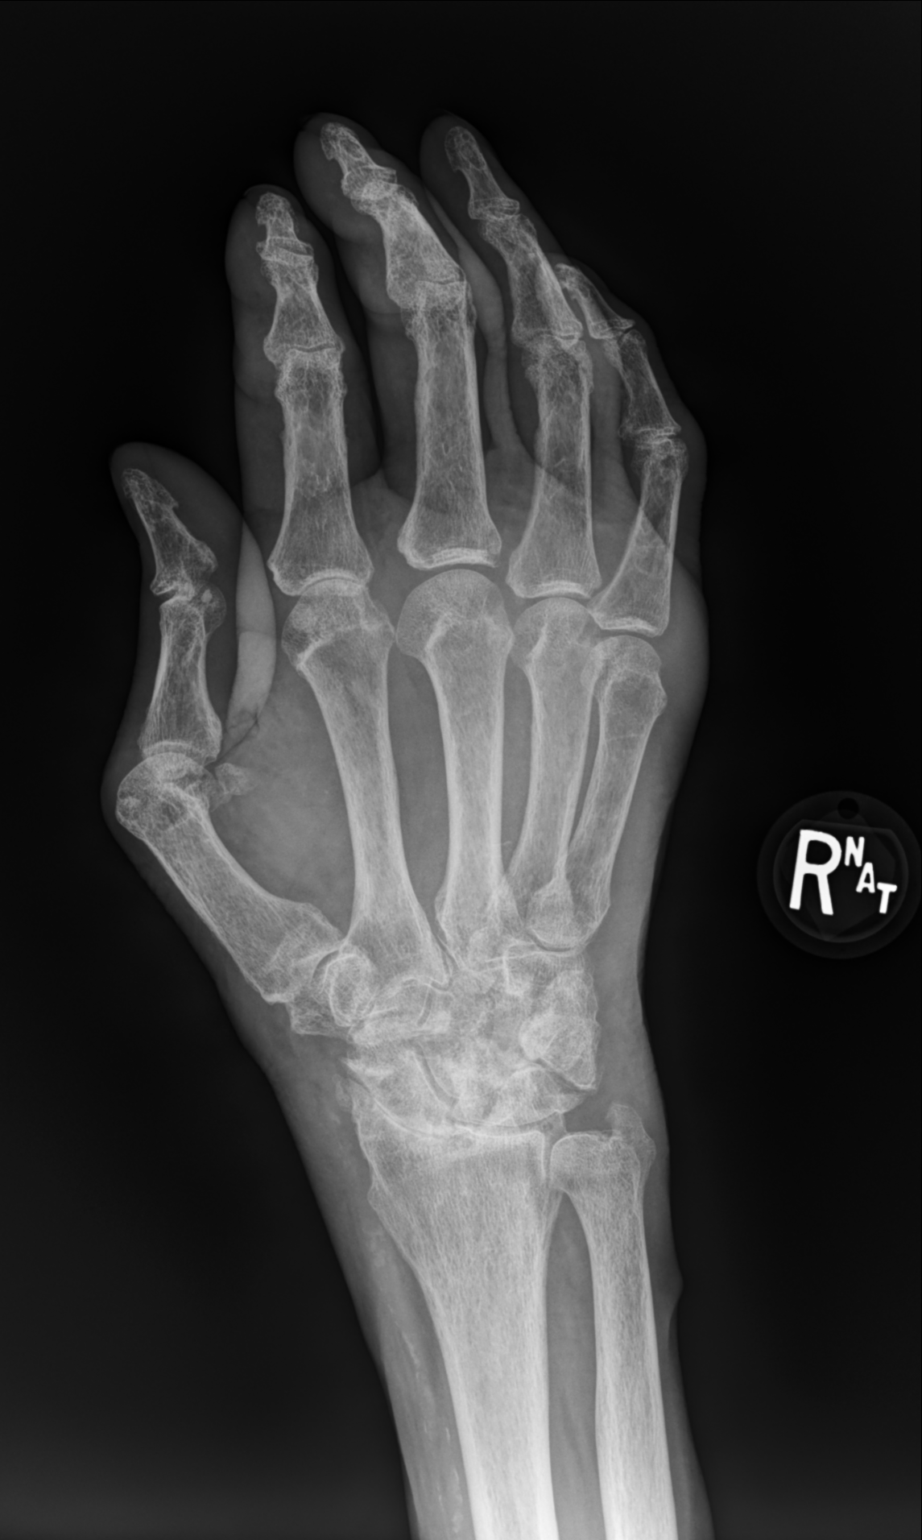

[hand lat]
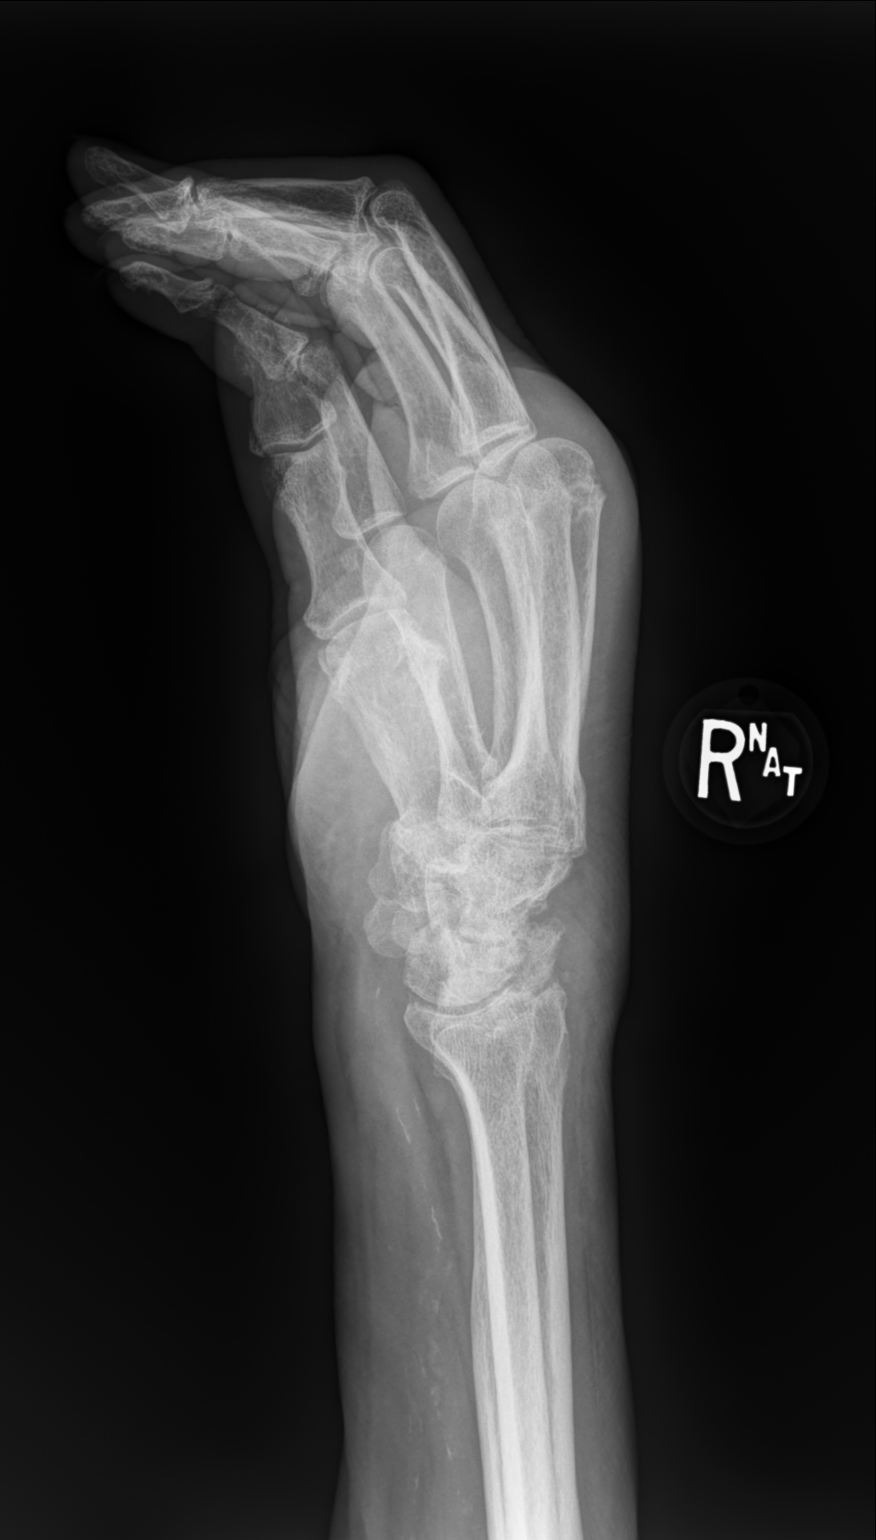

[3 of 3 positions shown; findings below may reference images not displayed]

FINDINGS: Considerable degenerative changes are noted in the radiocarpal joint
as well as within the carpal rows. First CMC degenerative changes
are noted. No acute fracture or dislocation is noted.
Interphalangeal degenerative changes are seen as well. Generalized
soft tissue swelling is noted.
IMPRESSION: Diffuse degenerative changes and soft tissue swelling without acute
bony abnormality.

## 2021-02-19 MED ORDER — DEXAMETHASONE SODIUM PHOSPHATE 10 MG/ML IJ SOLN
10.0000 mg | Freq: Once | INTRAMUSCULAR | Status: AC
  Administered 2021-02-19: 10 mg via INTRAMUSCULAR

## 2021-02-19 MED ORDER — INDOMETHACIN 50 MG PO CAPS: 50.0000 mg | ORAL_CAPSULE | Freq: Two times a day (BID) | ORAL | 0 refills | Status: AC | PRN

## 2021-02-19 NOTE — ED Triage Notes (Signed)
Pt here for right hand pain and swelling x 2 days; pt sts hx of same was from OA

## 2021-02-21 NOTE — ED Provider Notes (Signed)
[place]    CSN: [id number] Arrival date & time: 02/19/21  1831      History   Chief Complaint Chief Complaint  Patient presents with   Hand Pain    HPI Gary Stevenson is a 80 y.o. male.   Patient presenting today with family member for evaluation of right hand pain, swelling, redness for the past 2 days without any known injury.  He denies fever, chills, skin injuries or bites, numbness, tingling.  Range of motion is intact but extremely painful.  Has had gout in the past that has felt similar.  Also has significant known arthritis.  Does have a history of alcohol abuse and drinks frequently.  Took some ibuprofen with very mild relief.  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   Essential hypertension 03/17/2014   Anemia due to chronic blood loss 03/17/2014   Heme + stool 03/17/2014   Normocytic anemia 03/17/2014   COPD UNSPECIFIED 01/03/2009   DYSPNEA 01/03/2009   SWELLING/MASS/LUMP IN CHEST 01/03/2009    Past Surgical History:  Procedure Laterality Date   BRAIN SURGERY     COLONOSCOPY N/A 03/18/2014   Procedure: COLONOSCOPY;  Surgeon: Juanita Craver, MD;  Location: [facility];  Service: Endoscopy;  Laterality: N/A;   ESOPHAGOGASTRODUODENOSCOPY N/A 03/18/2014   Procedure: ESOPHAGOGASTRODUODENOSCOPY (EGD);  Surgeon: Juanita Craver, MD;  Location: [facility] ENDOSCOPY;  Service: Endoscopy;  Laterality: N/A;   LUNG SURGERY       Home Medications    Prior to Admission medications   Medication Sig Start Date End Date Taking? Authorizing Provider  indomethacin (INDOCIN) 50 MG capsule Take 1 capsule (50 mg total) by mouth 2  (two) times daily as needed. 02/19/21  Yes Volney American, PA-C  meloxicam (MOBIC) 15 MG tablet Take 1 tablet (15 mg total) by mouth daily. Patient not taking: Reported on 02/19/2021 12/17/20   Wallene Huh, DPM  prednisoLONE acetate (PRED FORTE) 1 % ophthalmic suspension SMARTSIG:1 Drop(s) Right Eye Morning-Night Patient not taking: Reported on 02/19/2021 10/09/20   [provider]    Family History History reviewed. No pertinent family history.  Social History Social History   Tobacco Use   Smoking status: Former   Smokeless tobacco: Never  Substance Use Topics   Alcohol use: Yes    Comment: "I drink whenever I have money"   Drug use: No     Allergies   Patient has no known allergies.   Review of Systems Review of Systems Per HPI  Physical Exam Triage Vital Signs ED Triage Vitals  Enc Vitals Group     BP 02/19/21 1913 140/84     Pulse Rate 02/19/21 1913 92     Resp 02/19/21 1913 18     Temp 02/19/21 1913 98.7 F (37.1 C)     Temp Source 02/19/21 1913 Oral     SpO2 02/19/21 1913 98 %     Weight --      Height --      Head Circumference --      Peak Flow --      Pain Score 02/19/21 1914  8     Pain Loc --      Pain Edu? --      Excl. in [place]? --    No data found.  Updated Vital Signs BP 140/84 (BP Location: Left Arm)   Pulse 92   Temp 98.7 F (37.1 C) (Oral)   Resp 18   SpO2 98%   Visual Acuity Right Eye Distance:   Left Eye Distance:   Bilateral Distance:    Right Eye Near:   Left Eye Near:    Bilateral Near:     Physical Exam Vitals and nursing note reviewed.  Constitutional:      Appearance: Normal appearance.  HENT:     Head: Atraumatic.     Mouth/Throat:     Mouth: Mucous membranes are moist.     Pharynx: Oropharynx is clear.  Eyes:     Extraocular Movements: Extraocular movements intact.     Conjunctiva/sclera: Conjunctivae normal.  Cardiovascular:     Rate and Rhythm: Normal rate and regular rhythm.  Pulmonary:      Effort: Pulmonary effort is normal.     Breath sounds: Normal breath sounds.  Abdominal:     General: Bowel sounds are normal. There is no distension.     Palpations: Abdomen is soft.     Tenderness: There is no abdominal tenderness. There is no guarding.  Musculoskeletal:        General: Swelling and tenderness present. No deformity or signs of injury. Normal range of motion.     Cervical back: Normal range of motion and neck supple.     Comments: Significant edema, erythema, tenderness to palpation across right MCPs diffusely.  Range of motion intact but very painful  Skin:    General: Skin is warm and dry.     Findings: Erythema present. No rash.  Neurological:     General: No focal deficit present.     Mental Status: He is oriented to person, place, and time.     Motor: No weakness.     Gait: Gait normal.     Comments: Right upper extremity neurovascularly intact  Psychiatric:        Mood and Affect: Mood normal.        Thought Content: Thought content normal.        Judgment: Judgment normal.   UC Treatments / Results  Labs (all labs ordered are listed, but only abnormal results are displayed) Labs Reviewed - No data to display  EKG   Radiology DG Hand Complete Right  Result Date: 02/19/2021 CLINICAL DATA:  Right hand pain and swelling for 2 days, no known injury, initial encounter EXAM: RIGHT HAND - COMPLETE 3+ VIEW COMPARISON:  None. FINDINGS: Considerable degenerative changes are noted in the radiocarpal joint as well as within the carpal rows. First CMC degenerative changes are noted. No acute fracture or dislocation is noted. Interphalangeal degenerative changes are seen as well. Generalized soft tissue swelling is noted. IMPRESSION: Diffuse degenerative changes and soft tissue swelling without acute bony abnormality. Electronically Signed   By: Inez Catalina M.D.   On: 02/19/2021 19:40    Procedures Procedures (including critical care time)  Medications Ordered in  UC Medications  dexamethasone (DECADRON) injection 10 mg (10 mg Intramuscular Given 02/19/21 1953)    Initial Impression / Assessment and Plan / UC Course  I have reviewed the triage vital signs and the nursing notes.  Pertinent labs & imaging results that were available during my care of the patient  were reviewed by me and considered in my medical decision making (see chart for details).     Right hand x-ray negative for bony injury.  Suspect gout based on exam findings.  IM Decadron given in clinic, sent home with short supply of indomethacin for as needed use only.  Discussed supportive home care, diet changes, tart cherry supplements.  Follow-up with PCP for recheck and ongoing management.  Final Clinical Impressions(s) / UC Diagnoses   Final diagnoses:  Acute gout of right hand, unspecified cause   Discharge Instructions   None    ED Prescriptions     Medication Sig Dispense Auth. Provider   indomethacin (INDOCIN) 50 MG capsule Take 1 capsule (50 mg total) by mouth 2 (two) times daily as needed. 10 capsule Volney American, [place]      PDMP not reviewed this encounter.   Volney American, [place] 02/21/21 1035

## 2021-05-06 ENCOUNTER — Encounter: Payer: Self-pay | Admitting: Emergency Medicine

## 2021-05-06 ENCOUNTER — Ambulatory Visit (INDEPENDENT_AMBULATORY_CARE_PROVIDER_SITE_OTHER): Payer: Medicare HMO

## 2021-05-06 ENCOUNTER — Other Ambulatory Visit: Payer: Self-pay

## 2021-05-06 ENCOUNTER — Emergency Department (HOSPITAL_COMMUNITY): Payer: Medicare HMO

## 2021-05-06 ENCOUNTER — Ambulatory Visit
Admission: EM | Admit: 2021-05-06 | Discharge: 2021-05-06 | Disposition: A | Discharge status: Other Acute Inpatient Hospital | Payer: Medicare HMO | Attending: Internal Medicine | Admitting: Internal Medicine

## 2021-05-06 ENCOUNTER — Emergency Department (HOSPITAL_COMMUNITY)
Admission: EM | Admit: 2021-05-06 | Discharge: 2021-05-06 | Disposition: A | Discharge status: Home / Self Care | Payer: Medicare HMO | Attending: Emergency Medicine | Admitting: Emergency Medicine

## 2021-05-06 DIAGNOSIS — Z87891 Personal history of nicotine dependence: Secondary | ICD-10-CM | POA: Diagnosis not present

## 2021-05-06 DIAGNOSIS — R030 Elevated blood-pressure reading, without diagnosis of hypertension: Secondary | ICD-10-CM | POA: Diagnosis not present

## 2021-05-06 DIAGNOSIS — S72009A Fracture of unspecified part of neck of unspecified femur, initial encounter for closed fracture: Secondary | ICD-10-CM | POA: Diagnosis not present

## 2021-05-06 DIAGNOSIS — W19XXXA Unspecified fall, initial encounter: Secondary | ICD-10-CM | POA: Insufficient documentation

## 2021-05-06 DIAGNOSIS — S12101A Unspecified nondisplaced fracture of second cervical vertebra, initial encounter for closed fracture: Secondary | ICD-10-CM | POA: Diagnosis not present

## 2021-05-06 DIAGNOSIS — R531 Weakness: Secondary | ICD-10-CM | POA: Insufficient documentation

## 2021-05-06 DIAGNOSIS — M25551 Pain in right hip: Secondary | ICD-10-CM | POA: Diagnosis not present

## 2021-05-06 DIAGNOSIS — S32050A Wedge compression fracture of fifth lumbar vertebra, initial encounter for closed fracture: Secondary | ICD-10-CM | POA: Diagnosis not present

## 2021-05-06 DIAGNOSIS — Z85118 Personal history of other malignant neoplasm of bronchus and lung: Secondary | ICD-10-CM | POA: Insufficient documentation

## 2021-05-06 DIAGNOSIS — I10 Essential (primary) hypertension: Secondary | ICD-10-CM | POA: Insufficient documentation

## 2021-05-06 DIAGNOSIS — Z79899 Other long term (current) drug therapy: Secondary | ICD-10-CM | POA: Diagnosis not present

## 2021-05-06 DIAGNOSIS — M546 Pain in thoracic spine: Secondary | ICD-10-CM | POA: Diagnosis not present

## 2021-05-06 DIAGNOSIS — M25552 Pain in left hip: Secondary | ICD-10-CM | POA: Diagnosis not present

## 2021-05-06 DIAGNOSIS — S12110A Anterior displaced Type II dens fracture, initial encounter for closed fracture: Secondary | ICD-10-CM | POA: Diagnosis not present

## 2021-05-06 DIAGNOSIS — S14109A Unspecified injury at unspecified level of cervical spinal cord, initial encounter: Secondary | ICD-10-CM | POA: Diagnosis present

## 2021-05-06 DIAGNOSIS — M25572 Pain in left ankle and joints of left foot: Secondary | ICD-10-CM | POA: Diagnosis not present

## 2021-05-06 DIAGNOSIS — S32040A Wedge compression fracture of fourth lumbar vertebra, initial encounter for closed fracture: Secondary | ICD-10-CM | POA: Diagnosis not present

## 2021-05-06 DIAGNOSIS — J449 Chronic obstructive pulmonary disease, unspecified: Secondary | ICD-10-CM | POA: Insufficient documentation

## 2021-05-06 DIAGNOSIS — M542 Cervicalgia: Secondary | ICD-10-CM | POA: Diagnosis not present

## 2021-05-06 DIAGNOSIS — Z20822 Contact with and (suspected) exposure to covid-19: Secondary | ICD-10-CM | POA: Diagnosis not present

## 2021-05-06 LAB — COMPREHENSIVE METABOLIC PANEL
ALT: 12 U/L (ref 0–44)
AST: 16 U/L (ref 15–41)
Albumin: 2.6 g/dL — ABNORMAL LOW (ref 3.5–5.0)
Alkaline Phosphatase: 106 U/L (ref 38–126)
Anion gap: 10 (ref 5–15)
BUN: 43 mg/dL — ABNORMAL HIGH (ref 8–23)
CO2: 22 mmol/L (ref 22–32)
Calcium: 8.8 mg/dL — ABNORMAL LOW (ref 8.9–10.3)
Chloride: 102 mmol/L (ref 98–111)
Creatinine, Ser: 1.61 mg/dL — ABNORMAL HIGH (ref 0.61–1.24)
GFR, Estimated: 43 mL/min — ABNORMAL LOW (ref >60)
Glucose, Bld: 106 mg/dL — ABNORMAL HIGH (ref 70–99)
Potassium: 4.5 mmol/L (ref 3.5–5.1)
Sodium: 134 mmol/L — ABNORMAL LOW (ref 135–145)
Total Bilirubin: 0.6 mg/dL (ref 0.3–1.2)
Total Protein: 7.8 g/dL (ref 6.5–8.1)

## 2021-05-06 LAB — URINALYSIS, ROUTINE W REFLEX MICROSCOPIC
Bacteria, UA: NONE SEEN
Bilirubin Urine: NEGATIVE
Glucose, UA: NEGATIVE mg/dL
Ketones, ur: NEGATIVE mg/dL
Nitrite: NEGATIVE
Protein, ur: 100 mg/dL — AB
Specific Gravity, Urine: 1.016 (ref 1.005–1.030)
pH: 5 (ref 5.0–8.0)

## 2021-05-06 LAB — CBC
HCT: 33.5 % — ABNORMAL LOW (ref 39.0–52.0)
Hemoglobin: 10.6 g/dL — ABNORMAL LOW (ref 13.0–17.0)
MCH: 29.1 pg (ref 26.0–34.0)
MCHC: 31.6 g/dL (ref 30.0–36.0)
MCV: 92 fL (ref 80.0–100.0)
Platelets: 407 10*3/uL — ABNORMAL HIGH (ref 150–400)
RBC: 3.64 MIL/uL — ABNORMAL LOW (ref 4.22–5.81)
RDW: 18.6 % — ABNORMAL HIGH (ref 11.5–15.5)
WBC: 6.5 10*3/uL (ref 4.0–10.5)
nRBC: 0 % (ref 0.0–0.2)

## 2021-05-06 LAB — ETHANOL: Alcohol, Ethyl (B): <10 mg/dL (ref <10)

## 2021-05-06 LAB — PROTIME-INR
INR: 1.1 (ref 0.8–1.2)
Prothrombin Time: 14.1 seconds (ref 11.4–15.2)

## 2021-05-06 LAB — RESP PANEL BY RT-PCR (FLU A&B, COVID) ARPGX2
Influenza A by PCR: NEGATIVE
Influenza B by PCR: NEGATIVE
SARS Coronavirus 2 by RT PCR: NEGATIVE

## 2021-05-06 LAB — I-STAT CHEM 8, ED
BUN: 38 mg/dL — ABNORMAL HIGH (ref 8–23)
Calcium, Ion: 1.18 mmol/L (ref 1.15–1.40)
Chloride: 107 mmol/L (ref 98–111)
Creatinine, Ser: 1.9 mg/dL — ABNORMAL HIGH (ref 0.61–1.24)
Glucose, Bld: 102 mg/dL — ABNORMAL HIGH (ref 70–99)
HCT: 33 % — ABNORMAL LOW (ref 39.0–52.0)
Hemoglobin: 11.2 g/dL — ABNORMAL LOW (ref 13.0–17.0)
Potassium: 4.5 mmol/L (ref 3.5–5.1)
Sodium: 139 mmol/L (ref 135–145)
TCO2: 22 mmol/L (ref 22–32)

## 2021-05-06 LAB — LACTIC ACID, PLASMA: Lactic Acid, Venous: 0.9 mmol/L (ref 0.5–1.9)

## 2021-05-06 IMAGING — CT CT L SPINE W/O CM
3 series · 11 of 33 positions shown, 13 images · non-contrast
Comparison: None.

CLINICAL DATA: Fell 2 weeks ago.  Back pain.  Acute C2 fracture.

EXAM:
CT LUMBAR SPINE WITHOUT CONTRAST
TECHNIQUE: Multidetector CT imaging of the lumbar spine was performed without
intravenous contrast administration. Multiplanar CT image
reconstructions were also generated.

[Series 4: l-spine 2.0 st · axial · 0.47mm/px · z∈[-638,-466]mm · 3 of 141 slices shown, 4 images]
[im 33/141  soft-tissue]
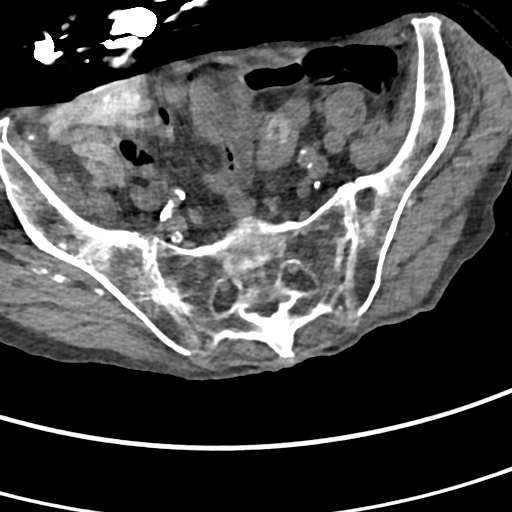
[im 33/141  bone]
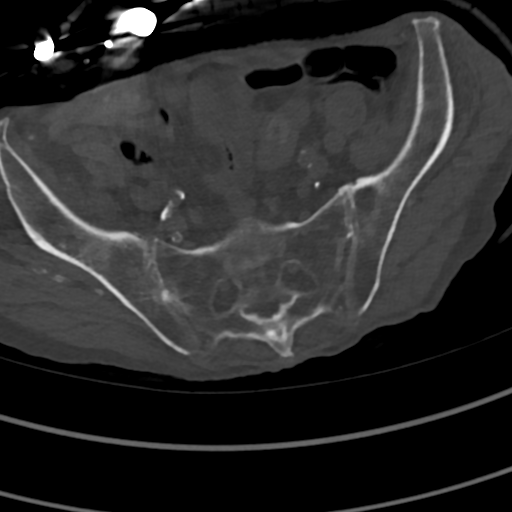
[im 76/141  bone]
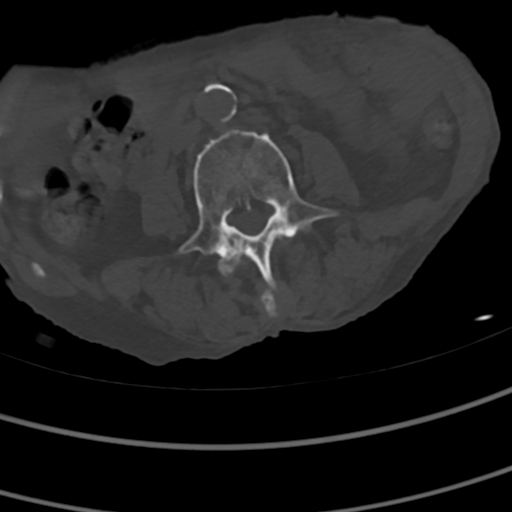
[im 119/141  bone]
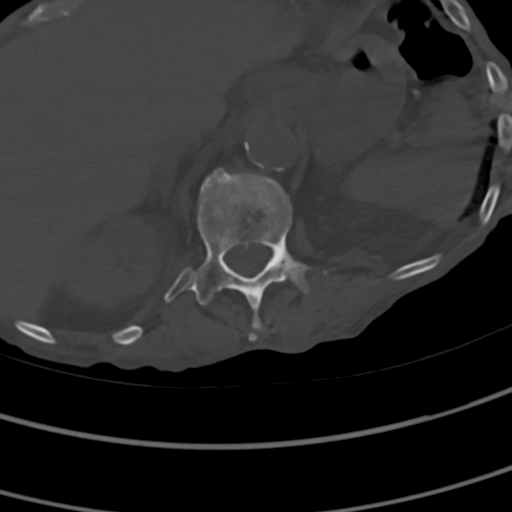

[Series 7: l-spine 2.0 cor · coronal · 0.41mm/px · 3 of 73 slices shown]
[im 15/73  bone]
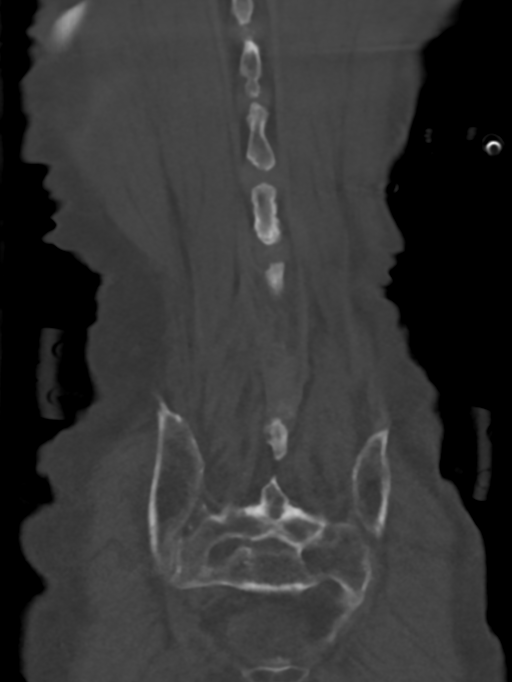
[im 29/73  bone]
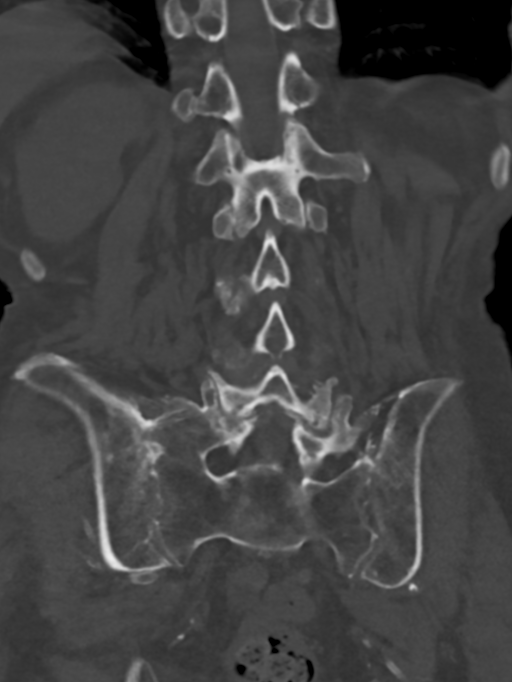
[im 44/73  bone]
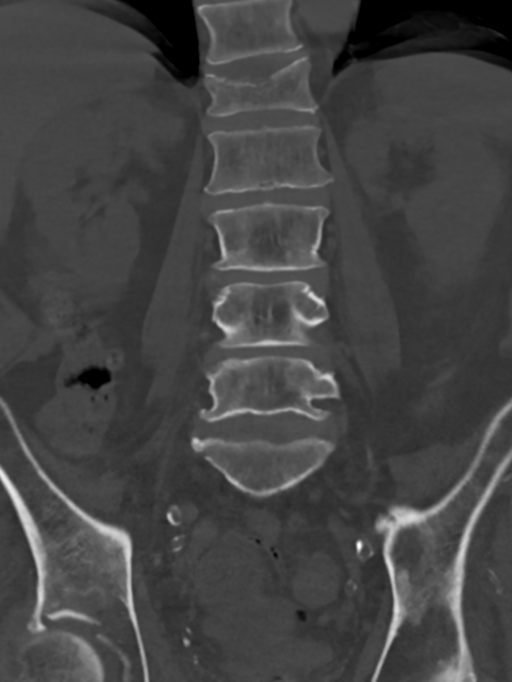

[Series 8: l-spine 2.0 sag · sagittal · 0.28mm/px · 5 of 81 slices shown, 6 images]
[im 27/81  bone]
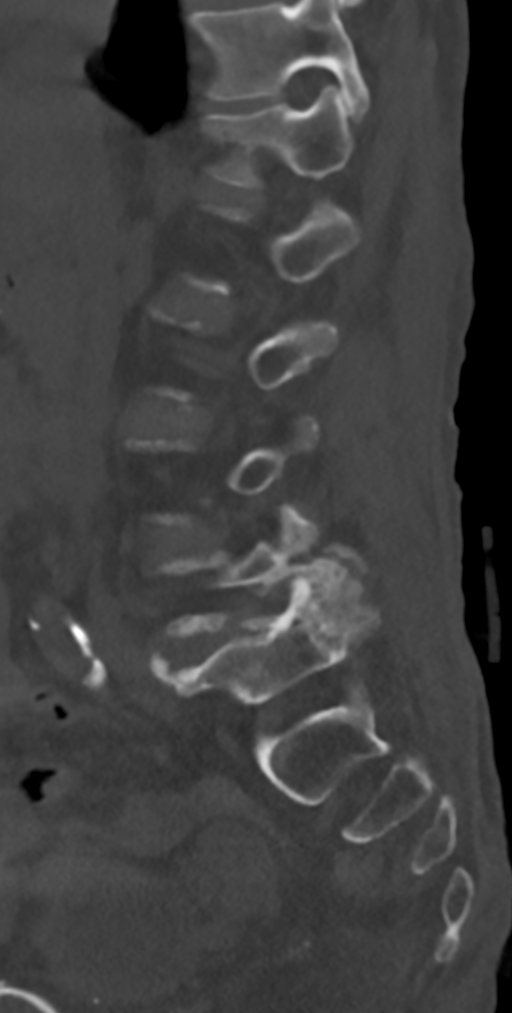
[im 34/81  bone]
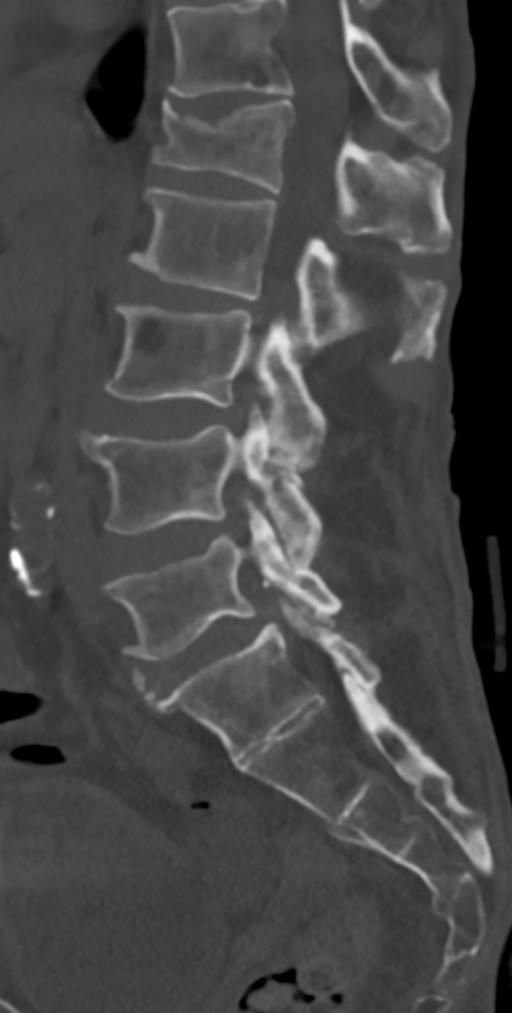
[im 41/81  soft-tissue]
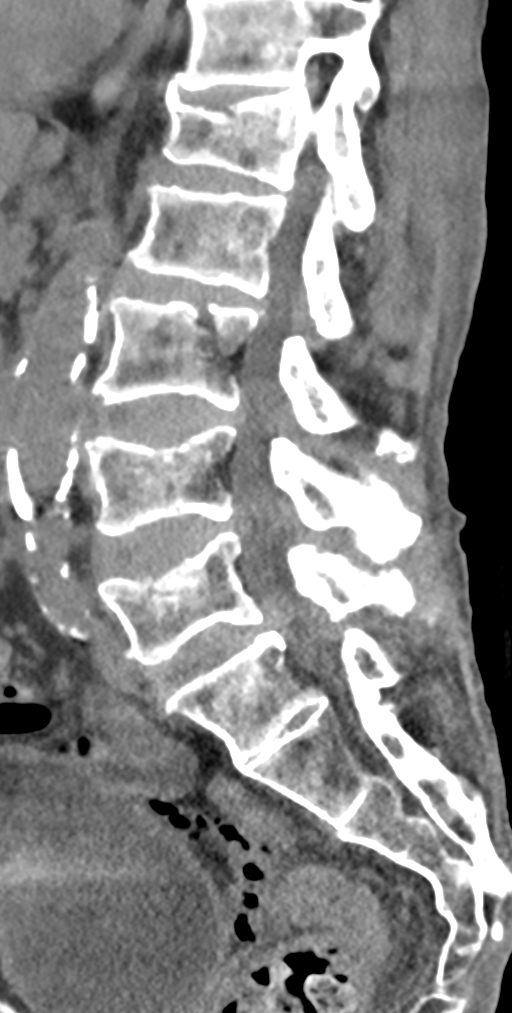
[im 41/81  bone]
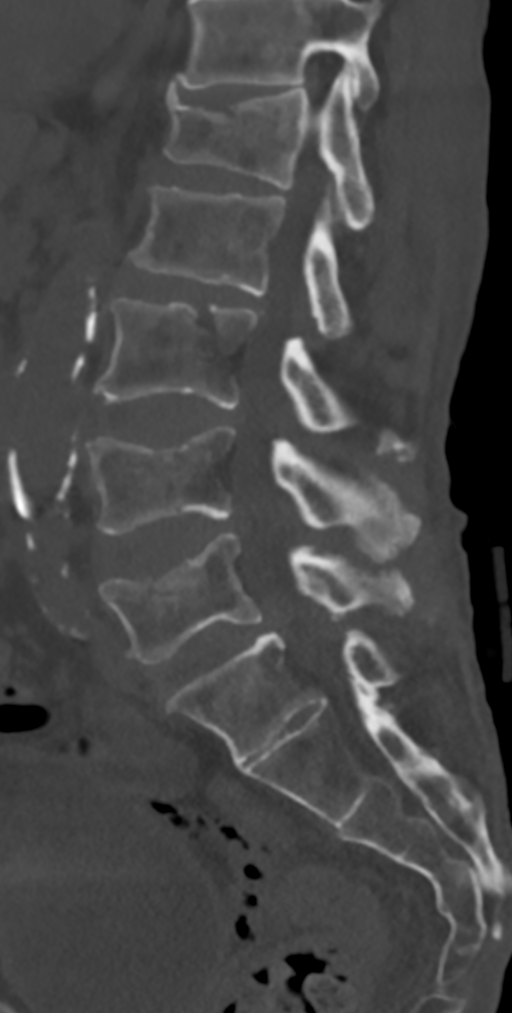
[im 47/81  bone]
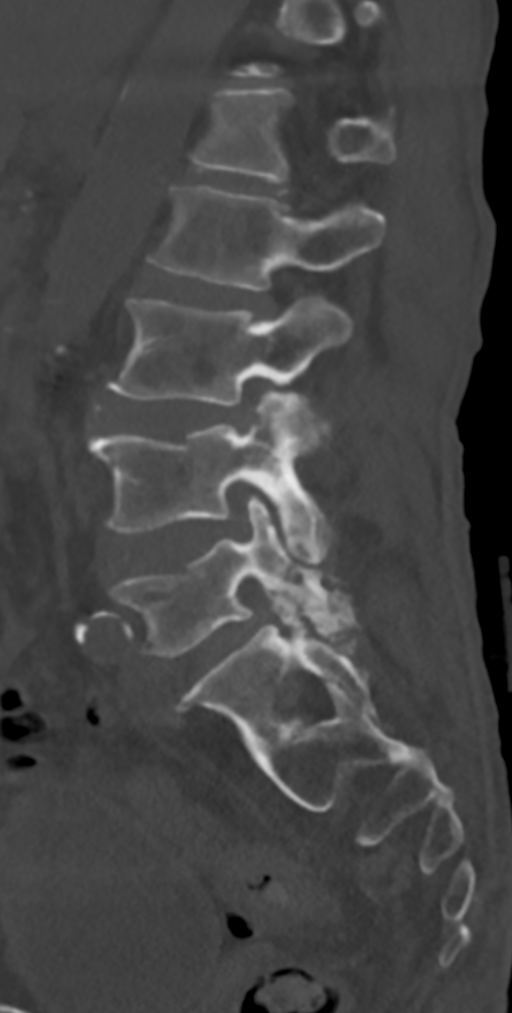
[im 54/81  bone]
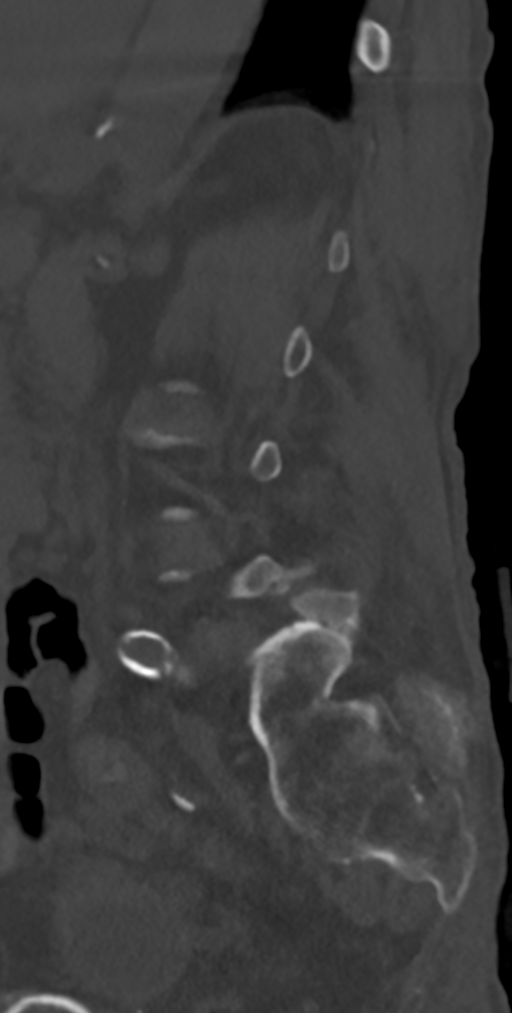

[11 of 33 positions shown; findings below may reference images not displayed]

FINDINGS: Segmentation: 5 lumbar type vertebral bodies assumed.

Alignment: No malalignment.

Vertebrae: Old minor anterior compression fracture at L1 with loss
of height of 25%. Suspicion of a minimal acute superior endplate
fracture at L4. Acute superior endplate compression fracture at L5
with loss of height of 10%.

Paraspinal and other soft tissues: Negative other than aortic
atherosclerosis.

Disc levels: Mild multifactorial spinal stenosis at L3-4. Moderate
multifactorial spinal stenosis at L4-5 and L5-S1 due to shallow disc
protrusions in combination with facet and ligamentous hypertrophy.
IMPRESSION: Minimal acute superior endplate fractures at L4 and L5 with loss of
height no more than 10%. No retropulsed bone.

Moderate multifactorial stenosis at L4-5 and L5-S1.

## 2021-05-06 IMAGING — MR MR LUMBAR SPINE W/O CM
4 of 5 series · 19 of 48 positions shown · non-contrast
Comparison: CT same day

CLINICAL DATA: Trauma.  Abnormal neurological exam.  Abnormal CT.

EXAM:
MRI LUMBAR SPINE WITHOUT CONTRAST
TECHNIQUE: Multiplanar, multisequence MR imaging of the lumbar spine was
performed. No intravenous contrast was administered.

[Series 2: T2 · sagittal · 4.0mm · 0.55mm/px · 6 of 15 slices shown (1 of 2)]
[im 1/15]
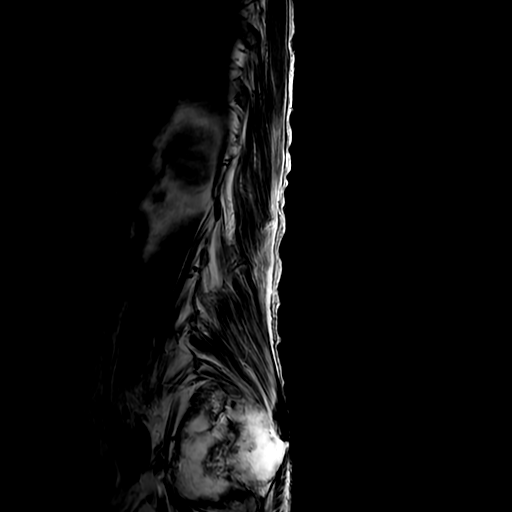
[im 3/15]
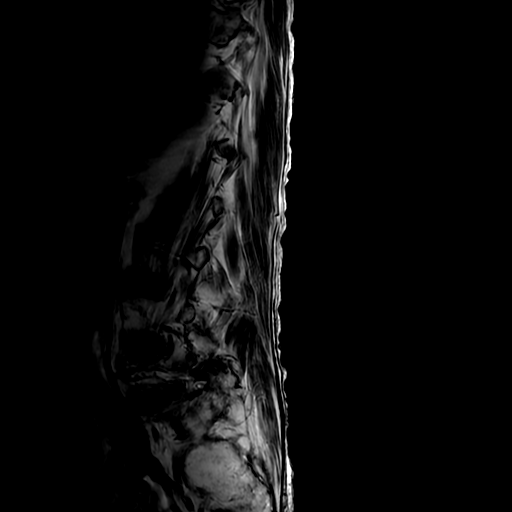
[im 6/15]
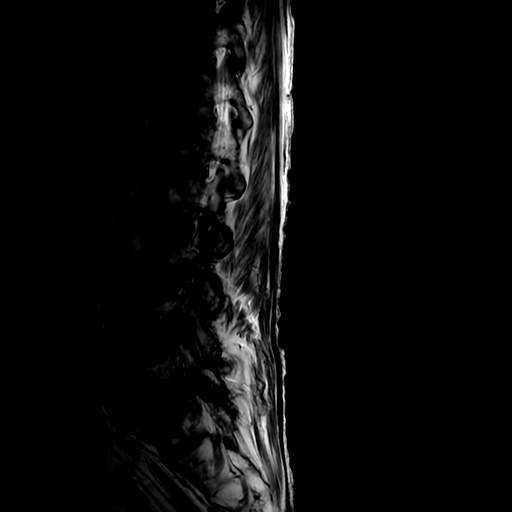
[im 9/15]
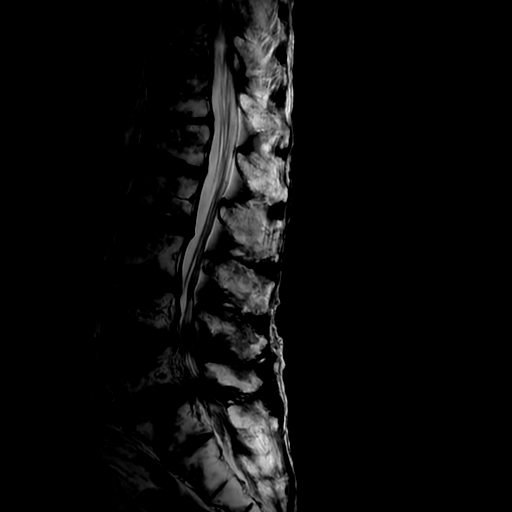
[im 12/15]
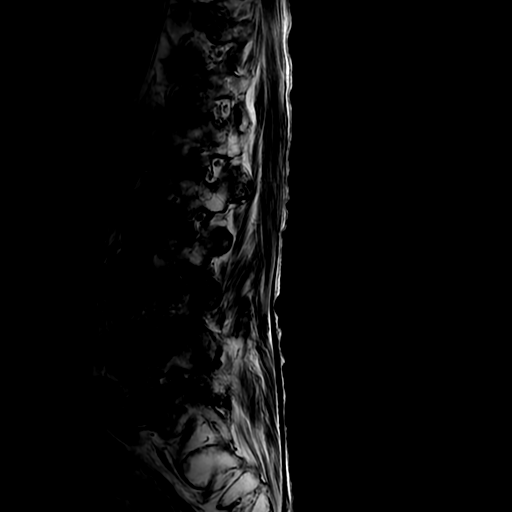
[im 15/15]
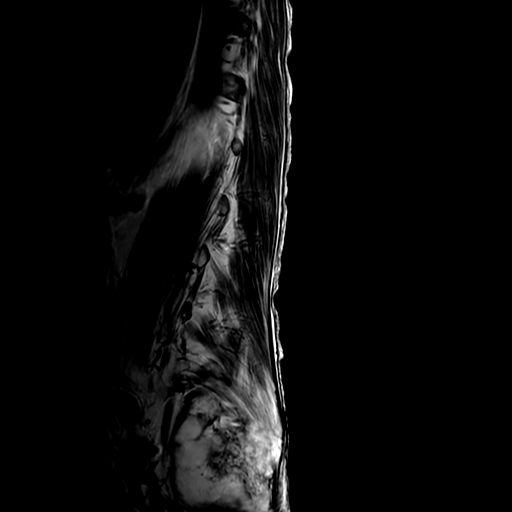

[Series 4: T1 · sagittal · 4.0mm · 0.47mm/px · 3 of 15 slices shown (1 of 2)]
[im 3/15]
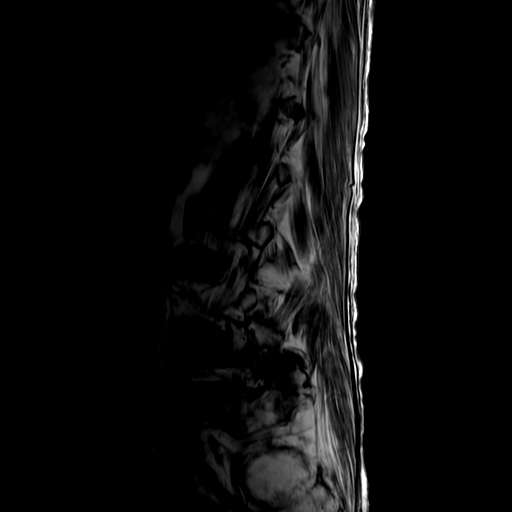
[im 9/15]
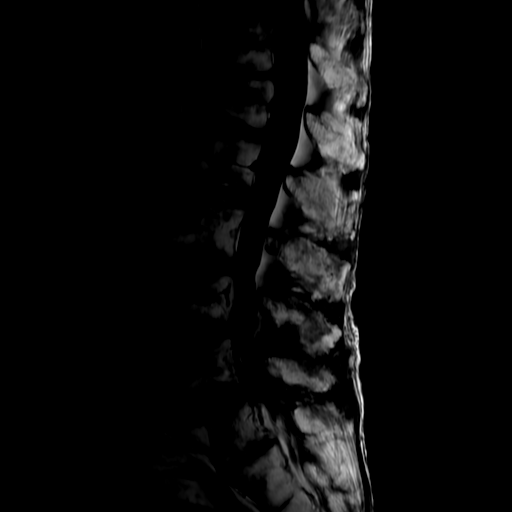
[im 15/15]
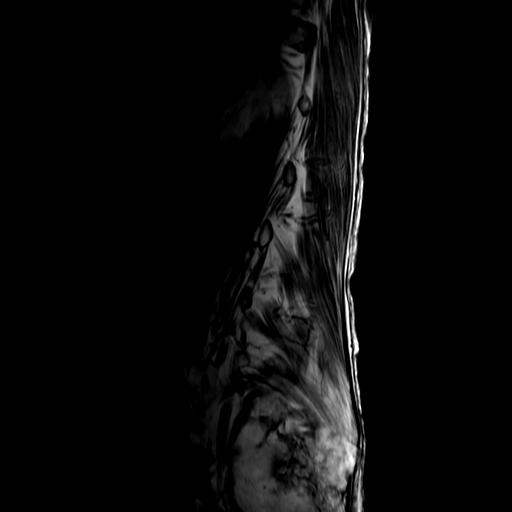

[Series 5: T2 · axial · 4.0mm · 0.39mm/px · z∈[+48,+213]mm · 7 of 40 slices shown (2 of 2)]
[im 1/40]
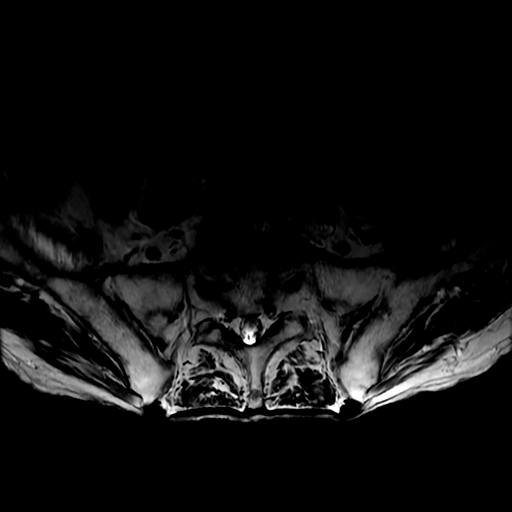
[im 6/40]
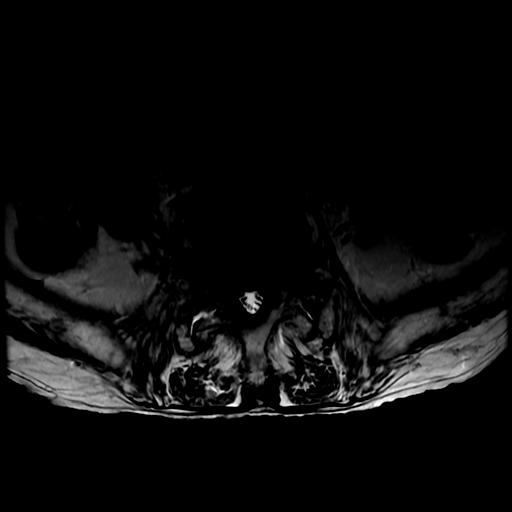
[im 12/40]
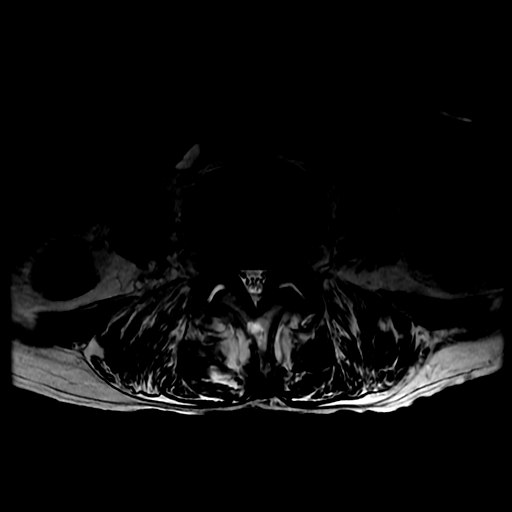
[im 17/40]
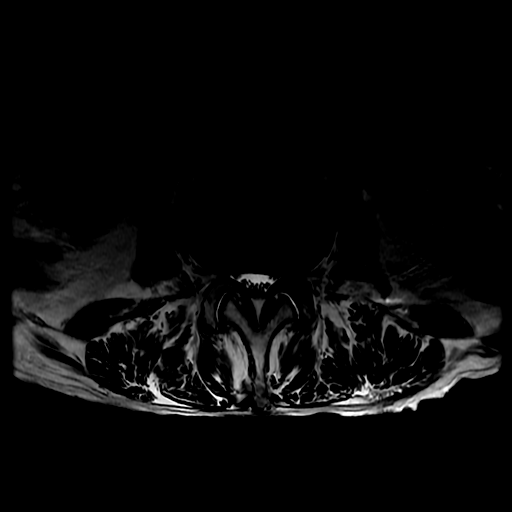
[im 20/40]
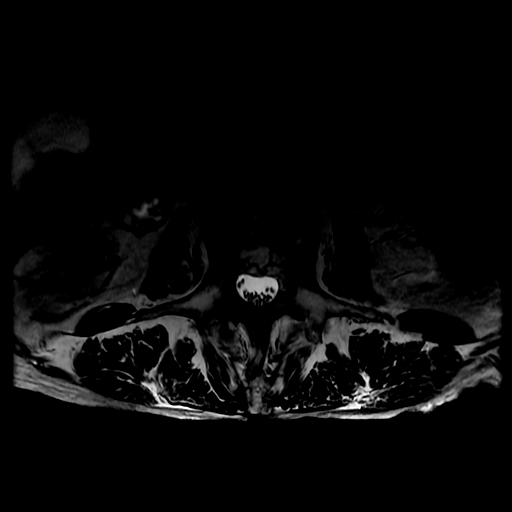
[im 23/40]
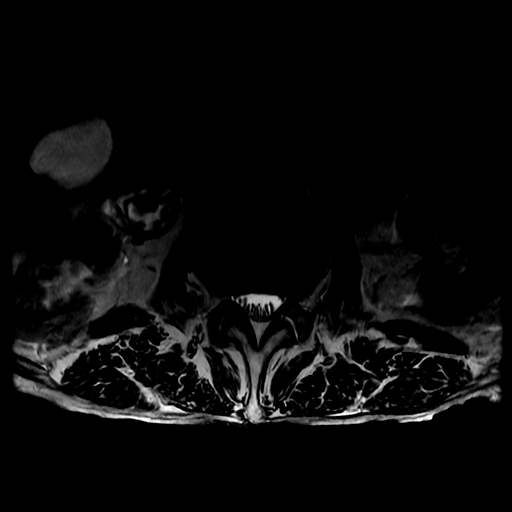
[im 34/40]
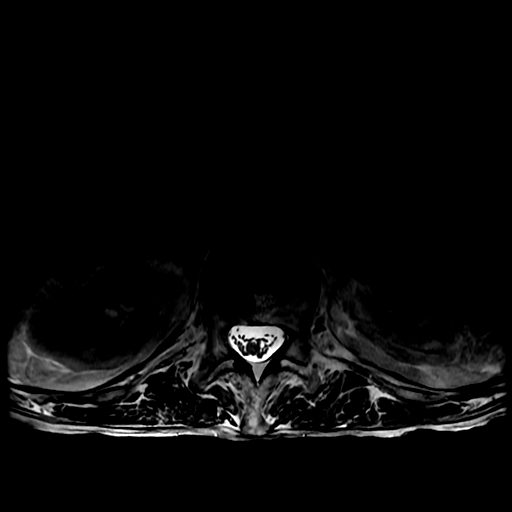

[Series 6: T1 · axial · 4.0mm · 0.39mm/px · z∈[+73,+213]mm · 3 of 39 slices shown (2 of 2)]
[im 6/39]
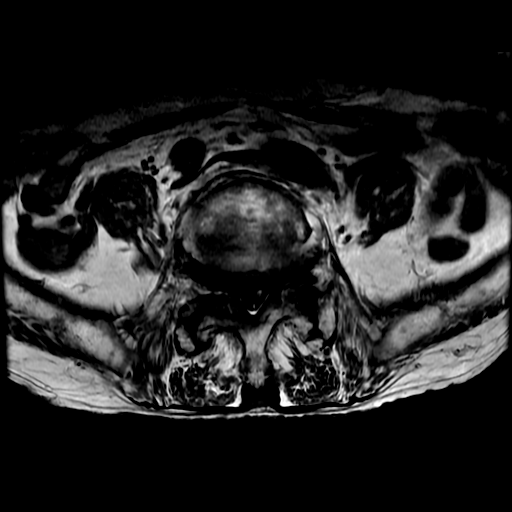
[im 20/39]
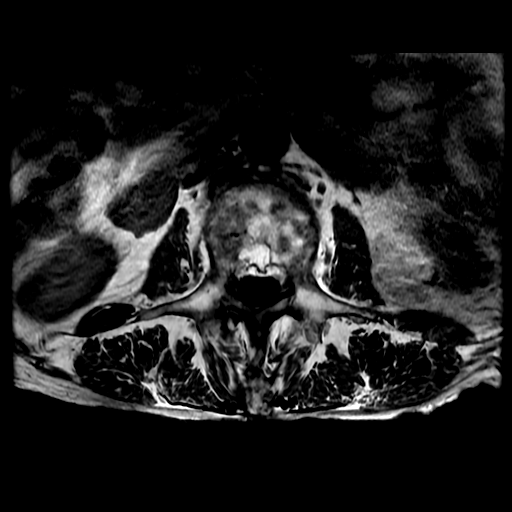
[im 33/39]
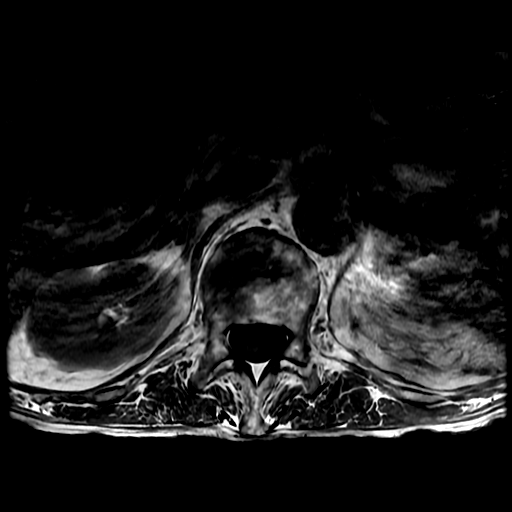

[19 of 48 positions shown; findings below may reference images not displayed]

FINDINGS: Segmentation: 5 lumbar type vertebral bodies as numbered previously.

Alignment:  No malalignment.

Vertebrae: The study confirms acute compression fractures at L4 and
L5 with loss of height of only about 10%. Old healed compression
deformity at L1.

Conus medullaris and cauda equina: Conus extends to the L1 level.
Conus and cauda equina appear normal.

Paraspinal and other soft tissues: Negative

Disc levels:

Minimal non-compressive disc bulges at L2-3 and above.

L3-4: Bulging of the disc. Mild facet and ligamentous hypertrophy.
Mild narrowing of the lateral recesses but no definite neural
compression.

L4-5: Bulging of the disc. Facet and ligamentous hypertrophy.
Moderate multifactorial stenosis.

L5-S1: Bulging of the disc. Facet and ligamentous hypertrophy.
Stenosis of the lateral recesses and neural foramina.
IMPRESSION: Confirmation of acute compression fractures at L4 and L5, with loss
of height of only about 10%.

Lateral recess and foraminal stenosis at L4-5 and L5-S1 that appear
chronic but could be symptomatic. Facet arthropathy at those levels
could also be painful.

## 2021-05-06 IMAGING — DX DG HIP (WITH OR WITHOUT PELVIS) 2-3V*R*
3 series · 3 of 3 positions shown · non-contrast
Comparison: None.

CLINICAL DATA: Fall on sidewalk 2 weeks ago, pain

EXAM:
DG HIP (WITH OR WITHOUT PELVIS) 2-3V RIGHT

[pelvis ap]
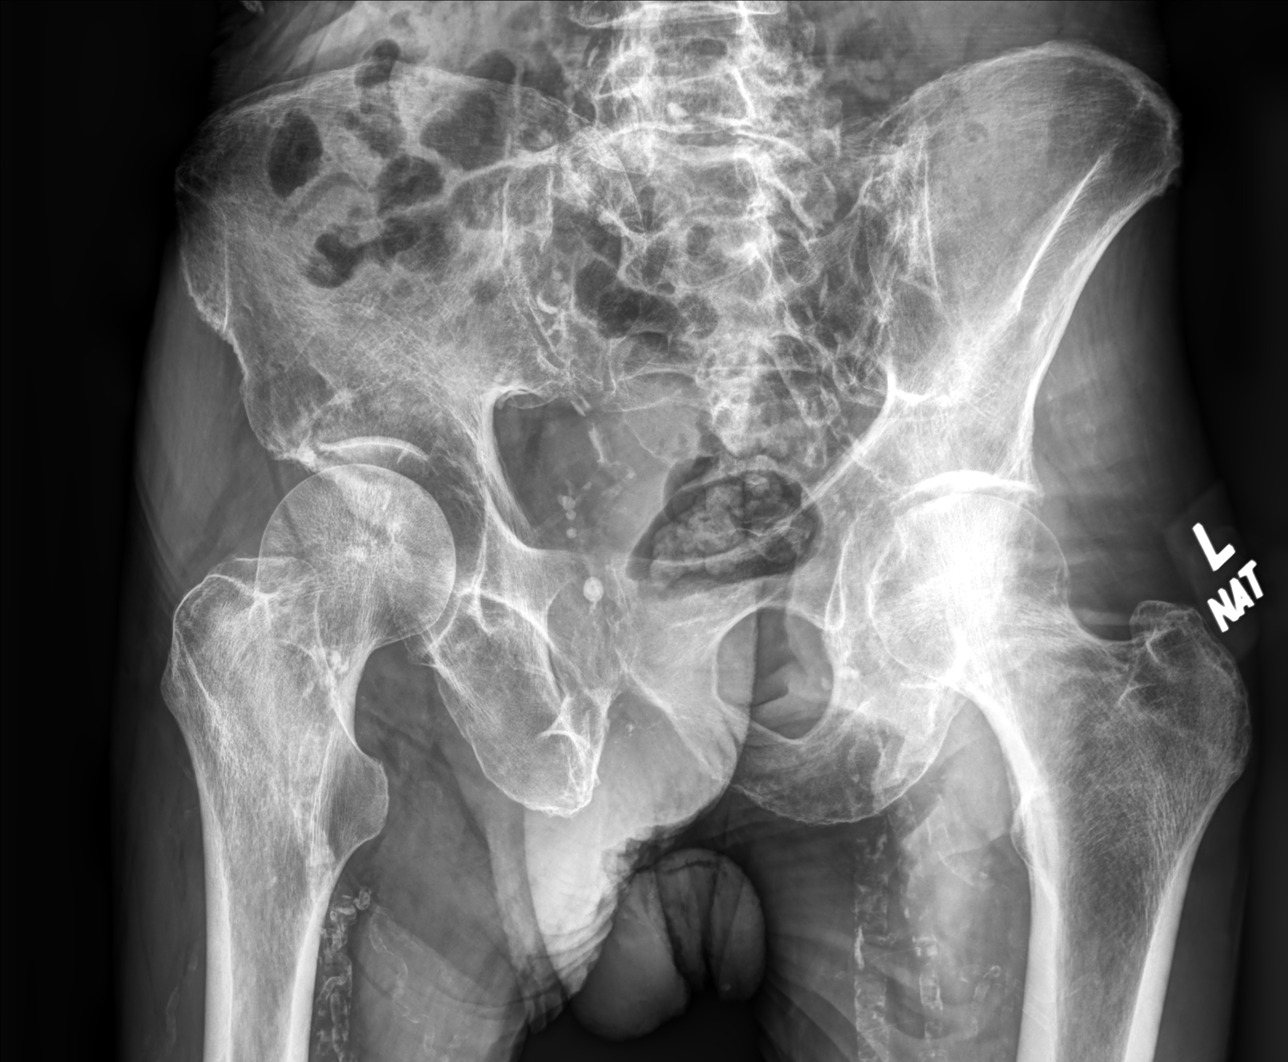

[hip joint ap]
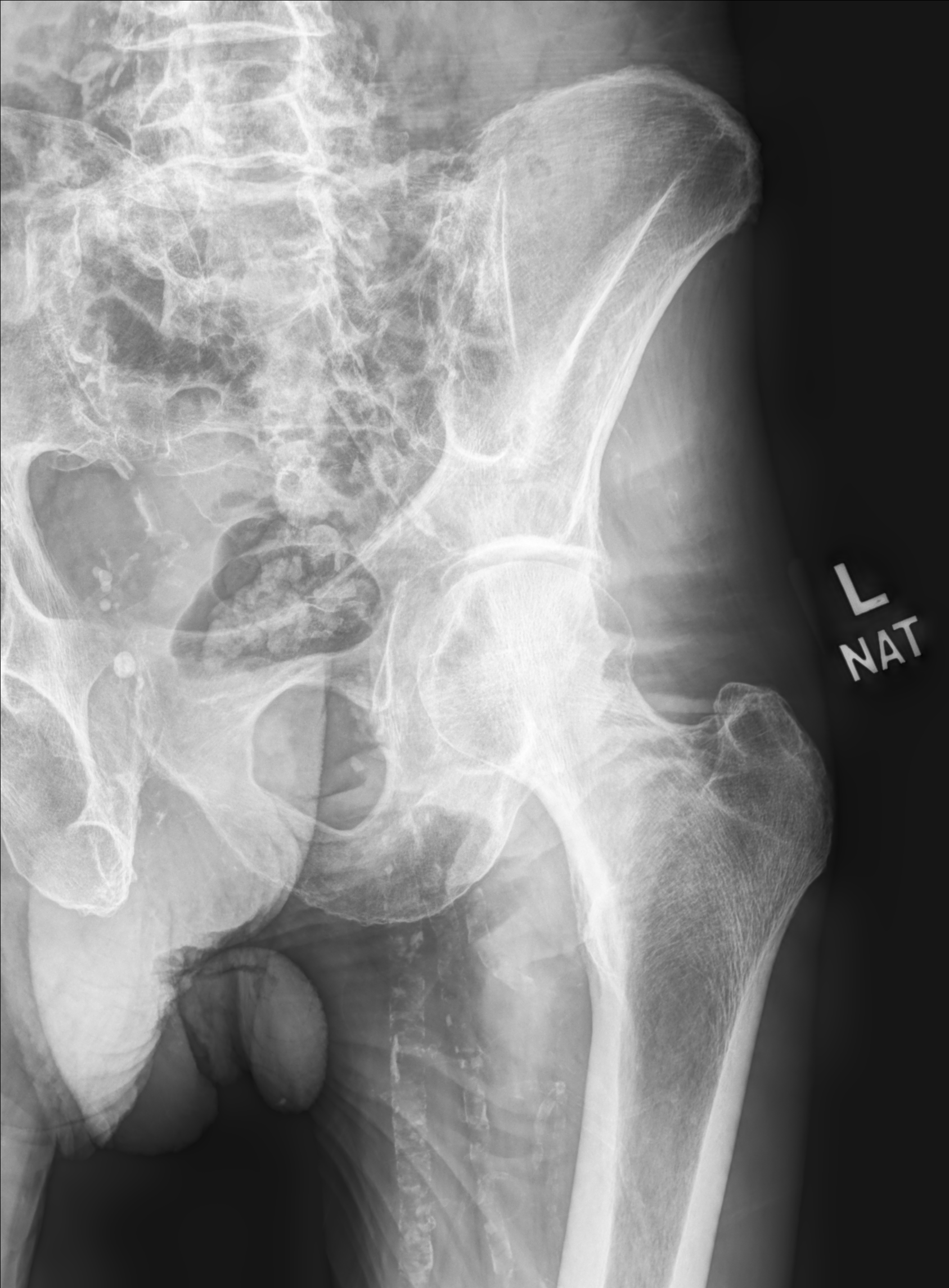

[hip joint [person_name] projection]
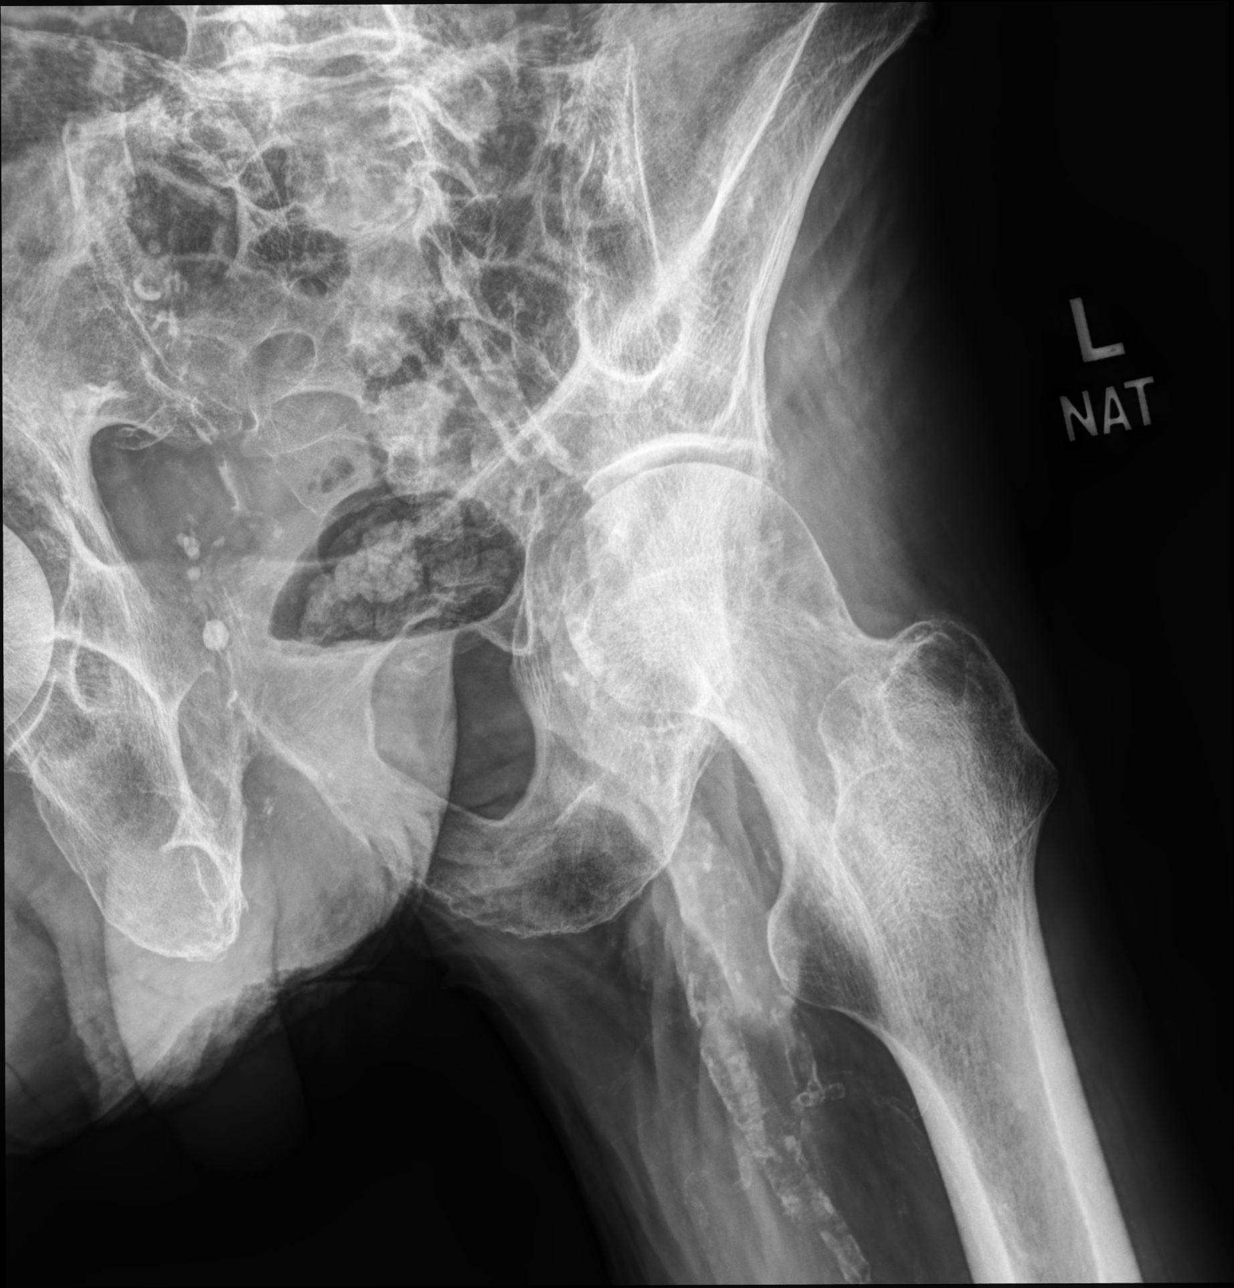

[3 of 3 positions shown; findings below may reference images not displayed]

FINDINGS: Osteopenia. No displaced fracture of the left hip or included bony
pelvis. Mild superior joint space loss and osteophytosis. Extensive
vascular calcinosis.
IMPRESSION: No displaced fracture of the left hip or included bony pelvis.
Please note that plain radiographs are significantly insensitive for
hip and pelvic fracture, particularly in the setting of osteopenia.
Consider CT or MRI to more sensitively evaluate for fracture if
there is high clinical concern.

## 2021-05-06 IMAGING — DX DG HIP (WITH OR WITHOUT PELVIS) 2-3V*R*
2 series · 2 of 2 positions shown · non-contrast
Comparison: [DATE]

CLINICAL DATA: Fall, right hip pain

EXAM:
DG HIP (WITH OR WITHOUT PELVIS) 2-3V RIGHT

[hip joint ap]
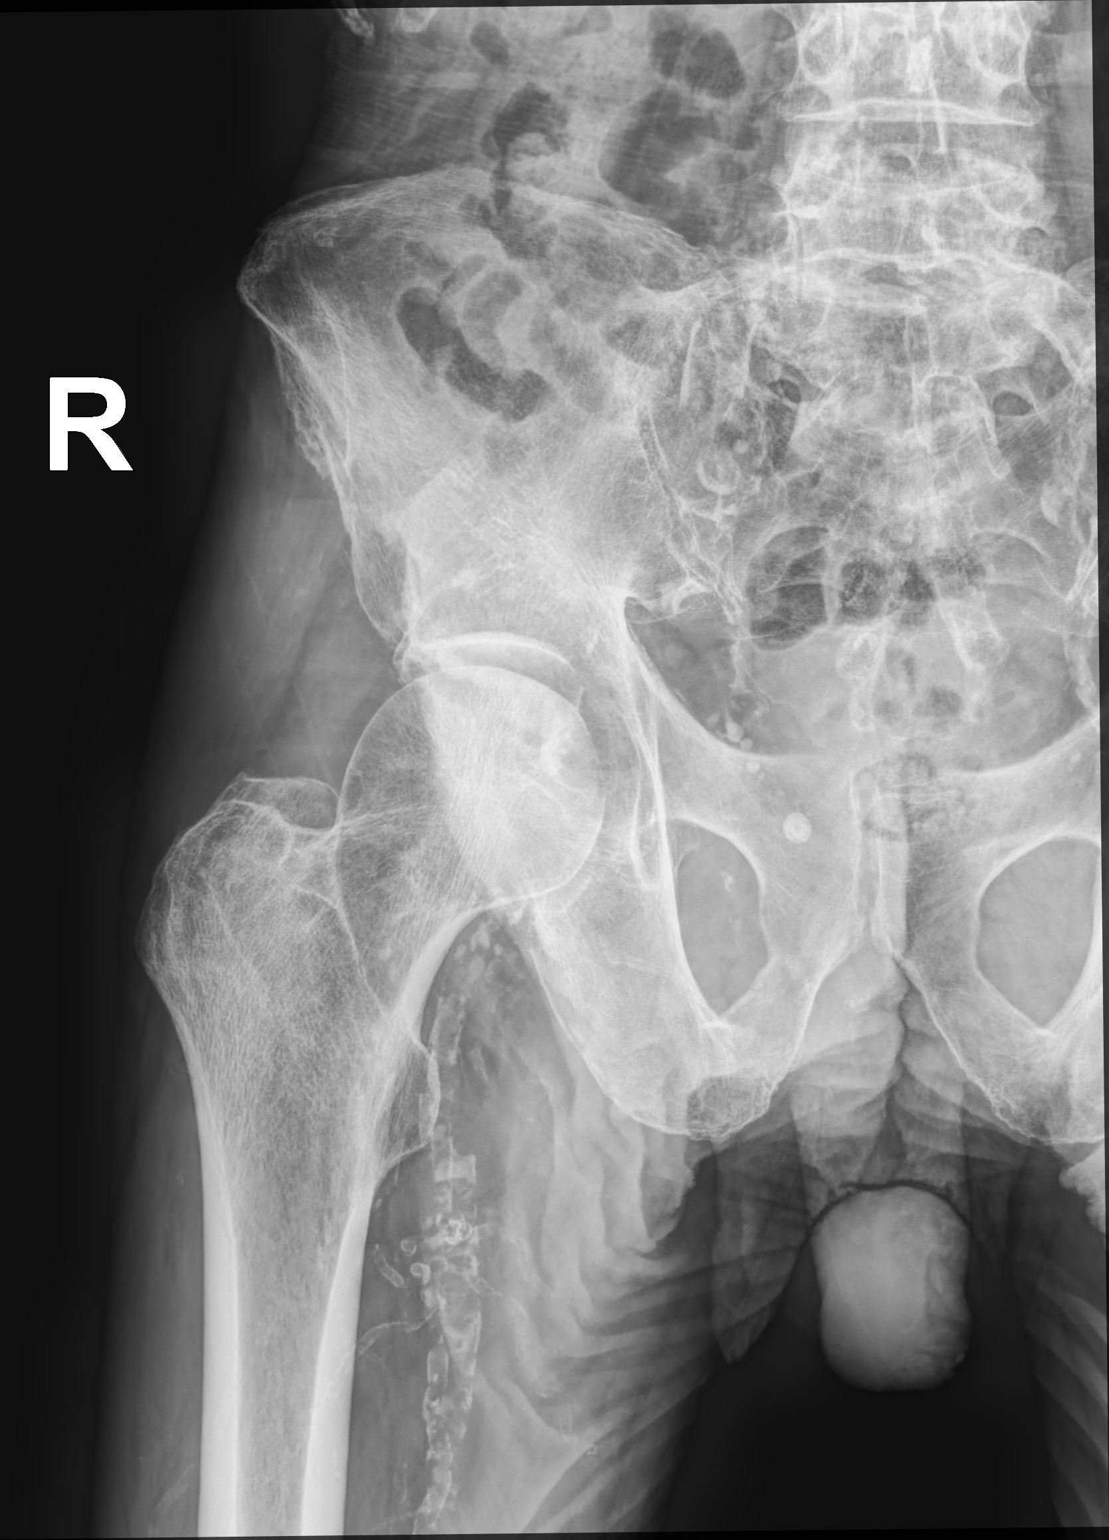

[hip joint [person_name] projection]
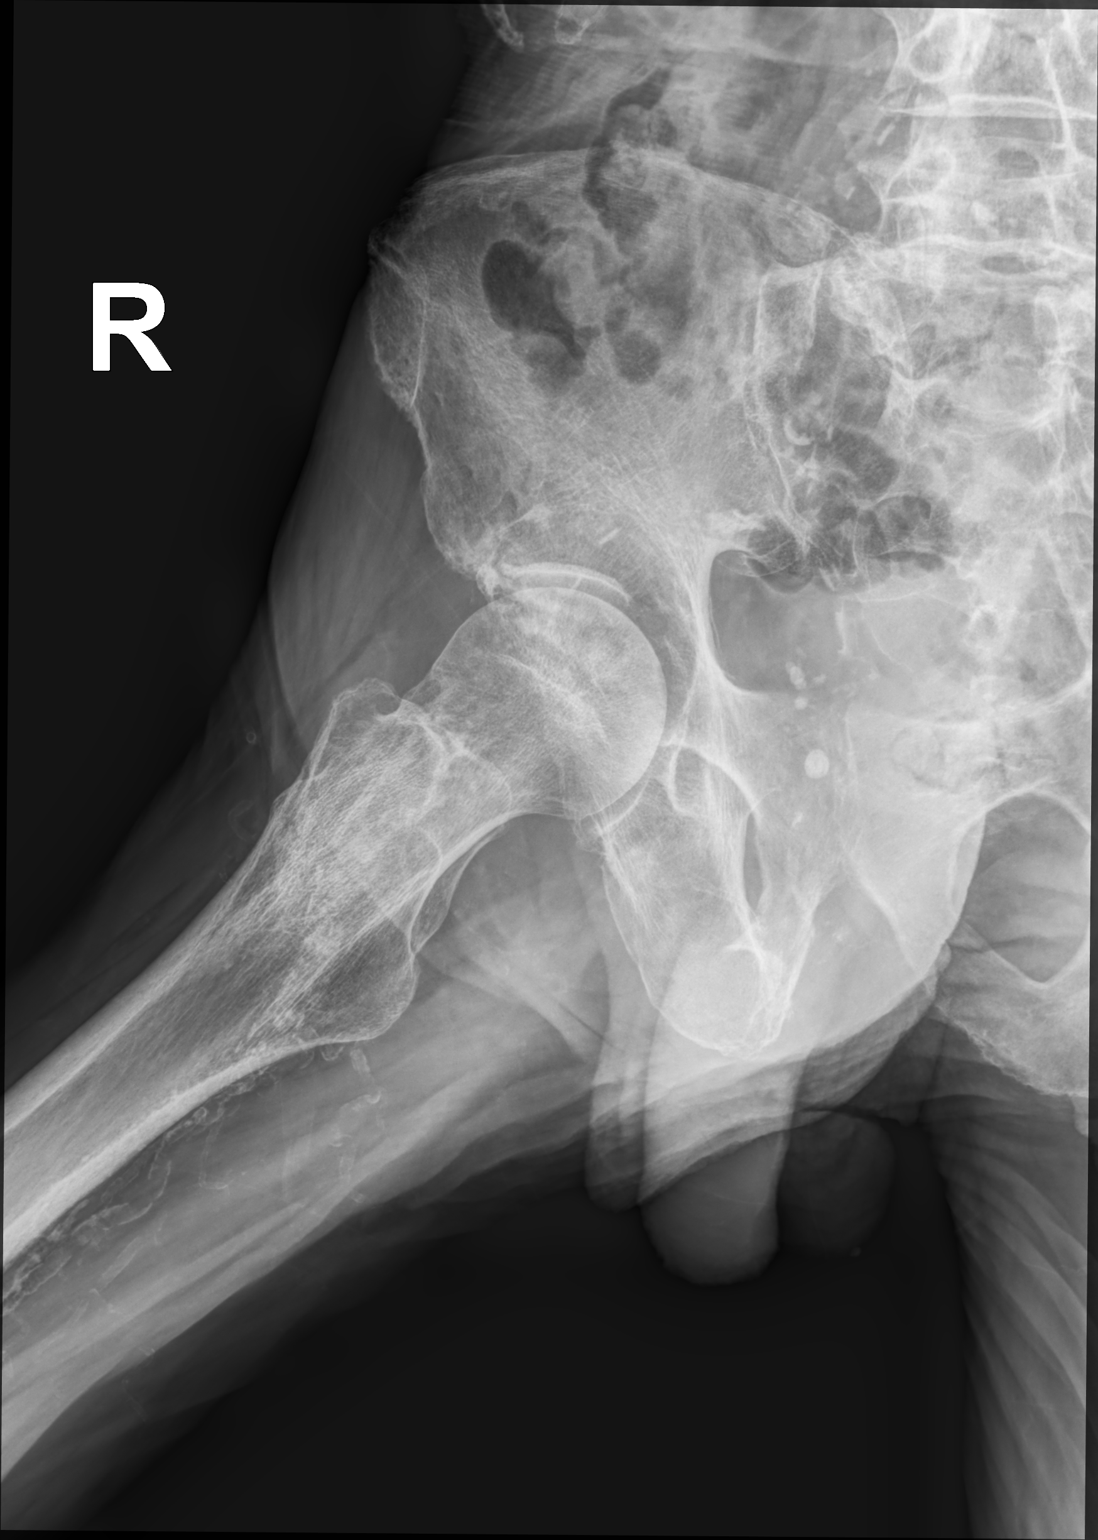

[2 of 2 positions shown; findings below may reference images not displayed]

FINDINGS: Osteopenia. There is no evidence of hip fracture or dislocation.
Right hip joint space is preserved. Extensive atherosclerotic
vascular calcifications.
IMPRESSION: Negative.

## 2021-05-06 IMAGING — CT CT T SPINE W/O CM
3 of 4 series · 9 of 33 positions shown, 10 images · non-contrast
Comparison: None.

CLINICAL DATA: Fall with back pain

EXAM:
CT THORACIC SPINE WITHOUT CONTRAST
TECHNIQUE: Multidetector CT images of the thoracic were obtained using the
standard protocol without intravenous contrast.

[Series 5: t-spine 2.0 st · axial · 0.36mm/px · z∈[-372,-372]mm · 1 of 180 slices shown, 2 images]
[im 90/180  soft-tissue]
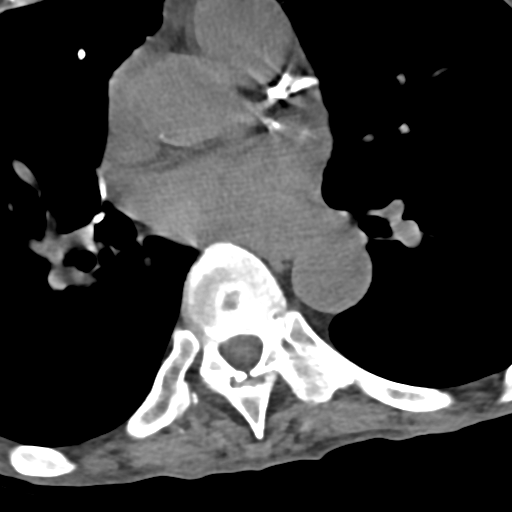
[im 90/180  bone]
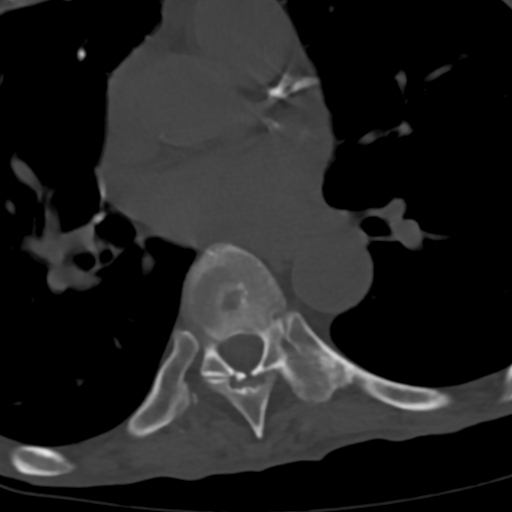

[Series 6: t-spine 2.0 cor bone · coronal · 0.37mm/px · 3 of 67 slices shown]
[im 14/67  bone]
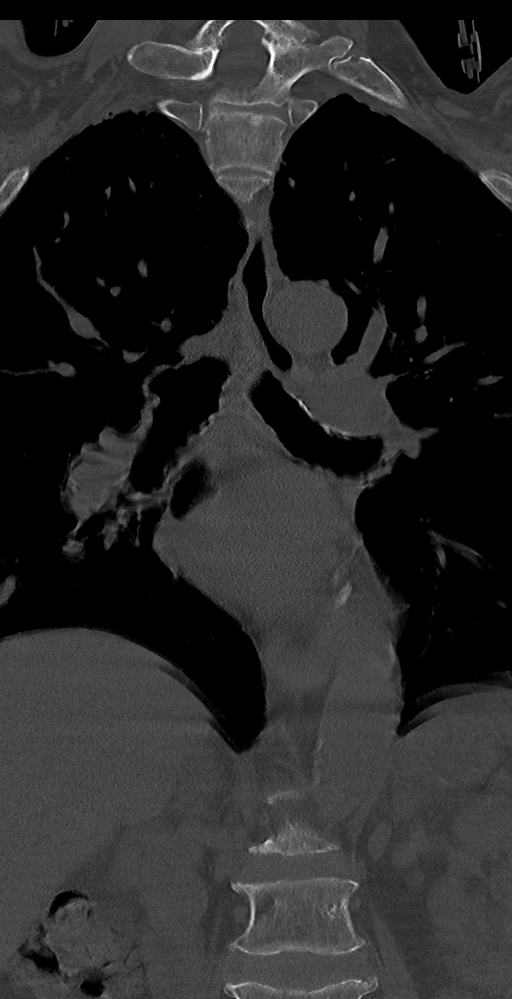
[im 27/67  bone]
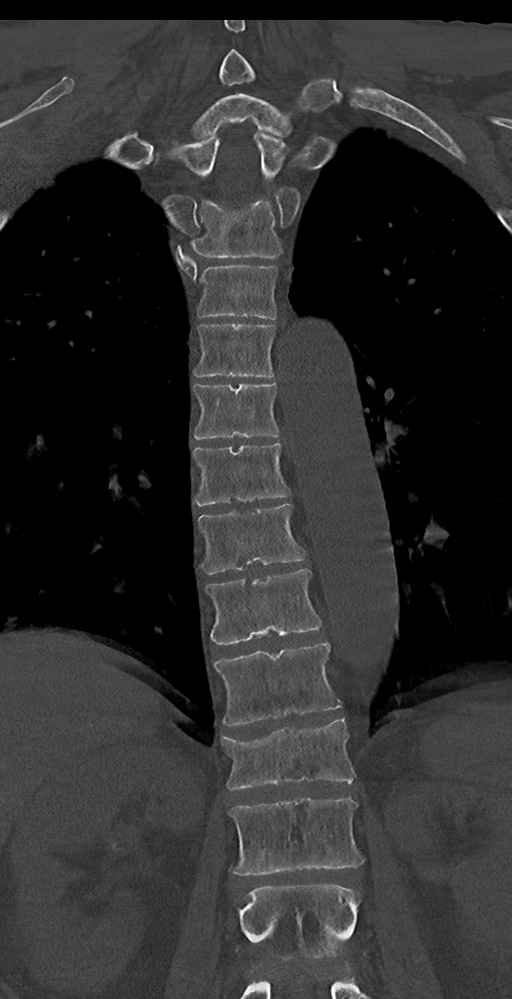
[im 40/67  bone]
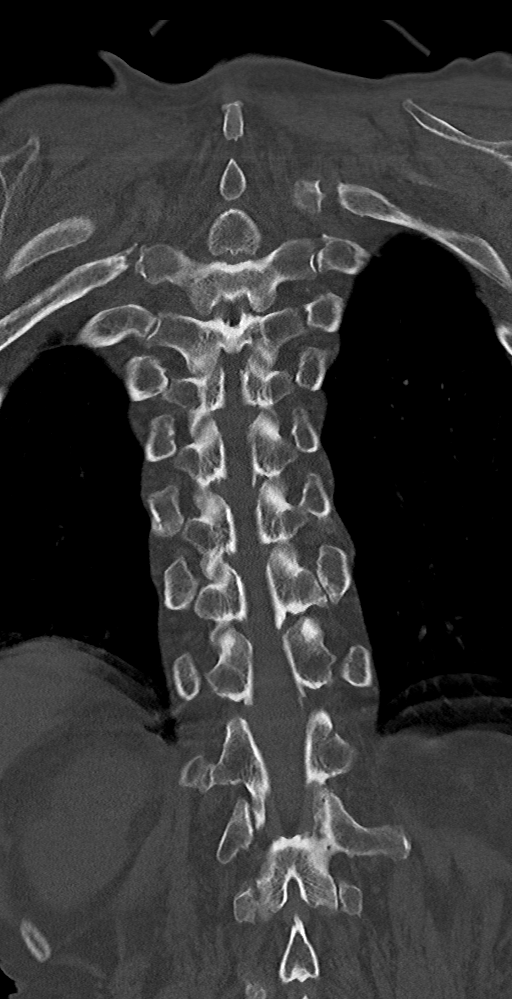

[Series 7: t-spine 2.0 sag bone · sagittal · 0.30mm/px · 5 of 81 slices shown]
[im 27/81  bone]
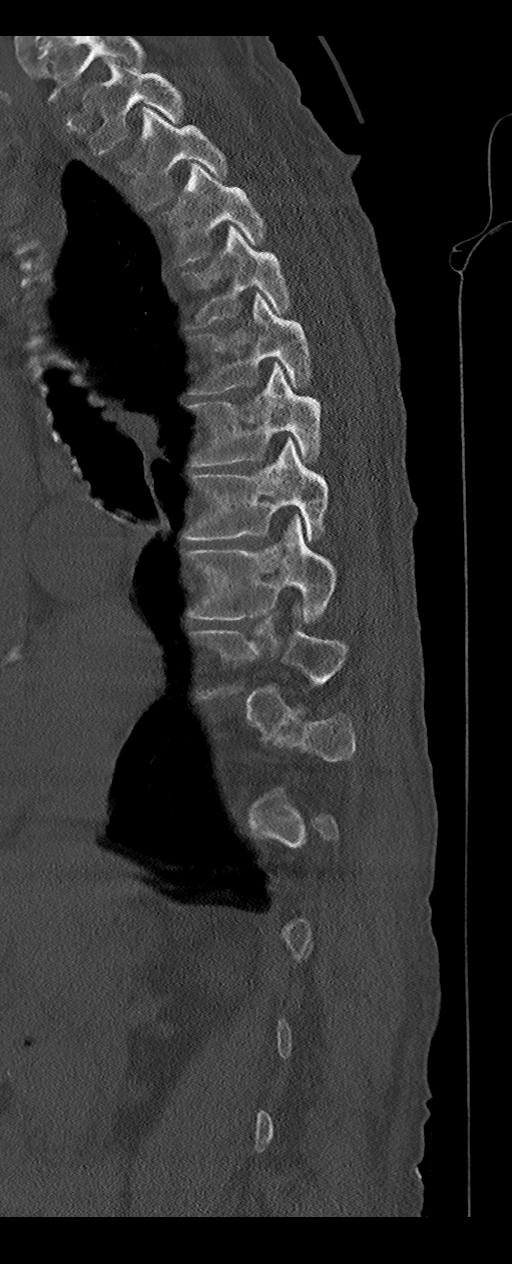
[im 34/81  bone]
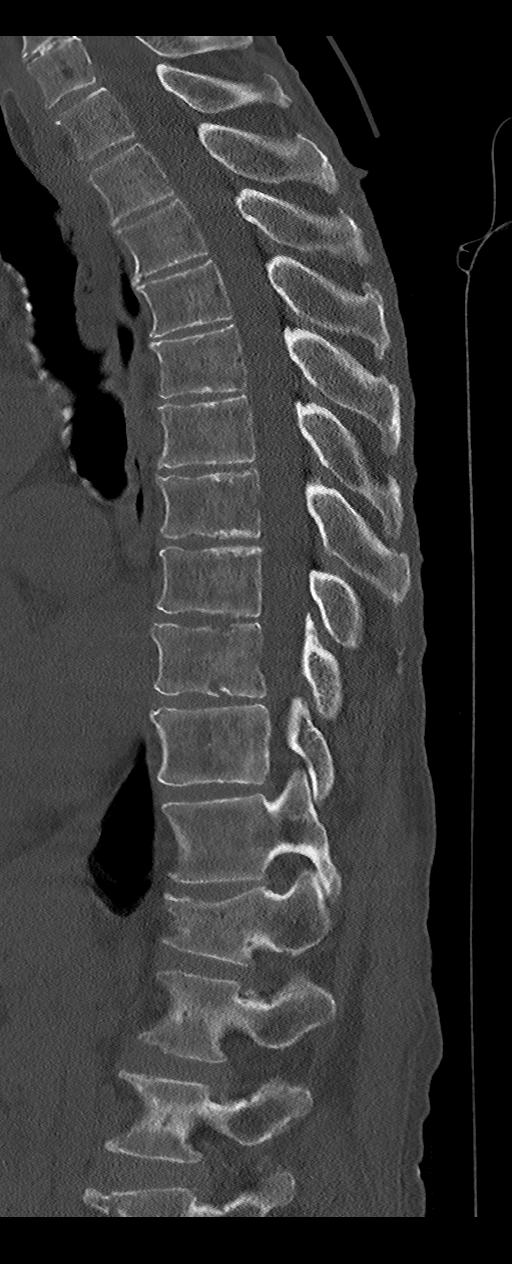
[im 41/81  bone]
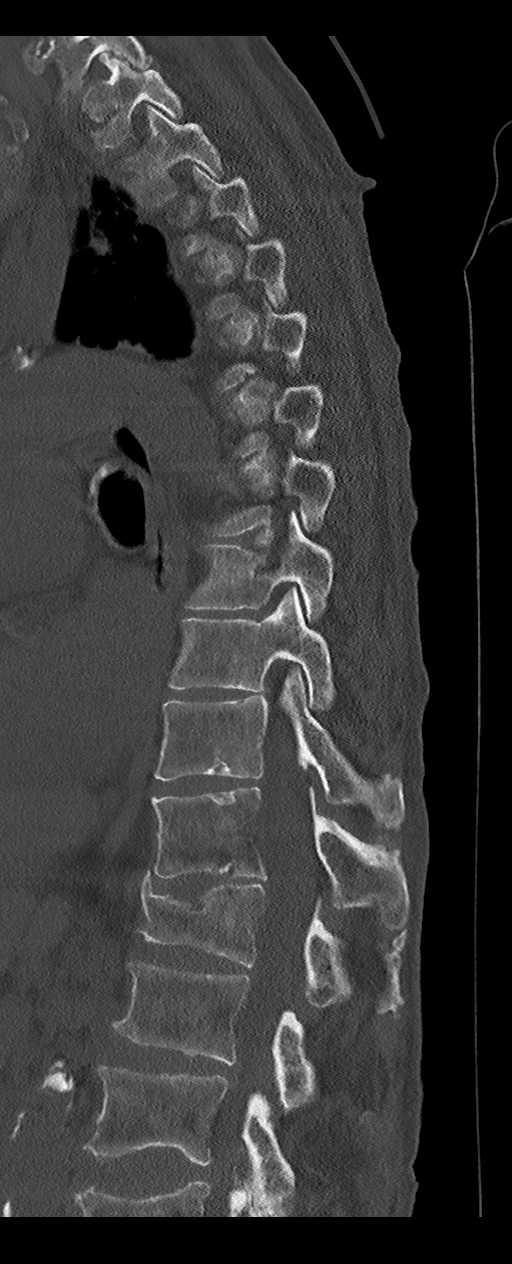
[im 47/81  bone]
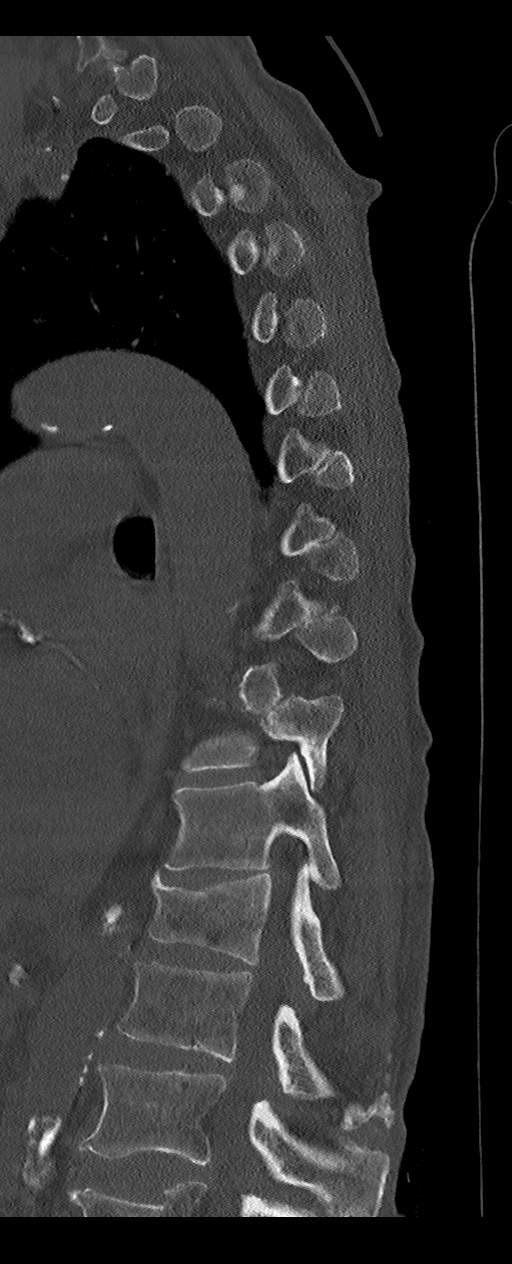
[im 54/81  bone]
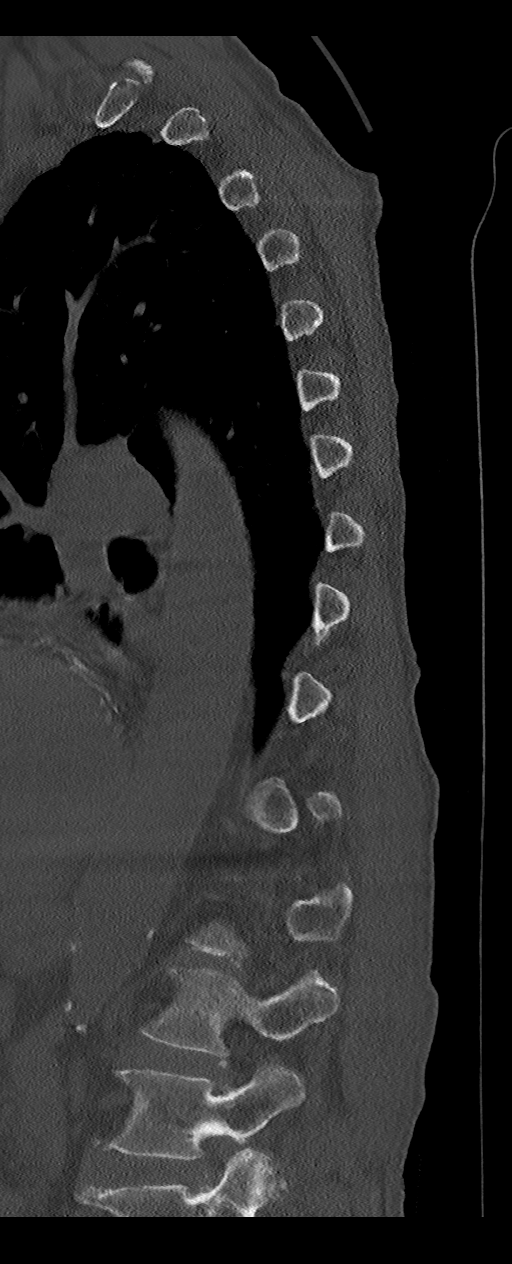

[9 of 33 positions shown; findings below may reference images not displayed]

FINDINGS: Alignment: Mild scoliotic curvature.

Vertebrae: Old superior endplate fracture at T12. No acute thoracic
region fracture.

Paraspinal and other soft tissues: Negative

Disc levels: No significant disc level pathology. No apparent
stenosis of the canal or foramina.
IMPRESSION: No acute thoracic region finding. Old superior endplate fracture at
T12. Mild scoliotic curvature.

## 2021-05-06 IMAGING — CT CT CERVICAL SPINE W/O CM
4 series · 14 of 33 positions shown, 16 images · non-contrast
Comparison: Radiography same day

CLINICAL DATA: Fell 2 weeks ago.  C2 fracture shown by radiography.

EXAM:
CT CERVICAL SPINE WITHOUT CONTRAST
TECHNIQUE: Multidetector CT imaging of the cervical spine was performed without
intravenous contrast. Multiplanar CT image reconstructions were also
generated.

[Series 4: c_spine 2.0 st · axial · 0.35mm/px · z∈[-206,-120]mm · 4 of 73 slices shown, 5 images]
[im 15/73  soft-tissue]
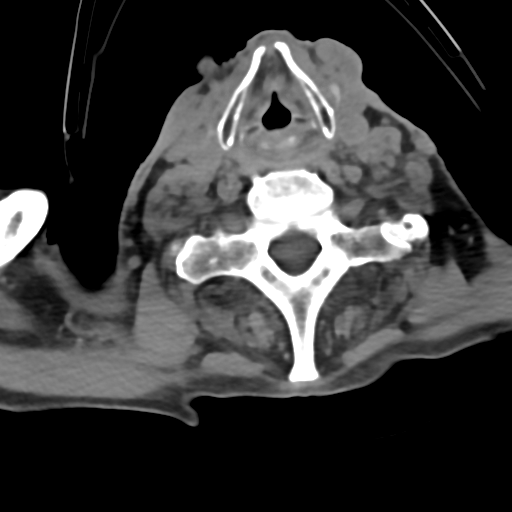
[im 15/73  bone]
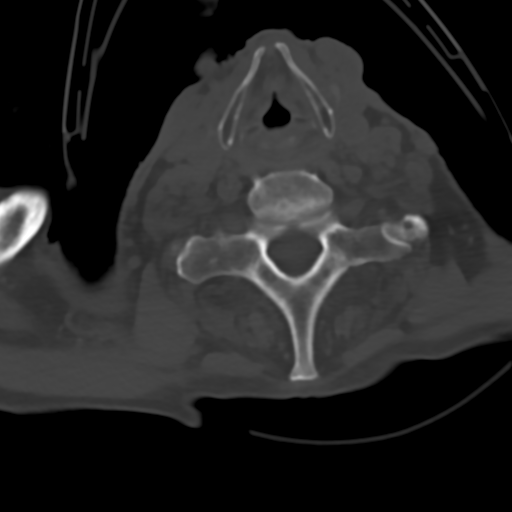
[im 29/73  bone]
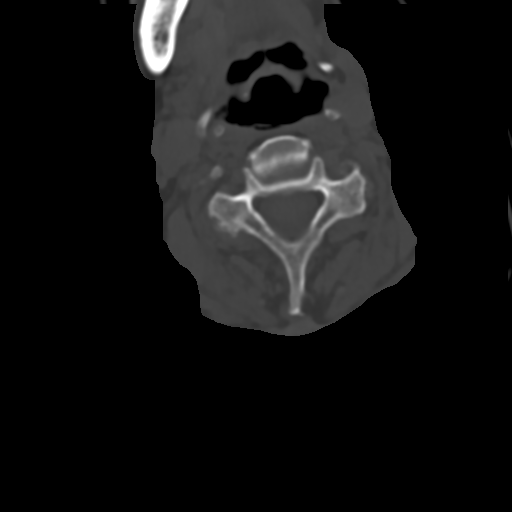
[im 44/73  bone]
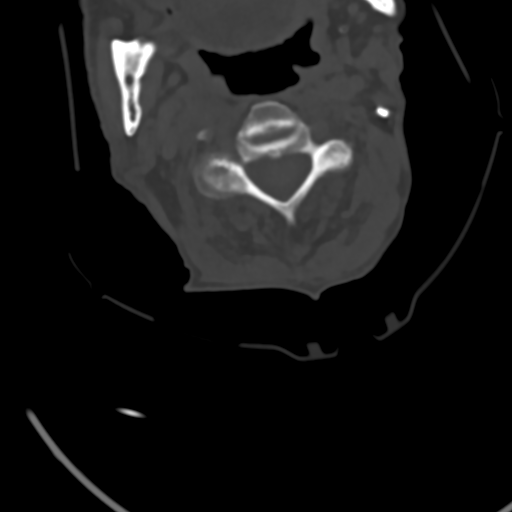
[im 58/73  bone]
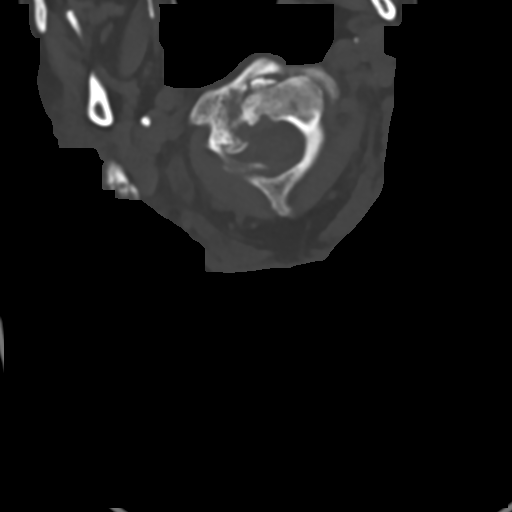

[Series 6: c_spine 2.0 sag bone · sagittal · 0.23mm/px · 5 of 61 slices shown, 6 images]
[im 21/61  bone]
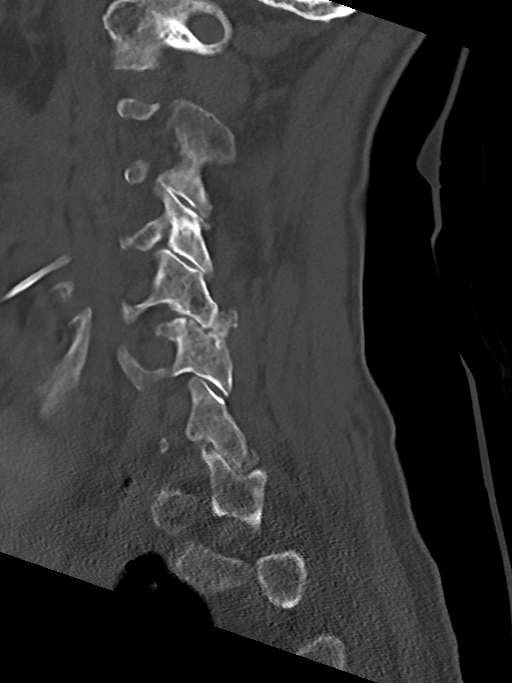
[im 26/61  bone]
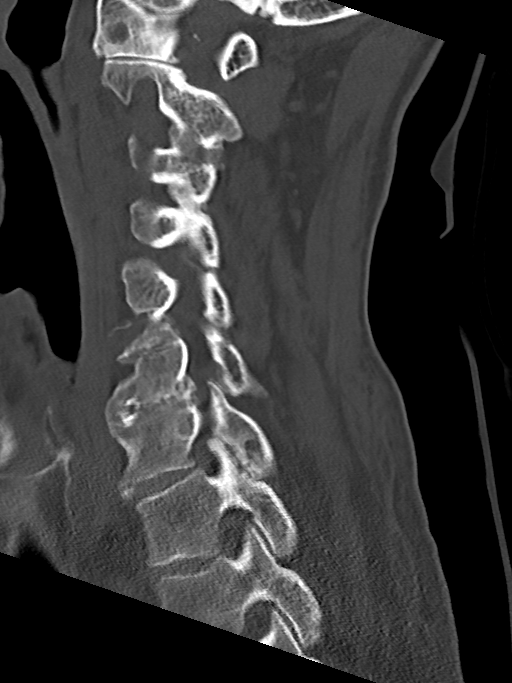
[im 31/61  soft-tissue]
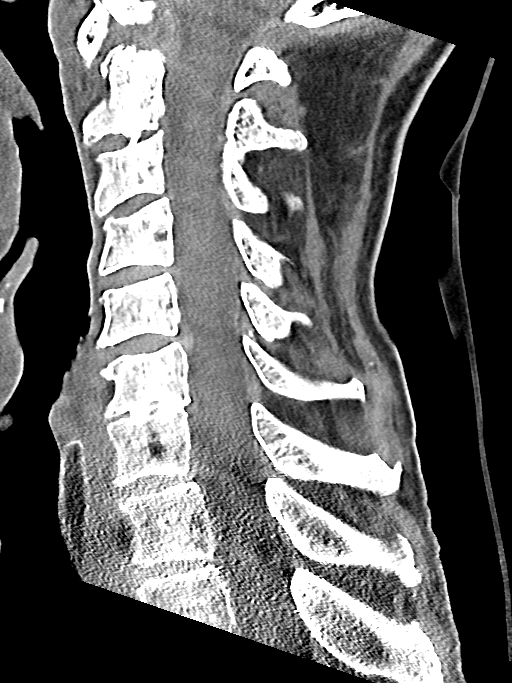
[im 31/61  bone]
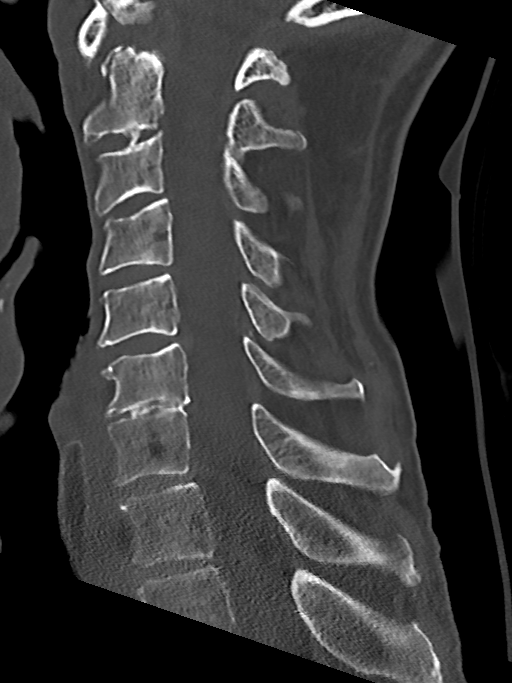
[im 36/61  bone]
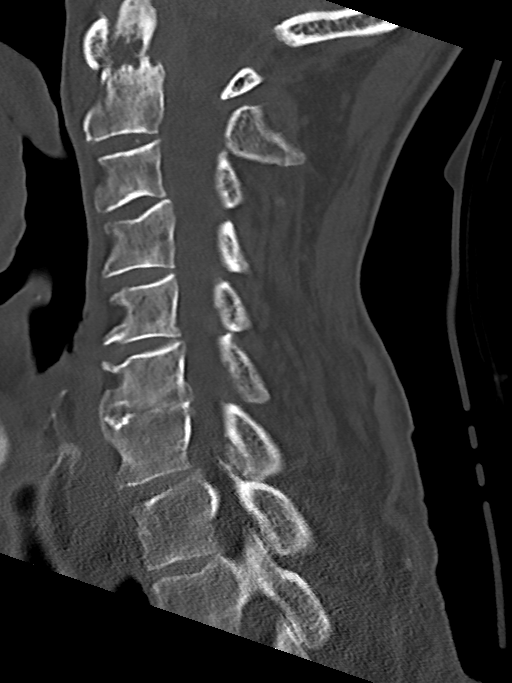
[im 41/61  bone]
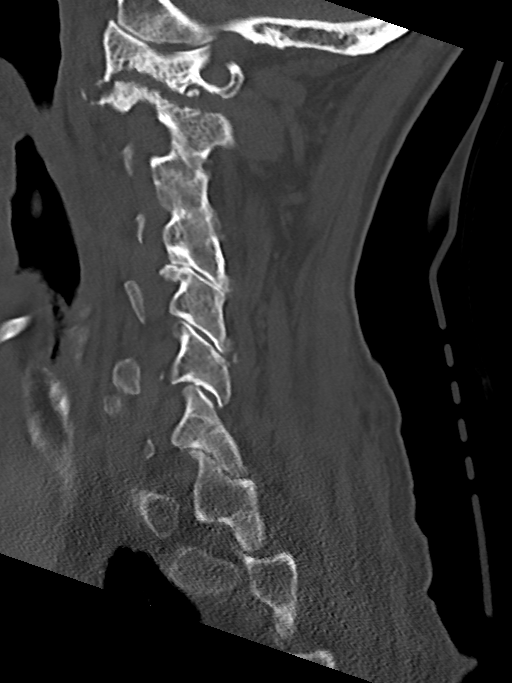

[Series 7: c_spine 2.0 cor bone · coronal · 0.23mm/px · 3 of 61 slices shown]
[im 13/61  bone]
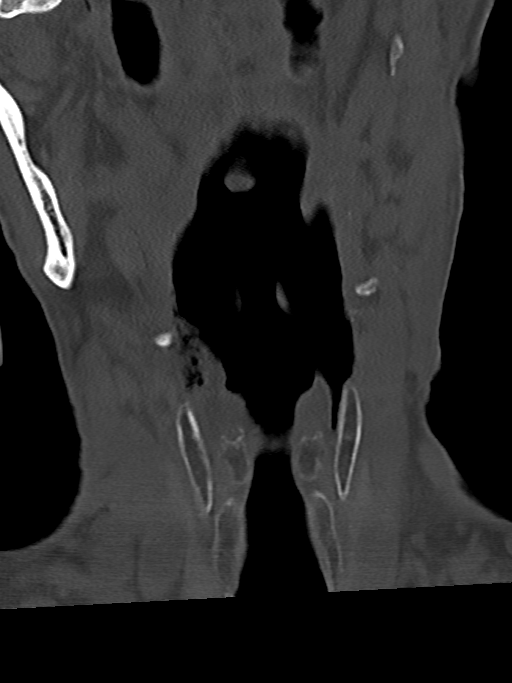
[im 25/61  bone]
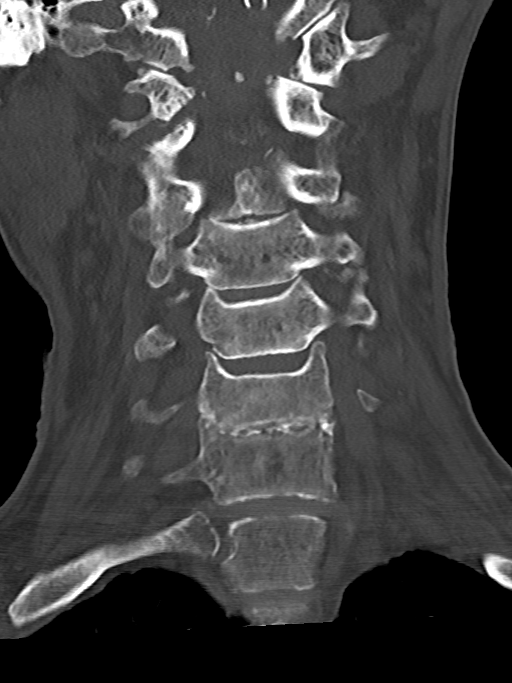
[im 37/61  bone]
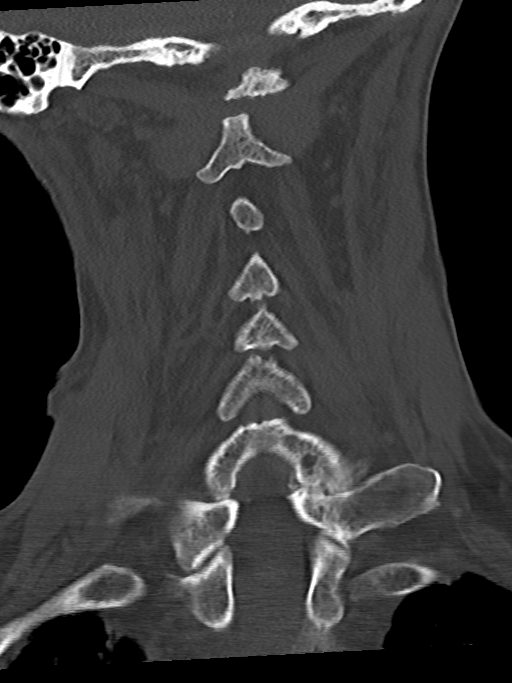

[Series 9: c_spine 2.0 orthogonals · axial · 0.21mm/px · z∈[-232,-213]mm · 2 of 85 slices shown]
[im 13/85  bone]
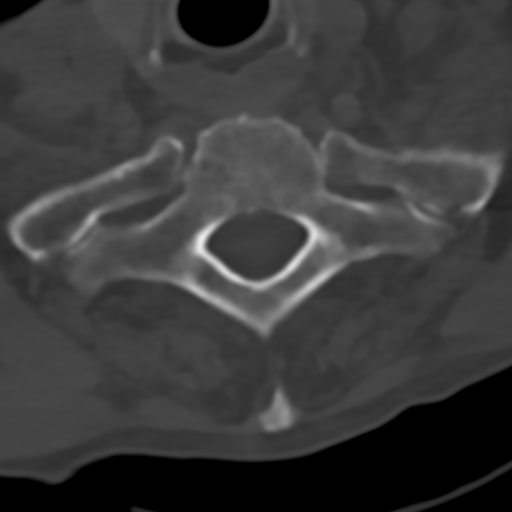
[im 25/85  bone]
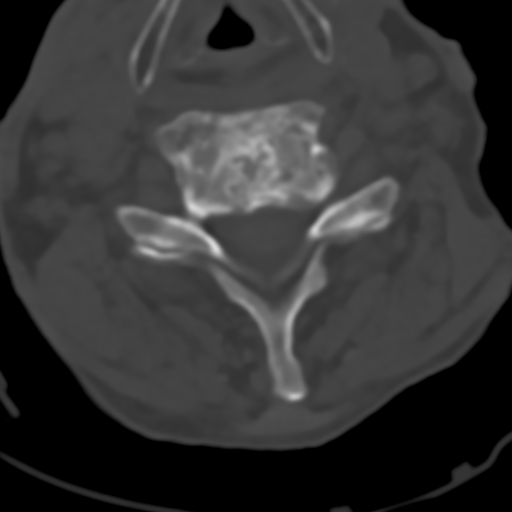

[14 of 33 positions shown; findings below may reference images not displayed]

FINDINGS: Alignment: No malalignment.

Skull base and vertebrae: Clivus is not included on the scan. No C1
fracture. There are pronounced arthritic changes of the C1-2
articulation, more on the right than the left, with some cystic
change. There is a C2 fracture extending primarily in the coronal
plane from the base of the dens down through the vertebral body to
the L2-3 disc. No displacement of the dens. No malalignment. No
other regional fracture.

Soft tissues and spinal canal: No soft tissue finding other than
atherosclerotic calcification at the carotid bifurcation regions.

Disc levels:  Pronounced arthritic changes at C1-2 as noted above.

C2-3: Chronic facet fusion.  No stenosis.

C3-4: Chronic facet fusion.  No stenosis.

C4-5: Mild disc bulge and facet degeneration.  No stenosis.

C5-6: Mild disc bulge. Facet degeneration worse on the left. Mild
bony foraminal narrowing on the left.

C6-7: Chronic fusion across the disc space. Mild chronic bilateral
bony foraminal narrowing.

C7-T1: Facet osteoarthritis worse on the left. No canal or foraminal
stenosis.

Upper chest: Negative

Other: None
IMPRESSION: C2 fracture extending from the base of the dens in the coronal plane
all the way down to the C2-3 disc level. No displacement.

Pronounced chronic arthropathy of the C1-2 articulation, right worse
than left.

Chronic cervical fusion of the facets from C2 through C4. Chronic
interbody fusion at C6-7.

These results were called by telephone at the time of interpretation
on [DATE] at [DATE] to provider PE , who verbally
acknowledged these results.

## 2021-05-06 IMAGING — DX DG CERVICAL SPINE 2 OR 3 VIEWS
2 series · 2 of 2 positions shown · non-contrast
Comparison: CT scan of the cervical spine from [YR]

CLINICAL DATA: Neck pain.

EXAM:
CERVICAL SPINE - 2-3 VIEW

[cervical spine ap]
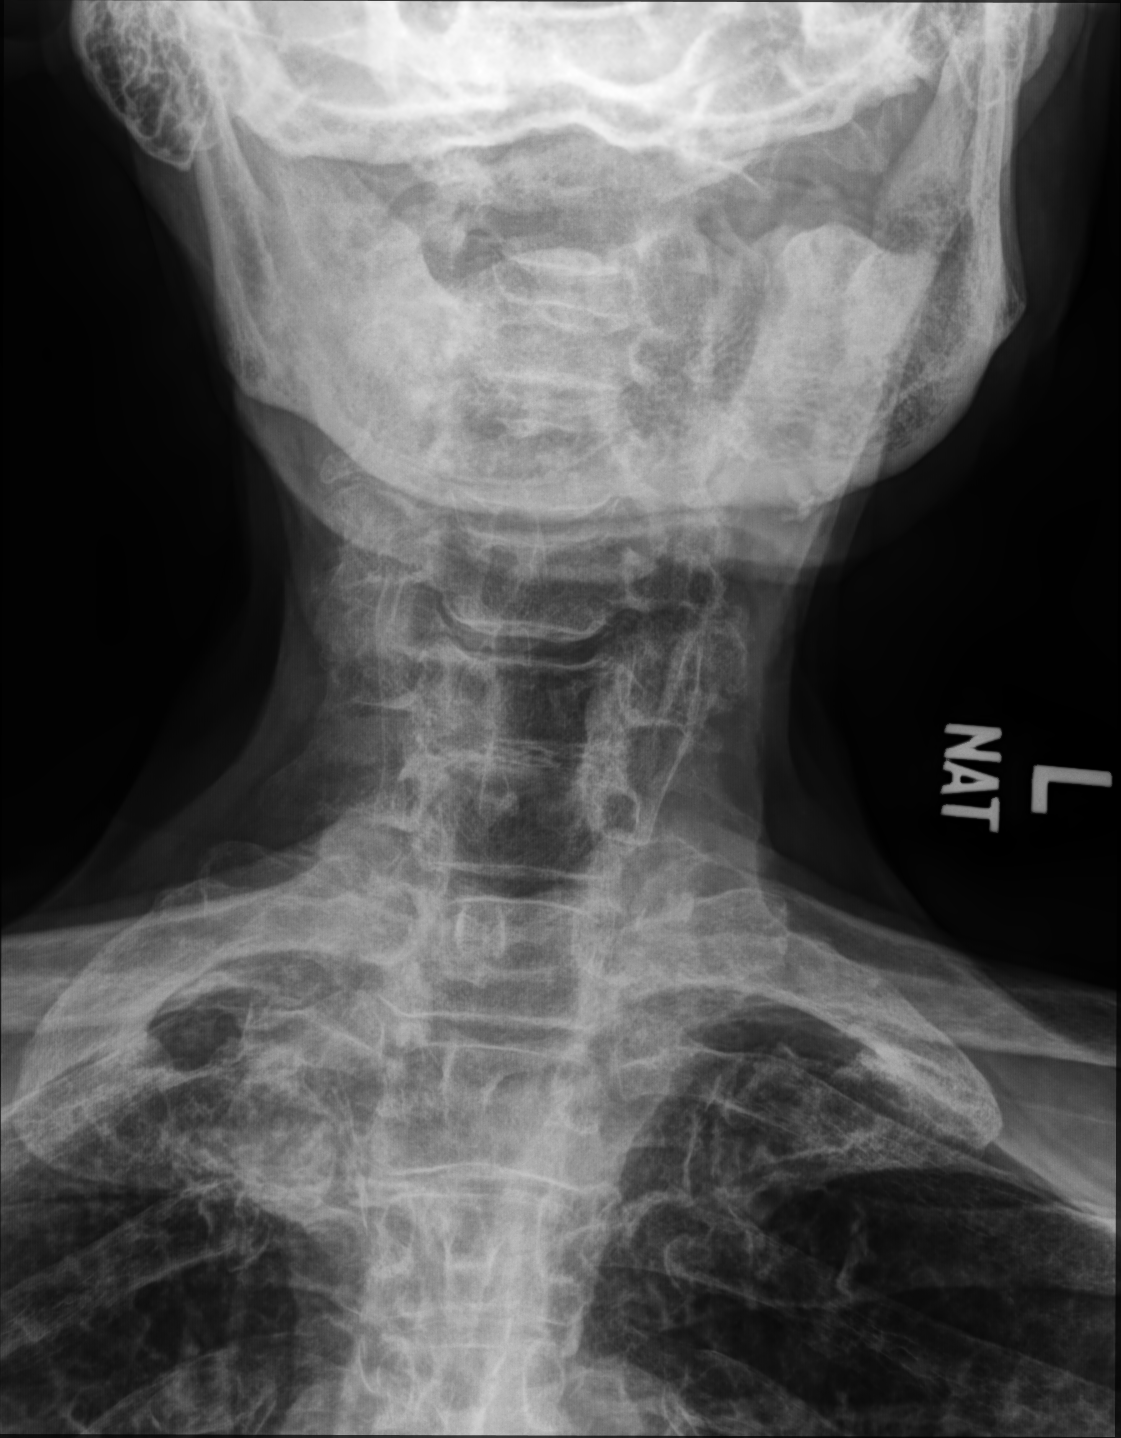

[cervical spine lat]
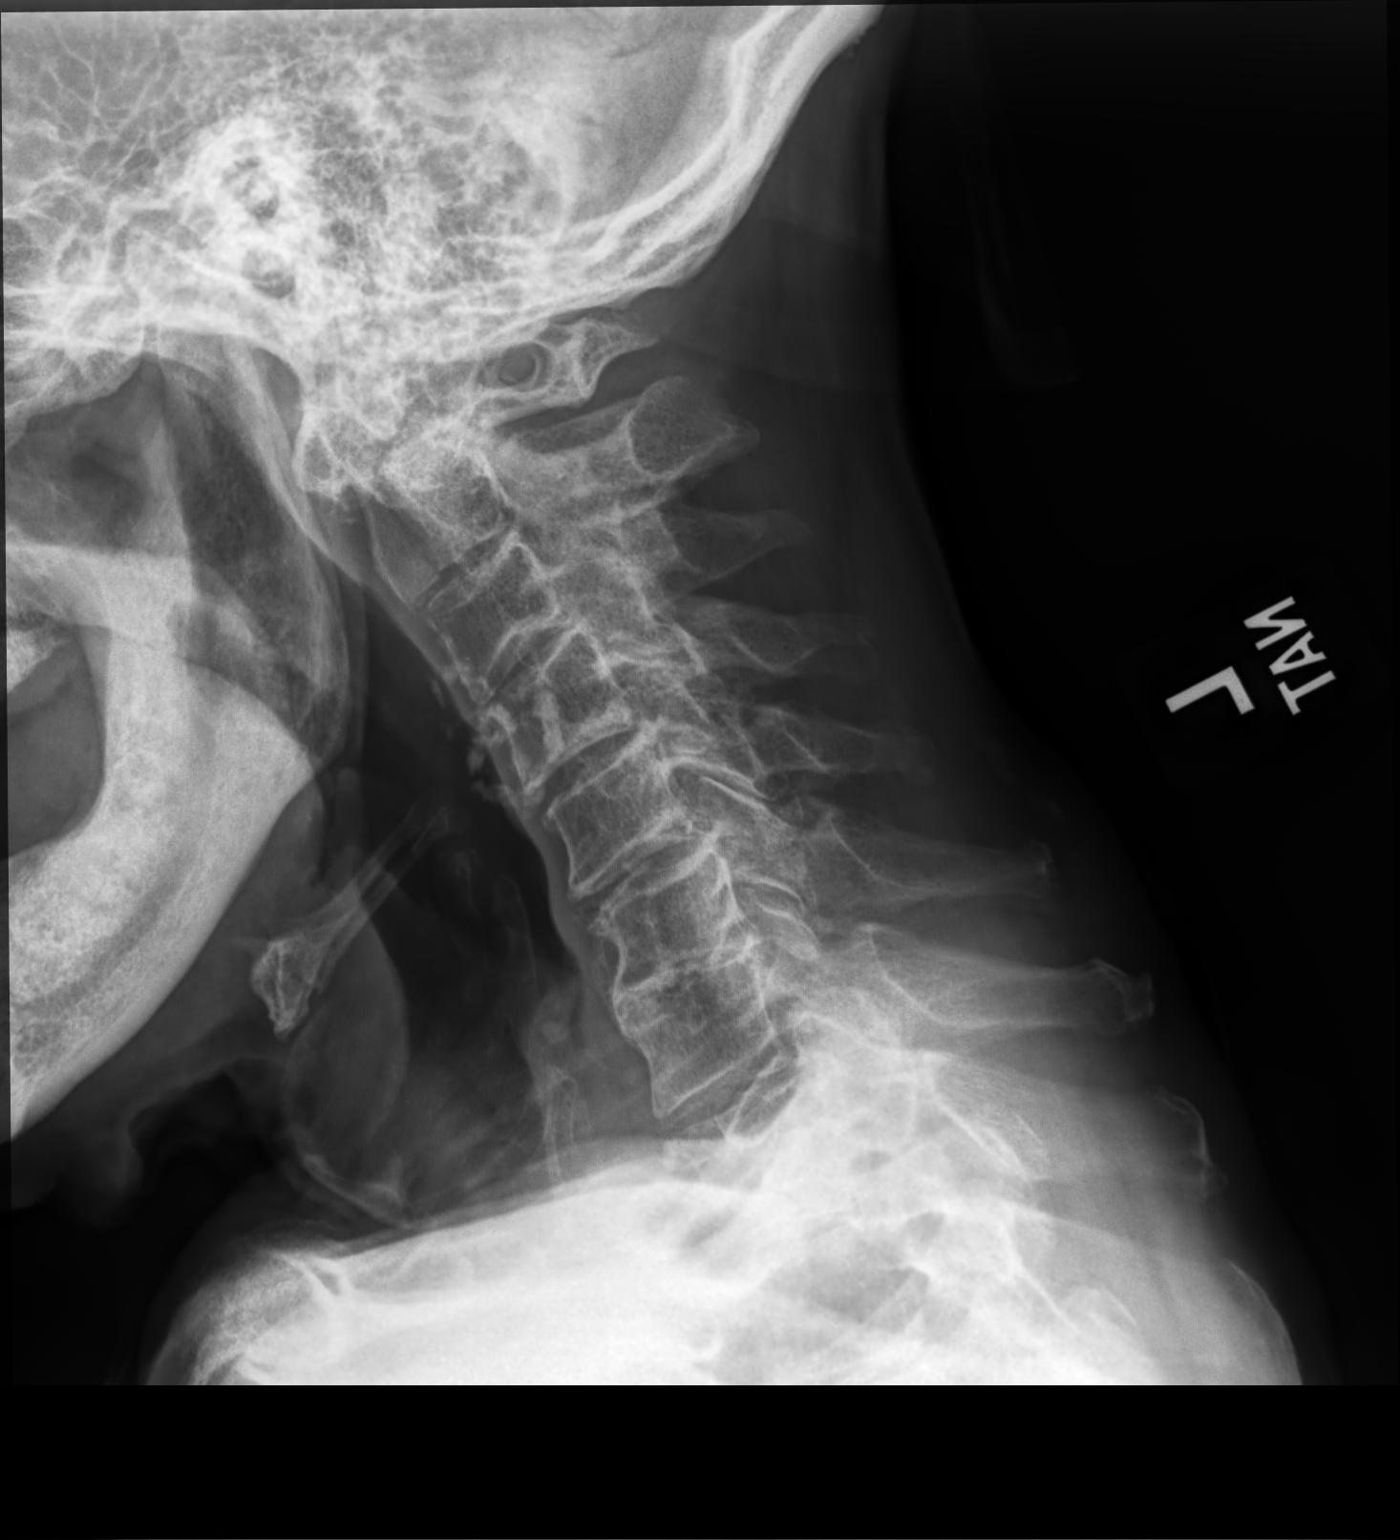

[2 of 2 positions shown; findings below may reference images not displayed]

FINDINGS: Reversal of the normal cervical lordosis could be due to muscle
spasm, pain, positioning or degenerative disease. No abnormal
prevertebral soft tissue swelling. Complex appearing fracture of C2
with widening of the predental space. Recommend cervical spine CT
for further evaluation. No other cervical spine fractures are
identified.

Advanced degenerative cervical spondylosis with multilevel disc
disease and facet disease. Some of the upper facet joints appear
fused.

The lung apices are grossly clear.
IMPRESSION: 1. Complex fracture of C2. Recommend immobilization and CT cervical
spine for further evaluation.
2. Reversal of the normal cervical lordosis.
3. Advanced degenerative cervical spondylosis with multilevel disc
disease and facet disease.

These results will be called to the ordering clinician or
representative by the Radiologist Assistant, and communication
documented in the PACS or [REDACTED].

## 2021-05-06 MED ORDER — ACETAMINOPHEN 500 MG PO TABS
1000.0000 mg | ORAL_TABLET | Freq: Once | ORAL | Status: AC
  Administered 2021-05-06: 1000 mg via ORAL

## 2021-05-06 MED ORDER — KETOROLAC TROMETHAMINE 60 MG/2ML IM SOLN
30.0000 mg | Freq: Once | INTRAMUSCULAR | Status: AC
  Administered 2021-05-06: 30 mg via INTRAMUSCULAR
  Filled 2021-05-06: qty 2

## 2021-05-06 NOTE — Discharge Instructions (Signed)
Keep your neck collar on at all times.  Call the number below to set up a follow-up appointment with the neurosurgery team in 2 weeks.  Return to the ED if you experience any worsening symptoms.

## 2021-05-06 NOTE — ED Triage Notes (Signed)
BIB [facility] after Urgent Care staff called to report pt having possible C-2 fracture. Per EMS pt fell x 2 weeks ago and went to urgent care today for ongoing neck pain. Pt placed in [place] J on arrival.  Hx: arthritis

## 2021-05-06 NOTE — ED Notes (Signed)
The pt was [facility]  neck pain    for 2 weeks alert hard of hearing  wife at  the bedside

## 2021-05-06 NOTE — ED Provider Notes (Addendum)
[facility]    CSN: [id number] Arrival date & time: 05/06/21  1245      History   Chief Complaint Chief Complaint  Patient presents with   Fall    HPI Gary Stevenson is a 80 y.o. male.   Patient presents with right-sided hip pain and right sided neck pain that started approximately 2 weeks ago after a fall.  Patient reports that he fell out of a car and landed on his right side.  Denies hitting head or losing consciousness.  Patient and caregiver report that he does not take any type of blood thinners.  Patient is able to bear weight but with difficulty.  Denies any numbness or tingling.  Denies any pain in any other part of the body. Patient also has elevated blood pressure reading but denies chest pain, shortness of breath, headache, blurred vision, dizziness. Blood pressure seems consistent with previous readings. No documentation or reports of daily BP medications.    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   Essential hypertension 03/17/2014   Anemia due to chronic blood loss 03/17/2014   Heme + stool 03/17/2014   Normocytic anemia 03/17/2014   COPD UNSPECIFIED 01/03/2009   DYSPNEA 01/03/2009   SWELLING/MASS/LUMP IN CHEST 01/03/2009    Past Surgical History:  Procedure Laterality Date   BRAIN SURGERY     COLONOSCOPY N/A 03/18/2014   Procedure: COLONOSCOPY;  Surgeon: Juanita Craver, MD;  Location: [facility];  Service: Endoscopy;  Laterality: N/A;   ESOPHAGOGASTRODUODENOSCOPY N/A 03/18/2014   Procedure: ESOPHAGOGASTRODUODENOSCOPY (EGD);  Surgeon: Juanita Craver, MD;  Location: [facility];  Service:  Endoscopy;  Laterality: N/A;   LUNG SURGERY         Home Medications    Prior to Admission medications   Medication Sig Start Date End Date Taking? Authorizing Provider  indomethacin (INDOCIN) 50 MG capsule Take 1 capsule (50 mg total) by mouth 2 (two) times daily as needed. 02/19/21   Volney American, PA-C  meloxicam (MOBIC) 15 MG tablet Take 1 tablet (15 mg total) by mouth daily. Patient not taking: Reported on 02/19/2021 12/17/20   Wallene Huh, DPM  prednisoLONE acetate (PRED FORTE) 1 % ophthalmic suspension SMARTSIG:1 Drop(s) Right Eye Morning-Night Patient not taking: Reported on 02/19/2021 10/09/20   [provider]    Family History History reviewed. No pertinent family history.  Social History Social History   Tobacco Use   Smoking status: Former   Smokeless tobacco: Never  Substance Use Topics   Alcohol use: Yes    Comment: "I drink whenever I have money"   Drug use: No     Allergies   Patient has no known allergies.   Review of Systems Review of Systems Per HPI  Physical Exam Triage Vital Signs ED Triage Vitals [05/06/21 1118]  Enc Vitals Group     BP (!) 197/111     Pulse Rate 87     Resp 18     Temp 97.6 F (36.4 C)     Temp Source Oral     SpO2 97 %  Weight      Height      Head Circumference      Peak Flow      Pain Score 10     Pain Loc      Pain Edu?      Excl. in [place]?    No data found.  Updated Vital Signs BP (!) 171/91 (BP Location: Left Arm)   Pulse 86   Temp 98 F (36.7 C) (Oral)   Resp 18   SpO2 97%   Visual Acuity Right Eye Distance:   Left Eye Distance:   Bilateral Distance:    Right Eye Near:   Left Eye Near:    Bilateral Near:     Physical Exam Constitutional:      General: He is not in acute distress.    Appearance: Normal appearance. He is not toxic-appearing or diaphoretic.  HENT:     Head: Normocephalic and atraumatic.  Eyes:     Extraocular Movements: Extraocular movements intact.      Conjunctiva/sclera: Conjunctivae normal.     Pupils: Pupils are equal, round, and reactive to light.  Cardiovascular:     Rate and Rhythm: Normal rate and regular rhythm.     Pulses: Normal pulses.     Heart sounds: Normal heart sounds.  Pulmonary:     Effort: Pulmonary effort is normal. No respiratory distress.     Breath sounds: Normal breath sounds.  Musculoskeletal:     Cervical back: Tenderness present. No swelling, edema, erythema, spasms, bony tenderness or crepitus. No pain with movement.     Thoracic back: Normal.     Lumbar back: Normal.     Right hip: Tenderness present. No deformity, bony tenderness or crepitus. Normal range of motion. Normal strength.     Left hip: Normal.     Comments: Tenderness to palpation to right-sided neck muscles.  No step-off or direct spinal tenderness.  No tenderness to palpation throughout right lower extremity.  Patient is able to bear weight.  Neurovascular intact.  Neurological:     General: No focal deficit present.     Mental Status: He is alert and oriented to person, place, and time. Mental status is at baseline.  Psychiatric:        Mood and Affect: Mood normal.        Behavior: Behavior normal.        Thought Content: Thought content normal.        Judgment: Judgment normal.     UC Treatments / Results  Labs (all labs ordered are listed, but only abnormal results are displayed) Labs Reviewed - No data to display  EKG   Radiology DG Cervical Spine 2-3 Views  Result Date: 05/06/2021 CLINICAL DATA:  Neck pain. EXAM: CERVICAL SPINE - 2-3 VIEW COMPARISON:  CT scan of the cervical spine from 2013 FINDINGS: Reversal of the normal cervical lordosis could be due to muscle spasm, pain, positioning or degenerative disease. No abnormal prevertebral soft tissue swelling. Complex appearing fracture of C2 with widening of the predental space. Recommend cervical spine CT for further evaluation. No other cervical spine fractures are  identified. Advanced degenerative cervical spondylosis with multilevel disc disease and facet disease. Some of the upper facet joints appear fused. The lung apices are grossly clear. IMPRESSION: 1. Complex fracture of C2. Recommend immobilization and CT cervical spine for further evaluation. 2. Reversal of the normal cervical lordosis. 3. Advanced degenerative cervical spondylosis with multilevel disc disease and facet disease. These results will  be called to the ordering clinician or representative by the Radiologist Assistant, and communication documented in the PACS or [employer]. Electronically Signed   By: Marijo Sanes M.D.   On: 05/06/2021 12:50   DG Hip Unilat W or Wo Pelvis 2-3 Views Right  Result Date: 05/06/2021 CLINICAL DATA:  Fall, right hip pain EXAM: DG HIP (WITH OR WITHOUT PELVIS) 2-3V RIGHT COMPARISON:  05/06/2021 FINDINGS: Osteopenia. There is no evidence of hip fracture or dislocation. Right hip joint space is preserved. Extensive atherosclerotic vascular calcifications. IMPRESSION: Negative. Electronically Signed   By: Davina Poke D.O.   On: 05/06/2021 13:04   DG Hip Unilat With Pelvis 2-3 Views Right  Result Date: 05/06/2021 CLINICAL DATA:  Fall on sidewalk 2 weeks ago, pain EXAM: DG HIP (WITH OR WITHOUT PELVIS) 2-3V RIGHT COMPARISON:  None. FINDINGS: Osteopenia. No displaced fracture of the left hip or included bony pelvis. Mild superior joint space loss and osteophytosis. Extensive vascular calcinosis. IMPRESSION: No displaced fracture of the left hip or included bony pelvis. Please note that plain radiographs are significantly insensitive for hip and pelvic fracture, particularly in the setting of osteopenia. Consider CT or MRI to more sensitively evaluate for fracture if there is high clinical concern. Electronically Signed   By: Delanna Ahmadi M.D.   On: 05/06/2021 12:39    Procedures Procedures (including critical care time)  Medications Ordered in UC Medications -  No data to display  Initial Impression / Assessment and Plan / UC Course  I have reviewed the triage vital signs and the nursing notes.  Pertinent labs & imaging results that were available during my care of the patient were reviewed by me and considered in my medical decision making (see chart for details).     Cervical spine x-ray showing complex fracture at C2.  Hip x-ray was negative but radiologist suggested further evaluation with CT scan.  Neurosurgery on-call consulted due to cervical spine fracture.  Neurosurgery on-call advised for patient to be sent to the hospital for further evaluation and management as he will need a CT scan.  Patient sent to the hospital via EMS.  C-collar applied in urgent care. Final Clinical Impressions(s) / UC Diagnoses   Final diagnoses:  Fall  Closed nondisplaced fracture of second cervical vertebra, unspecified fracture morphology, initial encounter ([place])  Right hip pain  Elevated blood pressure reading     Discharge Instructions      Patient sent to the hospital via EMS.     ED Prescriptions   None    PDMP not reviewed this encounter.   Teodora Medici, [place] 05/06/21 [place], [place], [place] 05/06/21 1429

## 2021-05-06 NOTE — ED Triage Notes (Signed)
Fell on sidewalk two weeks ago. Has had right hip and leg pain since. Has been walking on it for two weeks, but hurts him to walk. Can not get around as well as he could before the fall.

## 2021-05-06 NOTE — ED Provider Notes (Addendum)
[facility] EMERGENCY DEPARTMENT Provider Note   CSN: [id number] Arrival date & time: 05/06/21  1454     History Chief Complaint  Patient presents with   Gary Stevenson is a 80 y.o. male.  HPI Patient presents for neck pain.  This follows a fall that occurred 2 weeks ago.  At that time, he was getting out of bed of a truck.  He states that the cab was elevated off the ground and he fell striking his right hip.  He has since had pain in his right hip.  He is Previously seen at urgent care prior to arrival.  X-ray imaging did not show any right hip injury but did show concern for C2 fracture.  Cervical collar was placed and patient was transported to the ED via EMS.  Patient is unsure of how he may have injured his neck.  He denies striking his head when he fell.  He denies any other falls or recent injuries.  His daughter, who accompanies him at bedside, also states that he has not had any other falls.  Currently, the patient endorses continued pain in his neck and right hip.  Patient does continue to drink alcohol.  He states that he drinks most days.  His last alcoholic beverage was last night.  He denies any history of severe alcohol withdrawals.    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   Essential hypertension 03/17/2014   Anemia due to chronic blood loss 03/17/2014   Heme + stool 03/17/2014   Normocytic anemia 03/17/2014   COPD UNSPECIFIED 01/03/2009   DYSPNEA 01/03/2009   SWELLING/MASS/LUMP IN CHEST 01/03/2009    Past Surgical History:  Procedure Laterality Date   BRAIN SURGERY     COLONOSCOPY N/A  03/18/2014   Procedure: COLONOSCOPY;  Surgeon: Juanita Craver, MD;  Location: [facility];  Service: Endoscopy;  Laterality: N/A;   ESOPHAGOGASTRODUODENOSCOPY N/A 03/18/2014   Procedure: ESOPHAGOGASTRODUODENOSCOPY (EGD);  Surgeon: Juanita Craver, MD;  Location: [facility];  Service: Endoscopy;  Laterality: N/A;   LUNG SURGERY         No family history on file.  Social History   Tobacco Use   Smoking status: Former   Smokeless tobacco: Never  Substance Use Topics   Alcohol use: Yes    Comment: "I drink whenever I have money"   Drug use: No    Home Medications Prior to Admission medications   Medication Sig Start Date End Date Taking? Authorizing Provider  indomethacin (INDOCIN) 50 MG capsule Take 1 capsule (50 mg total) by mouth 2 (two) times daily as needed. 02/19/21   Volney American, PA-C  meloxicam (MOBIC) 15 MG tablet Take 1 tablet (15 mg total) by mouth daily. Patient not taking: Reported on 02/19/2021 12/17/20   Wallene Huh, DPM  prednisoLONE acetate (PRED FORTE) 1 % ophthalmic suspension SMARTSIG:1 Drop(s) Right Eye Morning-Night Patient not taking: Reported on 02/19/2021 10/09/20   [provider]    Allergies    Patient has no known allergies.  Review of Systems   Review of Systems  Constitutional:  Negative for activity change, chills, fatigue and fever.  HENT:  Negative for ear pain and sore throat.   Eyes:  Negative for pain and visual disturbance.  Respiratory:  Negative for cough and shortness of breath.   Cardiovascular:  Negative for chest pain and palpitations.  Gastrointestinal:  Negative for abdominal pain, diarrhea, nausea and vomiting.  Genitourinary:  Negative for dysuria and hematuria.  Musculoskeletal:  Positive for arthralgias and neck pain. Negative for back pain, joint swelling and myalgias.  Skin:  Negative for color change, rash and wound.  Neurological:  Positive for weakness. Negative for dizziness, seizures, syncope,  light-headedness, numbness and headaches.  Hematological:  Does not bruise/bleed easily.  Psychiatric/Behavioral:  Negative for confusion and decreased concentration.   All other systems reviewed and are negative.  Physical Exam Updated Vital Signs BP 138/88   Pulse 79   Temp 98 F (36.7 C)   Resp (!) 22   SpO2 98%   Physical Exam Vitals and nursing note reviewed.  Constitutional:      General: He is not in acute distress.    Appearance: Normal appearance. He is well-developed. He is not ill-appearing, toxic-appearing or diaphoretic.  HENT:     Head: Normocephalic and atraumatic.     Right Ear: External ear normal.     Left Ear: External ear normal.     Nose: Nose normal.  Eyes:     Conjunctiva/sclera: Conjunctivae normal.  Neck:     Comments: [place] J collar in place Cardiovascular:     Rate and Rhythm: Normal rate and regular rhythm.     Heart sounds: No murmur heard. Pulmonary:     Effort: Pulmonary effort is normal. No respiratory distress.     Breath sounds: Normal breath sounds. No wheezing or rales.  Chest:     Chest wall: No tenderness.  Abdominal:     Palpations: Abdomen is soft.     Tenderness: There is no abdominal tenderness.  Musculoskeletal:     Cervical back: Neck supple.  Skin:    General: Skin is warm and dry.     Coloration: Skin is not jaundiced or pale.  Neurological:     Mental Status: He is alert and oriented to person, place, and time.     Cranial Nerves: No cranial nerve deficit.     Sensory: No sensory deficit.     Motor: Weakness (3/5 strength with right hip flexion, 4/5 strength with right knee extension.  Motor function otherwise intact.) present.  Psychiatric:        Mood and Affect: Mood normal.        Behavior: Behavior normal.        Thought Content: Thought content normal.        Judgment: Judgment normal.    ED Results / Procedures / Treatments   Labs (all labs ordered are listed, but only abnormal results are displayed) Labs  Reviewed  COMPREHENSIVE METABOLIC PANEL - Abnormal; Notable for the following components:      Result Value   Sodium 134 (*)    Glucose, Bld 106 (*)    BUN 43 (*)    Creatinine, Ser 1.61 (*)    Calcium 8.8 (*)    Albumin 2.6 (*)    GFR, Estimated 43 (*)    All other components within normal limits  CBC - Abnormal; Notable for the following components:   RBC 3.64 (*)    Hemoglobin 10.6 (*)    HCT 33.5 (*)  RDW 18.6 (*)    Platelets 407 (*)    All other components within normal limits  URINALYSIS, ROUTINE W REFLEX MICROSCOPIC - Abnormal; Notable for the following components:   APPearance HAZY (*)    Hgb urine dipstick SMALL (*)    Protein, ur 100 (*)    Leukocytes,Ua TRACE (*)    All other components within normal limits  I-STAT CHEM 8, ED - Abnormal; Notable for the following components:   BUN 38 (*)    Creatinine, Ser 1.90 (*)    Glucose, Bld 102 (*)    Hemoglobin 11.2 (*)    HCT 33.0 (*)    All other components within normal limits  RESP PANEL BY RT-PCR (FLU A&B, COVID) ARPGX2  ETHANOL  LACTIC ACID, PLASMA  PROTIME-INR    EKG EKG Interpretation  Date/Time:  Wednesday May 06 2021 15:18:23 EST Ventricular Rate:  67 PR Interval:  122 QRS Duration: 88 QT Interval:  432 QTC Calculation: 456 R Axis:   47 Text Interpretation: Normal sinus rhythm Nonspecific T wave abnormality Abnormal ECG Confirmed by Godfrey Pick [phone]) on 05/06/2021 3:26:05 PM  Radiology DG Cervical Spine 2-3 Views  Result Date: 05/06/2021 CLINICAL DATA:  Neck pain. EXAM: CERVICAL SPINE - 2-3 VIEW COMPARISON:  CT scan of the cervical spine from 2013 FINDINGS: Reversal of the normal cervical lordosis could be due to muscle spasm, pain, positioning or degenerative disease. No abnormal prevertebral soft tissue swelling. Complex appearing fracture of C2 with widening of the predental space. Recommend cervical spine CT for further evaluation. No other cervical spine fractures are identified. Advanced  degenerative cervical spondylosis with multilevel disc disease and facet disease. Some of the upper facet joints appear fused. The lung apices are grossly clear. IMPRESSION: 1. Complex fracture of C2. Recommend immobilization and CT cervical spine for further evaluation. 2. Reversal of the normal cervical lordosis. 3. Advanced degenerative cervical spondylosis with multilevel disc disease and facet disease. These results will be called to the ordering clinician or representative by the Radiologist Assistant, and communication documented in the PACS or [employer]. Electronically Signed   By: Marijo Sanes M.D.   On: 05/06/2021 12:50   CT CERVICAL SPINE WO CONTRAST  Result Date: 05/06/2021 CLINICAL DATA:  [place] 2 weeks ago.  C2 fracture shown by radiography. EXAM: CT CERVICAL SPINE WITHOUT CONTRAST TECHNIQUE: Multidetector CT imaging of the cervical spine was performed without intravenous contrast. Multiplanar CT image reconstructions were also generated. COMPARISON:  Radiography same day FINDINGS: Alignment: No malalignment. Skull base and vertebrae: Clivus is not included on the scan. No C1 fracture. There are pronounced arthritic changes of the C1-2 articulation, more on the right than the left, with some cystic change. There is a C2 fracture extending primarily in the coronal plane from the base of the dens down through the vertebral body to the L2-3 disc. No displacement of the dens. No malalignment. No other regional fracture. Soft tissues and spinal canal: No soft tissue finding other than atherosclerotic calcification at the carotid bifurcation regions. Disc levels:  Pronounced arthritic changes at C1-2 as noted above. C2-3: Chronic facet fusion.  No stenosis. C3-4: Chronic facet fusion.  No stenosis. C4-5: Mild disc bulge and facet degeneration.  No stenosis. C5-6: Mild disc bulge. Facet degeneration worse on the left. Mild bony foraminal narrowing on the left. C6-7: Chronic fusion across the disc  space. Mild chronic bilateral bony foraminal narrowing. C7-T1: Facet osteoarthritis worse on the left. No canal or foraminal stenosis. Upper chest:  Negative Other: None IMPRESSION: C2 fracture extending from the base of the dens in the coronal plane all the way down to the C2-3 disc level. No displacement. Pronounced chronic arthropathy of the C1-2 articulation, right worse than left. Chronic cervical fusion of the facets from C2 through C4. Chronic interbody fusion at C6-7. These results were called by telephone at the time of interpretation on 05/06/2021 at 5:51 pm to provider Godfrey Pick , who verbally acknowledged these results. Electronically Signed   By: Nelson Chimes M.D.   On: 05/06/2021 17:54   CT Thoracic Spine Wo Contrast  Result Date: 05/06/2021 CLINICAL DATA:  Fall with back pain EXAM: CT THORACIC SPINE WITHOUT CONTRAST TECHNIQUE: Multidetector CT images of the thoracic were obtained using the standard protocol without intravenous contrast. COMPARISON:  None. FINDINGS: Alignment: Mild scoliotic curvature. Vertebrae: Old superior endplate fracture at B51. No acute thoracic region fracture. Paraspinal and other soft tissues: Negative Disc levels: No significant disc level pathology. No apparent stenosis of the canal or foramina. IMPRESSION: No acute thoracic region finding. Old superior endplate fracture at W25. Mild scoliotic curvature. Electronically Signed   By: Nelson Chimes M.D.   On: 05/06/2021 18:10   CT Lumbar Spine Wo Contrast  Result Date: 05/06/2021 CLINICAL DATA:  [place] 2 weeks ago.  Back pain.  Acute C2 fracture. EXAM: CT LUMBAR SPINE WITHOUT CONTRAST TECHNIQUE: Multidetector CT imaging of the lumbar spine was performed without intravenous contrast administration. Multiplanar CT image reconstructions were also generated. COMPARISON:  None. FINDINGS: Segmentation: 5 lumbar type vertebral bodies assumed. Alignment: No malalignment. Vertebrae: Old minor anterior compression fracture at L1  with loss of height of 25%. Suspicion of a minimal acute superior endplate fracture at L4. Acute superior endplate compression fracture at L5 with loss of height of 10%. Paraspinal and other soft tissues: Negative other than aortic atherosclerosis. Disc levels: Mild multifactorial spinal stenosis at L3-4. Moderate multifactorial spinal stenosis at L4-5 and L5-S1 due to shallow disc protrusions in combination with facet and ligamentous hypertrophy. IMPRESSION: Minimal acute superior endplate fractures at L4 and L5 with loss of height no more than 10%. No retropulsed bone. Moderate multifactorial stenosis at L4-5 and L5-S1. Electronically Signed   By: Nelson Chimes M.D.   On: 05/06/2021 18:08   MR LUMBAR SPINE WO CONTRAST  Result Date: 05/06/2021 CLINICAL DATA:  Trauma.  Abnormal neurological exam.  Abnormal CT. EXAM: MRI LUMBAR SPINE WITHOUT CONTRAST TECHNIQUE: Multiplanar, multisequence MR imaging of the lumbar spine was performed. No intravenous contrast was administered. COMPARISON:  CT same day FINDINGS: Segmentation: 5 lumbar type vertebral bodies as numbered previously. Alignment:  No malalignment. Vertebrae: The study confirms acute compression fractures at L4 and L5 with loss of height of only about 10%. Old healed compression deformity at L1. Conus medullaris and cauda equina: Conus extends to the L1 level. Conus and cauda equina appear normal. Paraspinal and other soft tissues: Negative Disc levels: Minimal non-compressive disc bulges at L2-3 and above. L3-4: Bulging of the disc. Mild facet and ligamentous hypertrophy. Mild narrowing of the lateral recesses but no definite neural compression. L4-5: Bulging of the disc. Facet and ligamentous hypertrophy. Moderate multifactorial stenosis. L5-S1: Bulging of the disc. Facet and ligamentous hypertrophy. Stenosis of the lateral recesses and neural foramina. IMPRESSION: Confirmation of acute compression fractures at L4 and L5, with loss of height of only about  10%. Lateral recess and foraminal stenosis at L4-5 and L5-S1 that appear chronic but could be symptomatic. Facet arthropathy at those levels could  also be painful. Electronically Signed   By: Nelson Chimes M.D.   On: 05/06/2021 20:51   DG Hip Unilat W or Wo Pelvis 2-3 Views Right  Result Date: 05/06/2021 CLINICAL DATA:  Fall, right hip pain EXAM: DG HIP (WITH OR WITHOUT PELVIS) 2-3V RIGHT COMPARISON:  05/06/2021 FINDINGS: Osteopenia. There is no evidence of hip fracture or dislocation. Right hip joint space is preserved. Extensive atherosclerotic vascular calcifications. IMPRESSION: Negative. Electronically Signed   By: Davina Poke D.O.   On: 05/06/2021 13:04   DG Hip Unilat With Pelvis 2-3 Views Right  Result Date: 05/06/2021 CLINICAL DATA:  Fall on sidewalk 2 weeks ago, pain EXAM: DG HIP (WITH OR WITHOUT PELVIS) 2-3V RIGHT COMPARISON:  None. FINDINGS: Osteopenia. No displaced fracture of the left hip or included bony pelvis. Mild superior joint space loss and osteophytosis. Extensive vascular calcinosis. IMPRESSION: No displaced fracture of the left hip or included bony pelvis. Please note that plain radiographs are significantly insensitive for hip and pelvic fracture, particularly in the setting of osteopenia. Consider CT or MRI to more sensitively evaluate for fracture if there is high clinical concern. Electronically Signed   By: Delanna Ahmadi M.D.   On: 05/06/2021 12:39    Procedures Procedures   Medications Ordered in ED Medications  acetaminophen (TYLENOL) tablet 1,000 mg (1,000 mg Oral Given 05/06/21 1841)  ketorolac (TORADOL) injection 30 mg (30 mg Intramuscular Given 05/06/21 2149)    ED Course  I have reviewed the triage vital signs and the nursing notes.  Pertinent labs & imaging results that were available during my care of the patient were reviewed by me and considered in my medical decision making (see chart for details).    MDM Rules/Calculators/A&P                           Patient is 80 year old male who presents following evaluation urgent care.  Urgent care x-ray imaging showed concern for C2 fracture.  Results of hip x-rays were negative.  He was placed in a [place] J collar and transported to the ED.  Currently, he will endorses mild pain in the areas of his neck and right hip.  Physical exam is notable for decreased strength in right hip flexion and right knee extension.  He denies any decrease sensation in his extremities.  CT of spine was ordered.  Laboratory work-up was initiated in anticipation of possible hospital admission.  Lab work showed elevated creatinine and decreased hemoglobin, when compared to labs from 7 years ago.  Due to absence of recent lab work, chronicity of kidney disease and anemia is unknown.  CT imaging confirmed a C2 fracture at the base of the dens.  This is not displaced.  Additional findings on CT imaging showed minimal acute superior endplate fractures of L4 and L5.  Given the patient has new injuries and does appear to have some weakness in his right leg, MRI of lumbar spine was ordered.  MRI showed chronic stenoses only.  Patient's right leg symptoms likely related to pain, given that this is the hip that he injured during his fall.  Patient's injuries were discussed with neurosurgery.  They recommended continued use of hard collar and follow-up in 2 weeks.  Patient was provided follow-up contact information.  He was discharged in stable condition.  Final Clinical Impression(s) / ED Diagnoses Final diagnoses:  Closed nondisplaced fracture of second cervical vertebra, unspecified fracture morphology, initial encounter ([place])  Rx / DC Orders ED Discharge Orders     None        Godfrey Pick, MD 05/07/21 Adelfa Koh    Godfrey Pick, MD 05/07/21 [phone]

## 2021-05-06 NOTE — Discharge Instructions (Signed)
Patient sent to the hospital via EMS. 

## 2021-05-27 ENCOUNTER — Other Ambulatory Visit: Payer: Self-pay

## 2021-05-27 ENCOUNTER — Encounter (HOSPITAL_COMMUNITY): Payer: Self-pay

## 2021-05-27 ENCOUNTER — Emergency Department (HOSPITAL_COMMUNITY)
Admission: EM | Admit: 2021-05-27 | Discharge: 2021-05-27 | Disposition: A | Discharge status: Home / Self Care | Payer: Medicare HMO | Attending: Emergency Medicine | Admitting: Emergency Medicine

## 2021-05-27 ENCOUNTER — Emergency Department (HOSPITAL_COMMUNITY): Payer: Medicare HMO

## 2021-05-27 DIAGNOSIS — M25551 Pain in right hip: Secondary | ICD-10-CM | POA: Insufficient documentation

## 2021-05-27 DIAGNOSIS — Z87891 Personal history of nicotine dependence: Secondary | ICD-10-CM | POA: Diagnosis not present

## 2021-05-27 DIAGNOSIS — J449 Chronic obstructive pulmonary disease, unspecified: Secondary | ICD-10-CM | POA: Insufficient documentation

## 2021-05-27 DIAGNOSIS — W1789XA Other fall from one level to another, initial encounter: Secondary | ICD-10-CM | POA: Diagnosis not present

## 2021-05-27 DIAGNOSIS — W19XXXD Unspecified fall, subsequent encounter: Secondary | ICD-10-CM

## 2021-05-27 DIAGNOSIS — Y92812 Truck as the place of occurrence of the external cause: Secondary | ICD-10-CM | POA: Insufficient documentation

## 2021-05-27 DIAGNOSIS — S72001A Fracture of unspecified part of neck of right femur, initial encounter for closed fracture: Secondary | ICD-10-CM | POA: Diagnosis not present

## 2021-05-27 DIAGNOSIS — I10 Essential (primary) hypertension: Secondary | ICD-10-CM | POA: Diagnosis not present

## 2021-05-27 DIAGNOSIS — M4328 Fusion of spine, sacral and sacrococcygeal region: Secondary | ICD-10-CM | POA: Diagnosis not present

## 2021-05-27 DIAGNOSIS — M542 Cervicalgia: Secondary | ICD-10-CM | POA: Diagnosis not present

## 2021-05-27 DIAGNOSIS — S32050A Wedge compression fracture of fifth lumbar vertebra, initial encounter for closed fracture: Secondary | ICD-10-CM | POA: Diagnosis not present

## 2021-05-27 DIAGNOSIS — S72091A Other fracture of head and neck of right femur, initial encounter for closed fracture: Secondary | ICD-10-CM | POA: Diagnosis not present

## 2021-05-27 DIAGNOSIS — S129XXA Fracture of neck, unspecified, initial encounter: Secondary | ICD-10-CM | POA: Diagnosis not present

## 2021-05-27 DIAGNOSIS — Z85118 Personal history of other malignant neoplasm of bronchus and lung: Secondary | ICD-10-CM | POA: Insufficient documentation

## 2021-05-27 DIAGNOSIS — S32511A Fracture of superior rim of right pubis, initial encounter for closed fracture: Secondary | ICD-10-CM | POA: Diagnosis not present

## 2021-05-27 DIAGNOSIS — S32491A Other specified fracture of right acetabulum, initial encounter for closed fracture: Secondary | ICD-10-CM | POA: Diagnosis not present

## 2021-05-27 DIAGNOSIS — M25461 Effusion, right knee: Secondary | ICD-10-CM | POA: Diagnosis not present

## 2021-05-27 IMAGING — CR DG HIP (WITH OR WITHOUT PELVIS) 2-3V*R*
3 series · 3 of 3 positions shown · non-contrast
Comparison: [DATE]

CLINICAL DATA: Fell [DATE].  Right hip pain.

EXAM:
DG HIP (WITH OR WITHOUT PELVIS) 2-3V RIGHT

[t pelvis ap]
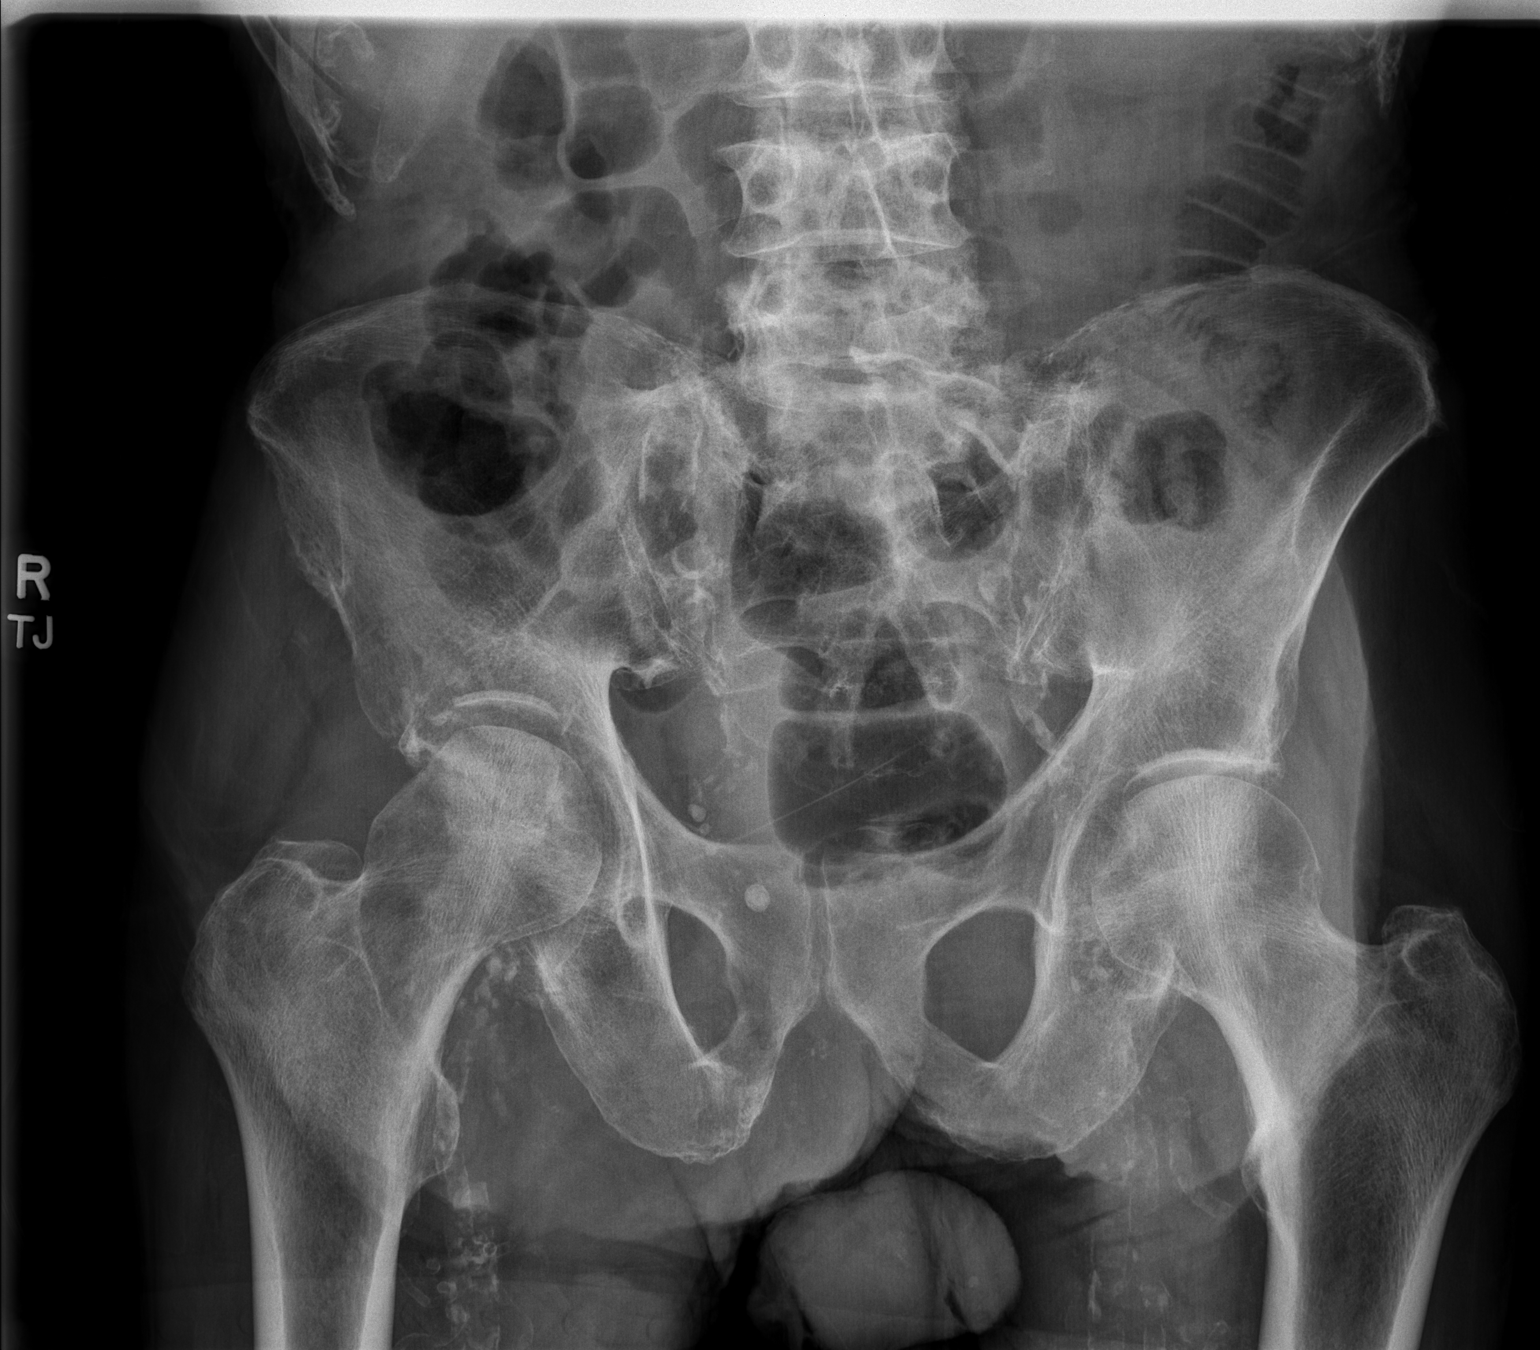

[t hip ap right]
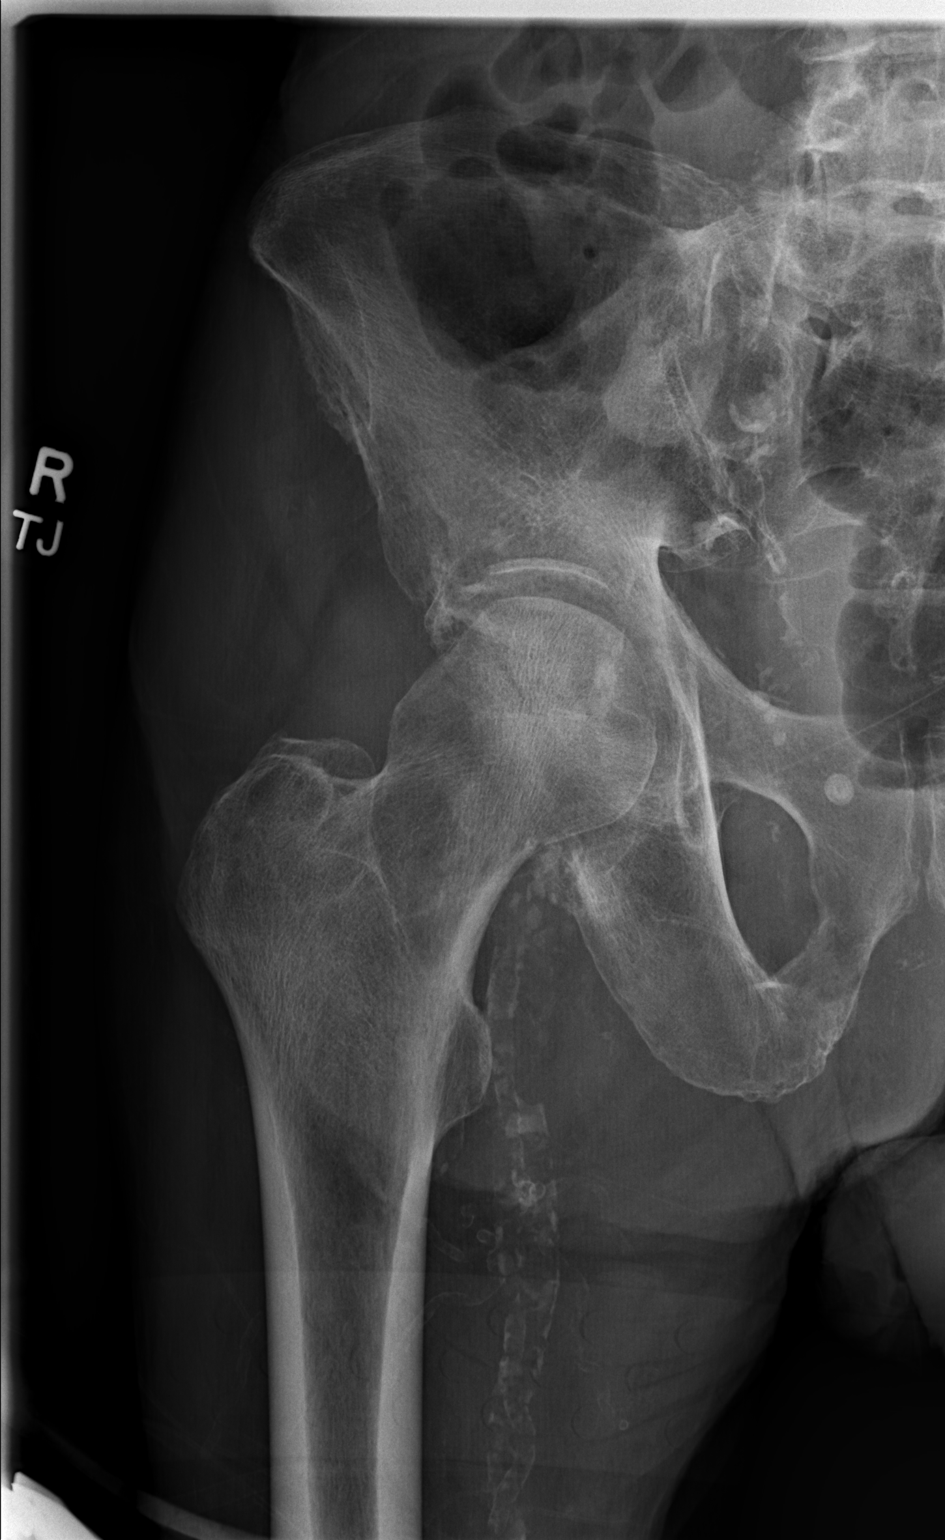

[t hip frog leg right]
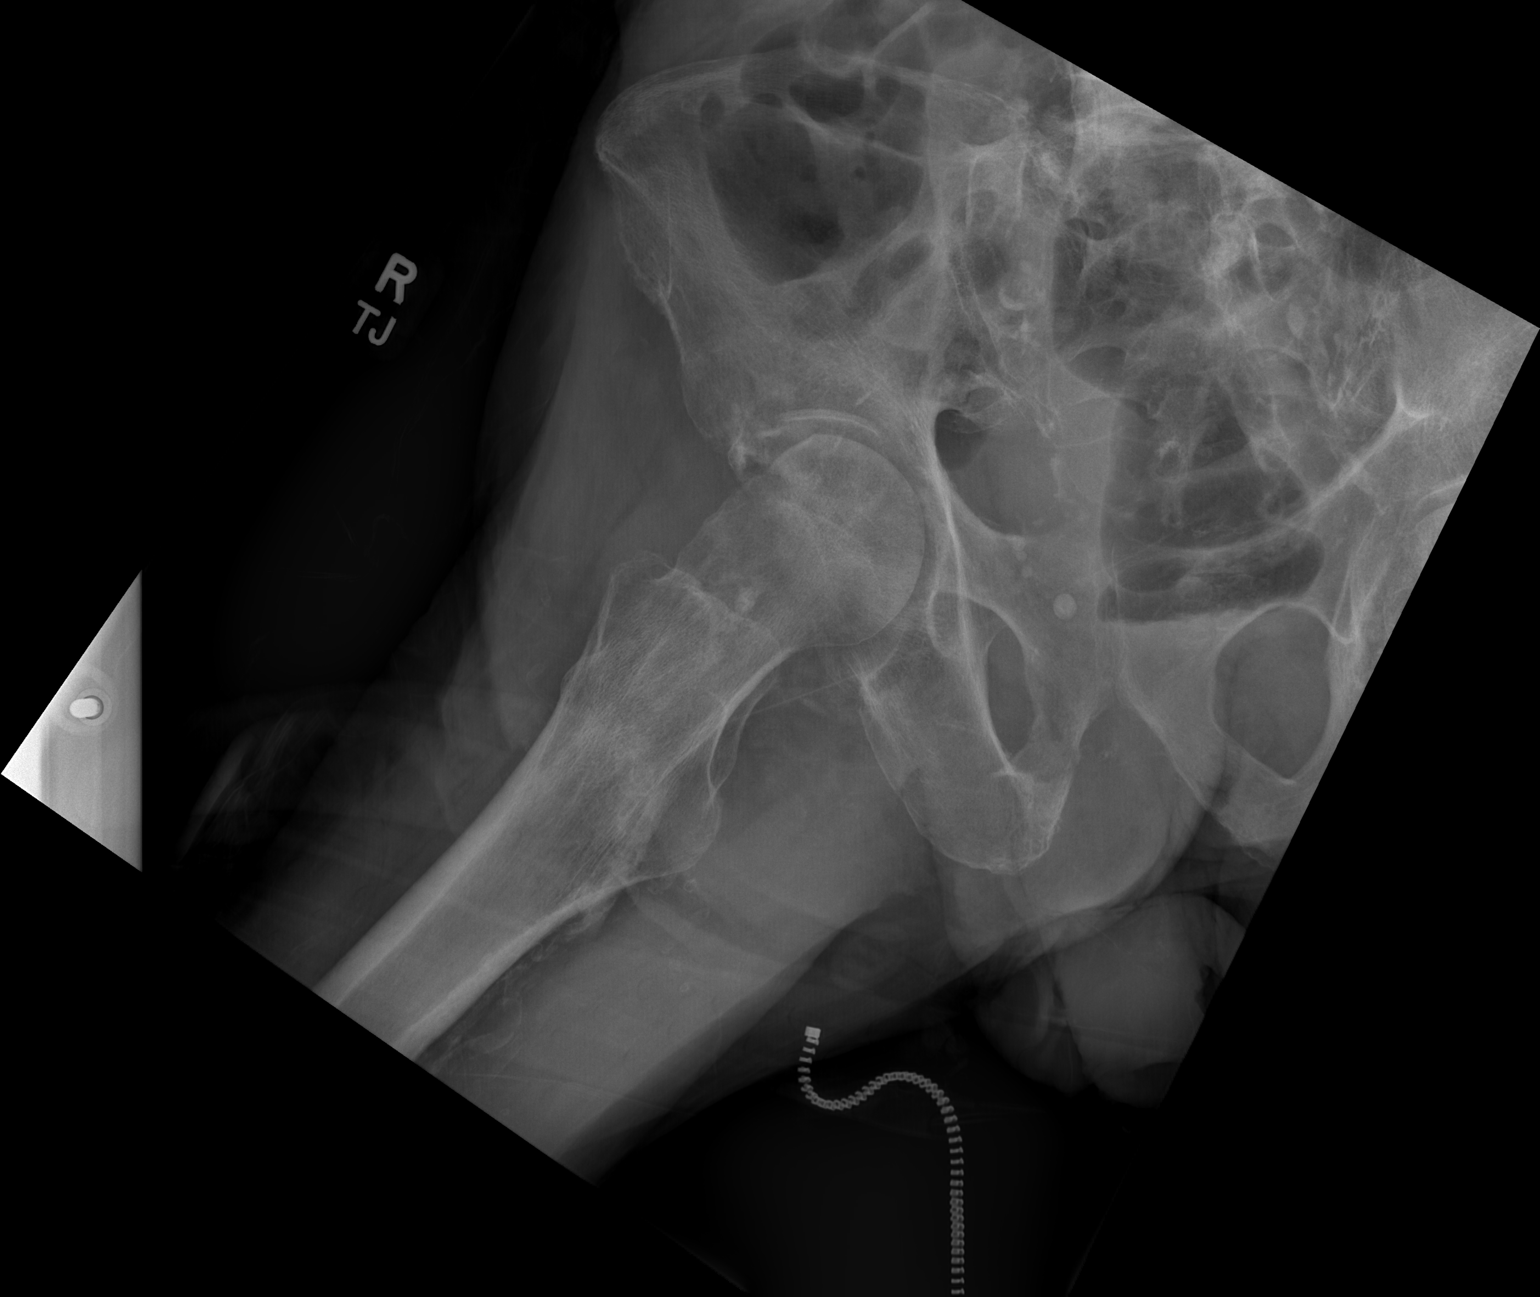

[3 of 3 positions shown; findings below may reference images not displayed]

FINDINGS: There is a fracture of the right lateral acetabulum and the right
femoral head with lateral flattening deformity. These findings were
not visible on the previous radiography. The fractures are somewhat
unusual and CT might be considered for confirmation and better
depiction. No other fracture of the pelvic bones is identified. No
sign of left hip fracture.
IMPRESSION: Interval demonstration of a fracture of the right acetabulum
laterally with fracture of the right femoral head with lateral
flattening deformity. These findings were not visible on the study
of 21 days ago. Consider CT scan of the right hip for better detail
if intervention is contemplated.

## 2021-05-27 IMAGING — CR DG KNEE 1-2V PORT*R*
2 series · 2 of 2 positions shown · non-contrast
Comparison: None.

CLINICAL DATA: Cervical spine fracture.  Knee pain.

EXAM:
PORTABLE RIGHT KNEE - 1-2 VIEW

[x knee ap right]
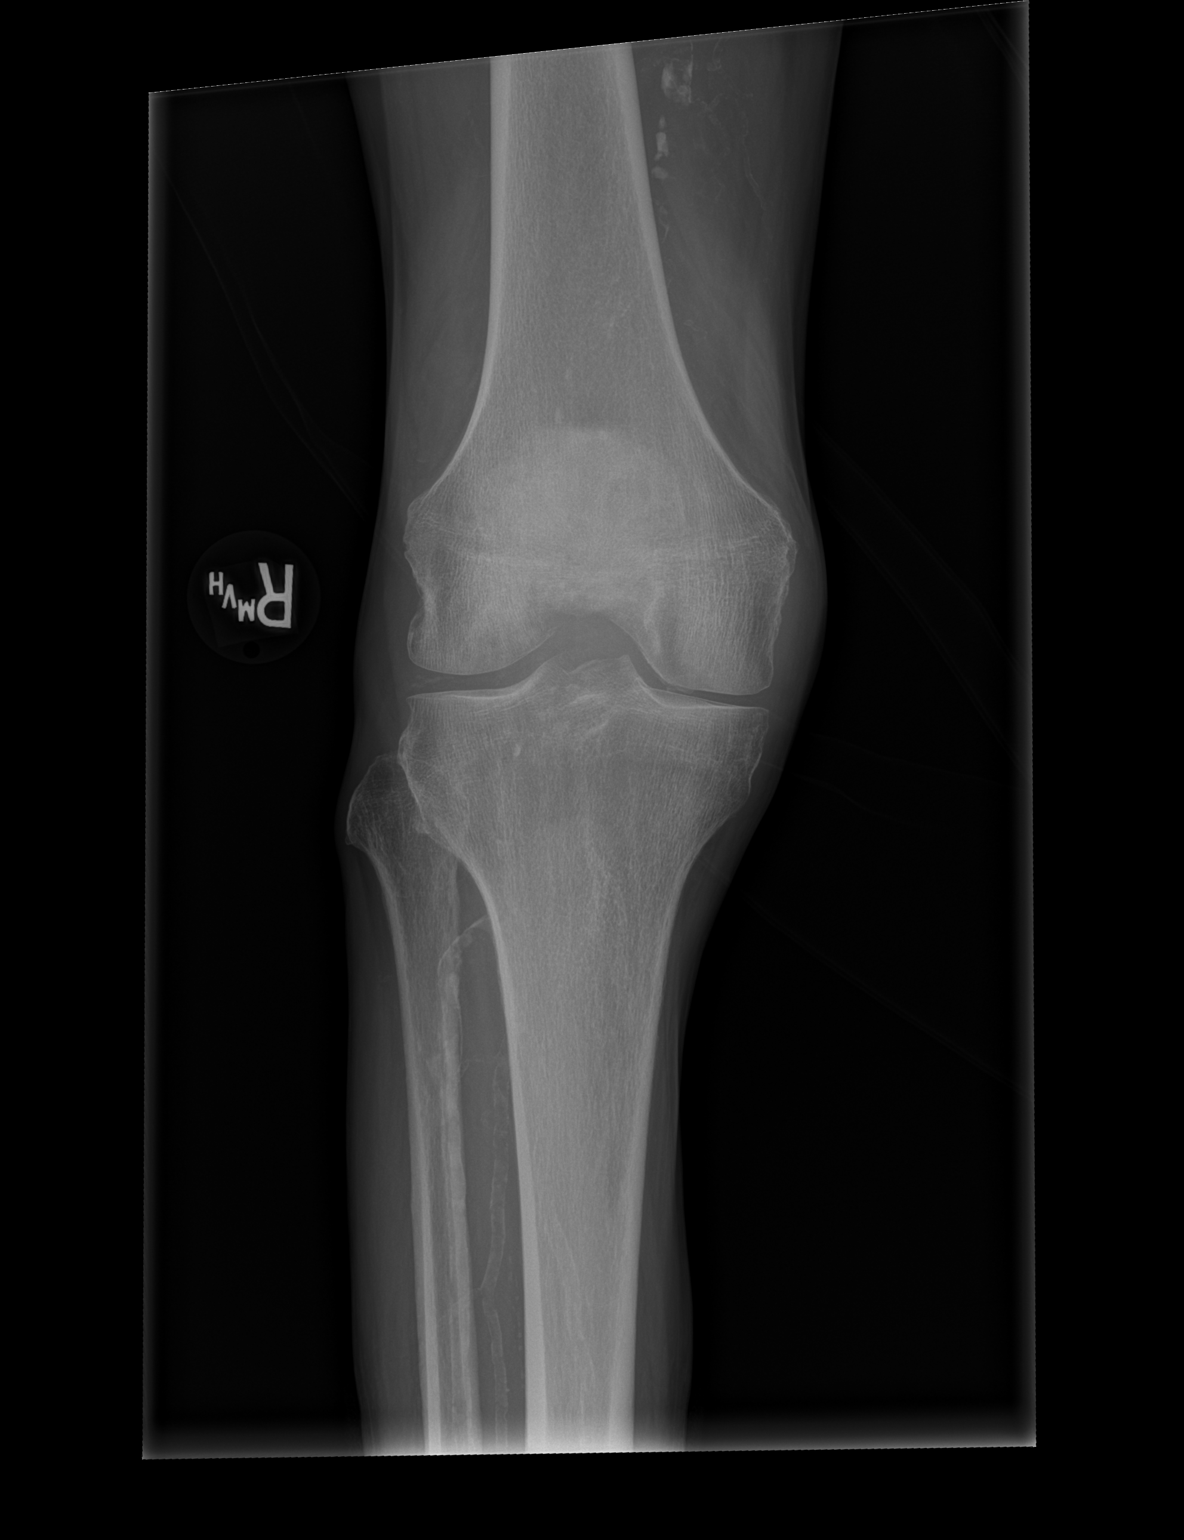

[x knee lat right]
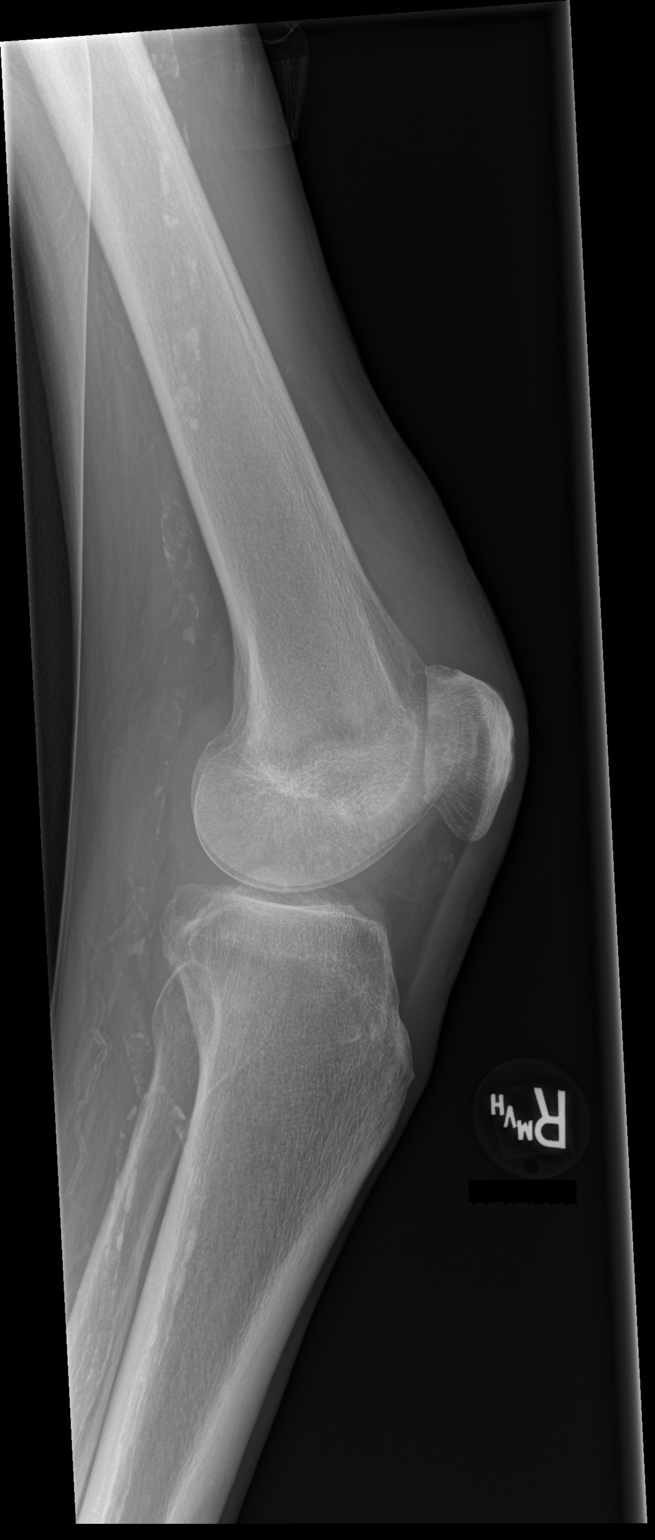

[2 of 2 positions shown; findings below may reference images not displayed]

FINDINGS: The mineralization and alignment are normal. There is no evidence of
acute fracture or dislocation. Mild joint space narrowing in the
medial and patellofemoral compartments. There is meniscal
chondrocalcinosis, a small joint effusion and prominent vascular
calcifications.
IMPRESSION: No acute osseous findings. Small joint effusion with prominent
vascular calcifications.

## 2021-05-27 MED ORDER — HYDROCODONE-ACETAMINOPHEN 5-325 MG PO TABS
1.0000 | ORAL_TABLET | Freq: Once | ORAL | Status: AC
  Administered 2021-05-27: 1 via ORAL
  Filled 2021-05-27: qty 1

## 2021-05-27 MED ORDER — HYDROCODONE-ACETAMINOPHEN 5-325 MG PO TABS: 1.0000 | ORAL_TABLET | Freq: Four times a day (QID) | ORAL | 0 refills | Status: AC | PRN

## 2021-05-27 NOTE — ED Provider Notes (Signed)
CT scan of the right hip reviewed by Dr. Lyla Glassing from orthopedics.  States that patient can weight-bear on this will probably need some assistance at home.  Patient has a walker.  Patient will need some pain medication.  Will need to follow-up with their group in 1 to 2 weeks.  Patient already has follow-up with neurosurgery coming up in the next few days for the C2 fracture.  Patient told he needs to wear his hard collar.  And patient has a new finding of an endplate L5 fracture which neurosurgery can follow that as well.  We will provide patient with hydrocodone for pain.   Fredia Sorrow, MD 05/27/21 1843

## 2021-05-27 NOTE — ED Provider Notes (Signed)
[facility] DEPT Provider Note   CSN: [id number] Arrival date & time: 05/27/21  0946     History Chief Complaint  Patient presents with   Neck Pain    Gary Stevenson is a 80 y.o. male.   Neck Pain  Patient presents to the emergency room for evaluation of persistent neck and right hip pain.  Patient was seen in the emergency room on November 9 after having had a fall from a raised truck bed.  Patient was complaining of neck and hip pain.  He was initially evaluated in urgent care and then was transferred to the emergency room.  Patient had an extensive evaluation including CT scans and MRIs of the spine.  Patient was found to have a C2 fracture.  N neurosurgery was consulted and recommendation was for cervical spine fracture and outpatient follow-up.  Patient was not discharged with any medications for pain.  Family did call the spine surgery.  They have an appointment scheduled for next month.  He continues to have pain so he came to the ED today.  Patient also has having persistent right hip pain.  His initial x-rays were negative.  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   Essential hypertension 03/17/2014   Anemia due to chronic blood loss 03/17/2014   Heme + stool 03/17/2014   Normocytic anemia 03/17/2014   COPD UNSPECIFIED 01/03/2009   DYSPNEA 01/03/2009   SWELLING/MASS/LUMP IN CHEST 01/03/2009    Past Surgical History:  Procedure Laterality Date   BRAIN SURGERY     COLONOSCOPY N/A 03/18/2014    Procedure: COLONOSCOPY;  Surgeon: Juanita Craver, MD;  Location: [facility] ENDOSCOPY;  Service: Endoscopy;  Laterality: N/A;   ESOPHAGOGASTRODUODENOSCOPY N/A 03/18/2014   Procedure: ESOPHAGOGASTRODUODENOSCOPY (EGD);  Surgeon: Juanita Craver, MD;  Location: [facility] ENDOSCOPY;  Service: Endoscopy;  Laterality: N/A;   LUNG SURGERY         History reviewed. No pertinent family history.  Social History   Tobacco Use   Smoking status: Former   Smokeless tobacco: Never  Substance Use Topics   Alcohol use: Yes    Comment: "I drink whenever I have money"   Drug use: No    Home Medications Prior to Admission medications   Medication Sig Start Date End Date Taking? Authorizing Provider  indomethacin (INDOCIN) 50 MG capsule Take 1 capsule (50 mg total) by mouth 2 (two) times daily as needed. 02/19/21   Volney American, PA-C  meloxicam (MOBIC) 15 MG tablet Take 1 tablet (15 mg total) by mouth daily. Patient not taking: Reported on 02/19/2021 12/17/20   Wallene Huh, DPM  prednisoLONE acetate (PRED FORTE) 1 % ophthalmic suspension SMARTSIG:1 Drop(s) Right Eye Morning-Night Patient not taking: Reported on 02/19/2021 10/09/20   [provider]    Allergies    Patient has no known allergies.  Review of Systems   Review of Systems  Musculoskeletal:  Positive for neck pain.  All other systems reviewed and are negative.  Physical Exam Updated  Vital Signs BP (!) 150/86   Pulse 76   Temp 98.2 F (36.8 C) (Oral)   Resp 18   Ht 1.676 m (5\' 6" )   SpO2 99%   BMI 20.32 kg/m   Physical Exam Vitals and nursing note reviewed.  Constitutional:      General: He is not in acute distress.    Appearance: He is well-developed.  HENT:     Head: Normocephalic and atraumatic.     Right Ear: External ear normal.     Left Ear: External ear normal.  Eyes:     General: No scleral icterus.       Right eye: No discharge.        Left eye: No discharge.     Conjunctiva/sclera: Conjunctivae normal.   Neck:     Trachea: No tracheal deviation.  Cardiovascular:     Rate and Rhythm: Normal rate and regular rhythm.  Pulmonary:     Effort: Pulmonary effort is normal. No respiratory distress.     Breath sounds: Normal breath sounds. No stridor. No wheezing or rales.  Abdominal:     General: Bowel sounds are normal. There is no distension.     Palpations: Abdomen is soft.     Tenderness: There is no abdominal tenderness. There is no guarding or rebound.  Musculoskeletal:        General: Tenderness present. No deformity.     Cervical back: Neck supple.     Comments: Soft collar in place, mild tenderness palpation right hip  Skin:    General: Skin is warm and dry.     Findings: No rash.  Neurological:     General: No focal deficit present.     Mental Status: He is alert.     Cranial Nerves: No cranial nerve deficit (no facial droop, extraocular movements intact, no slurred speech).     Sensory: No sensory deficit.     Motor: No abnormal muscle tone or seizure activity.     Coordination: Coordination normal.  Psychiatric:        Mood and Affect: Mood normal.    ED Results / Procedures / Treatments   Labs (all labs ordered are listed, but only abnormal results are displayed) Labs Reviewed - No data to display  EKG None  Radiology CT HIP RIGHT WO CONTRAST  Result Date: 05/27/2021 CLINICAL DATA:  Right hip and acetabular fractures. Follow up proximally 1 month ago with superior endplate compression fractures at L4 and L5. EXAM: CT OF THE RIGHT HIP WITHOUT CONTRAST TECHNIQUE: Multidetector CT imaging of the right hip was performed according to the standard protocol. Multiplanar CT image reconstructions were also generated. COMPARISON:  CT lumbar spine 05/06/2021. MRI lumbar spine 05/06/2021. Hip radiographs 05/06/2021 and 05/27/2021. FINDINGS: Bones/Joint/Cartilage The right femoral head is located. There is a mild impaction fracture of the femoral head superolaterally. The femoral  neck and intertrochanteric regions appear intact. In addition, there is a comminuted right acetabular fracture which is mildly displaced, involving the roof, anterior and medial walls of the acetabulum. There is a moderate-sized hip joint effusion, and several intra-articular fracture fragments are present. There are nondisplaced fractures of the right superior and inferior pubic rami which appears subacute. The symphysis pubis and visualized right sacroiliac joint are intact. There is partial ankylosis of the sacroiliac joint. Superior endplate compression fracture at L5 has mildly progressed from 05/06/2021. Ligaments Suboptimally assessed by CT. Muscles and Tendons Mild generalized muscular atrophy with more focal involvement of the gluteus  musculature. No focal intramuscular hematoma identified. There is chronic hamstring tendinosis with calcification. Soft tissues No evidence of periarticular hematoma. Diffuse iliofemoral atherosclerosis. IMPRESSION: 1. Comminuted and mildly displaced intra-articular fracture of the right acetabulum. 2. Mild impaction fracture of the femoral head superior laterally. The femoral neck appears intact. 3. Nondisplaced right pubic rami fractures, likely subacute. Known fracture involving the superior endplate of L5 has mildly progressed from earlier this month. 4. Right hip joint effusion with intra-articular fracture fragments. 5. No significant periarticular hematoma. Electronically Signed   By: Richardean Sale M.D.   On: 05/27/2021 16:12   DG Knee Right Port  Result Date: 05/27/2021 CLINICAL DATA:  Cervical spine fracture.  Knee pain. EXAM: PORTABLE RIGHT KNEE - 1-2 VIEW COMPARISON:  None. FINDINGS: The mineralization and alignment are normal. There is no evidence of acute fracture or dislocation. Mild joint space narrowing in the medial and patellofemoral compartments. There is meniscal chondrocalcinosis, a small joint effusion and prominent vascular calcifications.  IMPRESSION: No acute osseous findings. Small joint effusion with prominent vascular calcifications. Electronically Signed   By: Richardean Sale M.D.   On: 05/27/2021 15:16   DG Hip Unilat W or Wo Pelvis 2-3 Views Right  Result Date: 05/27/2021 CLINICAL DATA:  [place] 05/06/2021.  Right hip pain. EXAM: DG HIP (WITH OR WITHOUT PELVIS) 2-3V RIGHT COMPARISON:  05/06/2021 FINDINGS: There is a fracture of the right lateral acetabulum and the right femoral head with lateral flattening deformity. These findings were not visible on the previous radiography. The fractures are somewhat unusual and CT might be considered for confirmation and better depiction. No other fracture of the pelvic bones is identified. No sign of left hip fracture. IMPRESSION: Interval demonstration of a fracture of the right acetabulum laterally with fracture of the right femoral head with lateral flattening deformity. These findings were not visible on the study of 21 days ago. Consider CT scan of the right hip for better detail if intervention is contemplated. Electronically Signed   By: Nelson Chimes M.D.   On: 05/27/2021 13:55    Procedures Procedures   Medications Ordered in ED Medications  HYDROcodone-acetaminophen (NORCO/VICODIN) 5-325 MG per tablet 1 tablet (1 tablet Oral Given 05/27/21 1249)    ED Course  I have reviewed the triage vital signs and the nursing notes.  Pertinent labs & imaging results that were available during my care of the patient were reviewed by me and considered in my medical decision making (see chart for details).  Clinical Course as of 05/27/21 1808  Wed May 27, 2021  1423 Hip xray shows new possible acetabular fracture. D/w Dr Lyla Glassing.  Would like to get a CT scan of the hip to determine treatment. [JK]    Clinical Course User Index [JK] Dorie Rank, MD   MDM Rules/Calculators/A&P                           Abnormal hip x-ray findings noted.  Discussed case with Dr. Lyla Glassing.  We will plan on  CT of the hip.  Stressed the importance with patient and his daughter that he should be wearing the hard collar and not the soft collar.  This will help promote healing.  CT scan does show acetabular fx, femoral head injury, pubic rami fx.  Plan to discuss findings with Dr Lyla Glassing. Waiting for call back at time of shift change. Final Clinical Impression(s) / ED Diagnoses pending    Dorie Rank, MD 05/27/21 [phone]

## 2021-05-27 NOTE — ED Triage Notes (Signed)
Pt reports having a neck fracture and endorses continued pain he states he never received any pain medication from [facility].

## 2021-05-27 NOTE — Discharge Instructions (Signed)
Keep your appointment with neurosurgery for the cervical fracture wear your hard collar.  Also have them evaluate the endplate lumbar fifth vertebrae fracture.  Make an appointment to follow-up with orthopedics regarding the right hip fracture.  They state it is okay to weight-bear on it take the pain medicine as needed.  Use your crutches or walker.

## 2021-05-27 NOTE — ED Notes (Signed)
An After Visit Summary was printed and given to the patient. Discharge instructions given and no further questions at this time.  Pt leaving with family.

## 2021-05-27 NOTE — ED Notes (Signed)
ED Provider at bedside. 

## 2021-06-03 ENCOUNTER — Encounter: Payer: Self-pay | Admitting: Podiatry

## 2021-06-03 ENCOUNTER — Ambulatory Visit (INDEPENDENT_AMBULATORY_CARE_PROVIDER_SITE_OTHER): Payer: Medicare HMO | Admitting: Podiatry

## 2021-06-03 DIAGNOSIS — M79675 Pain in left toe(s): Secondary | ICD-10-CM

## 2021-06-03 DIAGNOSIS — B351 Tinea unguium: Secondary | ICD-10-CM | POA: Diagnosis not present

## 2021-06-03 DIAGNOSIS — M79674 Pain in right toe(s): Secondary | ICD-10-CM

## 2021-06-04 DIAGNOSIS — S12191A Other nondisplaced fracture of second cervical vertebra, initial encounter for closed fracture: Secondary | ICD-10-CM | POA: Diagnosis not present

## 2021-06-04 NOTE — Progress Notes (Signed)
Subjective:   Patient ID: Gary Stevenson, male   DOB: 80 y.o.   MRN: [id number]   HPI Patient presents with caregiver stating his toenails are thick and elongated and they were concerned because they do have a green tinge to the   ROS      Objective:  Physical Exam  Neurovascular status intact with slight green tinge which may have been due to some exposure to chemical that created this but it is not creating any erythema edema no drainage with nails elongated thickened painful     Assessment:  Mycotic nail infection with pain 1-5 both feet with possible chemical exposure but nothing that appears to create a pathology     Plan:  Debrided nailbeds 1-5 both feet no iatrogenic bleeding reappoint to recheck

## 2021-06-09 DIAGNOSIS — M25551 Pain in right hip: Secondary | ICD-10-CM | POA: Diagnosis not present

## 2021-06-09 DIAGNOSIS — S32451A Displaced transverse fracture of right acetabulum, initial encounter for closed fracture: Secondary | ICD-10-CM | POA: Diagnosis not present

## 2021-06-09 DIAGNOSIS — S72061A Displaced articular fracture of head of right femur, initial encounter for closed fracture: Secondary | ICD-10-CM | POA: Diagnosis not present

## 2021-07-02 DIAGNOSIS — S12191A Other nondisplaced fracture of second cervical vertebra, initial encounter for closed fracture: Secondary | ICD-10-CM | POA: Diagnosis not present

## 2021-07-02 DIAGNOSIS — R030 Elevated blood-pressure reading, without diagnosis of hypertension: Secondary | ICD-10-CM | POA: Diagnosis not present

## 2021-07-03 ENCOUNTER — Other Ambulatory Visit: Payer: Self-pay | Admitting: Student

## 2021-07-03 DIAGNOSIS — S72031A Displaced midcervical fracture of right femur, initial encounter for closed fracture: Secondary | ICD-10-CM | POA: Diagnosis not present

## 2021-07-03 DIAGNOSIS — S12191A Other nondisplaced fracture of second cervical vertebra, initial encounter for closed fracture: Secondary | ICD-10-CM

## 2021-07-03 DIAGNOSIS — S72061D Displaced articular fracture of head of right femur, subsequent encounter for closed fracture with routine healing: Secondary | ICD-10-CM | POA: Diagnosis not present

## 2021-07-03 DIAGNOSIS — S32451A Displaced transverse fracture of right acetabulum, initial encounter for closed fracture: Secondary | ICD-10-CM | POA: Diagnosis not present

## 2021-07-06 ENCOUNTER — Ambulatory Visit: Payer: Self-pay | Admitting: Student

## 2021-07-06 ENCOUNTER — Other Ambulatory Visit (HOSPITAL_COMMUNITY): Payer: Self-pay | Admitting: Student

## 2021-07-06 DIAGNOSIS — S12191A Other nondisplaced fracture of second cervical vertebra, initial encounter for closed fracture: Secondary | ICD-10-CM

## 2021-07-06 NOTE — Progress Notes (Signed)
Sent message, via epic in basket, requesting orders in epic from surgeon.  

## 2021-07-07 NOTE — Patient Instructions (Addendum)
DUE TO COVID-19 ONLY ONE VISITOR IS ALLOWED TO COME WITH YOU AND STAY IN THE WAITING ROOM ONLY DURING PRE OP AND PROCEDURE.   **NO VISITORS ARE ALLOWED IN THE SHORT STAY AREA OR RECOVERY ROOM!!**  IF YOU WILL BE ADMITTED INTO THE HOSPITAL YOU ARE ALLOWED ONLY TWO SUPPORT PEOPLE DURING VISITATION HOURS ONLY (7 AM -8PM)   The support person(s) must pass our screening, gel in and out, and wear a mask at all times, including in the patients room. Patients must also wear a mask when staff or their support person are in the room. Visitors GUEST BADGE MUST BE WORN VISIBLY  One adult visitor may remain with you overnight and MUST be in the room by 8 P.M.  No visitors under the age of 29. Any visitor under the age of 33 must be accompanied by an adult.    COVID SWAB TESTING MUST BE COMPLETED ON:  06/28/2021 8:00 AM   Site: [facility] [place] Enter: Main Entrance have a seat in the waiting area to the right of main entrance (DO NOT [place]!!!!!) Dial: [phone] to alert staff you have arrived  You are not required to quarantine, however you are required to wear a well-fitted mask when you are out and around people not in your household.  Hand Hygiene often Do NOT share personal items Notify your provider if you are in close contact with someone who has COVID or you develop fever 100.4 or greater, new onset of sneezing, cough, sore throat, shortness of breath or body aches.       Your procedure is scheduled on: 07/15/2021   Report to [facility] Main Entrance    Report to admitting at 10:45 AM   Call this number if you have problems the morning of surgery [phone]   Do not eat food :After Midnight.   May have liquids until 10:30 AM day of surgery  CLEAR LIQUID DIET  Foods Allowed                                                                     Foods Excluded  Water, Black Coffee and tea (no milk or creamer)           liquids  that you cannot  Plain Jell-O in any flavor  (No red)                                    see through such as: Fruit ices (not with fruit pulp)                                            milk, soups, orange juice              Iced Popsicles (No red)  All solid food                                   Apple juices  Sports drinks like Gatorade (No red) Lightly seasoned clear broth or consume(fat free) Sugar     The day of surgery:  Drink ONE (1) Pre-Surgery Clear Ensure at 10:30 AM the morning of surgery. Drink in one sitting. Do not sip.  This drink was given to you during your hospital  pre-op appointment visit. Nothing else to drink after completing the  Pre-Surgery Clear Ensure.          If you have questions, please contact your surgeons office.     Oral Hygiene is also important to reduce your risk of infection.                                    Remember - BRUSH YOUR TEETH THE MORNING OF SURGERY WITH YOUR REGULAR TOOTHPASTE   Take these medicines the morning of surgery with A SIP OF WATER: Norco                              You may not have any metal on your body including jewelry, and body piercing             Do not wear lotions, powders, cologne, or deodorant              Men may shave face and neck.   Do not bring valuables to the hospital. [place].   Bring small overnight bag day of surgery.   Special Instructions: Bring a copy of your healthcare power of attorney and living will documents         the day of surgery if you haven't scanned them before.              Please read over the following fact sheets you were given: IF YOU HAVE QUESTIONS ABOUT YOUR PRE-OP INSTRUCTIONS PLEASE CALL [place] - Preparing for Surgery Before surgery, you can play an important role.  Because skin is not sterile, your skin needs to be as free of germs as possible.   You can reduce the number of germs on your skin by washing with CHG (chlorahexidine gluconate) soap before surgery.  CHG is an antiseptic cleaner which kills germs and bonds with the skin to continue killing germs even after washing. Please DO NOT use if you have an allergy to CHG or antibacterial soaps.  If your skin becomes reddened/irritated stop using the CHG and inform your nurse when you arrive at Short Stay. Do not shave (including legs and underarms) for at least 48 hours prior to the first CHG shower.  You may shave your face/neck.  Please follow these instructions carefully:  1.  Shower with CHG Soap the night before surgery and the  morning of surgery.  2.  If you choose to wash your hair, wash your hair first as usual with your normal  shampoo.  3.  After you shampoo, rinse your hair and body thoroughly to remove the shampoo.  4.  Use CHG as you would any other liquid soap.  You can apply chg directly to the skin and wash.  Gently with a scrungie or clean washcloth.  5.  Apply the CHG Soap to your body ONLY FROM THE NECK DOWN.   Do   not use on face/ open                           Wound or open sores. Avoid contact with eyes, ears mouth and   genitals (private parts).                       Wash face,  Genitals (private parts) with your normal soap.             6.  Wash thoroughly, paying special attention to the area where your    surgery  will be performed.  7.  Thoroughly rinse your body with warm water from the neck down.  8.  DO NOT shower/wash with your normal soap after using and rinsing off the CHG Soap.                9.  Pat yourself dry with a clean towel.            10.  Wear clean pajamas.            11.  Place clean sheets on your bed the night of your first shower and do not  sleep with pets. Day of Surgery : Do not apply any lotions/deodorants the morning of surgery.  Please wear clean clothes to the hospital/surgery center.  FAILURE TO  FOLLOW THESE INSTRUCTIONS MAY RESULT IN THE CANCELLATION OF YOUR SURGERY  PATIENT SIGNATURE_________________________________  NURSE SIGNATURE__________________________________  ________________________________________________________________________   Gary Stevenson  An incentive spirometer is a tool that can help keep your lungs clear and active. This tool measures how well you are filling your lungs with each breath. Taking long deep breaths may help reverse or decrease the chance of developing breathing (pulmonary) problems (especially infection) following: A long period of time when you are unable to move or be active. BEFORE THE PROCEDURE  If the spirometer includes an indicator to show your best effort, your nurse or respiratory therapist will set it to a desired goal. If possible, sit up straight or lean slightly forward. Try not to slouch. Hold the incentive spirometer in an upright position. INSTRUCTIONS FOR USE  Sit on the edge of your bed if possible, or sit up as far as you can in bed or on a chair. Hold the incentive spirometer in an upright position. Breathe out normally. Place the mouthpiece in your mouth and seal your lips tightly around it. Breathe in slowly and as deeply as possible, raising the piston or the ball toward the top of the column. Hold your breath for 3-5 seconds or for as long as possible. Allow the piston or ball to fall to the bottom of the column. Remove the mouthpiece from your mouth and breathe out normally. Rest for a few seconds and repeat Steps 1 through 7 at least 10 times every 1-2 hours when you are awake. Take your time and take a few normal breaths between deep breaths. The spirometer may include an indicator to show your best effort. Use the indicator as a goal to work toward during each repetition. After each set of 10 deep breaths, practice coughing to be sure your  lungs are clear. If you have an incision (the cut made at the time of  surgery), support your incision when coughing by placing a pillow or rolled up towels firmly against it. Once you are able to get out of bed, walk around indoors and cough well. You may stop using the incentive spirometer when instructed by your caregiver.  RISKS AND COMPLICATIONS Take your time so you do not get dizzy or light-headed. If you are in pain, you may need to take or ask for pain medication before doing incentive spirometry. It is harder to take a deep breath if you are having pain. AFTER USE Rest and breathe slowly and easily. It can be helpful to keep track of a log of your progress. Your caregiver can provide you with a simple table to help with this. If you are using the spirometer at home, follow these instructions: [place] IF:  You are having difficultly using the spirometer. You have trouble using the spirometer as often as instructed. Your pain medication is not giving enough relief while using the spirometer. You develop fever of 100.5 F (38.1 C) or higher. SEEK IMMEDIATE MEDICAL CARE IF:  You cough up bloody sputum that had not been present before. You develop fever of 102 F (38.9 C) or greater. You develop worsening pain at or near the incision site. MAKE SURE YOU:  Understand these instructions. Will watch your condition. Will get help right away if you are not doing well or get worse. Document Released: 10/25/2006 Document Revised: 09/06/2011 Document Reviewed: 12/26/2006 ExitCare Patient Information 2014 ExitCare, [place].   ________________________________________________________________________  WHAT IS A BLOOD TRANSFUSION? Blood Transfusion Information  A transfusion is the replacement of blood or some of its parts. Blood is made up of multiple cells which provide different functions. Red blood cells carry oxygen and are used for blood loss replacement. White blood cells fight against infection. Platelets control bleeding. Plasma helps clot  blood. Other blood products are available for specialized needs, such as hemophilia or other clotting disorders. BEFORE THE TRANSFUSION  Who gives blood for transfusions?  Healthy volunteers who are fully evaluated to make sure their blood is safe. This is blood bank blood. Transfusion therapy is the safest it has ever been in the practice of medicine. Before blood is taken from a donor, a complete history is taken to make sure that person has no history of diseases nor engages in risky social behavior (examples are intravenous drug use or sexual activity with multiple partners). The donor's travel history is screened to minimize risk of transmitting infections, such as malaria. The donated blood is tested for signs of infectious diseases, such as HIV and hepatitis. The blood is then tested to be sure it is compatible with you in order to minimize the chance of a transfusion reaction. If you or a relative donates blood, this is often done in anticipation of surgery and is not appropriate for emergency situations. It takes many days to process the donated blood. RISKS AND COMPLICATIONS Although transfusion therapy is very safe and saves many lives, the main dangers of transfusion include:  Getting an infectious disease. Developing a transfusion reaction. This is an allergic reaction to something in the blood you were given. Every precaution is taken to prevent this. The decision to have a blood transfusion has been considered carefully by your caregiver before blood is given. Blood is not given unless the benefits outweigh the risks. AFTER THE TRANSFUSION Right after receiving a blood transfusion,  you will usually feel much better and more energetic. This is especially true if your red blood cells have gotten low (anemic). The transfusion raises the level of the red blood cells which carry oxygen, and this usually causes an energy increase. The nurse administering the transfusion will monitor you  carefully for complications. HOME CARE INSTRUCTIONS  No special instructions are needed after a transfusion. You may find your energy is better. Speak with your caregiver about any limitations on activity for underlying diseases you may have. SEEK MEDICAL CARE IF:  Your condition is not improving after your transfusion. You develop redness or irritation at the intravenous (IV) site. SEEK IMMEDIATE MEDICAL CARE IF:  Any of the following symptoms occur over the next 12 hours: Shaking chills. You have a temperature by mouth above 102 F (38.9 C), not controlled by medicine. Chest, back, or muscle pain. People around you feel you are not acting correctly or are confused. Shortness of breath or difficulty breathing. Dizziness and fainting. You get a rash or develop hives. You have a decrease in urine output. Your urine turns a dark color or changes to pink, red, or brown. Any of the following symptoms occur over the next 10 days: You have a temperature by mouth above 102 F (38.9 C), not controlled by medicine. Shortness of breath. Weakness after normal activity. The white part of the eye turns yellow (jaundice). You have a decrease in the amount of urine or are urinating less often. Your urine turns a dark color or changes to pink, red, or brown. Document Released: 06/11/2000 Document Revised: 09/06/2011 Document Reviewed: 01/29/2008 [facility] Patient Information 2014 [place], [place].  _______________________________________________________________________

## 2021-07-07 NOTE — Progress Notes (Addendum)
COVID swab appointment: 06/29/2021 @ 0800  COVID Vaccine Completed: no Date COVID Vaccine completed: Has received booster: COVID vaccine manufacturer: [employer]   Date of COVID positive in last 90 days: no  PCP - Seward Carol, MD Cardiologist - n/a  Chest x-ray - n/a EKG - 05/06/21 Epic Stress Test - n/a ECHO - 2015 Cardiac Cath - n/a Pacemaker/ICD device last checked: n/a Spinal Cord Stimulator: n/a  Sleep Study - n/a CPAP -   Fasting Blood Sugar - n/a Checks Blood Sugar _____ times a day  Blood Thinner Instructions: n/a Aspirin Instructions: Last Dose:  Activity level: Can perform activities of daily living without stopping and without symptoms of chest pain or shortness of breath. No stairs or walking due to broken hip.   Anesthesia review: Hgb 7.5 at PAT appointment, HTN, anemia   Patient denies shortness of breath, fever, cough and chest pain at PAT appointment   Patient verbalized understanding of instructions that were given to them at the PAT appointment. Patient was also instructed that they will need to review over the PAT instructions again at home before surgery.

## 2021-07-08 ENCOUNTER — Inpatient Hospital Stay (HOSPITAL_COMMUNITY): Payer: Medicare HMO

## 2021-07-08 ENCOUNTER — Encounter (HOSPITAL_COMMUNITY): Payer: Self-pay

## 2021-07-08 ENCOUNTER — Inpatient Hospital Stay (HOSPITAL_COMMUNITY)
Admission: RE | Admit: 2021-07-08 | Discharge: 2021-07-29 | DRG: 003 | Disposition: E | Payer: Medicare HMO | Attending: Critical Care Medicine | Admitting: Critical Care Medicine

## 2021-07-08 ENCOUNTER — Encounter (HOSPITAL_COMMUNITY)
Admission: RE | Admit: 2021-07-08 | Discharge: 2021-07-08 | Disposition: A | Discharge status: Home / Self Care | Payer: Medicare HMO | Source: Ambulatory Visit | Attending: Orthopedic Surgery | Admitting: Orthopedic Surgery

## 2021-07-08 ENCOUNTER — Other Ambulatory Visit (HOSPITAL_COMMUNITY): Payer: Medicare HMO

## 2021-07-08 ENCOUNTER — Other Ambulatory Visit: Payer: Self-pay

## 2021-07-08 VITALS — BP 135/66 | HR 90 | Ht 65.0 in | Wt 130.0 lb

## 2021-07-08 DIAGNOSIS — Z139 Encounter for screening, unspecified: Secondary | ICD-10-CM | POA: Diagnosis not present

## 2021-07-08 DIAGNOSIS — I10 Essential (primary) hypertension: Secondary | ICD-10-CM | POA: Diagnosis not present

## 2021-07-08 DIAGNOSIS — R627 Adult failure to thrive: Secondary | ICD-10-CM | POA: Diagnosis present

## 2021-07-08 DIAGNOSIS — M810 Age-related osteoporosis without current pathological fracture: Secondary | ICD-10-CM | POA: Diagnosis not present

## 2021-07-08 DIAGNOSIS — J181 Lobar pneumonia, unspecified organism: Secondary | ICD-10-CM | POA: Diagnosis not present

## 2021-07-08 DIAGNOSIS — E875 Hyperkalemia: Secondary | ICD-10-CM | POA: Diagnosis present

## 2021-07-08 DIAGNOSIS — R54 Age-related physical debility: Secondary | ICD-10-CM | POA: Diagnosis present

## 2021-07-08 DIAGNOSIS — R918 Other nonspecific abnormal finding of lung field: Secondary | ICD-10-CM | POA: Diagnosis not present

## 2021-07-08 DIAGNOSIS — J9602 Acute respiratory failure with hypercapnia: Secondary | ICD-10-CM | POA: Diagnosis not present

## 2021-07-08 DIAGNOSIS — E8770 Fluid overload, unspecified: Secondary | ICD-10-CM | POA: Diagnosis not present

## 2021-07-08 DIAGNOSIS — I700 Atherosclerosis of aorta: Secondary | ICD-10-CM | POA: Diagnosis not present

## 2021-07-08 DIAGNOSIS — I959 Hypotension, unspecified: Secondary | ICD-10-CM | POA: Diagnosis not present

## 2021-07-08 DIAGNOSIS — I639 Cerebral infarction, unspecified: Secondary | ICD-10-CM | POA: Diagnosis not present

## 2021-07-08 DIAGNOSIS — R188 Other ascites: Secondary | ICD-10-CM | POA: Diagnosis not present

## 2021-07-08 DIAGNOSIS — W1830XA Fall on same level, unspecified, initial encounter: Secondary | ICD-10-CM | POA: Diagnosis present

## 2021-07-08 DIAGNOSIS — G319 Degenerative disease of nervous system, unspecified: Secondary | ICD-10-CM | POA: Diagnosis not present

## 2021-07-08 DIAGNOSIS — J690 Pneumonitis due to inhalation of food and vomit: Secondary | ICD-10-CM | POA: Diagnosis not present

## 2021-07-08 DIAGNOSIS — F1020 Alcohol dependence, uncomplicated: Secondary | ICD-10-CM | POA: Diagnosis present

## 2021-07-08 DIAGNOSIS — G40901 Epilepsy, unspecified, not intractable, with status epilepticus: Secondary | ICD-10-CM | POA: Diagnosis not present

## 2021-07-08 DIAGNOSIS — G928 Other toxic encephalopathy: Secondary | ICD-10-CM | POA: Diagnosis not present

## 2021-07-08 DIAGNOSIS — Z87891 Personal history of nicotine dependence: Secondary | ICD-10-CM | POA: Insufficient documentation

## 2021-07-08 DIAGNOSIS — G936 Cerebral edema: Secondary | ICD-10-CM | POA: Diagnosis not present

## 2021-07-08 DIAGNOSIS — Z01818 Encounter for other preprocedural examination: Secondary | ICD-10-CM | POA: Diagnosis not present

## 2021-07-08 DIAGNOSIS — J9601 Acute respiratory failure with hypoxia: Secondary | ICD-10-CM | POA: Diagnosis not present

## 2021-07-08 DIAGNOSIS — S32401A Unspecified fracture of right acetabulum, initial encounter for closed fracture: Secondary | ICD-10-CM | POA: Diagnosis not present

## 2021-07-08 DIAGNOSIS — S32451A Displaced transverse fracture of right acetabulum, initial encounter for closed fracture: Secondary | ICD-10-CM | POA: Diagnosis not present

## 2021-07-08 DIAGNOSIS — R64 Cachexia: Secondary | ICD-10-CM | POA: Diagnosis present

## 2021-07-08 DIAGNOSIS — Z7189 Other specified counseling: Secondary | ICD-10-CM | POA: Diagnosis not present

## 2021-07-08 DIAGNOSIS — X58XXXA Exposure to other specified factors, initial encounter: Secondary | ICD-10-CM | POA: Insufficient documentation

## 2021-07-08 DIAGNOSIS — E785 Hyperlipidemia, unspecified: Secondary | ICD-10-CM | POA: Diagnosis present

## 2021-07-08 DIAGNOSIS — Z01812 Encounter for preprocedural laboratory examination: Secondary | ICD-10-CM

## 2021-07-08 DIAGNOSIS — Z20822 Contact with and (suspected) exposure to covid-19: Secondary | ICD-10-CM | POA: Insufficient documentation

## 2021-07-08 DIAGNOSIS — D62 Acute posthemorrhagic anemia: Secondary | ICD-10-CM | POA: Diagnosis present

## 2021-07-08 DIAGNOSIS — Z0189 Encounter for other specified special examinations: Secondary | ICD-10-CM

## 2021-07-08 DIAGNOSIS — M7989 Other specified soft tissue disorders: Secondary | ICD-10-CM | POA: Diagnosis not present

## 2021-07-08 DIAGNOSIS — G931 Anoxic brain damage, not elsewhere classified: Secondary | ICD-10-CM | POA: Diagnosis not present

## 2021-07-08 DIAGNOSIS — J156 Pneumonia due to other aerobic Gram-negative bacteria: Secondary | ICD-10-CM | POA: Diagnosis not present

## 2021-07-08 DIAGNOSIS — K3189 Other diseases of stomach and duodenum: Secondary | ICD-10-CM | POA: Diagnosis not present

## 2021-07-08 DIAGNOSIS — E162 Hypoglycemia, unspecified: Secondary | ICD-10-CM | POA: Diagnosis not present

## 2021-07-08 DIAGNOSIS — Z452 Encounter for adjustment and management of vascular access device: Secondary | ICD-10-CM | POA: Diagnosis not present

## 2021-07-08 DIAGNOSIS — R1110 Vomiting, unspecified: Secondary | ICD-10-CM

## 2021-07-08 DIAGNOSIS — I6201 Nontraumatic acute subdural hemorrhage: Secondary | ICD-10-CM | POA: Diagnosis not present

## 2021-07-08 DIAGNOSIS — J9691 Respiratory failure, unspecified with hypoxia: Secondary | ICD-10-CM | POA: Diagnosis not present

## 2021-07-08 DIAGNOSIS — S72001A Fracture of unspecified part of neck of right femur, initial encounter for closed fracture: Secondary | ICD-10-CM | POA: Insufficient documentation

## 2021-07-08 DIAGNOSIS — R6521 Severe sepsis with septic shock: Secondary | ICD-10-CM | POA: Diagnosis not present

## 2021-07-08 DIAGNOSIS — G9341 Metabolic encephalopathy: Secondary | ICD-10-CM | POA: Diagnosis not present

## 2021-07-08 DIAGNOSIS — I6203 Nontraumatic chronic subdural hemorrhage: Secondary | ICD-10-CM | POA: Diagnosis present

## 2021-07-08 DIAGNOSIS — A419 Sepsis, unspecified organism: Secondary | ICD-10-CM | POA: Diagnosis not present

## 2021-07-08 DIAGNOSIS — S72051A Unspecified fracture of head of right femur, initial encounter for closed fracture: Secondary | ICD-10-CM | POA: Diagnosis not present

## 2021-07-08 DIAGNOSIS — T17590A Other foreign object in bronchus causing asphyxiation, initial encounter: Secondary | ICD-10-CM | POA: Diagnosis not present

## 2021-07-08 DIAGNOSIS — Z681 Body mass index (BMI) 19 or less, adult: Secondary | ICD-10-CM

## 2021-07-08 DIAGNOSIS — M25751 Osteophyte, right hip: Secondary | ICD-10-CM | POA: Diagnosis present

## 2021-07-08 DIAGNOSIS — J9600 Acute respiratory failure, unspecified whether with hypoxia or hypercapnia: Secondary | ICD-10-CM

## 2021-07-08 DIAGNOSIS — K567 Ileus, unspecified: Secondary | ICD-10-CM | POA: Diagnosis not present

## 2021-07-08 DIAGNOSIS — N1831 Chronic kidney disease, stage 3a: Secondary | ICD-10-CM | POA: Diagnosis present

## 2021-07-08 DIAGNOSIS — Z419 Encounter for procedure for purposes other than remedying health state, unspecified: Secondary | ICD-10-CM

## 2021-07-08 DIAGNOSIS — L8990 Pressure ulcer of unspecified site, unspecified stage: Secondary | ICD-10-CM | POA: Diagnosis not present

## 2021-07-08 DIAGNOSIS — I129 Hypertensive chronic kidney disease with stage 1 through stage 4 chronic kidney disease, or unspecified chronic kidney disease: Secondary | ICD-10-CM | POA: Insufficient documentation

## 2021-07-08 DIAGNOSIS — R7881 Bacteremia: Secondary | ICD-10-CM | POA: Diagnosis not present

## 2021-07-08 DIAGNOSIS — J9811 Atelectasis: Secondary | ICD-10-CM | POA: Diagnosis not present

## 2021-07-08 DIAGNOSIS — Z96641 Presence of right artificial hip joint: Secondary | ICD-10-CM | POA: Diagnosis not present

## 2021-07-08 DIAGNOSIS — I4891 Unspecified atrial fibrillation: Secondary | ICD-10-CM | POA: Diagnosis not present

## 2021-07-08 DIAGNOSIS — S32501A Unspecified fracture of right pubis, initial encounter for closed fracture: Secondary | ICD-10-CM | POA: Diagnosis not present

## 2021-07-08 DIAGNOSIS — Z902 Acquired absence of lung [part of]: Secondary | ICD-10-CM

## 2021-07-08 DIAGNOSIS — K6389 Other specified diseases of intestine: Secondary | ICD-10-CM | POA: Diagnosis not present

## 2021-07-08 DIAGNOSIS — R296 Repeated falls: Secondary | ICD-10-CM | POA: Diagnosis present

## 2021-07-08 DIAGNOSIS — R339 Retention of urine, unspecified: Secondary | ICD-10-CM | POA: Diagnosis not present

## 2021-07-08 DIAGNOSIS — F1010 Alcohol abuse, uncomplicated: Secondary | ICD-10-CM | POA: Insufficient documentation

## 2021-07-08 DIAGNOSIS — R0603 Acute respiratory distress: Secondary | ICD-10-CM | POA: Diagnosis not present

## 2021-07-08 DIAGNOSIS — S72011A Unspecified intracapsular fracture of right femur, initial encounter for closed fracture: Principal | ICD-10-CM | POA: Diagnosis present

## 2021-07-08 DIAGNOSIS — S72451A Displaced supracondylar fracture without intracondylar extension of lower end of right femur, initial encounter for closed fracture: Secondary | ICD-10-CM | POA: Diagnosis not present

## 2021-07-08 DIAGNOSIS — R4182 Altered mental status, unspecified: Secondary | ICD-10-CM | POA: Diagnosis not present

## 2021-07-08 DIAGNOSIS — M1611 Unilateral primary osteoarthritis, right hip: Secondary | ICD-10-CM | POA: Diagnosis not present

## 2021-07-08 DIAGNOSIS — I6340 Cerebral infarction due to embolism of unspecified cerebral artery: Secondary | ICD-10-CM | POA: Diagnosis not present

## 2021-07-08 DIAGNOSIS — I517 Cardiomegaly: Secondary | ICD-10-CM | POA: Diagnosis not present

## 2021-07-08 DIAGNOSIS — R0902 Hypoxemia: Secondary | ICD-10-CM | POA: Diagnosis not present

## 2021-07-08 DIAGNOSIS — Z9911 Dependence on respirator [ventilator] status: Secondary | ICD-10-CM | POA: Diagnosis not present

## 2021-07-08 DIAGNOSIS — E8809 Other disorders of plasma-protein metabolism, not elsewhere classified: Secondary | ICD-10-CM | POA: Diagnosis present

## 2021-07-08 DIAGNOSIS — I6523 Occlusion and stenosis of bilateral carotid arteries: Secondary | ICD-10-CM | POA: Diagnosis not present

## 2021-07-08 DIAGNOSIS — L89152 Pressure ulcer of sacral region, stage 2: Secondary | ICD-10-CM | POA: Diagnosis present

## 2021-07-08 DIAGNOSIS — R4701 Aphasia: Secondary | ICD-10-CM | POA: Diagnosis not present

## 2021-07-08 DIAGNOSIS — Z85118 Personal history of other malignant neoplasm of bronchus and lung: Secondary | ICD-10-CM | POA: Insufficient documentation

## 2021-07-08 DIAGNOSIS — Z930 Tracheostomy status: Secondary | ICD-10-CM

## 2021-07-08 DIAGNOSIS — R34 Anuria and oliguria: Secondary | ICD-10-CM | POA: Diagnosis not present

## 2021-07-08 DIAGNOSIS — Z4682 Encounter for fitting and adjustment of non-vascular catheter: Secondary | ICD-10-CM | POA: Diagnosis not present

## 2021-07-08 DIAGNOSIS — N179 Acute kidney failure, unspecified: Secondary | ICD-10-CM | POA: Diagnosis not present

## 2021-07-08 DIAGNOSIS — S72061A Displaced articular fracture of head of right femur, initial encounter for closed fracture: Secondary | ICD-10-CM | POA: Diagnosis not present

## 2021-07-08 DIAGNOSIS — I671 Cerebral aneurysm, nonruptured: Secondary | ICD-10-CM | POA: Diagnosis present

## 2021-07-08 DIAGNOSIS — Z9181 History of falling: Secondary | ICD-10-CM | POA: Insufficient documentation

## 2021-07-08 DIAGNOSIS — E43 Unspecified severe protein-calorie malnutrition: Secondary | ICD-10-CM | POA: Diagnosis not present

## 2021-07-08 DIAGNOSIS — E870 Hyperosmolality and hypernatremia: Secondary | ICD-10-CM | POA: Diagnosis present

## 2021-07-08 DIAGNOSIS — J189 Pneumonia, unspecified organism: Secondary | ICD-10-CM

## 2021-07-08 DIAGNOSIS — R569 Unspecified convulsions: Secondary | ICD-10-CM | POA: Diagnosis not present

## 2021-07-08 DIAGNOSIS — J9690 Respiratory failure, unspecified, unspecified whether with hypoxia or hypercapnia: Secondary | ICD-10-CM | POA: Diagnosis not present

## 2021-07-08 DIAGNOSIS — Z515 Encounter for palliative care: Secondary | ICD-10-CM

## 2021-07-08 DIAGNOSIS — J984 Other disorders of lung: Secondary | ICD-10-CM | POA: Diagnosis not present

## 2021-07-08 DIAGNOSIS — J90 Pleural effusion, not elsewhere classified: Secondary | ICD-10-CM | POA: Diagnosis not present

## 2021-07-08 DIAGNOSIS — Z66 Do not resuscitate: Secondary | ICD-10-CM | POA: Diagnosis not present

## 2021-07-08 DIAGNOSIS — S065XAA Traumatic subdural hemorrhage with loss of consciousness status unknown, initial encounter: Secondary | ICD-10-CM | POA: Diagnosis not present

## 2021-07-08 DIAGNOSIS — D638 Anemia in other chronic diseases classified elsewhere: Secondary | ICD-10-CM | POA: Diagnosis present

## 2021-07-08 DIAGNOSIS — Z471 Aftercare following joint replacement surgery: Secondary | ICD-10-CM | POA: Diagnosis not present

## 2021-07-08 DIAGNOSIS — Z4659 Encounter for fitting and adjustment of other gastrointestinal appliance and device: Secondary | ICD-10-CM | POA: Diagnosis not present

## 2021-07-08 DIAGNOSIS — N189 Chronic kidney disease, unspecified: Secondary | ICD-10-CM | POA: Insufficient documentation

## 2021-07-08 DIAGNOSIS — E871 Hypo-osmolality and hyponatremia: Secondary | ICD-10-CM | POA: Diagnosis not present

## 2021-07-08 DIAGNOSIS — Z09 Encounter for follow-up examination after completed treatment for conditions other than malignant neoplasm: Secondary | ICD-10-CM

## 2021-07-08 DIAGNOSIS — Z79899 Other long term (current) drug therapy: Secondary | ICD-10-CM

## 2021-07-08 DIAGNOSIS — N320 Bladder-neck obstruction: Secondary | ICD-10-CM | POA: Diagnosis not present

## 2021-07-08 DIAGNOSIS — R680 Hypothermia, not associated with low environmental temperature: Secondary | ICD-10-CM | POA: Diagnosis not present

## 2021-07-08 DIAGNOSIS — E874 Mixed disorder of acid-base balance: Secondary | ICD-10-CM | POA: Diagnosis not present

## 2021-07-08 HISTORY — DX: Unspecified osteoarthritis, unspecified site: M19.90

## 2021-07-08 LAB — CBC
HCT: 23.9 % — ABNORMAL LOW (ref 39.0–52.0)
Hemoglobin: 7.5 g/dL — ABNORMAL LOW (ref 13.0–17.0)
MCH: 28.6 pg (ref 26.0–34.0)
MCHC: 31.4 g/dL (ref 30.0–36.0)
MCV: 91.2 fL (ref 80.0–100.0)
Platelets: 562 10*3/uL — ABNORMAL HIGH (ref 150–400)
RBC: 2.62 MIL/uL — ABNORMAL LOW (ref 4.22–5.81)
RDW: 17.4 % — ABNORMAL HIGH (ref 11.5–15.5)
WBC: 8.1 10*3/uL (ref 4.0–10.5)
nRBC: 0 % (ref 0.0–0.2)

## 2021-07-08 LAB — COMPREHENSIVE METABOLIC PANEL
ALT: 24 U/L (ref 0–44)
AST: 35 U/L (ref 15–41)
Albumin: 2.6 g/dL — ABNORMAL LOW (ref 3.5–5.0)
Alkaline Phosphatase: 172 U/L — ABNORMAL HIGH (ref 38–126)
Anion gap: 9 (ref 5–15)
BUN: 99 mg/dL — ABNORMAL HIGH (ref 8–23)
CO2: 20 mmol/L — ABNORMAL LOW (ref 22–32)
Calcium: 8.9 mg/dL (ref 8.9–10.3)
Chloride: 106 mmol/L (ref 98–111)
Creatinine, Ser: 1.46 mg/dL — ABNORMAL HIGH (ref 0.61–1.24)
GFR, Estimated: 48 mL/min — ABNORMAL LOW (ref >60)
Glucose, Bld: 111 mg/dL — ABNORMAL HIGH (ref 70–99)
Potassium: 4.9 mmol/L (ref 3.5–5.1)
Sodium: 135 mmol/L (ref 135–145)
Total Bilirubin: 0.3 mg/dL (ref 0.3–1.2)
Total Protein: 7.8 g/dL (ref 6.5–8.1)

## 2021-07-08 LAB — SURGICAL PCR SCREEN
MRSA, PCR: NEGATIVE
Staphylococcus aureus: POSITIVE — AB

## 2021-07-08 LAB — PREPARE RBC (CROSSMATCH)

## 2021-07-08 LAB — SARS CORONAVIRUS 2 (TAT 6-24 HRS): SARS Coronavirus 2: NEGATIVE

## 2021-07-08 IMAGING — CT CT HIP*R* W/O CM
2 of 3 series · 16 of 46 positions shown, 18 images · non-contrast
Comparison: [DATE]

CLINICAL DATA: Right hip fracture, surgical planning

EXAM:
CT OF THE RIGHT HIP WITHOUT CONTRAST
TECHNIQUE: Multidetector CT imaging of the right hip was performed according to
the standard protocol. Multiplanar CT image reconstructions were
also generated.
RADIATION DOSE REDUCTION: This exam was performed according to the
departmental dose-optimization program which includes automated
exposure control, adjustment of the mA and/or kV according to
patient size and/or use of iterative reconstruction technique.

[Series 4: axial st · axial · 0.42mm/px · z∈[-156,-10]mm · 13 of 85 slices shown, 15 images]
[im 6/85  soft-tissue]
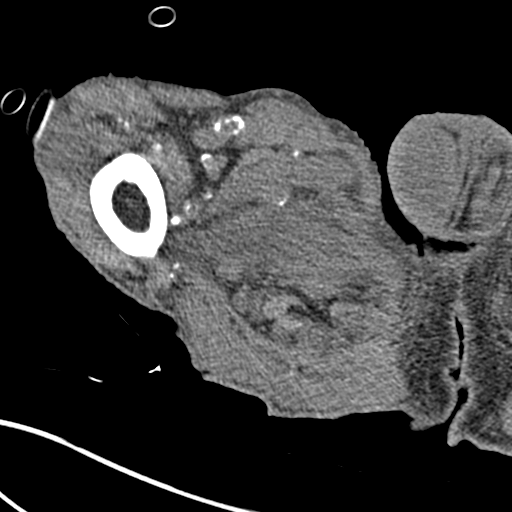
[im 6/85  bone]
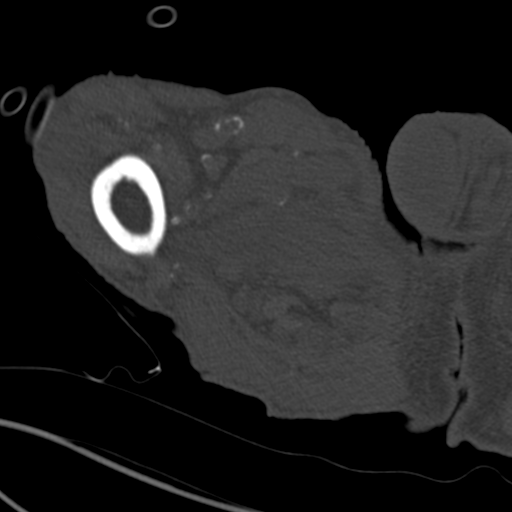
[im 11/85  soft-tissue]
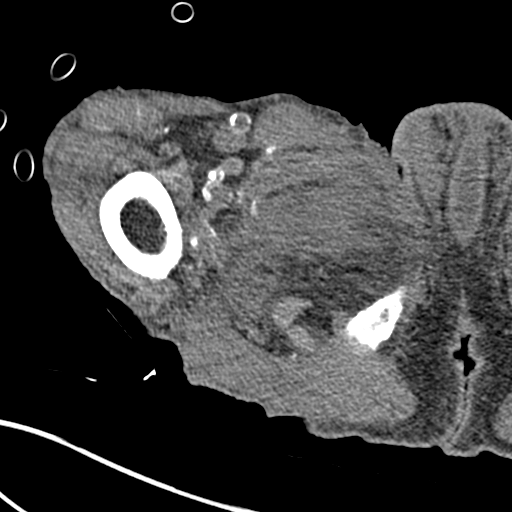
[im 17/85  soft-tissue]
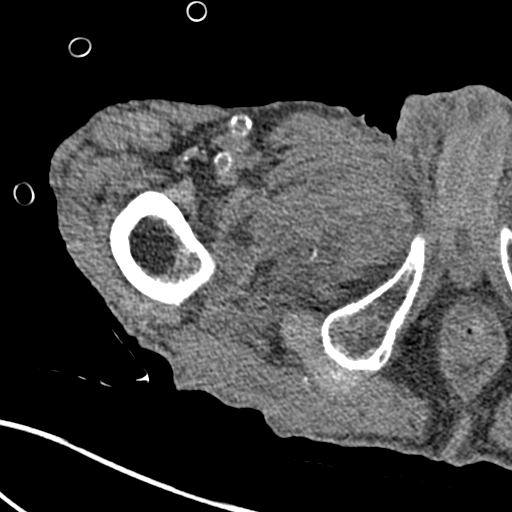
[im 25/85  soft-tissue]
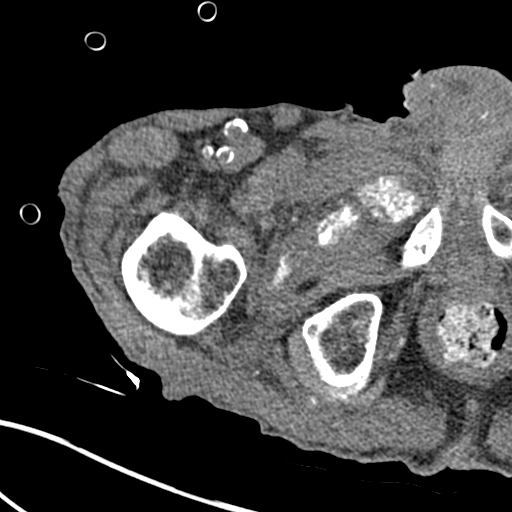
[im 30/85  soft-tissue]
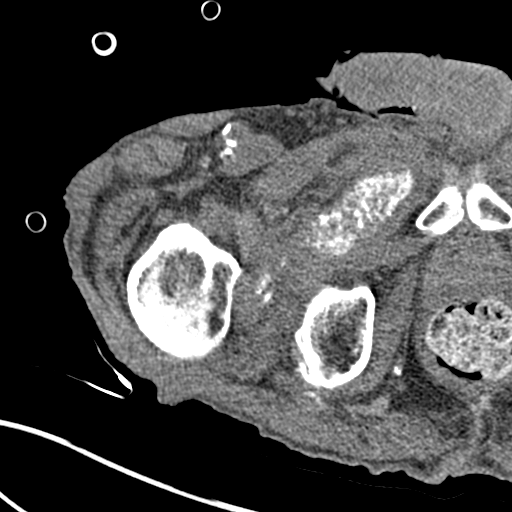
[im 36/85  soft-tissue]
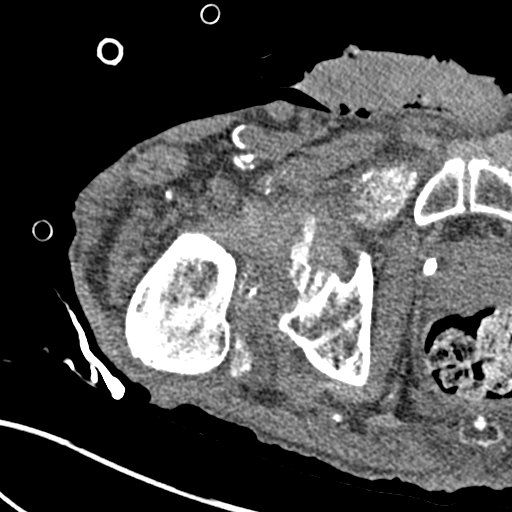
[im 44/85  soft-tissue]
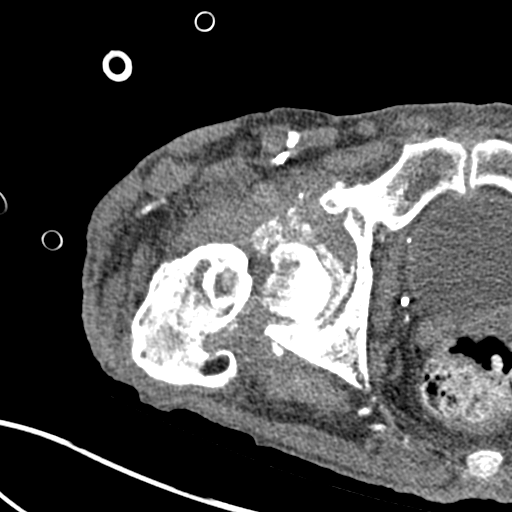
[im 49/85  soft-tissue]
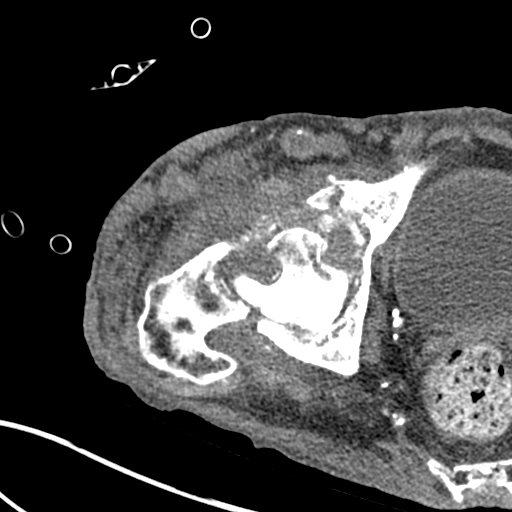
[im 55/85  soft-tissue]
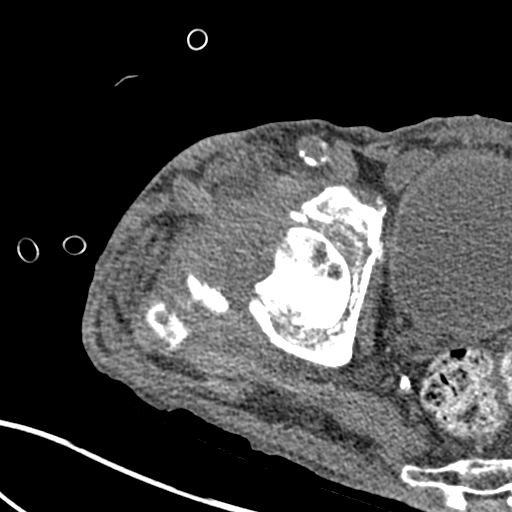
[im 55/85  bone]
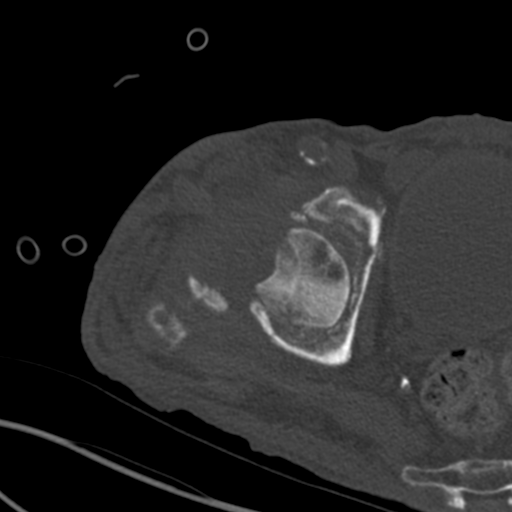
[im 60/85  soft-tissue]
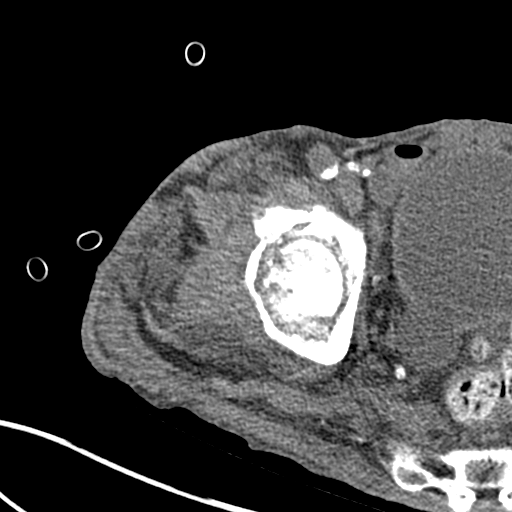
[im 68/85  soft-tissue]
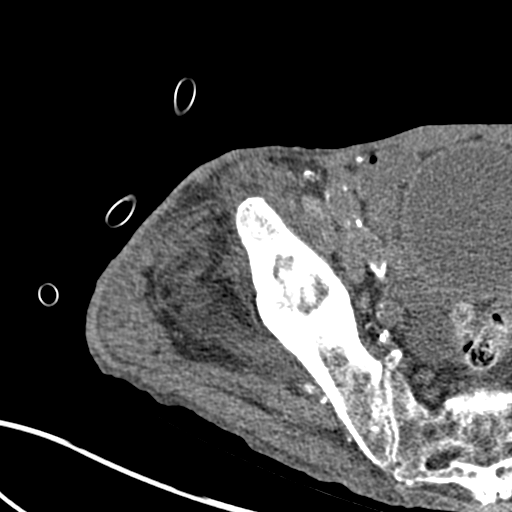
[im 74/85  soft-tissue]
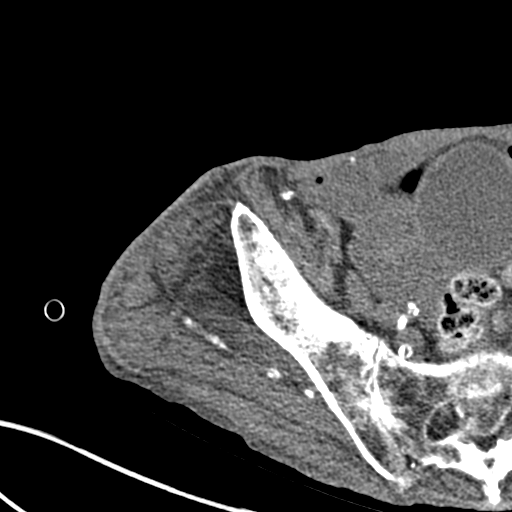
[im 79/85  soft-tissue]
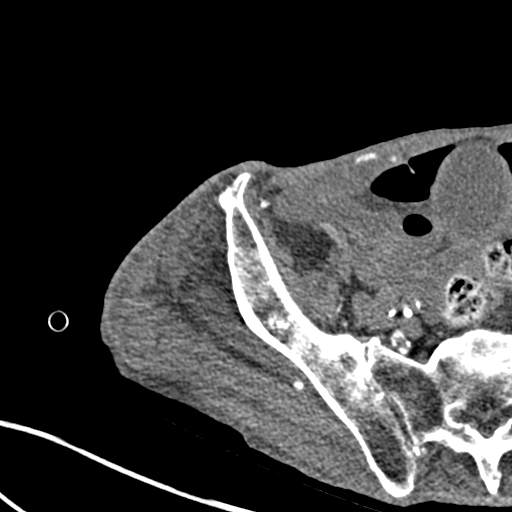

[Series 9: coronal st · coronal · 0.44mm/px · 3 of 101 slices shown]
[im 34/101  soft-tissue]
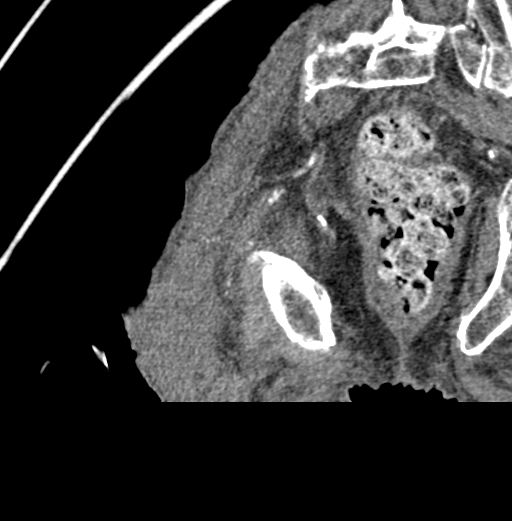
[im 45/101  soft-tissue]
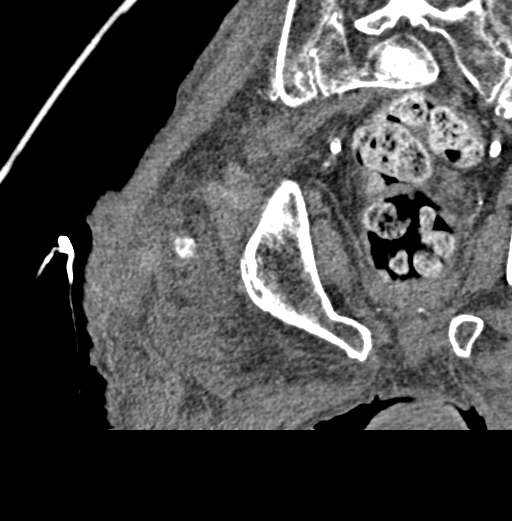
[im 56/101  soft-tissue]
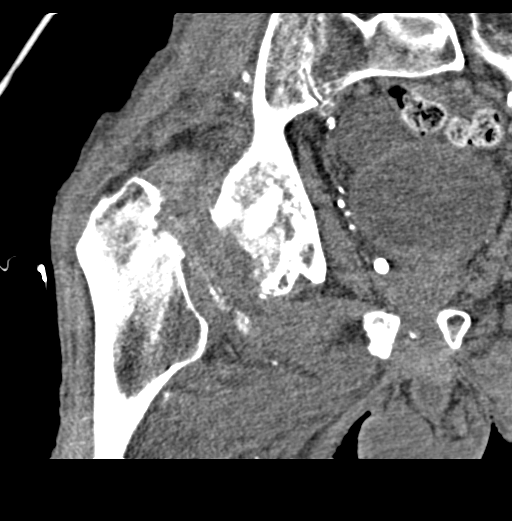

[16 of 46 positions shown; findings below may reference images not displayed]

FINDINGS: Bones/Joint/Cartilage

Anterior column fracture of the right acetabulum noted with
segmental involvement of the anterior wall and vertical component
extending cephalad in the right iliac bone extending towards the
anterior portion of the right SI joint, and with transverse fracture
of the inferior pubic ramus. Equivocal posterior column involvement
with scalloping the posterior acetabular margin and bony
demineralization with some subtle areas of cortical irregularity.
There is some signs of interval healing response with
demineralization along the fracture planes and some mild osteoid
deposition along the anterior column fracture planes. Acetabular
demineralization with potential comminuted small fracture fragments
along the acetabular roof for example on image 33 series 7.

Right femoral neck fracture is observed with varus and apex anterior
angulation, as well as resorption of much of the right femoral neck
and a substantial portion of the right femoral head especially
superiorly. This results in a moderate gap between the remaining
femoral head fragment in the inter trochanteric region, with
pseudoarticulation of a small extension of the femoral neck with the
inter trochanteric region shown on image 36 series 7.

Substantial increase in osteoid deposition within the distended
joint capsule. Substantial osteoid deposition in the right hip
adductor musculature potentially a harbinger of heterotopic
ossification.

Ligaments

Suboptimally assessed by CT.

Muscles and Tendons

Substantial osteoid deposition and probably early heterotopic
calcification in the right hip adductor musculature.

Soft tissues

Dense atherosclerotic calcifications. Suspected right posterior
bladder diverticulum.
IMPRESSION: 1. Comminuted anterior column fracture of the right acetabulum,
likely with involvement of the acetabular roof and with questionable
posterior column involvement. Substantial erosion along the
acetabulum with increased deposition of osteoid in the distended
joint.
2. Femoral neck fracture with resorption of most of the femoral neck
and a portion of the femoral head.
3. Osteoid deposition of possibly early heterotopic calcification in
the right hip adductor musculature.
4. Extensive atherosclerosis.

## 2021-07-08 MED ORDER — HYDROCODONE-ACETAMINOPHEN 5-325 MG PO TABS
1.0000 | ORAL_TABLET | Freq: Four times a day (QID) | ORAL | Status: DC | PRN
  Administered 2021-07-08: 2 via ORAL
  Filled 2021-07-08: qty 2

## 2021-07-08 MED ORDER — MUPIROCIN 2 % EX OINT
1.0000 "application " | TOPICAL_OINTMENT | Freq: Two times a day (BID) | CUTANEOUS | Status: DC
  Administered 2021-07-08 – 2021-07-09 (×2): 1 via NASAL
  Filled 2021-07-08: qty 22

## 2021-07-08 MED ORDER — SODIUM CHLORIDE 0.9% IV SOLUTION: Freq: Once | INTRAVENOUS | Status: DC

## 2021-07-08 MED ORDER — SODIUM CHLORIDE 0.9 % IV SOLN: INTRAVENOUS | Status: DC

## 2021-07-08 MED ORDER — MORPHINE SULFATE (PF) 2 MG/ML IV SOLN
0.5000 mg | INTRAVENOUS | Status: DC | PRN
  Administered 2021-07-09: 0.5 mg via INTRAVENOUS
  Filled 2021-07-08: qty 1

## 2021-07-08 MED ORDER — CHLORHEXIDINE GLUCONATE CLOTH 2 % EX PADS
6.0000 | MEDICATED_PAD | Freq: Every day | CUTANEOUS | Status: DC
  Administered 2021-07-09: 6 via TOPICAL

## 2021-07-08 NOTE — Progress Notes (Signed)
Anesthesia Chart Review   Case: [id number] Date/Time: 07/06/2021 1315   Procedure: TOTAL HIP ARTHROPLASTY ANTERIOR APPROACH (Right: Hip)   Anesthesia type: Spinal   Pre-op diagnosis: Displaced right femoral neck fracture, right acetabulum fracture   Location: [facility] ROOM 08 / [facility]   Surgeons: Rod Can, MD       DISCUSSION:81 y.o. former smoker with h/o brain aneurysm, lung cancer s/p RML lobectomy 01/2009, ETOH abuse, CKD, displaced right femoral neck fracture, right acetabulum fracture scheduled for above procedure 07/22/2021 with Dr. Rod Can.   Hemoglobin 7.5, Dr. Lyla Glassing informed.    Pt sustained C2 fracture and placed in hard cervical collar after a fall in late October.   Pt seen in the ED again 05/27/22, found to have right hip acetabular fracture. Conservative treatment recommended.    Seen by Dr. Lyla Glassing 07/03/21. Pt suffered a new fall and was found to have a new right femoral neck fracture requiring total hip replacement.  VS: BP 135/66    Pulse 90    Ht 5\' 5"  (1.651 m)    Wt 59 kg    SpO2 92%    BMI 21.63 kg/m   PROVIDERS: Seward Carol, MD is PCP    LABS:  forwarded to Dr. Lyla Glassing (all labs ordered are listed, but only abnormal results are displayed)  Labs Reviewed  SURGICAL PCR SCREEN - Abnormal; Notable for the following components:      Result Value   Staphylococcus aureus POSITIVE (*)    All other components within normal limits  COMPREHENSIVE METABOLIC PANEL - Abnormal; Notable for the following components:   CO2 20 (*)    Glucose, Bld 111 (*)    BUN 99 (*)    Creatinine, Ser 1.46 (*)    Albumin 2.6 (*)    Alkaline Phosphatase 172 (*)    GFR, Estimated 48 (*)    All other components within normal limits  CBC - Abnormal; Notable for the following components:   RBC 2.62 (*)    Hemoglobin 7.5 (*)    HCT 23.9 (*)    RDW 17.4 (*)    Platelets 562 (*)    All other components within normal limits  SARS CORONAVIRUS 2 (TAT 6-24 HRS)  TYPE AND SCREEN      IMAGES:   EKG: 05/06/21 Rate 67 bpm  Normal sinus rhythm Nonspecific T wave abnormality Abnormal ECG  CV: Echo 03/17/2014 - Left ventricle: The cavity size was normal. Wall thickness was    increased in a pattern of mild LVH. Systolic function was    vigorous. The estimated ejection fraction was in the range of 65%    to 70%. Doppler parameters are consistent with abnormal left    ventricular relaxation (grade 1 diastolic dysfunction).  Past Medical History:  Diagnosis Date   Arthritis    Brain aneurysm    ETOH abuse    intoxicated   Lung cancer [facility])     Past Surgical History:  Procedure Laterality Date   BRAIN SURGERY     COLONOSCOPY N/A 03/18/2014   Procedure: COLONOSCOPY;  Surgeon: Juanita Craver, MD;  Location: [facility];  Service: Endoscopy;  Laterality: N/A;   ESOPHAGOGASTRODUODENOSCOPY N/A 03/18/2014   Procedure: ESOPHAGOGASTRODUODENOSCOPY (EGD);  Surgeon: Juanita Craver, MD;  Location: [facility];  Service: Endoscopy;  Laterality: N/A;   LUNG SURGERY      MEDICATIONS:  HYDROcodone-acetaminophen (NORCO/VICODIN) 5-325 MG tablet   indomethacin (INDOCIN) 50 MG capsule   meloxicam (MOBIC) 15 MG tablet   naproxen  sodium (ALEVE) 220 MG tablet   No current facility-administered medications for this encounter.     Konrad Felix Ward, PA-C [facility] Pre-Surgical Testing [phone]

## 2021-07-08 NOTE — Progress Notes (Addendum)
Hgb 7.5 and STAPH+ at PAT appointment. Results routed to Dr. Lyla Glassing.

## 2021-07-08 NOTE — Progress Notes (Signed)
Patient's daughter (POA) at bedside. Blood consent and op consent obtained.

## 2021-07-09 ENCOUNTER — Inpatient Hospital Stay (HOSPITAL_COMMUNITY): Payer: Medicare HMO

## 2021-07-09 ENCOUNTER — Encounter (HOSPITAL_COMMUNITY): Payer: Self-pay | Admitting: Orthopedic Surgery

## 2021-07-09 ENCOUNTER — Encounter (HOSPITAL_COMMUNITY): Admission: RE | Disposition: E | Payer: Self-pay | Source: Home / Self Care | Attending: Internal Medicine

## 2021-07-09 ENCOUNTER — Inpatient Hospital Stay (HOSPITAL_COMMUNITY): Payer: Medicare HMO | Admitting: Anesthesiology

## 2021-07-09 DIAGNOSIS — E43 Unspecified severe protein-calorie malnutrition: Secondary | ICD-10-CM | POA: Insufficient documentation

## 2021-07-09 HISTORY — PX: TOTAL HIP ARTHROPLASTY: SHX124

## 2021-07-09 LAB — CBC
HCT: 32.3 % — ABNORMAL LOW (ref 39.0–52.0)
Hemoglobin: 10.8 g/dL — ABNORMAL LOW (ref 13.0–17.0)
MCH: 28.3 pg (ref 26.0–34.0)
MCHC: 33.4 g/dL (ref 30.0–36.0)
MCV: 84.6 fL (ref 80.0–100.0)
Platelets: 505 10*3/uL — ABNORMAL HIGH (ref 150–400)
RBC: 3.82 MIL/uL — ABNORMAL LOW (ref 4.22–5.81)
RDW: 16.5 % — ABNORMAL HIGH (ref 11.5–15.5)
WBC: 7.7 10*3/uL (ref 4.0–10.5)
nRBC: 0 % (ref 0.0–0.2)

## 2021-07-09 LAB — TYPE AND SCREEN
ABO/RH(D): A POS
Antibody Screen: NEGATIVE
Unit division: 0
Unit division: 0
Unit division: 0
Unit division: 0

## 2021-07-09 LAB — BPAM RBC
Blood Product Expiration Date: 202301272359
Blood Product Expiration Date: 202301292359
Blood Product Expiration Date: 202301292359
Blood Product Expiration Date: 202301292359
ISSUE DATE / TIME: 202301112311
ISSUE DATE / TIME: 202301120154
Unit Type and Rh: 6200
Unit Type and Rh: 6200
Unit Type and Rh: 6200
Unit Type and Rh: 6200

## 2021-07-09 LAB — COMPREHENSIVE METABOLIC PANEL
ALT: 19 U/L (ref 0–44)
AST: 30 U/L (ref 15–41)
Albumin: 2.3 g/dL — ABNORMAL LOW (ref 3.5–5.0)
Alkaline Phosphatase: 167 U/L — ABNORMAL HIGH (ref 38–126)
Anion gap: 8 (ref 5–15)
BUN: 81 mg/dL — ABNORMAL HIGH (ref 8–23)
CO2: 22 mmol/L (ref 22–32)
Calcium: 8.5 mg/dL — ABNORMAL LOW (ref 8.9–10.3)
Chloride: 105 mmol/L (ref 98–111)
Creatinine, Ser: 1.32 mg/dL — ABNORMAL HIGH (ref 0.61–1.24)
GFR, Estimated: 55 mL/min — ABNORMAL LOW (ref >60)
Glucose, Bld: 84 mg/dL (ref 70–99)
Potassium: 5 mmol/L (ref 3.5–5.1)
Sodium: 135 mmol/L (ref 135–145)
Total Bilirubin: 0.8 mg/dL (ref 0.3–1.2)
Total Protein: 7.7 g/dL (ref 6.5–8.1)

## 2021-07-09 LAB — PREPARE RBC (CROSSMATCH)

## 2021-07-09 LAB — GLUCOSE, CAPILLARY
Glucose-Capillary: 85 mg/dL (ref 70–99)
Glucose-Capillary: 160 mg/dL — ABNORMAL HIGH (ref 70–99)

## 2021-07-09 IMAGING — DX DG PORTABLE PELVIS
1 series · 1 of 1 positions shown · non-contrast
Comparison: [DATE] intraoperative fluoroscopy, [DATE] CT
right hip

CLINICAL DATA: Status post total right hip arthroplasty

EXAM:
PORTABLE PELVIS 1-2 VIEWS

[pelvis ap]
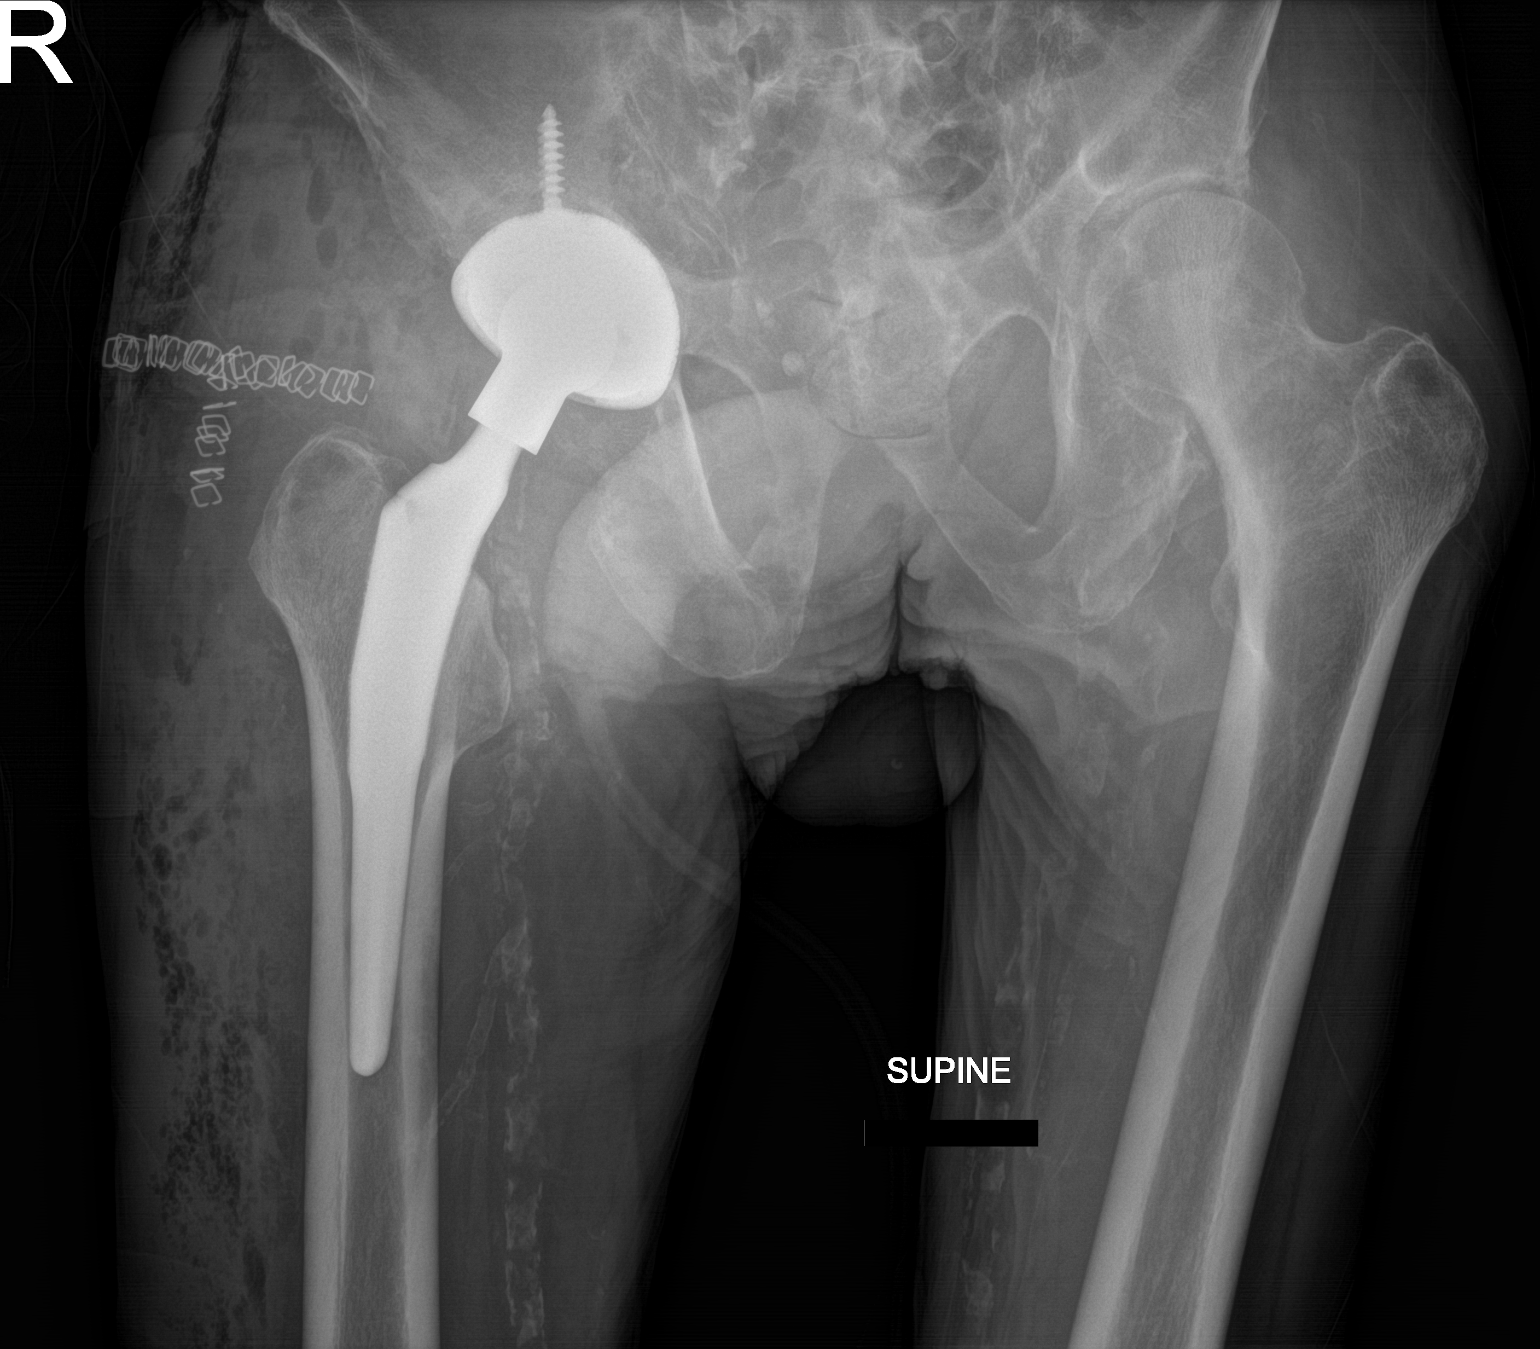

[1 of 1 positions shown; findings below may reference images not displayed]

FINDINGS: Interval total right hip arthroplasty. Expected postoperative
changes including surgical skin staples, soft tissue swelling, and
subcutaneous air. No perihardware lucency is seen to indicate
hardware failure or loosening. Vascular calcifications are noted.
Mild left femoroacetabular joint space narrowing. Diffuse decreased
bone mineralization.
IMPRESSION: Interval total right hip arthroplasty without evidence of hardware
failure.

## 2021-07-09 IMAGING — RF DG HIP (WITH OR WITHOUT PELVIS) 1V*R*
1 series · 12 of 12 positions shown · non-contrast
Comparison: None.

CLINICAL DATA: Right total hip replacement

EXAM:
DG HIP (WITH OR WITHOUT PELVIS) 1V RIGHT

[Series 1: unknown protocol · 0.20mm/px · 12 of 12 slices shown]
[im 1/12]
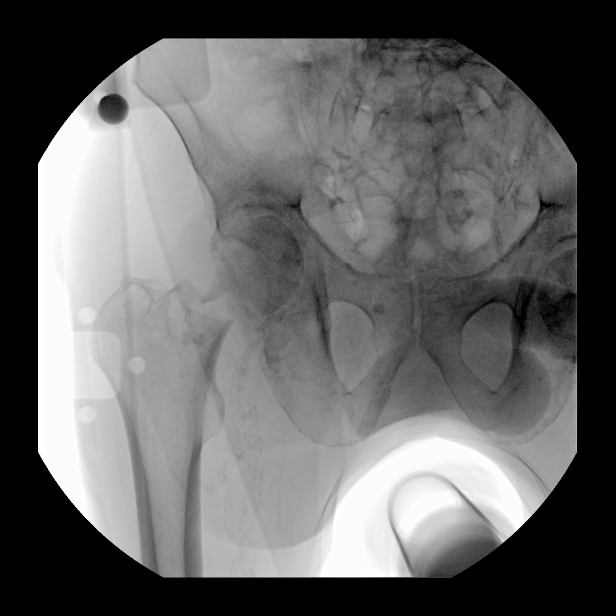
[im 2/12]
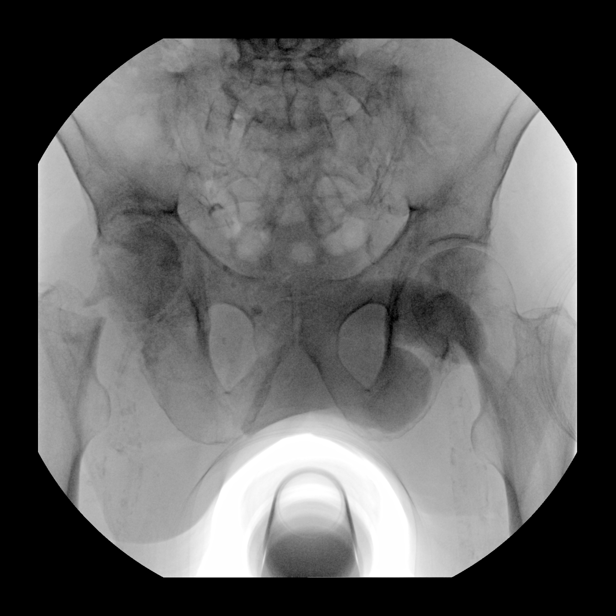
[im 3/12]
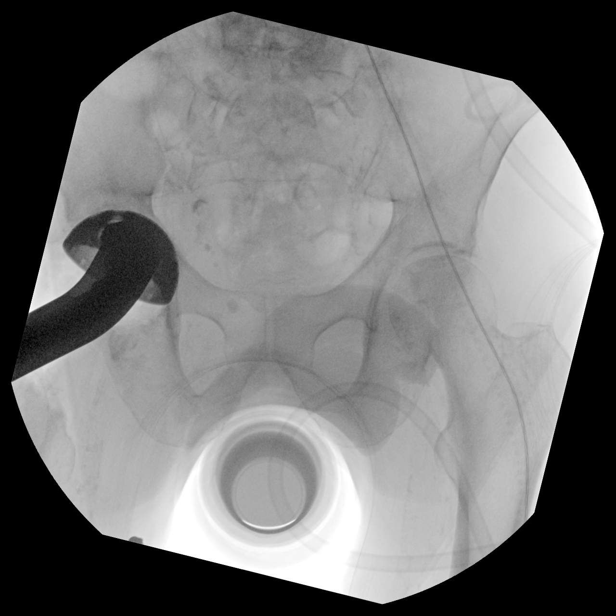
[im 4/12]
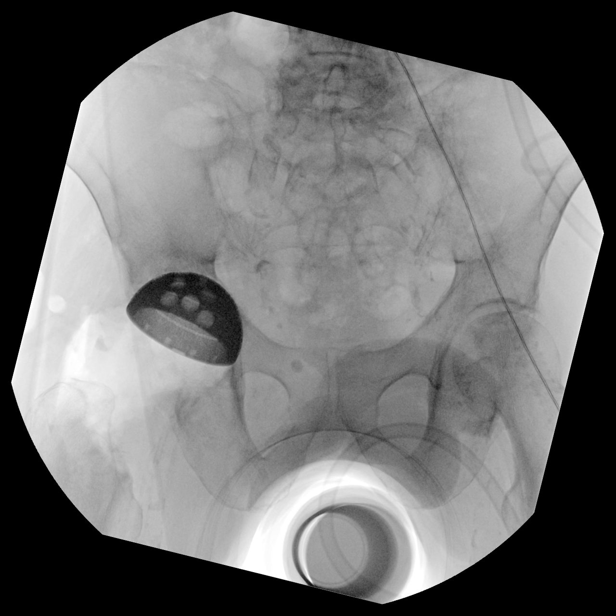
[im 5/12]
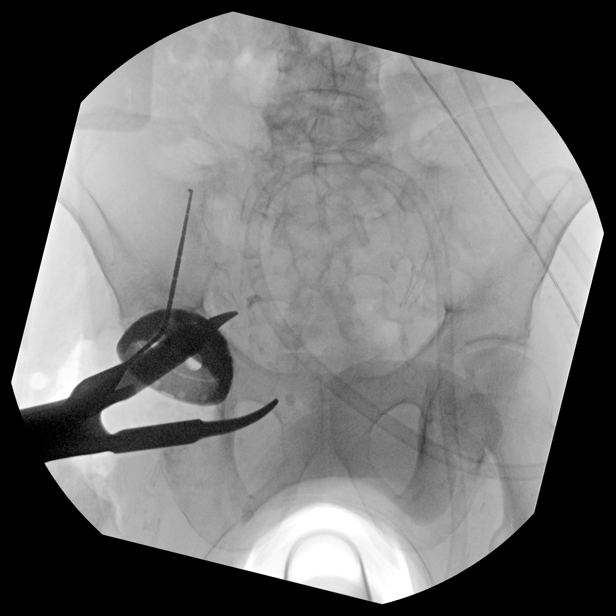
[im 6/12]
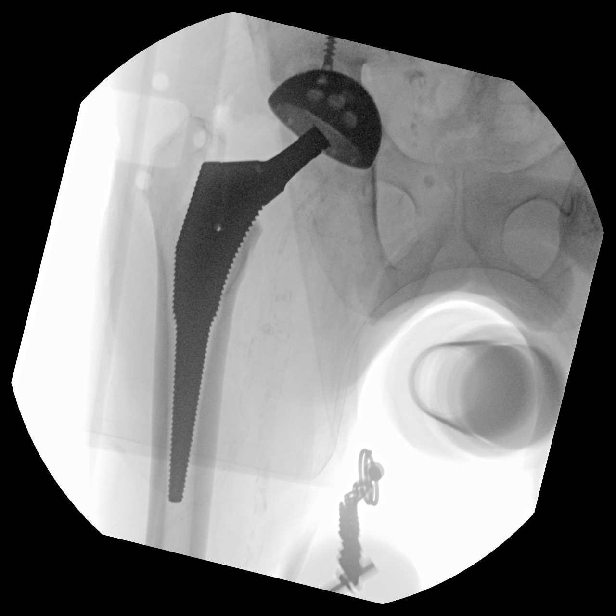
[im 7/12]
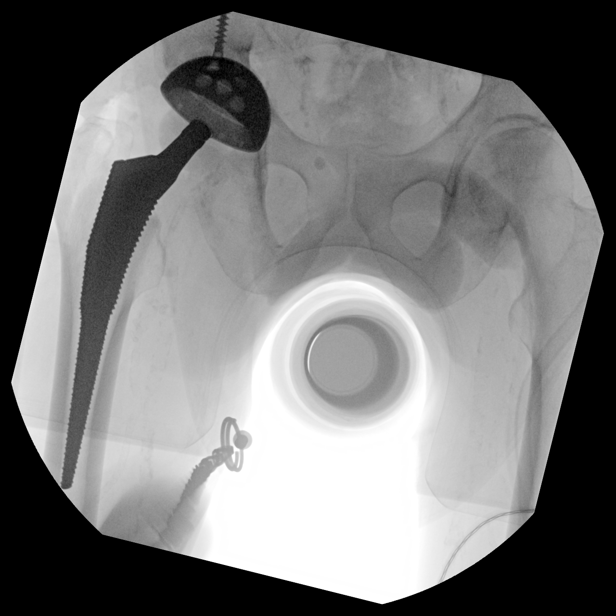
[im 8/12]
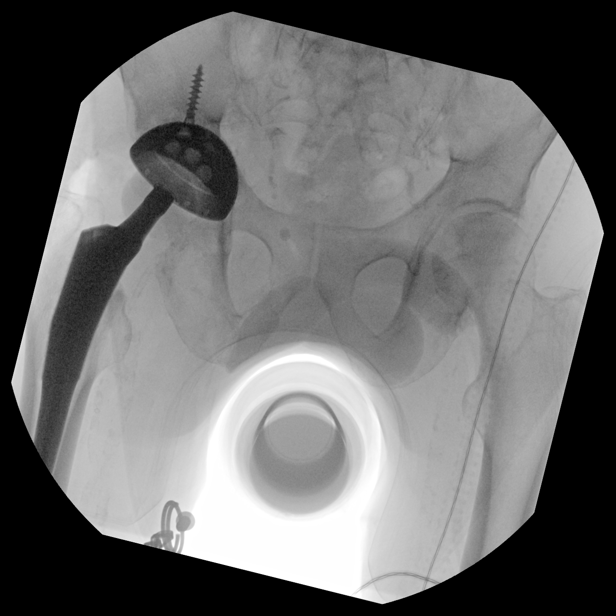
[im 9/12]
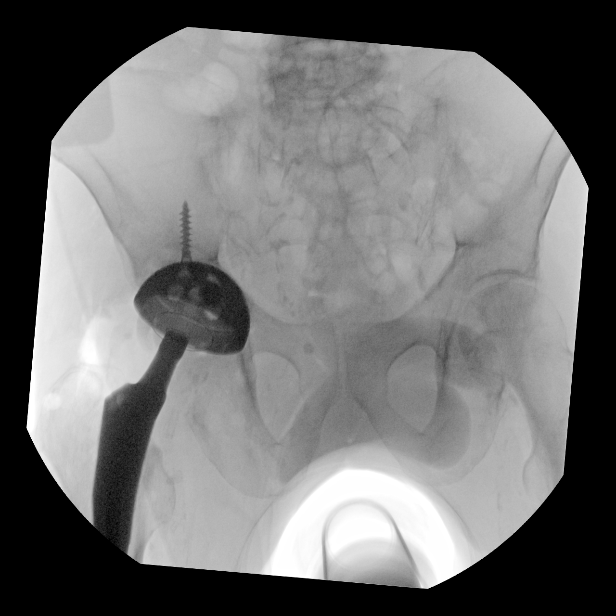
[im 10/12]
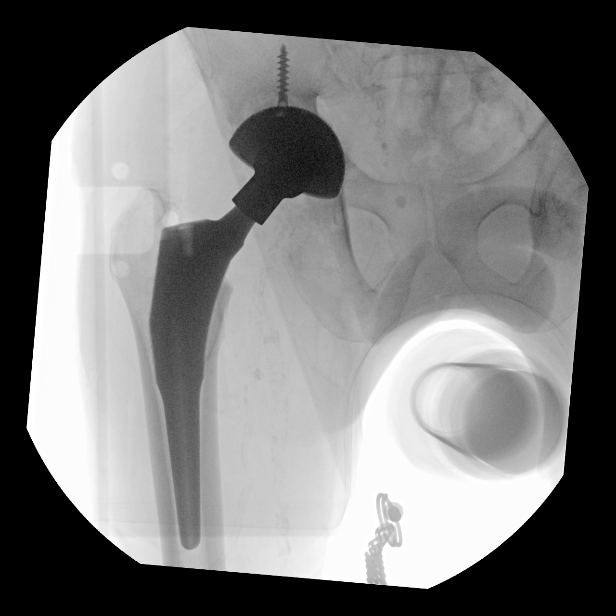
[im 11/12]
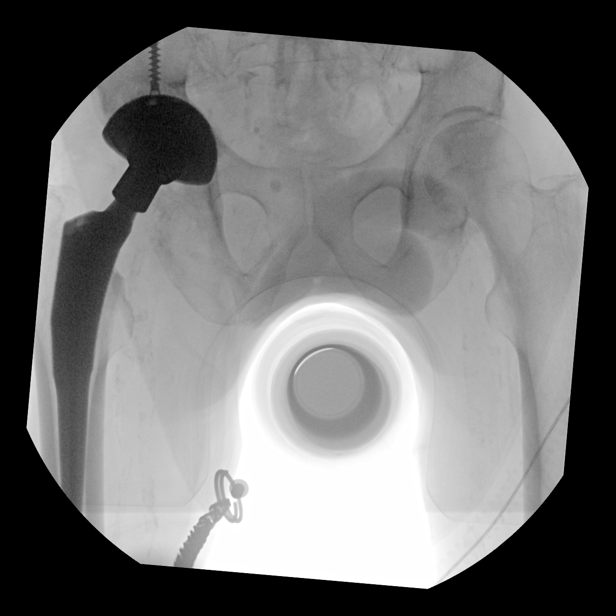
[im 12/12]
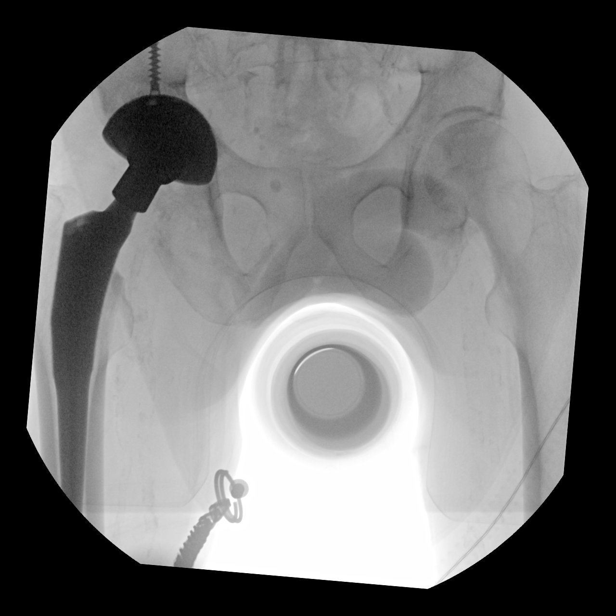

[12 of 12 positions shown; findings below may reference images not displayed]

FINDINGS: Intraoperative images during right total hip arthroplasty. Normal
alignment without evidence of loosening or new fracture on the final
image.
IMPRESSION: Intraoperative images during right total hip arthroplasty. Normal
alignment without evidence of immediate hardware complication.

## 2021-07-09 SURGERY — ARTHROPLASTY, HIP, TOTAL, ANTERIOR APPROACH
Anesthesia: Spinal | Site: Hip | Laterality: Right

## 2021-07-09 MED ORDER — ALUM & MAG HYDROXIDE-SIMETH 200-200-20 MG/5ML PO SUSP
30.0000 mL | ORAL | Status: DC | PRN
  Filled 2021-07-09: qty 30

## 2021-07-09 MED ORDER — BUPIVACAINE-EPINEPHRINE (PF) 0.25% -1:200000 IJ SOLN
INTRAMUSCULAR | Status: AC
  Filled 2021-07-09: qty 30

## 2021-07-09 MED ORDER — ENSURE ENLIVE PO LIQD: 237.0000 mL | Freq: Three times a day (TID) | ORAL | Status: DC

## 2021-07-09 MED ORDER — MENTHOL 3 MG MT LOZG
1.0000 | LOZENGE | OROMUCOSAL | Status: DC | PRN
  Filled 2021-07-09: qty 9

## 2021-07-09 MED ORDER — ASPIRIN 81 MG PO CHEW
81.0000 mg | CHEWABLE_TABLET | Freq: Two times a day (BID) | ORAL | Status: DC
  Filled 2021-07-09: qty 1

## 2021-07-09 MED ORDER — METOCLOPRAMIDE HCL 5 MG PO TABS
5.0000 mg | ORAL_TABLET | Freq: Three times a day (TID) | ORAL | Status: DC | PRN
  Filled 2021-07-09: qty 2

## 2021-07-09 MED ORDER — MORPHINE SULFATE (PF) 2 MG/ML IV SOLN
0.5000 mg | INTRAVENOUS | Status: DC | PRN
  Administered 2021-07-13 – 2021-07-14 (×2): 1 mg via INTRAVENOUS
  Filled 2021-07-09 (×2): qty 1

## 2021-07-09 MED ORDER — DOCUSATE SODIUM 100 MG PO CAPS
100.0000 mg | ORAL_CAPSULE | Freq: Two times a day (BID) | ORAL | Status: DC
  Filled 2021-07-09 (×2): qty 1

## 2021-07-09 MED ORDER — PHENOL 1.4 % MT LIQD
1.0000 | OROMUCOSAL | Status: DC | PRN
  Filled 2021-07-09: qty 177

## 2021-07-09 MED ORDER — ORAL CARE MOUTH RINSE: 15.0000 mL | Freq: Once | OROMUCOSAL | Status: AC

## 2021-07-09 MED ORDER — WATER FOR IRRIGATION, STERILE IR SOLN
Status: DC | PRN
  Administered 2021-07-09: 2000 mL

## 2021-07-09 MED ORDER — LACTATED RINGERS IV SOLN: INTRAVENOUS | Status: DC

## 2021-07-09 MED ORDER — METOCLOPRAMIDE HCL 5 MG/ML IJ SOLN: 5.0000 mg | Freq: Three times a day (TID) | INTRAMUSCULAR | Status: DC | PRN

## 2021-07-09 MED ORDER — SODIUM CHLORIDE 0.9 % IR SOLN
Status: DC | PRN
  Administered 2021-07-09: 1000 mL

## 2021-07-09 MED ORDER — SODIUM CHLORIDE 0.9 % IV SOLN: INTRAVENOUS | Status: DC

## 2021-07-09 MED ORDER — METHOCARBAMOL 500 MG IVPB - SIMPLE MED
500.0000 mg | Freq: Four times a day (QID) | INTRAVENOUS | Status: DC | PRN
  Filled 2021-07-09: qty 50

## 2021-07-09 MED ORDER — CHLORHEXIDINE GLUCONATE 0.12 % MT SOLN
15.0000 mL | Freq: Once | OROMUCOSAL | Status: AC
  Administered 2021-07-09: 15 mL via OROMUCOSAL

## 2021-07-09 MED ORDER — ONDANSETRON HCL 4 MG/2ML IJ SOLN
INTRAMUSCULAR | Status: DC | PRN
  Administered 2021-07-09: 4 mg via INTRAVENOUS

## 2021-07-09 MED ORDER — PROPOFOL 10 MG/ML IV BOLUS
INTRAVENOUS | Status: DC | PRN
Start: 2021-07-09 — End: 2021-07-09
  Administered 2021-07-09: 60 mg via INTRAVENOUS

## 2021-07-09 MED ORDER — CEFAZOLIN SODIUM-DEXTROSE 2-4 GM/100ML-% IV SOLN
2.0000 g | INTRAVENOUS | Status: AC
  Administered 2021-07-09: 2 g via INTRAVENOUS

## 2021-07-09 MED ORDER — DEXAMETHASONE SODIUM PHOSPHATE 10 MG/ML IJ SOLN: 10.0000 mg | Freq: Once | INTRAMUSCULAR | Status: DC

## 2021-07-09 MED ORDER — KETOROLAC TROMETHAMINE 30 MG/ML IJ SOLN
INTRAMUSCULAR | Status: AC
  Filled 2021-07-09: qty 1

## 2021-07-09 MED ORDER — POVIDONE-IODINE 10 % EX SWAB: 2.0000 "application " | Freq: Once | CUTANEOUS | Status: DC

## 2021-07-09 MED ORDER — PHENYLEPHRINE 40 MCG/ML (10ML) SYRINGE FOR IV PUSH (FOR BLOOD PRESSURE SUPPORT)
PREFILLED_SYRINGE | INTRAVENOUS | Status: DC | PRN
Start: 2021-07-09 — End: 2021-07-09
  Administered 2021-07-09: 80 ug via INTRAVENOUS
  Administered 2021-07-09 (×2): 120 ug via INTRAVENOUS
  Administered 2021-07-09: 80 ug via INTRAVENOUS
  Administered 2021-07-09: 120 ug via INTRAVENOUS

## 2021-07-09 MED ORDER — KETOROLAC TROMETHAMINE 15 MG/ML IJ SOLN
7.5000 mg | Freq: Four times a day (QID) | INTRAMUSCULAR | Status: DC
  Administered 2021-07-09: 7.5 mg via INTRAVENOUS
  Filled 2021-07-09 (×2): qty 1

## 2021-07-09 MED ORDER — CEFAZOLIN SODIUM-DEXTROSE 2-4 GM/100ML-% IV SOLN
INTRAVENOUS | Status: AC
  Filled 2021-07-09: qty 100

## 2021-07-09 MED ORDER — ISOPROPYL ALCOHOL 70 % SOLN
Status: DC | PRN
  Administered 2021-07-09: 1 via TOPICAL

## 2021-07-09 MED ORDER — ACETAMINOPHEN 10 MG/ML IV SOLN
INTRAVENOUS | Status: AC
  Filled 2021-07-09: qty 100

## 2021-07-09 MED ORDER — ONDANSETRON HCL 4 MG PO TABS: 4.0000 mg | ORAL_TABLET | Freq: Four times a day (QID) | ORAL | Status: DC | PRN

## 2021-07-09 MED ORDER — ONDANSETRON HCL 4 MG/2ML IJ SOLN: 4.0000 mg | Freq: Four times a day (QID) | INTRAMUSCULAR | Status: DC | PRN

## 2021-07-09 MED ORDER — HYDROCODONE-ACETAMINOPHEN 7.5-325 MG PO TABS: 1.0000 | ORAL_TABLET | ORAL | Status: DC | PRN

## 2021-07-09 MED ORDER — SODIUM CHLORIDE (PF) 0.9 % IJ SOLN
INTRAMUSCULAR | Status: DC | PRN
  Administered 2021-07-09: 30 mL

## 2021-07-09 MED ORDER — ACETAMINOPHEN 10 MG/ML IV SOLN
1000.0000 mg | Freq: Once | INTRAVENOUS | Status: AC
  Administered 2021-07-09: 1000 mg via INTRAVENOUS

## 2021-07-09 MED ORDER — PHENYLEPHRINE HCL-NACL 20-0.9 MG/250ML-% IV SOLN
INTRAVENOUS | Status: DC | PRN
  Administered 2021-07-09: 55 ug/min via INTRAVENOUS

## 2021-07-09 MED ORDER — LACTATED RINGERS IV SOLN: INTRAVENOUS | Status: DC | PRN

## 2021-07-09 MED ORDER — HYDROCODONE-ACETAMINOPHEN 5-325 MG PO TABS
1.0000 | ORAL_TABLET | ORAL | Status: DC | PRN
  Filled 2021-07-09: qty 2

## 2021-07-09 MED ORDER — POLYETHYLENE GLYCOL 3350 17 G PO PACK: 17.0000 g | PACK | Freq: Every day | ORAL | Status: DC | PRN

## 2021-07-09 MED ORDER — DEXAMETHASONE SODIUM PHOSPHATE 10 MG/ML IJ SOLN
INTRAMUSCULAR | Status: DC | PRN
  Administered 2021-07-09: 5 mg via INTRAVENOUS

## 2021-07-09 MED ORDER — SENNA 8.6 MG PO TABS
1.0000 | ORAL_TABLET | Freq: Two times a day (BID) | ORAL | Status: DC
  Administered 2021-07-13 – 2021-07-14 (×2): 8.6 mg via ORAL
  Filled 2021-07-09 (×3): qty 1

## 2021-07-09 MED ORDER — ALBUMIN HUMAN 5 % IV SOLN: INTRAVENOUS | Status: DC | PRN

## 2021-07-09 MED ORDER — ALBUMIN HUMAN 5 % IV SOLN
INTRAVENOUS | Status: AC
  Filled 2021-07-09: qty 250

## 2021-07-09 MED ORDER — DIPHENHYDRAMINE HCL 12.5 MG/5ML PO ELIX
12.5000 mg | ORAL_SOLUTION | ORAL | Status: DC | PRN
  Filled 2021-07-09: qty 10

## 2021-07-09 MED ORDER — BUPIVACAINE-EPINEPHRINE 0.25% -1:200000 IJ SOLN
INTRAMUSCULAR | Status: DC | PRN
  Administered 2021-07-09: 30 mL

## 2021-07-09 MED ORDER — POVIDONE-IODINE 10 % EX SWAB
2.0000 "application " | Freq: Once | CUTANEOUS | Status: AC
  Administered 2021-07-09: 2 via TOPICAL

## 2021-07-09 MED ORDER — FENTANYL CITRATE (PF) 100 MCG/2ML IJ SOLN
INTRAMUSCULAR | Status: AC
  Filled 2021-07-09: qty 2

## 2021-07-09 MED ORDER — CEFAZOLIN SODIUM-DEXTROSE 1-4 GM/50ML-% IV SOLN
1.0000 g | Freq: Four times a day (QID) | INTRAVENOUS | Status: AC
  Administered 2021-07-09 – 2021-07-10 (×2): 1 g via INTRAVENOUS
  Filled 2021-07-09 (×2): qty 50

## 2021-07-09 MED ORDER — ACETAMINOPHEN 325 MG PO TABS: 325.0000 mg | ORAL_TABLET | Freq: Four times a day (QID) | ORAL | Status: DC | PRN

## 2021-07-09 MED ORDER — PHENYLEPHRINE 40 MCG/ML (10ML) SYRINGE FOR IV PUSH (FOR BLOOD PRESSURE SUPPORT)
PREFILLED_SYRINGE | INTRAVENOUS | Status: AC
  Filled 2021-07-09: qty 10

## 2021-07-09 MED ORDER — METHOCARBAMOL 500 MG PO TABS: 500.0000 mg | ORAL_TABLET | Freq: Four times a day (QID) | ORAL | Status: DC | PRN

## 2021-07-09 MED ORDER — TRANEXAMIC ACID-NACL 1000-0.7 MG/100ML-% IV SOLN
1000.0000 mg | INTRAVENOUS | Status: AC
  Administered 2021-07-09: 1000 mg via INTRAVENOUS

## 2021-07-09 MED ORDER — TRANEXAMIC ACID-NACL 1000-0.7 MG/100ML-% IV SOLN
INTRAVENOUS | Status: AC
  Filled 2021-07-09: qty 100

## 2021-07-09 MED ORDER — FENTANYL CITRATE (PF) 100 MCG/2ML IJ SOLN
INTRAMUSCULAR | Status: DC | PRN
  Administered 2021-07-09 (×2): 50 ug via INTRAVENOUS

## 2021-07-09 MED ORDER — LIDOCAINE 2% (20 MG/ML) 5 ML SYRINGE
INTRAMUSCULAR | Status: DC | PRN
  Administered 2021-07-09: 40 mg via INTRAVENOUS

## 2021-07-09 MED ORDER — KETOROLAC TROMETHAMINE 30 MG/ML IJ SOLN
INTRAMUSCULAR | Status: DC | PRN
  Administered 2021-07-09: 30 mg via INTRA_ARTICULAR

## 2021-07-09 MED ORDER — SUGAMMADEX SODIUM 200 MG/2ML IV SOLN
INTRAVENOUS | Status: DC | PRN
  Administered 2021-07-09: 100 mg via INTRAVENOUS

## 2021-07-09 MED ORDER — ROCURONIUM BROMIDE 10 MG/ML (PF) SYRINGE
PREFILLED_SYRINGE | INTRAVENOUS | Status: DC | PRN
  Administered 2021-07-09: 40 mg via INTRAVENOUS
  Administered 2021-07-09: 30 mg via INTRAVENOUS

## 2021-07-09 SURGICAL SUPPLY — 74 items
ADH SKN CLS APL DERMABOND .7 (GAUZE/BANDAGES/DRESSINGS) ×1
APL PRP STRL LF DISP 70% ISPRP (MISCELLANEOUS) ×1
BAG COUNTER SPONGE SURGICOUNT (BAG) IMPLANT
BAG DECANTER FOR FLEXI CONT (MISCELLANEOUS) IMPLANT
BAG SPEC THK2 15X12 ZIP CLS (MISCELLANEOUS)
BAG SPNG CNTER NS LX DISP (BAG)
BAG ZIPLOCK 12X15 (MISCELLANEOUS) IMPLANT
BEARING CROSSLINK RSA 28X44 (Joint) ×1 IMPLANT
BIT DRILL RINGLOC QUICK CONN (BIT) IMPLANT
BONE CHIP PRESERV 30CC PCAN30 (Bone Implant) ×2 IMPLANT
BRNG HIP F 44X28 LUM STRL (Joint) ×1 IMPLANT
CHLORAPREP W/TINT 26 (MISCELLANEOUS) ×2 IMPLANT
COVER PERINEAL POST (MISCELLANEOUS) ×2 IMPLANT
COVER SURGICAL LIGHT HANDLE (MISCELLANEOUS) ×2 IMPLANT
DECANTER SPIKE VIAL GLASS SM (MISCELLANEOUS) ×2 IMPLANT
DERMABOND ADVANCED (GAUZE/BANDAGES/DRESSINGS) ×1
DERMABOND ADVANCED .7 DNX12 (GAUZE/BANDAGES/DRESSINGS) ×2 IMPLANT
DRAPE IMP U-DRAPE 54X76 (DRAPES) ×2 IMPLANT
DRAPE SHEET LG 3/4 BI-LAMINATE (DRAPES) ×6 IMPLANT
DRAPE STERI IOBAN 125X83 (DRAPES) ×2 IMPLANT
DRAPE U-SHAPE 47X51 STRL (DRAPES) ×4 IMPLANT
DRILL BIT RINGLOC QUICK CONN (BIT) ×2
DRSG AQUACEL AG ADV 3.5X 6 (GAUZE/BANDAGES/DRESSINGS) ×2 IMPLANT
DRSG AQUACEL AG ADV 3.5X10 (GAUZE/BANDAGES/DRESSINGS) ×2 IMPLANT
ELECT REM PT RETURN 15FT ADLT (MISCELLANEOUS) ×2 IMPLANT
GAUZE SPONGE 4X4 12PLY STRL (GAUZE/BANDAGES/DRESSINGS) ×2 IMPLANT
GLOVE SRG 8 PF TXTR STRL LF DI (GLOVE) ×1 IMPLANT
GLOVE SURG ENC MOIS LTX SZ8.5 (GLOVE) ×4 IMPLANT
GLOVE SURG ENC TEXT LTX SZ7.5 (GLOVE) ×4 IMPLANT
GLOVE SURG UNDER POLY LF SZ8 (GLOVE) ×2
GLOVE SURG UNDER POLY LF SZ8.5 (GLOVE) ×2 IMPLANT
GOWN SPEC L3 XXLG W/TWL (GOWN DISPOSABLE) ×2 IMPLANT
GOWN STRL REUS W/TWL XL LVL3 (GOWN DISPOSABLE) ×2 IMPLANT
GRAFT BNE CANC CHIPS 1-8 30CC (Bone Implant) IMPLANT
HANDPIECE INTERPULSE COAX TIP (DISPOSABLE) ×2
HEAD MOD COCR 28MM HD +9MM NK (Orthopedic Implant) ×1 IMPLANT
HOLDER FOLEY CATH W/STRAP (MISCELLANEOUS) ×2 IMPLANT
HOOD PEEL AWAY FLYTE STAYCOOL (MISCELLANEOUS) ×8 IMPLANT
JET LAVAGE IRRISEPT WOUND (IRRIGATION / IRRIGATOR) ×2
KIT TURNOVER KIT A (KITS) IMPLANT
LAVAGE JET IRRISEPT WOUND (IRRIGATION / IRRIGATOR) ×1 IMPLANT
LINER ACETAB G7 44MM SZF (Liner) ×1 IMPLANT
LINER ACETAB VIT E +5 36 F (Liner) ×1 IMPLANT
MANIFOLD NEPTUNE II (INSTRUMENTS) ×2 IMPLANT
MARKER SKIN DUAL TIP RULER LAB (MISCELLANEOUS) ×2 IMPLANT
NDL SAFETY ECLIPSE 18X1.5 (NEEDLE) ×1 IMPLANT
NDL SPNL 18GX3.5 QUINCKE PK (NEEDLE) ×1 IMPLANT
NEEDLE HYPO 18GX1.5 SHARP (NEEDLE) ×2
NEEDLE SPNL 18GX3.5 QUINCKE PK (NEEDLE) ×2 IMPLANT
PACK ANTERIOR HIP CUSTOM (KITS) ×2 IMPLANT
PENCIL SMOKE EVACUATOR (MISCELLANEOUS) IMPLANT
PUTTY DBM STAGRAFT PLUS 10CC (Putty) ×1 IMPLANT
SAW OSC TIP CART 19.5X105X1.3 (SAW) ×2 IMPLANT
SCREW BN 30X6.5XST STRL ACTB (Screw) IMPLANT
SCREW BONE  6.5X30 (Screw) ×2 IMPLANT
SCREW BONE 6.5X20 (Screw) IMPLANT
SCREW BONE 6.5X30 (Screw) ×1 IMPLANT
SEALER BIPOLAR AQUA 6.0 (INSTRUMENTS) ×2 IMPLANT
SET HNDPC FAN SPRY TIP SCT (DISPOSABLE) ×1 IMPLANT
SHELL ACETAB MULTIHOLE 56MM S7 (Hips) ×1 IMPLANT
SPONGE T-LAP 18X18 ~~LOC~~+RFID (SPONGE) ×6 IMPLANT
STAPLER INSORB 30 2030 C-SECTI (MISCELLANEOUS) IMPLANT
STEM FEMORAL 17X154 (Stem) ×1 IMPLANT
SUT MNCRL AB 3-0 PS2 18 (SUTURE) ×2 IMPLANT
SUT MON AB 2-0 CT1 36 (SUTURE) ×2 IMPLANT
SUT STRATAFIX PDO 1 14 VIOLET (SUTURE) ×2
SUT STRATFX PDO 1 14 VIOLET (SUTURE) ×1
SUT VIC AB 2-0 CT1 27 (SUTURE)
SUT VIC AB 2-0 CT1 TAPERPNT 27 (SUTURE) IMPLANT
SUTURE STRATFX PDO 1 14 VIOLET (SUTURE) ×1 IMPLANT
SYR 3ML LL SCALE MARK (SYRINGE) ×2 IMPLANT
TRAY FOLEY MTR SLVR 16FR STAT (SET/KITS/TRAYS/PACK) IMPLANT
TUBE SUCTION HIGH CAP CLEAR NV (SUCTIONS) ×2 IMPLANT
WATER STERILE IRR 1000ML POUR (IV SOLUTION) ×2 IMPLANT

## 2021-07-09 NOTE — Op Note (Signed)
OPERATIVE REPORT  SURGEON: Rod Can, MD   ASSISTANT: Cherlynn June, PA-C  PREOPERATIVE DIAGNOSIS: 1. Displaced Right femoral neck fracture.  2.  Right acetabulum fracture. 3.  Right hip osteoarthritis. 4.  Right femoral head impaction fracture.  POSTOPERATIVE DIAGNOSIS: Same.  PROCEDURE: Right total hip arthroplasty, anterior approach.   IMPLANTS: Biomet Taperloc Reduced Distal stem, size 17 x 154, high offset. Biomet G7 OsseoTi Cup, size 56 mm. Biomet Dual mobility metal liner, size 44 mm, F, neutral. Biomet Dual Mobility Vivacit-E bearing, 44 mm, with metal head ball, size 28 + 9 mm. 6.5 mm cancellous bone screw x1.  ANESTHESIA:  General  ANTIBIOTICS: 2g ancef.  ESTIMATED BLOOD LOSS:-500 mL    DRAINS: None.  COMPLICATIONS: None   CONDITION: PACU - hemodynamically stable.   BRIEF CLINICAL NOTE: Gary Stevenson is a 81 y.o. male with a displaced Right femoral neck fracture, right acetabulum fracture, impacted femoral head fracture, and pre-existing hip osteoarthritis.  He was seen in the preadmission testing clinic by anesthesia yesterday, and his hemoglobin was found to be 7.5.  He has acute blood loss anemia on chronic anemia.  He was admitted to the hospital last night and received 2 units of packed red blood cells.  Hemoglobin this morning was greater than 10. The risks, benefits, and alternatives to total hip arthroplasty were explained, and the patient elected to proceed.  PROCEDURE IN DETAIL: The patient was taken to the operating room and general anesthesia was induced on the hospital bed.  The patient was then positioned on the Hana table.  All bony prominences were well padded.  The hip was prepped and draped in the normal sterile surgical fashion.  A time-out was called verifying side and site of surgery. Antibiotics were given within 60 minutes of beginning the procedure.   Bikini incision was made, and the direct anterior approach to the hip was performed  through the Hueter interval.  Lateral femoral circumflex vessels were treated with the Auqumantys. The anterior capsule was exposed and an inverted T capsulotomy was made.  Fracture hematoma was encountered and evacuated. The patient was found to have a comminuted Right subcapital femoral neck fracture.  I freshened the femoral neck cut with a saw.  I removed the femoral neck fragment.  A corkscrew was placed into the head and the head was removed.  This was passed to the back table and was measured.  The femoral head and neck fragments were sent to pathology for frozen section, and were negative for tumor.  The pubofemoral ligament was released subperiosteally to the lesser trochanter.  Acetabular exposure was achieved, and the pulvinar and labrum were excised.  There was a comminuted acetabulum fracture with fibrous healing tissue involving the superior dome and posterior column.  Sequential reaming of the acetabulum was then performed up to a size 55 mm reamer under direct visulization.  30 cc of cancellous allograft bone chips were mixed with 10 cc of Zimmer Biomet DBM putty.  The bone graft was placed into the native acetabulum.  The graft was impacted with a 54 mm reamer on reverse.  A 56 mm cup was then opened and impacted into place at approximately 40 degrees of abduction and 20 degrees of anteversion.  The cup achieved excellent press-fit stability, and I chose to augment the fixation with a single 6.5 mm cancellous screw.  The final polyethylene liner was impacted into place and acetabular osteophytes were removed.    I then gained femoral exposure taking care  to protect the abductors and greater trochanter.  This was performed using standard external rotation, extension, and adduction.  A cookie cutter was used to enter the femoral canal, and then the femoral canal finder was placed.  Sequential broaching was performed up to a size 17.  Calcar planer was used on the femoral neck remnant.  I placed a  high offset neck and a trial dual mobility construct.  The hip was reduced.  Leg lengths and offset were checked fluoroscopically.  The hip was dislocated and trial components were removed.  The real dual mobility bearing was assembled on the back table.  The final implants were placed, and the hip was reduced.  Fluoroscopy was used to confirm component position and leg lengths.  At 90 degrees of external rotation and full extension, the hip was stable to an anterior directed force.   The wound was copiously irrigated with Irrisept solution and normal saline using pule lavage.  Marcaine solution was injected into the periarticular soft tissue.  The wound was closed in layers using #1 Stratafix for the fascia, 2-0 Monocryl for the deep dermal layer, and 2-0 nylon vertical mattress sutures plus staples for the skin.  Once the glue was fully dried, an Aquacell Ag dressing was applied.  The patient was transported to the recovery room in stable condition.  Sponge, needle, and instrument counts were correct at the end of the case x2.  The patient tolerated the procedure well and there were no known complications.  Please note that a surgical assistant was a medical necessity for this procedure to perform it in a safe and expeditious manner. Assistant was necessary to provide appropriate retraction of vital neurovascular structures, to prevent femoral fracture, and to allow for anatomic placement of the prosthesis.

## 2021-07-09 NOTE — Anesthesia Preprocedure Evaluation (Addendum)
Anesthesia Evaluation  Patient identified by MRN, date of birth, ID band Patient awake    Reviewed: Allergy & Precautions, NPO status , Patient's Chart, lab work & pertinent test results  Airway Mallampati: I  TM Distance: >3 FB Neck ROM: Full    Dental  (+) Edentulous Upper, Edentulous Lower   Pulmonary COPD, former smoker,    breath sounds clear to auscultation       Cardiovascular hypertension,  Rhythm:Regular Rate:Normal     Neuro/Psych negative neurological ROS  negative psych ROS   GI/Hepatic negative GI ROS, Neg liver ROS,   Endo/Other  negative endocrine ROS  Renal/GU negative Renal ROS     Musculoskeletal  (+) Arthritis ,   Abdominal Normal abdominal exam  (+)   Peds  Hematology   Anesthesia Other Findings   Reproductive/Obstetrics                            Anesthesia Physical Anesthesia Plan  ASA: 3  Anesthesia Plan: General   Post-op Pain Management:    Induction: Intravenous  PONV Risk Score and Plan: 3 and Ondansetron and Treatment may vary due to age or medical condition  Airway Management Planned: Oral ETT  Additional Equipment: None  Intra-op Plan:   Post-operative Plan: Extubation in OR  Informed Consent: I have reviewed the patients History and Physical, chart, labs and discussed the procedure including the risks, benefits and alternatives for the proposed anesthesia with the patient or authorized representative who has indicated his/her understanding and acceptance.     Dental advisory given  Plan Discussed with: CRNA  Anesthesia Plan Comments:       Anesthesia Quick Evaluation

## 2021-07-09 NOTE — H&P (Signed)
ORTHOPAEDIC H&P  ADMITTING PHYSICIAN: Rod Can, MD  PCP:  Seward Carol, MD  Chief Complaint: 1. Acute blood loss on chronic anemia 2. Displaced Right femoral neck fracture 3. Right acetabulum fracture  HPI: Gary Stevenson is a 81 y.o. male who sustained a ground-level fall in October 2022.  He was subsequently seen at an urgent care facility for right hip pain.  X-rays were obtained.  He had continued right hip pain and inability weightbearing.  He also had progressive neck and back pain.  He went to the [facility] emergency department on 05/27/2021.  He was found to have a C2 fracture and a lumbar compression fracture which are being treated by neurosurgery.  He had a CT scan of the right hip which revealed a minimally displaced right acetabulum fracture as well as some depression of the superior femoral head, which at that time, were amenable to conservative treatment.  I saw him in the office on 06/09/2021, and x-rays confirmed no change.  About a week ago, he had a second fall onto the right hip.  He had increasing right hip pain, and I saw him again in the office on 07/03/2021.  Repeat x-rays showed a displaced femoral neck fracture and some elevation of his hip center of rotation, and I felt this was due to displacement of the acetabulum fracture.  Since his pain was controlled at rest and his daughter has been able to care for him without any significant issues, we planned for an outpatient CT scan and elective total hip replacement surgery.  He was seen in the anesthesia preop clinic yesterday, and his hemoglobin was found to be 7.5.  He has a history of chronic anemia which is managed by his primary medical team, but he currently has acute blood loss anemia due to the hip and acetabulum fracture on top of chronic anemia.  He was therefore admitted to the hospital last night.  He has received 2 units of packed red blood cells in preparation for surgery.  He had a new CT scan of the  right hip last night.  Past Medical History:  Diagnosis Date   Arthritis    Brain aneurysm    ETOH abuse    intoxicated   Lung cancer [facility])    Past Surgical History:  Procedure Laterality Date   BRAIN SURGERY     COLONOSCOPY N/A 03/18/2014   Procedure: COLONOSCOPY;  Surgeon: Juanita Craver, MD;  Location: [facility] ENDOSCOPY;  Service: Endoscopy;  Laterality: N/A;   ESOPHAGOGASTRODUODENOSCOPY N/A 03/18/2014   Procedure: ESOPHAGOGASTRODUODENOSCOPY (EGD);  Surgeon: Juanita Craver, MD;  Location: [facility] ENDOSCOPY;  Service: Endoscopy;  Laterality: N/A;   LUNG SURGERY     Social History   Socioeconomic History   Marital status: Single    Spouse name: Not on file   Number of children: Not on file   Years of education: Not on file   Highest education level: Not on file  Occupational History   Not on file  Tobacco Use   Smoking status: Former   Smokeless tobacco: Never  Vaping Use   Vaping Use: Never used  Substance and Sexual Activity   Alcohol use: Yes    Comment: "I drink whenever I have money"   Drug use: No   Sexual activity: Not on file  Other Topics Concern   Not on file  Social History Narrative   Not on file   Social Determinants of Health   Financial Resource Strain: Not on  file  Food Insecurity: Not on file  Transportation Needs: Not on file  Physical Activity: Not on file  Stress: Not on file  Social Connections: Not on file   No family history on file. No Known Allergies Prior to Admission medications   Medication Sig Start Date End Date Taking? Authorizing Provider  HYDROcodone-acetaminophen (NORCO/VICODIN) 5-325 MG tablet Take 1 tablet by mouth every 6 (six) hours as needed for moderate pain. 05/27/21  Yes Fredia Sorrow, MD  naproxen sodium (ALEVE) 220 MG tablet Take 220 mg by mouth daily as needed (pain).   Yes [provider]  indomethacin (INDOCIN) 50 MG capsule Take 1 capsule (50 mg total) by mouth 2 (two) times daily as needed. Patient not taking:  Reported on 07/03/2021 02/19/21   Volney American, PA-C  meloxicam (MOBIC) 15 MG tablet Take 1 tablet (15 mg total) by mouth daily. Patient not taking: Reported on 07/03/2021 12/17/20   Wallene Huh, DPM   CT HIP RIGHT WO CONTRAST  Result Date: 07/12/2021 CLINICAL DATA:  Right hip fracture, surgical planning EXAM: CT OF THE RIGHT HIP WITHOUT CONTRAST TECHNIQUE: Multidetector CT imaging of the right hip was performed according to the standard protocol. Multiplanar CT image reconstructions were also generated. RADIATION DOSE REDUCTION: This exam was performed according to the departmental dose-optimization program which includes automated exposure control, adjustment of the mA and/or kV according to patient size and/or use of iterative reconstruction technique. COMPARISON:  05/27/2021 FINDINGS: Bones/Joint/Cartilage Anterior column fracture of the right acetabulum noted with segmental involvement of the anterior wall and vertical component extending cephalad in the right iliac bone extending towards the anterior portion of the right SI joint, and with transverse fracture of the inferior pubic ramus. Equivocal posterior column involvement with scalloping the posterior acetabular margin and bony demineralization with some subtle areas of cortical irregularity. There is some signs of interval healing response with demineralization along the fracture planes and some mild osteoid deposition along the anterior column fracture planes. Acetabular demineralization with potential comminuted small fracture fragments along the acetabular roof for example on image 33 series 7. Right femoral neck fracture is observed with varus and apex anterior angulation, as well as resorption of much of the right femoral neck and a substantial portion of the right femoral head especially superiorly. This results in a moderate gap between the remaining femoral head fragment in the inter trochanteric region, with pseudoarticulation of a  small extension of the femoral neck with the inter trochanteric region shown on image 36 series 7. Substantial increase in osteoid deposition within the distended joint capsule. Substantial osteoid deposition in the right hip adductor musculature potentially a harbinger of heterotopic ossification. Ligaments Suboptimally assessed by CT. Muscles and Tendons Substantial osteoid deposition and probably early heterotopic calcification in the right hip adductor musculature. Soft tissues Dense atherosclerotic calcifications. Suspected right posterior bladder diverticulum. IMPRESSION: 1. Comminuted anterior column fracture of the right acetabulum, likely with involvement of the acetabular roof and with questionable posterior column involvement. Substantial erosion along the acetabulum with increased deposition of osteoid in the distended joint. 2. Femoral neck fracture with resorption of most of the femoral neck and a portion of the femoral head. 3. Osteoid deposition of possibly early heterotopic calcification in the right hip adductor musculature. 4. Extensive atherosclerosis. Electronically Signed   By: Van Clines M.D.   On: 06/29/2021 07:59    Positive ROS: All other systems have been reviewed and were otherwise negative with the exception of those mentioned in  the HPI and as above.  Physical Exam: General: Alert, no acute distress Cardiovascular: No pedal edema Respiratory: No cyanosis, no use of accessory musculature GI: No organomegaly, abdomen is soft and non-tender Skin: No lesions in the area of chief complaint Neurologic: Sensation intact distally Psychiatric: Patient is competent for consent with normal mood and affect Lymphatic: No axillary or cervical lymphadenopathy  MUSCULOSKELETAL: Examination of the right hip reveals no skin wounds or lesions. No trochanteric tenderness to palpation. He cannot perform a straight leg raise. He does have some pain with logrolling of the hip.  There  is a leg length discrepancy, right shorter than left.  He is able to stand from a seated position and the wheelchair with assistance.  Neurovascularly intact distally.   Assessment: Right acetabulum fracture, with interval displacement and elevation of the hip center of rotation.  Displaced right femoral neck fracture. Right femoral head fracture with collapse.  Right hip osteoarthritis.  Acute blood loss anemia on chronic anemia.  Plan: The patient was admitted last night and has received 2 units packed red blood cells.  A.m. hemoglobin is greater than 10.  Plan for surgery today.  Continue NPO.  Acetabular bone stock will be evaluated intraoperatively.  He may need approximately 12 weeks of protected weightbearing depending on intraoperative findings.  The risks, benefits, and alternatives were discussed with the patient. There are risks associated with the surgery including, but not limited to, problems with anesthesia (death), infection, instability (giving out of the joint), dislocation, differences in leg length/angulation/rotation, fracture of bones, loosening or failure of implants, hematoma (blood accumulation) which may require surgical drainage, blood clots, pulmonary embolism, nerve injury (foot drop and lateral thigh numbness), and blood vessel injury. The patient understands these risks and elects to proceed.  Bertram Savin, MD [phone]    07/15/2021 10:05 AM

## 2021-07-09 NOTE — Anesthesia Postprocedure Evaluation (Addendum)
Anesthesia Post Note  Patient: Gary Stevenson  Procedure(s) Performed: TOTAL HIP ARTHROPLASTY ANTERIOR APPROACH (Right: Hip)     Patient location during evaluation: PACU Anesthesia Type: Spinal Pain management: pain level controlled Vital Signs Assessment: post-procedure vital signs reviewed and stable Respiratory status: spontaneous breathing, nonlabored ventilation, respiratory function stable and patient connected to nasal cannula oxygen Cardiovascular status: blood pressure returned to baseline and stable Postop Assessment: no apparent nausea or vomiting Anesthetic complications: no   No notable events documented.  Last Vitals:  Vitals:   07/24/2021 1745 07/21/2021 1801  BP: (!) 93/59 102/64  Pulse: 77 75  Resp: 20 16  Temp: (!) 35.6 C (!) 36.4 C  SpO2: 100% 100%    Last Pain:  Vitals:   07/21/2021 1745  TempSrc:   PainSc: [place] Lauris Serviss

## 2021-07-09 NOTE — Progress Notes (Signed)
Patient's daughter [facility]) called and blood consent as well as surgical consent obtained and signed with second RN witness.

## 2021-07-09 NOTE — Anesthesia Procedure Notes (Signed)
Procedure Name: Intubation Date/Time: 07/01/2021 2:06 PM Performed by: Lavina Hamman, CRNA Pre-anesthesia Checklist: Patient identified, Emergency Drugs available, Suction available, Patient being monitored and Timeout performed Patient Re-evaluated:Patient Re-evaluated prior to induction Oxygen Delivery Method: Circle system utilized Preoxygenation: Pre-oxygenation with 100% oxygen Induction Type: IV induction Ventilation: Mask ventilation without difficulty Laryngoscope Size: Mac and 3 Grade View: Grade II Tube type: Oral Tube size: 7.0 mm Number of attempts: 1 Airway Equipment and Method: Stylet Placement Confirmation: ETT inserted through vocal cords under direct vision, positive ETCO2, CO2 detector and breath sounds checked- equal and bilateral Secured at: 23 cm Tube secured with: Tape Dental Injury: Teeth and Oropharynx as per pre-operative assessment

## 2021-07-09 NOTE — Transfer of Care (Signed)
Immediate Anesthesia Transfer of Care Note  Patient: Gary Stevenson  Procedure(s) Performed: TOTAL HIP ARTHROPLASTY ANTERIOR APPROACH (Right: Hip)  Patient Location: PACU  Anesthesia Type:General  Level of Consciousness: awake  Airway & Oxygen Therapy: Patient Spontanous Breathing and Patient connected to face mask oxygen  Post-op Assessment: Report given to RN, Post -op Vital signs reviewed and stable and Patient moving all extremities X 4  Post vital signs: Reviewed and stable  Last Vitals:  Vitals Value Taken Time  BP 134/79 07/24/2021 1654  Temp    Pulse 65   Resp 13 07/07/2021 1655  SpO2 100   Vitals shown include unvalidated device data.  Last Pain:  Vitals:   07/22/2021 1000  TempSrc: Oral  PainSc: 8       Patients Stated Pain Goal: 2 (70/96/28 3662)  Complications: No notable events documented.

## 2021-07-09 NOTE — Progress Notes (Signed)
Received report from [facility] for surgery

## 2021-07-09 NOTE — Progress Notes (Signed)
Initial Nutrition Assessment  DOCUMENTATION CODES:   Severe malnutrition in context of chronic illness, Underweight  INTERVENTION:  - diet advancement as medically feasible post-op. - will order Ensure Enlive TID, each supplement provides 350 kcal and 20 grams of protein.   NUTRITION DIAGNOSIS:   Severe Malnutrition related to chronic illness as evidenced by severe fat depletion, severe muscle depletion.  GOAL:   Patient will meet greater than or equal to 90% of their needs  MONITOR:   Diet advancement, PO intake, Supplement acceptance, Labs, Weight trends, Skin  REASON FOR ASSESSMENT:   Consult Hip fracture protocol  ASSESSMENT:   81 y.o. male with medical history of alcohol abuse, lung cancer, brain aneurysm, and arthritis. He was admitted due to low hemoglobin with hx of chronic anemia in preparation for elective total R hip replacement surgery.  Regular diet, thin liquids ordered yesterday at 1735 and he was changed to NPO at midnight. No meal completion documented from dinner last night.  Patient laying in bed with wife and daughter at bedside. Flow sheet documentation indicates patient is a/o to person and place. His daughter provides most of the information.  Patient has a poor appetite at baseline which has been the case for as long as she can remember. He requires ongoing encouragement to eat each time he is provided with food. He has no chewing or swallowing difficulties at baseline.   He is encouraged to drink water, orange juice, and Ensure. He greatly enjoys Ensure; family and patient receptive to order being placed for post-op.   The last time he ate was ~1600 yesterday when he had fried chicken and several side items.   Weight yesterday was documented as 100 lb. Patient reports UBW is higher than this and that he has lost weight and noticed that his clothes are fitting looser. PTA the most recently documented weight was in 02/2014.  He has no teeth but does  report having dentures.   Patient and family deny any further nutrition-related questions or concerns at this time.    Labs reviewed; BUN: 81 mg/dl, creatinine: 2.84 mg/dl, Ca: 8.5 mg/dl, Alk Phos elevated, GFR: 55 ml/min.  Medications reviewed.  IVF; NS @ 75 ml/hr.     NUTRITION - FOCUSED PHYSICAL EXAM:  Flowsheet Row Most Recent Value  Orbital Region Moderate depletion  Upper Arm Region Severe depletion  Thoracic and Lumbar Region Unable to assess  Buccal Region Severe depletion  Temple Region Moderate depletion  Clavicle Bone Region Severe depletion  Clavicle and Acromion Bone Region Severe depletion  Scapular Bone Region Unable to assess  Dorsal Hand Severe depletion  Patellar Region Moderate depletion  Anterior Thigh Region Severe depletion  Posterior Calf Region Severe depletion  Edema (RD Assessment) None  Hair Reviewed  Eyes Reviewed  Mouth Reviewed  [no teeth,  has dentures]  Skin Reviewed  Nails Reviewed       Diet Order:   Diet Order             Diet NPO time specified  Diet effective midnight                   EDUCATION NEEDS:   No education needs have been identified at this time  Skin:  Skin Assessment: Reviewed RN Assessment  Last BM:  PTA/unknown  Height:   Ht Readings from Last 1 Encounters:  07/19/2021 5\' 5"  (1.651 m)    Weight:   Wt Readings from Last 1 Encounters:  07/16/2021 45.5 kg  Initial Nutrition Assessment ° °DOCUMENTATION CODES:  ° °Severe malnutrition in context of chronic illness, Underweight ° °INTERVENTION:  °- diet advancement as medically feasible post-op. °- will order Ensure Enlive TID, each supplement provides 350 kcal and 20 grams of protein. ° ° °NUTRITION DIAGNOSIS:  ° °Severe Malnutrition related to chronic illness as evidenced by severe fat depletion, severe muscle depletion. ° °GOAL:  ° °Patient will meet greater than or equal to 90% of their needs ° °MONITOR:  ° °Diet advancement, PO intake, Supplement acceptance, Labs, Weight trends, Skin ° °REASON FOR ASSESSMENT:  ° °Consult °Hip fracture protocol ° °ASSESSMENT:  ° °80 y.o. male with medical history of alcohol abuse, lung cancer, brain aneurysm, and arthritis. He was admitted due to low hemoglobin with hx of chronic anemia in preparation for elective total R hip replacement surgery. ° °Regular diet, thin liquids ordered yesterday at 1735 and he was changed to NPO at midnight. No meal completion documented from dinner last night. ° °Patient laying in bed with wife and daughter at bedside. Flow sheet documentation indicates patient is a/o to person and place. His daughter provides most of the information. ° °Patient has a poor appetite at baseline which has been the case for as long as she can remember. He requires ongoing encouragement to eat each time he is provided with food. He has no chewing or swallowing difficulties at baseline.  ° °He is encouraged to drink water, orange juice, and Ensure. He greatly enjoys Ensure; family and patient receptive to order being placed for post-op.  ° °The last time he ate was ~1600 yesterday when he had fried chicken and several side items.  ° °Weight yesterday was documented as 100 lb. Patient reports UBW is higher than this and that he has lost weight and noticed that his clothes are fitting looser. PTA the most recently documented weight was in 02/2014. ° °He has no teeth but does  report having dentures.  ° °Patient and family deny any further nutrition-related questions or concerns at this time.  ° ° °Labs reviewed; BUN: 81 mg/dl, creatinine: 1.32 mg/dl, Ca: 8.5 mg/dl, Alk Phos elevated, GFR: 55 ml/min.  °Medications reviewed.  °IVF; NS @ 75 ml/hr.  °  ° °NUTRITION - FOCUSED PHYSICAL EXAM: ° °Flowsheet Row Most Recent Value  °Orbital Region Moderate depletion  °Upper Arm Region Severe depletion  °Thoracic and Lumbar Region Unable to assess  °Buccal Region Severe depletion  °Temple Region Moderate depletion  °Clavicle Bone Region Severe depletion  °Clavicle and Acromion Bone Region Severe depletion  °Scapular Bone Region Unable to assess  °Dorsal Hand Severe depletion  °Patellar Region Moderate depletion  °Anterior Thigh Region Severe depletion  °Posterior Calf Region Severe depletion  °Edema (RD Assessment) None  °Hair Reviewed  °Eyes Reviewed  °Mouth Reviewed  [no teeth,  has dentures]  °Skin Reviewed  °Nails Reviewed  ° °  ° ° °Diet Order:   °Diet Order   ° °       °  Diet NPO time specified  Diet effective midnight       °  ° °  °  ° °  ° ° °EDUCATION NEEDS:  ° °No education needs have been identified at this time ° °Skin:  Skin Assessment: Reviewed RN Assessment ° °Last BM:  PTA/unknown ° °Height:  ° °Ht Readings from Last 1 Encounters:  °07/12/2021 5' 5" (1.651 m)  ° ° °Weight:  ° °Wt Readings from Last 1 Encounters:  °07/20/2021 45.5 kg  ° ° ° °

## 2021-07-09 NOTE — Discharge Instructions (Signed)
? ?Dr. Yuji Walth ?Joint Replacement Specialist ?[facility] ?[address] ?[place], [place] [address] ?[phone] ? ? ?TOTAL HIP REPLACEMENT POSTOPERATIVE DIRECTIONS ? ? ? ?Hip Rehabilitation, Guidelines Following Surgery  ? ?WEIGHT BEARING ?Weight bearing as tolerated with assist device (walker, cane, etc) as directed, use it as long as suggested by your surgeon or therapist, typically at least 4-6 weeks. ? ?The results of a hip operation are greatly improved after range of motion and muscle strengthening exercises. Follow all safety measures which are given to protect your hip. If any of these exercises cause increased pain or swelling in your joint, decrease the amount until you are comfortable again. Then slowly increase the exercises. Call your caregiver if you have problems or questions.  ? ?HOME CARE INSTRUCTIONS  ?Most of the following instructions are designed to prevent the dislocation of your new hip.  ?Remove items at home which could result in a fall. This includes throw rugs or furniture in walking pathways.  ?Continue medications as instructed at time of discharge. ?You may have some home medications which will be placed on hold until you complete the course of blood thinner medication. ?You may start showering once you are discharged home. Do not remove your dressing. ?Do not put on socks or shoes without following the instructions of your caregivers.   ?Sit on chairs with arms. Use the chair arms to help push yourself up when arising.  ?Arrange for the use of a toilet seat elevator so you are not sitting low.  ?Walk with walker as instructed.  ?You may resume a sexual relationship in one month or when given the OK by your caregiver.  ?Use walker as long as suggested by your caregivers.  ?You may put full weight on your legs and walk as much as is comfortable. ?Avoid periods of inactivity such as sitting longer than an hour when not asleep. This helps prevent blood  clots.  ?You may return to work once you are cleared by your surgeon.  ?Do not drive a car for 6 weeks or until released by your surgeon.  ?Do not drive while taking narcotics.  ?Wear elastic stockings for two weeks following surgery during the day but you may remove then at night.  ?Make sure you keep all of your appointments after your operation with all of your doctors and caregivers. You should call the office at the above phone number and make an appointment for approximately two weeks after the date of your surgery. ?Please pick up a stool softener and laxative for home use as long as you are requiring pain medications. ?ICE to the affected hip every three hours for 30 minutes at a time and then as needed for pain and swelling. Continue to use ice on the hip for pain and swelling from surgery. You may notice swelling that will progress down to the foot and ankle.  This is normal after surgery.  Elevate the leg when you are not up walking on it.   ?It is important for you to complete the blood thinner medication as prescribed by your doctor. ?Continue to use the breathing machine which will help keep your temperature down.  It is common for your temperature to cycle up and down following surgery, especially at night when you are not up moving around and exerting yourself.  The breathing machine keeps your lungs expanded and your temperature down. ? ?RANGE OF MOTION AND STRENGTHENING EXERCISES  ?These exercises are designed to help you   keep full movement of your hip joint. Follow your caregiver's or physical therapist's instructions. Perform all exercises about fifteen times, three times per day or as directed. Exercise both hips, even if you have had only one joint replacement. These exercises can be done on a training (exercise) mat, on the floor, on a table or on a bed. Use whatever works the best and is most comfortable for you. Use music or television while you are exercising so that the exercises are a  pleasant break in your day. This will make your life better with the exercises acting as a break in routine you can look forward to.  ?Lying on your back, slowly slide your foot toward your buttocks, raising your knee up off the floor. Then slowly slide your foot back down until your leg is straight again.  ?Lying on your back spread your legs as far apart as you can without causing discomfort.  ?Lying on your side, raise your upper leg and foot straight up from the floor as far as is comfortable. Slowly lower the leg and repeat.  ?Lying on your back, tighten up the muscle in the front of your thigh (quadriceps muscles). You can do this by keeping your leg straight and trying to raise your heel off the floor. This helps strengthen the largest muscle supporting your knee.  ?Lying on your back, tighten up the muscles of your buttocks both with the legs straight and with the knee bent at a comfortable angle while keeping your heel on the floor.  ? ?SKILLED REHAB INSTRUCTIONS: ?If the patient is transferred to a skilled rehab facility following release from the hospital, a list of the current medications will be sent to the facility for the patient to continue.  When discharged from the skilled rehab facility, please have the facility set up the patient's Home Health Physical Therapy prior to being released. Also, the skilled facility will be responsible for providing the patient with their medications at time of release from the facility to include their pain medication and their blood thinner medication. If the patient is still at the rehab facility at time of the two week follow up appointment, the skilled rehab facility will also need to assist the patient in arranging follow up appointment in our office and any transportation needs. ? ?POST-OPERATIVE OPIOID TAPER INSTRUCTIONS: ?It is important to wean off of your opioid medication as soon as possible. If you do not need pain medication after your surgery it is ok  to stop day one. ?Opioids include: ?Codeine, Hydrocodone(Norco, Vicodin), Oxycodone(Percocet, oxycontin) and hydromorphone amongst others.  ?Long term and even short term use of opiods can cause: ?Increased pain response ?Dependence ?Constipation ?Depression ?Respiratory depression ?And more.  ?Withdrawal symptoms can include ?Flu like symptoms ?Nausea, vomiting ?And more ?Techniques to manage these symptoms ?Hydrate well ?Eat regular healthy meals ?Stay active ?Use relaxation techniques(deep breathing, meditating, yoga) ?Do Not substitute Alcohol to help with tapering ?If you have been on opioids for less than two weeks and do not have pain than it is ok to stop all together.  ?Plan to wean off of opioids ?This plan should start within one week post op of your joint replacement. ?Maintain the same interval or time between taking each dose and first decrease the dose.  ?Cut the total daily intake of opioids by one tablet each day ?Next start to increase the time between doses. ?The last dose that should be eliminated is the evening dose.  ? ? ?MAKE   SURE YOU:  ?Understand these instructions.  ?Will watch your condition.  ?Will get help right away if you are not doing well or get worse. ? ?Pick up stool softner and laxative for home use following surgery while on pain medications. ?Do not remove your dressing. ?The dressing is waterproof--it is OK to take showers. ?Continue to use ice for pain and swelling after surgery. ?Do not use any lotions or creams on the incision until instructed by your surgeon. ?Total Hip Protocol. ? ?

## 2021-07-09 NOTE — Progress Notes (Signed)
MD notified due to patient being very drowsy, also unable to administer night time medications. MD ordered to monitor patient. No new orders obtained.  Vital signs are stable. Blood sugar 160. Will continue supplemental oxygen for now.

## 2021-07-10 ENCOUNTER — Inpatient Hospital Stay (HOSPITAL_COMMUNITY): Payer: Medicare HMO

## 2021-07-10 ENCOUNTER — Inpatient Hospital Stay (HOSPITAL_COMMUNITY)
Admission: RE | Admit: 2021-07-10 | Discharge: 2021-07-10 | Disposition: A | Discharge status: Home / Self Care | Payer: Medicare HMO | Source: Home / Self Care | Attending: Physician Assistant | Admitting: Physician Assistant

## 2021-07-10 DIAGNOSIS — R4182 Altered mental status, unspecified: Secondary | ICD-10-CM

## 2021-07-10 LAB — CBC
HCT: 21.8 % — ABNORMAL LOW (ref 39.0–52.0)
Hemoglobin: 7.2 g/dL — ABNORMAL LOW (ref 13.0–17.0)
MCH: 28.7 pg (ref 26.0–34.0)
MCHC: 33 g/dL (ref 30.0–36.0)
MCV: 86.9 fL (ref 80.0–100.0)
Platelets: 313 10*3/uL (ref 150–400)
RBC: 2.51 MIL/uL — ABNORMAL LOW (ref 4.22–5.81)
RDW: 15.8 % — ABNORMAL HIGH (ref 11.5–15.5)
WBC: 11.6 10*3/uL — ABNORMAL HIGH (ref 4.0–10.5)
nRBC: 0 % (ref 0.0–0.2)

## 2021-07-10 LAB — T4, FREE: Free T4: 1.03 ng/dL (ref 0.61–1.12)

## 2021-07-10 LAB — BASIC METABOLIC PANEL
Anion gap: 14 (ref 5–15)
BUN: 73 mg/dL — ABNORMAL HIGH (ref 8–23)
CO2: 18 mmol/L — ABNORMAL LOW (ref 22–32)
Calcium: 7.6 mg/dL — ABNORMAL LOW (ref 8.9–10.3)
Chloride: 105 mmol/L (ref 98–111)
Creatinine, Ser: 1.65 mg/dL — ABNORMAL HIGH (ref 0.61–1.24)
GFR, Estimated: 42 mL/min — ABNORMAL LOW (ref >60)
Glucose, Bld: 134 mg/dL — ABNORMAL HIGH (ref 70–99)
Potassium: 5.4 mmol/L — ABNORMAL HIGH (ref 3.5–5.1)
Sodium: 137 mmol/L (ref 135–145)

## 2021-07-10 LAB — VITAMIN B12: Vitamin B-12: 331 pg/mL (ref 180–914)

## 2021-07-10 LAB — TSH: TSH: 2.94 u[IU]/mL (ref 0.350–4.500)

## 2021-07-10 LAB — MAGNESIUM: Magnesium: 1.9 mg/dL (ref 1.7–2.4)

## 2021-07-10 LAB — FOLATE: Folate: 11.1 ng/mL (ref >5.9)

## 2021-07-10 LAB — GLUCOSE, CAPILLARY: Glucose-Capillary: 135 mg/dL — ABNORMAL HIGH (ref 70–99)

## 2021-07-10 IMAGING — CT CT HEAD W/O CM
4 series · 16 of 47 positions shown, 18 images · non-contrast
Comparison: [DATE]

CLINICAL DATA: Seizure, new onset.



[Series 2: head wo · axial · 0.47mm/px · z∈[-111,+9]mm · 7 of 32 slices shown, 9 images]
[im 4/32  brain]
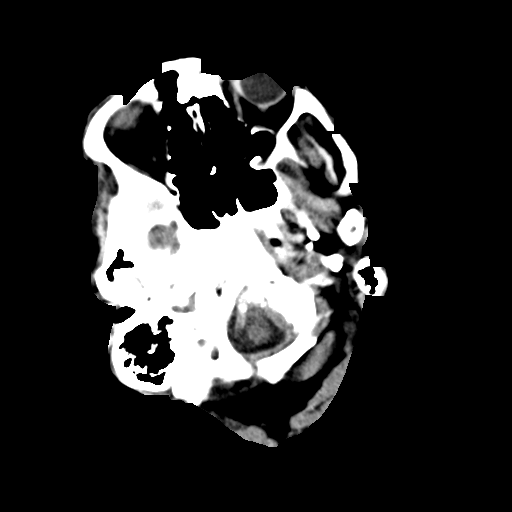
[im 4/32  bone]
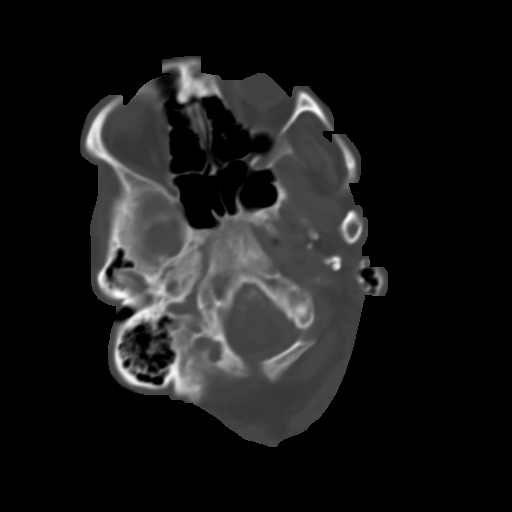
[im 8/32  brain]
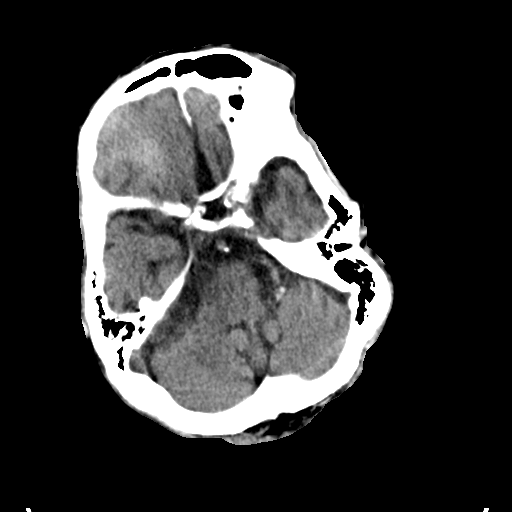
[im 12/32  brain]
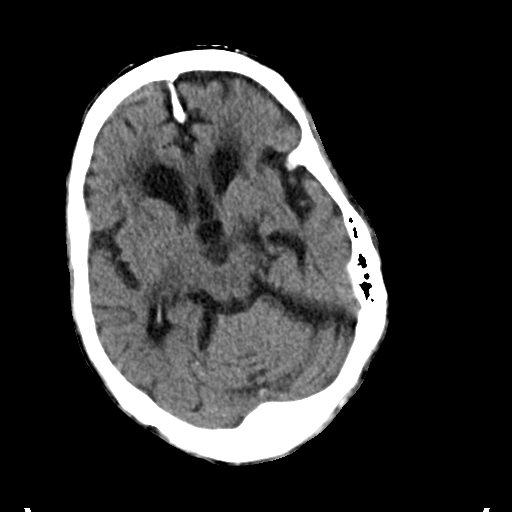
[im 16/32  brain]
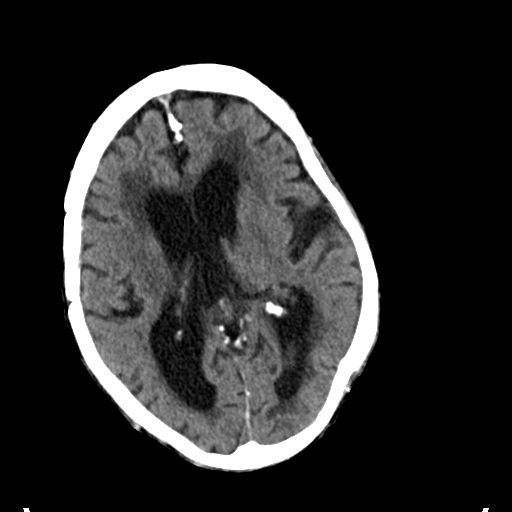
[im 20/32  brain]
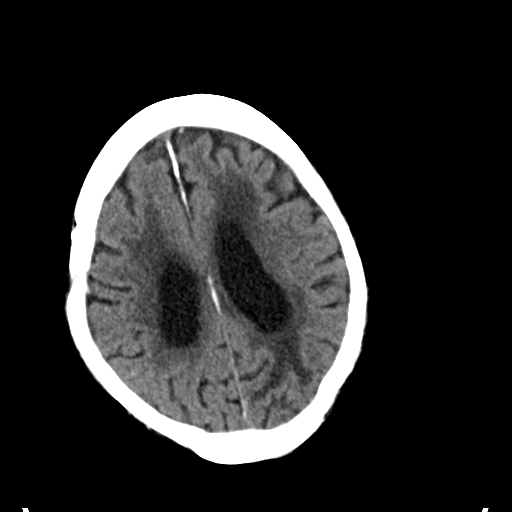
[im 20/32  bone]
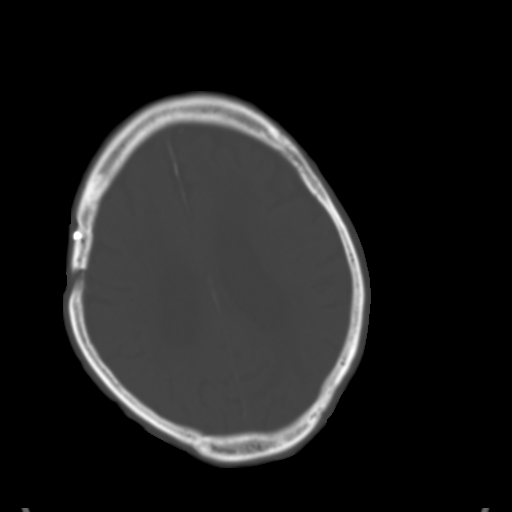
[im 24/32  brain]
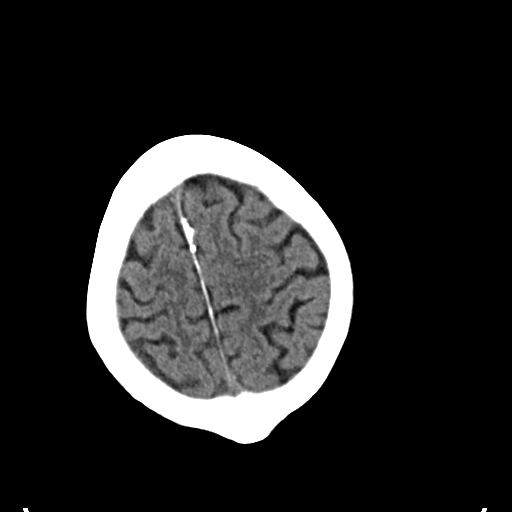
[im 28/32  brain]
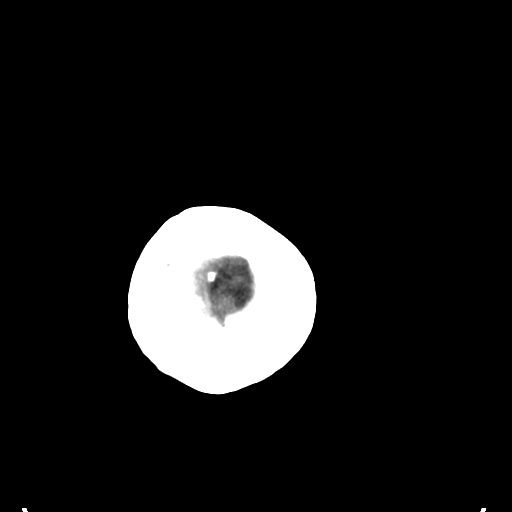

[Series 3: head bone · axial · 0.47mm/px · z∈[-112,-80]mm · 3 of 79 slices shown]
[im 8/79  bone]
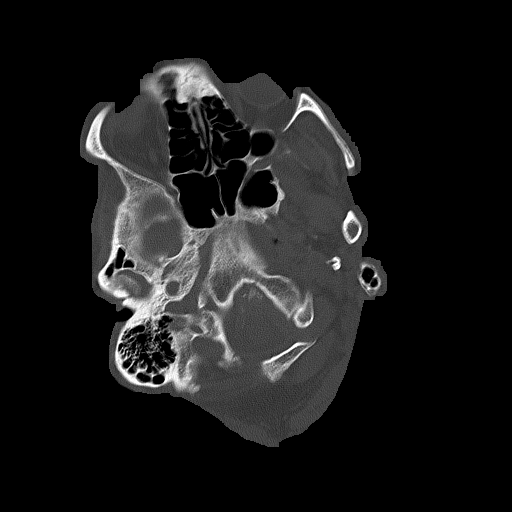
[im 16/79  bone]
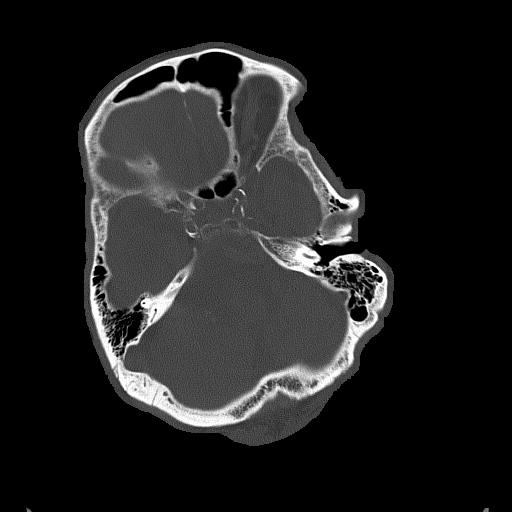
[im 24/79  bone]
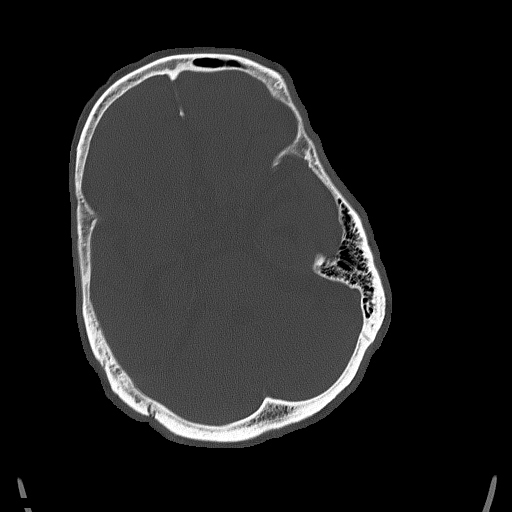

[Series 4: coronal soft tissue · coronal · 0.29mm/px · 3 of 69 slices shown]
[im 23/69  brain]
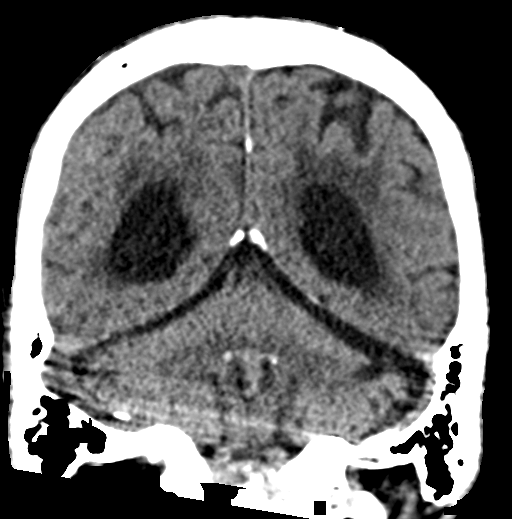
[im 31/69  brain]
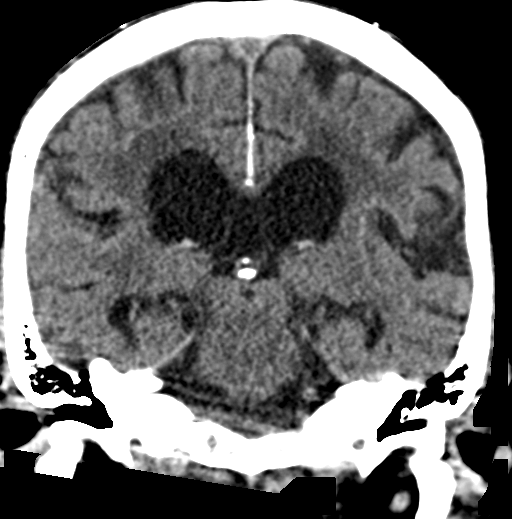
[im 38/69  brain]
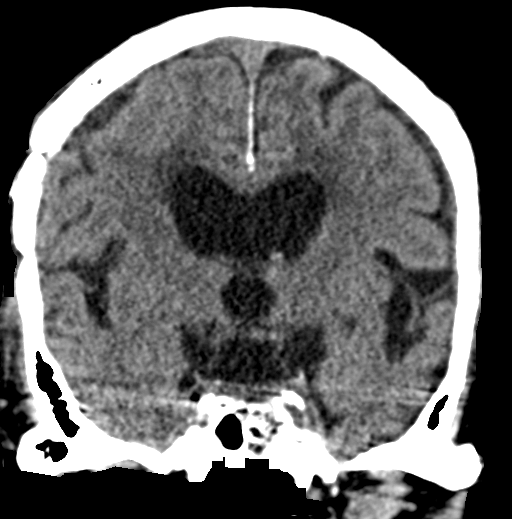

[Series 5: sagittal soft tissue · sagittal · 0.31mm/px · 3 of 50 slices shown]
[im 20/50  brain]
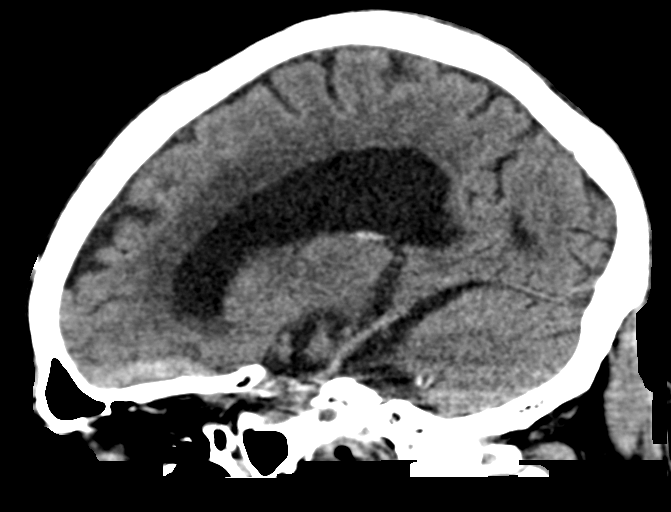
[im 25/50  brain]
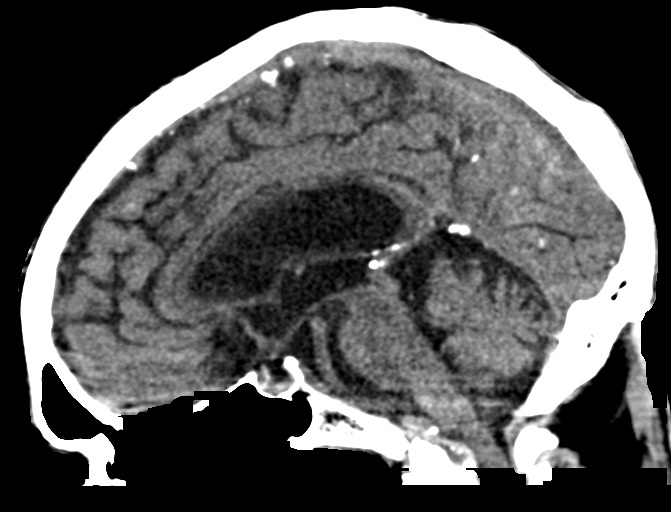
[im 31/50  brain]
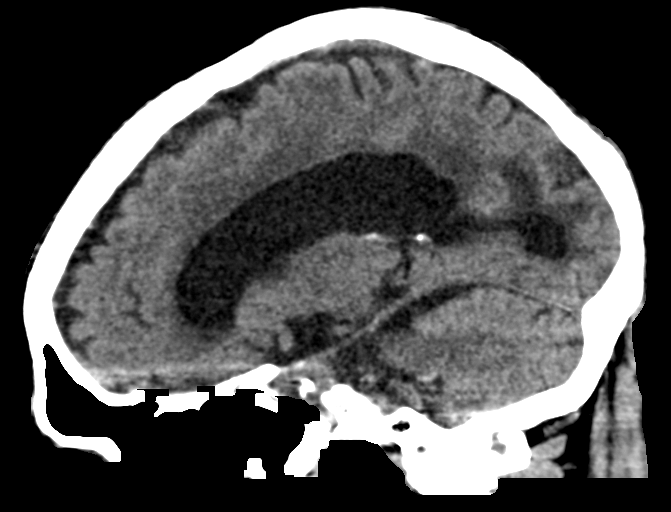

[16 of 47 positions shown; findings below may reference images not displayed]

FINDINGS: Brain: High to isodense subdural hematoma along the anterior and
anterior right frontal convexity which measures up to 6 mm in
thickness. No significant mass effect in the setting of brain
atrophy. Brain atrophy is marked and significantly progressed from
prior. Chronic asymmetric ischemia in the left occipital to parietal
lobe. Confluent chronic small vessel ischemic gliosis in the deep
white matter. No obstructive hydrocephalus or masslike finding.

Vascular: No hyperdense vessel.  Atheromatous calcifications.

Skull: Prior right-sided craniotomy, present on prior. There is
severe C1-2 degeneration with subluxation and erosions, appearance
similar to a [DATE] cervical spine CT.

Sinuses/Orbits: No acute finding

Critical Value/emergent results were called by telephone at the time
of interpretation on [DATE] at [DATE] to provider OM BASEL
, who verbally acknowledged these results.
IMPRESSION: 1. Acute or subacute subdural hematoma along the right frontal
convexity measuring up to 6 mm in thickness. No significant mass
effect.
2. Advanced brain atrophy and chronic small vessel ischemia with
significant progression from [0V].

## 2021-07-10 MED ORDER — LORAZEPAM 1 MG PO TABS: 1.0000 mg | ORAL_TABLET | ORAL | Status: AC | PRN

## 2021-07-10 MED ORDER — FOLIC ACID 1 MG PO TABS: 1.0000 mg | ORAL_TABLET | Freq: Every day | ORAL | Status: DC

## 2021-07-10 MED ORDER — LEVETIRACETAM IN NACL 1000 MG/100ML IV SOLN
1000.0000 mg | Freq: Once | INTRAVENOUS | Status: AC
  Administered 2021-07-10: 1000 mg via INTRAVENOUS
  Filled 2021-07-10 (×2): qty 100

## 2021-07-10 MED ORDER — ADULT MULTIVITAMIN W/MINERALS CH
1.0000 | ORAL_TABLET | Freq: Every day | ORAL | Status: DC
  Administered 2021-07-14: 1 via ORAL
  Filled 2021-07-10: qty 1

## 2021-07-10 MED ORDER — LEVETIRACETAM IN NACL 1000 MG/100ML IV SOLN
1000.0000 mg | Freq: Once | INTRAVENOUS | Status: AC
  Administered 2021-07-10: 1000 mg via INTRAVENOUS
  Filled 2021-07-10: qty 100

## 2021-07-10 MED ORDER — SODIUM CHLORIDE 0.9 % IV SOLN
250.0000 mg | Freq: Two times a day (BID) | INTRAVENOUS | Status: DC
  Administered 2021-07-10 – 2021-07-14 (×8): 250 mg via INTRAVENOUS
  Filled 2021-07-10 (×12): qty 2.5

## 2021-07-10 MED ORDER — LORAZEPAM 2 MG/ML IJ SOLN: 2.0000 mg | Freq: Once | INTRAMUSCULAR | Status: DC

## 2021-07-10 MED ORDER — LORAZEPAM 2 MG/ML IJ SOLN
1.0000 mg | INTRAMUSCULAR | Status: AC | PRN
  Administered 2021-07-10 – 2021-07-13 (×2): 2 mg via INTRAVENOUS
  Filled 2021-07-10 (×2): qty 1

## 2021-07-10 MED ORDER — CHLORHEXIDINE GLUCONATE CLOTH 2 % EX PADS
6.0000 | MEDICATED_PAD | Freq: Every day | CUTANEOUS | Status: DC
  Administered 2021-07-10 – 2021-07-25 (×17): 6 via TOPICAL

## 2021-07-10 MED ORDER — THIAMINE HCL 100 MG PO TABS: 100.0000 mg | ORAL_TABLET | Freq: Every day | ORAL | Status: DC

## 2021-07-10 MED ORDER — THIAMINE HCL 100 MG/ML IJ SOLN
100.0000 mg | Freq: Every day | INTRAMUSCULAR | Status: DC
  Administered 2021-07-10: 100 mg via INTRAVENOUS
  Filled 2021-07-10: qty 2

## 2021-07-10 NOTE — Care Management Important Message (Signed)
Important Message  Patient Details IM Letter placed in Patients room. Name: Gary Stevenson MRN: [id number] Date of Birth: 07-Oct-1940   Medicare Important Message Given:  Yes     Kerin Salen 07/10/2021, 11:53 AM

## 2021-07-10 NOTE — Progress Notes (Signed)
EEG complete - results pending 

## 2021-07-10 NOTE — Progress Notes (Signed)
° ° °  Subjective:  Patient lying in bed responsive only to painful stimuli. Patient lethargic and not alert overnight. Nursing reported possible seizure activity at approximately 5:30 AM.  Objective:   VITALS:   Vitals:   07/10/21 0310 07/10/21 0535 07/10/21 0917 07/10/21 0919  BP: (!) 103/56 122/66  134/79  Pulse: 88   97  Resp:    14  Temp: 97.9 F (36.6 C)   98 F (36.7 C)  TempSrc: Oral   Oral  SpO2: 100%  100% 97%  Weight:      Height:        NAD Neurovascular intact Intact pulses distally Incision: scant drainage   Lab Results  Component Value Date   WBC 11.6 (H) 07/10/2021   HGB 7.2 (L) 07/10/2021   HCT 21.8 (L) 07/10/2021   MCV 86.9 07/10/2021   PLT 313 07/10/2021   BMET    Component Value Date/Time   NA 137 07/10/2021 0325   K 5.4 (H) 07/10/2021 0325   CL 105 07/10/2021 0325   CO2 18 (L) 07/10/2021 0325   GLUCOSE 134 (H) 07/10/2021 0325   BUN 73 (H) 07/10/2021 0325   CREATININE 1.65 (H) 07/10/2021 0325   CALCIUM 7.6 (L) 07/10/2021 0325   GFRNONAA 42 (L) 07/10/2021 0325     Assessment/Plan: 1 Day Post-Op   Principal Problem:   Closed displaced fracture of right femoral neck (HCC) Active Problems:   Acute on chronic blood loss anemia   Displaced fracture of right femoral neck (HCC)   Protein-calorie malnutrition, severe   WBAT with walker DVT ppx: Aspirin, on hold at recommendation of neurology at this time, SCDs, TEDS PO pain control PT/OT HgB 7.2. Plan transfusion if it drops below 7 Creatinine 1.65: Appears in line with other lab draws over the past two months. Continue to monitor Seizure-like activity: Neurology and hospitalist service consulted. Appreciate neurology input Dispo: Pending. Probable rehab     Dorothyann Peng 07/10/2021, 10:40 AM   [facility] is now [facility] [employer] [address] [place], [place], [place] [address] Phone: [phone] [email or web address]   [employer]

## 2021-07-10 NOTE — Progress Notes (Signed)
MD notified due to patient having seizure like activity. Patients entire body began to tremor at 0535 oxygen saturation dropped down to 67%, HR went up to 119. Seizure like activity lasted less than a minute. Vital signs were obtained as followed 122/66 HR 98 O2 100. Amie Critchley, [place] said he would notify Dr. Lyla Glassing to get a hospitalist  on board. Will continue to monitor.

## 2021-07-10 NOTE — Procedures (Addendum)
Patient Name: Gary Stevenson  MRN: [id number]  Epilepsy Attending: Lora Havens  Referring Physician/Provider: Dr Amie [place] Duration: 07/10/2021 2018 to 07/11/2021 0730   Patient history: 81yo m with right SDH, now with seizure like activity. EEG to evaluate for seizure   Level of alertness: lethargic    AEDs during EEG study: LEV, phenobarb   Technical aspects: This EEG study was done with scalp electrodes positioned according to the 10-20 International system of electrode placement. Electrical activity was acquired at a sampling rate of 500Hz  and reviewed with a high frequency filter of 70Hz  and a low frequency filter of 1Hz . EEG data were recorded continuously and digitally stored.    Description: No posterior dominant rhythm was seen. EEG showed continuous low amplitude 3 to 6 Hz theta-delta slowing In left hemisphere.    EEG also showed sharp waves in right frontal region which appeared quasi periodic at 1hz  and rhythmic admixed with 3-5hz  theta-delta slowing with evolution in morphology and frequency. 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 Phenobarbital load was give at around 0200 on 07/11/2021 after which eeg improved. EEG then showed polymorphic mixed frequencies in right hemisphere with predominantly 3-6hz  theta-delta slowing admixed with 15-18Hz  beta activity. Abundant sharp waves were also noted in right hemisphere, maximal right frontal region which were qasi periodic at 0.5-1hz .    Hyperventilation and photic stimulation were not performed.      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 Phenobarbital load was give at  around 0200 on 07/11/2021 after which eeg improved and showed evidence of epileptogenicity and cortical dysfunction in right hemisphere, maximal right frontal region with high potential for seizure recurrence. There was also moderate diffuse encephalopathy, nonspecific etiology but likely due to seizure.   Dr Rory Percy was notified.   Desirre Eickhoff Barbra Sarks

## 2021-07-10 NOTE — Consult Note (Addendum)
Neurology Consultation  Reason for Consult: Seizure postoperatively s/p right hip surgery today  CC: n/a, encephalopathic   History is obtained from:daughter, Gary Stevenson, and nurse, Gary Stevenson   HPI:  Gary Stevenson is an 81 y.o. male with a history of prior tobacco use, brain aneurysm, right frontal SDH s/p surgical evacuation in 2012, lung cancer s/p RML lobectomy 01/2009, ETOH abuse, and CKD who is admitted s/p fall 2 weeks ago for resultant right femoral neck fracutre and right acetabular fracture.   Neurology is consulted for post-operative concern for seizure. A few hours after the procedure, the patient was seen to have high amplitude right upper extremity "shaking" bilaterally that lasted approximately 1 minute in duration as witnessed by nursing. The onset was not witnessed. He has been encephalopathic since after surgery, but was noted to be alert and oriented prior to procedure.   Unable to elicit history from this gentleman due to alteration in mental status.   Spoke with Gary Stevenson, patient's daughter, for history. At baseline, the patient lives alone, but has suffered multiple falls in the past several months (recent C2 fracture, lumbar compression fracture prior to this acetabular fracture). Gary Stevenson helps with finances, but patient takes care of other ADLs on his own. He ambulates, speaks clearly, is [employer] 2 (sometimes does not know year) and has increasing "forgetfulness" over the past several months.   Of note, ? "Seizure" history in past, at the time SDH was ascertained. Length noted as 10 minutes with that event.    Premorbid modified Rankin scale (mRS):  2-Slight disability-UNABLE to perform all activities but does not need assistance   ROS: Unable to obtain due to altered mental status.   Past Medical History:  Past Medical History:  Diagnosis Date   Arthritis    Brain aneurysm    ETOH abuse    intoxicated   Lung cancer ([place])     No family history on file. History  reviewed. No pertinent family history.  Allergies:  No Known Allergies  Social History:   reports that he has quit smoking. He has never used smokeless tobacco. He reports current alcohol use. He reports that he does not use drugs.    Medications Medications Prior to Admission  Medication Sig Dispense Refill   HYDROcodone-acetaminophen (NORCO/VICODIN) 5-325 MG tablet Take 1 tablet by mouth every 6 (six) hours as needed for moderate pain. 14 tablet 0   naproxen sodium (ALEVE) 220 MG tablet Take 220 mg by mouth daily as needed (pain).     indomethacin (INDOCIN) 50 MG capsule Take 1 capsule (50 mg total) by mouth 2 (two) times daily as needed. (Patient not taking: Reported on 07/03/2021) 10 capsule 0   meloxicam (MOBIC) 15 MG tablet Take 1 tablet (15 mg total) by mouth daily. (Patient not taking: Reported on 07/03/2021) 30 tablet 2    Current vital signs: Vitals with BMI 07/10/2021 07/10/2021 07/10/2021  Height - - -  Weight - - -  BMI - - -  Systolic 175 102 585  Diastolic 66 56 58  Pulse - 88 92    Examination:  GENERAL: lethargic, moaning in pain . Cachectic  HEENT: - Normocephalic and atraumatic, dry mm, no LN++, no Thyromegally LUNGS - Clear to auscultation bilaterally with no wheezes CV - S1S2 RRR, no m/r/g, equal pulses bilaterally. ABDOMEN - Soft, nontender, nondistended with normoactive BS Ext: warm, well perfused, intact peripheral pulses, no edema  NEURO:  General/language: Encephalopathic, aphasic. No utilizable speech.Moans, otherwise no vocal output. Not following  any commands. Reaches for chest after a significant delay following initiation of sternal rub, but does not localize to examiner's hand.  Cranial Nerves: eyes PEARRL with suspected mild miosis 2-->23mm, R>L cataracts , reactivity is not brisk  EOM: Left gaze pref , can get to midline but no further towards right Does not blink to threat x 4 quadrants  No facial asymmetry Motor:Subtle left facial twitching  nasolabial as well as right > left UE;  Bilateral myokymia; subtle R>LUE  action myoclonus, no asterixis . No leg involvement.  Pushes examiner away with 5/5 delt/elbow flex/ext, unable to test grip strength. Tone appears to have an element of paratonia  DTRs: 2+ and symmetrical throughout Sensation- able to push examiner away when touched on either UE and LLE, right lower extremity will move to pain Coordination: unable to test, no overt ataxia  Gait- Unable to assess  Labs I have reviewed labs in epic and the results pertinent to this consultation are: Glucose 85-160 Wbc elevated 7.7-->11.6 Calcium low 8.5-->  7.6, no ical available  K now 5.4 from prior value of 5   Imaging I have reviewed the images obtained:, as below    CT-head: acute on chronic right frontal SDH , atrophy with ex-vacuo dilatation on author's read   EEG : Pending  Assessment: [name] is an 81 year old African American gentleman with history as noted in HPI presenting to the hospital after multiple falls sustaining R acetabular and femoral fracture, for surgical fixation, which was performed by ortho on 1/13. Neurology iwas consulted for seizure activity following surgery this AM.  - Description of event is not definitively indicative of GTC seizure, but he does have risk factors of brain atrophy, SDH, and prior evacuation of SDH as well as multiple recent falls over past several months. Also with questionable history of one 10-minute long seizure in the past.  - Exam reveals sustained left gaze preference and right > left upper extremity twitching, subtle twitching of left nasolabial fold.  - DDx includes new ischemic infarction triggering seizures, versus post-anesthesia seizures. There may also be a more systemic contribution for his presentation, with myokymia and more generalized myoclonus seen on exam.    Recommendations: MRI brain  STAT EEG for characterization of abnormal movements   Loaded 1 g  Keppra Started maintenance renally dosed Keppra 250 mg BID Hold ASA and NSAIDS in setting of acute on chronic SDH  in favor of IV APAP Would avoid PO and consider escalation of care Replete calcium B12, folate, TSH, T4 ordered   Lennie Hummer, PA-C Neurology Department   I have seen and examined the patient. I have discussed the assessment and plan with the Neurology PA and have made amendations as needed. 81 year old male with postoperative seizure. Exam reveals left facial twitching, left gaze preference, and right > left upper extremity twitching worrisome for intermittent seizure activity.   Electronically signed: Dr. Kerney Elbe  Addendum: - Preliminary review of EEG reveals right sided rhythmic activity which is sharply contoured.  - Dr. Hortense Ramal has given verbal order for Ativan 2mg  once.  - Additional load of Keppra 1000 mg has been ordered.  - Will need to be transferred to [facility] for LTM. May need addition of another anticonvulsant.  - Has a history of EtOH abuse and should be placed on CIWA protocol (ordered)  Addendum: Official EEG report now available: Abnormalities consist of lateralized rhythmic delta activity within the right cerebral hemisphere ((LRDA) on a background  of continuous generalized slowing. The findings are suggestive of cortical dysfunction arising from right hemisphere likely secondary to known underlying SDH. The rhythmic delta activity in right hemisphere (LRDA) is on the ictal-interictal continuum with low to intermediate potential for seizure. There is also moderate diffuse encephalopathy, nonspecific etiology. No definite seizures or epileptiform discharges were seen throughout the recording.   Electronically signed: Dr. Kerney Elbe

## 2021-07-10 NOTE — Progress Notes (Addendum)
Rapid Response called to assess patients status. Rapid response nurse called on call MD [place], [place]. She reported that patient is very lethargic, he only response to pain, and will only open his eye for a second and then rolls them to the back of his head. Patient is also noted to have very little output even with current maintaince   fluids of normal saline at 131ml/hr running. MD feels like patient is very lethargic due to anesthesia and did not want [place] to Narcan patient at this time. MD ordered to continue fluids as order.  Patients vitals are stable. Will continue to monitor.

## 2021-07-10 NOTE — Consult Note (Signed)
[facility] Medical Consultation  Gary Stevenson:[id number] DOB: 1940-07-12 DOA: 15-Jul-2021 PCP: Renford Dills, MD   Requesting physician: Samson Frederic  Date of consultation: 07/10/2021   Reason for consultation: Altered mental status, lethargy  Impression/Recommendations Principal Problem:   Closed displaced fracture of right femoral neck (HCC) Active Problems:   Acute on chronic blood loss anemia   Displaced fracture of right femoral neck (HCC)   Protein-calorie malnutrition, severe  Toxic metabolic encephalopathy/,Lethargy likely postictal from seizure activity this morning.  Neurology has been consulted as well.  Patient was on Keppra in the past.  We will continue to monitor.  Aspiration precautions.  Neurology recommending MRI of the brain and EEG.  Patient was given 1 g of Keppra for IV loading and will be continued on Keppra.  TSH of 2.9, folate of 11.1, vitamin B12 331.  T4 pending.  WBC of 7.7 from yesterday.  Check CBC in AM.  History of acute on chronic subdural hematoma.  Patient does have history of subdural hematoma status postevacuation.  Hold aspirin and NSAIDs.  Close displaced fracture of the right femoral neck.  Status post surgical intervention.  Weightbearing as tolerated.  Aspirin for DVT prophylaxis.  History of recurrent falls in the past.  Will likely need skilled nursing facility placement  Acute on chronic Blood loss anemia.  Hemoglobin of 7.2 today.  Transfuse for hemoglobin less than 7.  Severe protein calorie malnutrition.  Continue nutritional supplements.  Borderline hyperkalemia.  Latest potassium of 5.4.  We will continue to monitor  Elevated creatinine.  Creatinine of 1.6 today.  Creatinine at 1.3 on 07/12/2021.  On gentle IV fluids.  Continue to monitor  Due to lethargy seizures and need for closer monitoring we will transfer the patient to stepdown unit.  Spoke with the patient's family at bedside.  TRH team will followup again  tomorrow. Please contact me if I can be of assistance in the meanwhile. Thank you for this consultation.  Chief Complaint: Lethargy  HPI:  Patient is 81 years old male with past medical history of right frontal subdural hematomas status post surgical evacuation in 2012, lung cancer status post lobectomy, alcohol abuse, CKD presented to hospital after sustaining a right femoral neck fracture and acetabular fracture.  Patient underwent right hip total arthroplasty on 06/29/2021.  Today patient was found to be responsive only to painful stimulus and was lethargic and was noted to have seizure-like activity around 5:30 AM. Medical team was consulted for lethargy and concerns for seizure that lasted for around 1 minute.  Currently patient is unable to provide much information.  Patient's family at bedside.  Review of Systems:  Limited due to patient's lethargy.  Past Medical History:  Diagnosis Date   Arthritis    Brain aneurysm    ETOH abuse    intoxicated   Lung cancer [facility])    Past Surgical History:  Procedure Laterality Date   BRAIN SURGERY     COLONOSCOPY N/A 03/18/2014   Procedure: COLONOSCOPY;  Surgeon: Charna Elizabeth, MD;  Location: [facility];  Service: Endoscopy;  Laterality: N/A;   ESOPHAGOGASTRODUODENOSCOPY N/A 03/18/2014   Procedure: ESOPHAGOGASTRODUODENOSCOPY (EGD);  Surgeon: Charna Elizabeth, MD;  Location: [facility];  Service: Endoscopy;  Laterality: N/A;   LUNG SURGERY     Social History:  reports that he has quit smoking. He has never used smokeless tobacco. He reports current alcohol use. He reports that he does not use drugs.  No Known Allergies History reviewed. No pertinent family  history.  Prior to Admission medications   Medication Sig Start Date End Date Taking? Authorizing Provider  HYDROcodone-acetaminophen (NORCO/VICODIN) 5-325 MG tablet Take 1 tablet by mouth every 6 (six) hours as needed for moderate pain. 05/27/21  Yes Vanetta Mulders, MD  naproxen sodium  (ALEVE) 220 MG tablet Take 220 mg by mouth daily as needed (pain).   Yes [provider]  indomethacin (INDOCIN) 50 MG capsule Take 1 capsule (50 mg total) by mouth 2 (two) times daily as needed. Patient not taking: Reported on 07/03/2021 02/19/21   Particia Nearing, PA-C  meloxicam (MOBIC) 15 MG tablet Take 1 tablet (15 mg total) by mouth daily. Patient not taking: Reported on 07/03/2021 12/17/20   Lenn Sink, DPM    Physical Exam: Blood pressure 134/79, pulse 97, temperature 98 F (36.7 C), temperature source Oral, resp. rate 14, height 5\' 5"  (1.651 m), weight 45.5 kg, SpO2 97 %. Vitals:   07/10/21 0917 07/10/21 0919  BP:  134/79  Pulse:  97  Resp:  14  Temp:  98 F (36.7 C)  SpO2: 100% 97%   Body mass index is 16.69 kg/m.   Intake/Output Summary (Last 24 hours) at 07/10/2021 1112 Last data filed at 07/10/2021 1012 Gross per 24 hour  Intake 3975 ml  Output 1200 ml  Net 2775 ml    General:not in obvious distress lethargic, confused disoriented HENT: Normocephalic, pupils equally reacting to light and accommodation.  No scleral pallor or icterus noted. Oral mucosa is moist.  Chest:   Diminished breath sounds bilaterally. No crackles or wheezes.  CVS: S1 &S2 heard. No murmur.  Regular rate and rhythm. Abdomen: Soft, nontender, nondistended.  Bowel sounds are heard.   Extremities: No cyanosis, clubbing or edema.  Peripheral pulses are palpable. Psych: Lethargic, confused disoriented, CNS: Moves all extremities.  Right hip surgical site healthy Skin: Warm and dry.  No rashes noted.  Laboratory Data:  CBC: Recent Labs  Lab 07/01/2021 0833 07/16/2021 0658 07/10/21 0325  WBC 8.1 7.7 11.6*  HGB 7.5* 10.8* 7.2*  HCT 23.9* 32.3* 21.8*  MCV 91.2 84.6 86.9  PLT 562* 505* 313    Basic Metabolic Panel: Recent Labs  Lab 07/02/2021 0833 06/28/2021 0658 07/10/21 0325  NA 135 135 137  K 4.9 5.0 5.4*  CL 106 105 105  CO2 20* 22 18*  GLUCOSE 111* 84 134*  BUN 99* 81*  73*  CREATININE 1.46* 1.32* 1.65*  CALCIUM 8.9 8.5* 7.6*  MG  --   --  1.9    Liver Function Tests: Recent Labs  Lab 07/04/2021 0833 07/01/2021 0658  AST 35 30  ALT 24 19  ALKPHOS 172* 167*  BILITOT 0.3 0.8  PROT 7.8 7.7  ALBUMIN 2.6* 2.3*   No results for input(s): LIPASE, AMYLASE in the last 168 hours. No results for input(s): AMMONIA in the last 168 hours.  Cardiac Enzymes: No results for input(s): CKTOTAL, CKMB, CKMBINDEX, TROPONINI in the last 168 hours.  BNP: Invalid input(s): POCBNP  CBG: Recent Labs  Lab 07/12/2021 1706 07/06/2021 2213 07/10/21 0308  GLUCAP 85 160* 135*    Radiological Exams on Admission: CT HEAD WO CONTRAST ( )  Result Date: 07/10/2021 CLINICAL DATA:  Seizure, new onset. EXAM: CT HEAD WITHOUT CONTRAST TECHNIQUE: Contiguous axial images were obtained from the base of the skull through the vertex without intravenous contrast. RADIATION DOSE REDUCTION: This exam was performed according to the departmental dose-optimization program which includes automated exposure control, adjustment of the mA and/or  kV according to patient size and/or use of iterative reconstruction technique. COMPARISON:  11/14/2005 FINDINGS: Brain: High to isodense subdural hematoma along the anterior and anterior right frontal convexity which measures up to 6 mm in thickness. No significant mass effect in the setting of brain atrophy. Brain atrophy is marked and significantly progressed from prior. Chronic asymmetric ischemia in the left occipital to parietal lobe. Confluent chronic small vessel ischemic gliosis in the deep white matter. No obstructive hydrocephalus or masslike finding. Vascular: No hyperdense vessel.  Atheromatous calcifications. Skull: Prior right-sided craniotomy, present on prior. There is severe C1-2 degeneration with subluxation and erosions, appearance similar to a November 2022 cervical spine CT. Sinuses/Orbits: No acute finding Critical Value/emergent results were  called by telephone at the time of interpretation on 07/10/2021 at 6:57 am to provider [facility] , who verbally acknowledged these results. IMPRESSION: 1. Acute or subacute subdural hematoma along the right frontal convexity measuring up to 6 mm in thickness. No significant mass effect. 2. Advanced brain atrophy and chronic small vessel ischemia with significant progression from 2007. Electronically Signed   By: Tiburcio Pea M.D.   On: 07/10/2021 06:57   DG Pelvis Portable  Result Date: 07/14/2021 CLINICAL DATA:  Status post total right hip arthroplasty EXAM: PORTABLE PELVIS 1-2 VIEWS COMPARISON:  07/24/2021 intraoperative fluoroscopy, 07/05/2021 CT right hip FINDINGS: Interval total right hip arthroplasty. Expected postoperative changes including surgical skin staples, soft tissue swelling, and subcutaneous air. No perihardware lucency is seen to indicate hardware failure or loosening. Vascular calcifications are noted. Mild left femoroacetabular joint space narrowing. Diffuse decreased bone mineralization. IMPRESSION: Interval total right hip arthroplasty without evidence of hardware failure. Electronically Signed   By: Neita Garnet   On: 07/01/2021 18:48   CT HIP RIGHT WO CONTRAST  Result Date: 07/19/2021 CLINICAL DATA:  Right hip fracture, surgical planning EXAM: CT OF THE RIGHT HIP WITHOUT CONTRAST TECHNIQUE: Multidetector CT imaging of the right hip was performed according to the standard protocol. Multiplanar CT image reconstructions were also generated. RADIATION DOSE REDUCTION: This exam was performed according to the departmental dose-optimization program which includes automated exposure control, adjustment of the mA and/or kV according to patient size and/or use of iterative reconstruction technique. COMPARISON:  05/27/2021 FINDINGS: Bones/Joint/Cartilage Anterior column fracture of the right acetabulum noted with segmental involvement of the anterior wall and vertical component extending  cephalad in the right iliac bone extending towards the anterior portion of the right SI joint, and with transverse fracture of the inferior pubic ramus. Equivocal posterior column involvement with scalloping the posterior acetabular margin and bony demineralization with some subtle areas of cortical irregularity. There is some signs of interval healing response with demineralization along the fracture planes and some mild osteoid deposition along the anterior column fracture planes. Acetabular demineralization with potential comminuted small fracture fragments along the acetabular roof for example on image 33 series 7. Right femoral neck fracture is observed with varus and apex anterior angulation, as well as resorption of much of the right femoral neck and a substantial portion of the right femoral head especially superiorly. This results in a moderate gap between the remaining femoral head fragment in the inter trochanteric region, with pseudoarticulation of a small extension of the femoral neck with the inter trochanteric region shown on image 36 series 7. Substantial increase in osteoid deposition within the distended joint capsule. Substantial osteoid deposition in the right hip adductor musculature potentially a harbinger of heterotopic ossification. Ligaments Suboptimally assessed by CT. Muscles and Tendons  Substantial osteoid deposition and probably early heterotopic calcification in the right hip adductor musculature. Soft tissues Dense atherosclerotic calcifications. Suspected right posterior bladder diverticulum. IMPRESSION: 1. Comminuted anterior column fracture of the right acetabulum, likely with involvement of the acetabular roof and with questionable posterior column involvement. Substantial erosion along the acetabulum with increased deposition of osteoid in the distended joint. 2. Femoral neck fracture with resorption of most of the femoral neck and a portion of the femoral head. 3. Osteoid  deposition of possibly early heterotopic calcification in the right hip adductor musculature. 4. Extensive atherosclerosis. Electronically Signed   By: Gaylyn Rong M.D.   On: 07/05/2021 07:59   DG C-Arm 1-60 Min-No Report  Result Date: 07/14/2021 Fluoroscopy was utilized by the requesting physician.  No radiographic interpretation.   DG C-Arm 1-60 Min-No Report  Result Date: 07/11/2021 Fluoroscopy was utilized by the requesting physician.  No radiographic interpretation.   DG HIP UNILAT WITH PELVIS 1V RIGHT  Result Date: 07/21/2021 CLINICAL DATA:  Right total hip replacement EXAM: DG HIP (WITH OR WITHOUT PELVIS) 1V RIGHT COMPARISON:  None. FINDINGS: Intraoperative images during right total hip arthroplasty. Normal alignment without evidence of loosening or new fracture on the final image. IMPRESSION: Intraoperative images during right total hip arthroplasty. Normal alignment without evidence of immediate hardware complication. Electronically Signed   By: Caprice Renshaw M.D.   On: 07/12/2021 16:22    EKG: Independently reviewed.  EKG showed normal sinus rhythm.  Time spent: 50 minutes.  Damiah Mcdonald [facility] 07/10/2021

## 2021-07-10 NOTE — Procedures (Signed)
Patient Name: Gary Stevenson  MRN: [id number]  Epilepsy Attending: Lora Havens  Referring Physician/Provider: Lennie Hummer, PA-C Date: 07/10/2021 Duration: 29.11 mins  Patient history: 81yo m with right SDH, now with seizure like activity. EEG to evaluate for seizure  Level of alertness: lethargic   AEDs during EEG study: LEV  Technical aspects: This EEG study was done with scalp electrodes positioned according to the 10-20 International system of electrode placement. Electrical activity was acquired at a sampling rate of 500Hz  and reviewed with a high frequency filter of 70Hz  and a low frequency filter of 1Hz . EEG data were recorded continuously and digitally stored.   Description: No posterior dominant rhythm was seen. EEG showed continuous low amplitude 3 to 6 Hz theta-delta slowing In left hemisphere.   EEG also showed 3-5hz  theta-delta slowing in right hemisphere which is sharply contoured and rhythmic. There  is also intermittent 15-16hz  beta activity in right hemisphere.   Hyperventilation and photic stimulation were not performed.     ABNORMALITY - Lateralized rhythmic delta activity, right hemisphere ((LRDA) - Continuous slow, generalized   IMPRESSION: This study is suggestive of cortical dysfunction arising from right hemisphere likely secondary to underlying SDH. The rhythmic delta activity in right hemisphere ( LRDA) is on the ictal-interictal continuum with low to intermediate potential for seizure.   There is also moderate diffuse encephalopathy, nonspecific etiology. No definite seizures or epileptiform discharges were seen throughout the recording.  If suspicion for interictal activity remains a concern, a prolonged study should be considered.   Maanvi Lecompte Barbra Sarks

## 2021-07-10 NOTE — Progress Notes (Signed)
[id number] Conversations with Lennie Hummer, Neuro PA; Dr. Lyla Glassing, and Dr. Cheral Marker regarding patient's status. He is too lethargic to safely take PO medications. Clarified that PO are medications are to be held. Clarified that Ketorolac and Dexamethasone are not indicated at this time. Order obtained to discontinue Dexamethasone.   MRI department advises that patient will need to lie still for 35 minutes to complete the ordered MRI. Medical team made aware that patient is unable to follow commands to complete this sort of procedure.  Patient monitored throughout this time frame. Oxygen saturations remain high 90's-100%. 3W not a tele floor and request made that another unit be considered.  Ivan Anchors, RN 07/10/21 10:51 AM

## 2021-07-10 NOTE — Plan of Care (Signed)
°  Problem: Education: Goal: Knowledge of General Education information will improve Description: Including pain rating scale, medication(s)/side effects and non-pharmacologic comfort measures Outcome: Not Progressing   Problem: Health Behavior/Discharge Planning: Goal: Ability to manage health-related needs will improve Outcome: Not Progressing   Problem: Clinical Measurements: Goal: Ability to maintain clinical measurements within normal limits will improve Outcome: Not Progressing   Patient too lethargic to verbalize/demonstrate understanding of education. Lennie Hummer, Neurology PA at bedside updated regarding patient's history, current status, and interventions. Updated that Ketorolac was held due to patient condition. She is in agreement with this. MRI is ordered. Will continue to monitor patient.  Ivan Anchors, RN 07/10/21 8:30 AM

## 2021-07-10 NOTE — Progress Notes (Signed)
CT head is done. Neurology called.

## 2021-07-10 NOTE — Progress Notes (Signed)
LTM EEG hooked up and running - no initial skin breakdown - push button tested - neuro notified. [facility] monitoring. [facility] monitored, Event button test confirmed by [facility].

## 2021-07-10 NOTE — Progress Notes (Signed)
PT Cancellation Note  Patient Details Name: WELTON BORD MRN: [id number] DOB: [birth year]   Cancelled Treatment:    Reason Eval/Treat Not Completed: Medical issues which prohibited therapy ([facility] called this AM for seizure like activity. Pt to have MRI this AM and neurology consult. Acute PT will hold and follow up at later date/time.)  Gwynneth Albright PT, DPT Acute Rehabilitation Services Office [phone] Pager [phone]

## 2021-07-10 NOTE — Plan of Care (Signed)
Canceling MRI order for now. Needs LTM EEG first. Once completed, can remove leads and get MRI,  -- Amie Portland, MD Neurologist [facility] Pager: [phone]

## 2021-07-11 DIAGNOSIS — L8990 Pressure ulcer of unspecified site, unspecified stage: Secondary | ICD-10-CM

## 2021-07-11 LAB — TYPE AND SCREEN
ABO/RH(D): A POS
ABO/RH(D): A POS
Antibody Screen: NEGATIVE
Antibody Screen: NEGATIVE
Unit division: 0
Unit division: 0
Unit division: 0
Unit division: 0

## 2021-07-11 LAB — CBC
HCT: 16.9 % — ABNORMAL LOW (ref 39.0–52.0)
HCT: 26.2 % — ABNORMAL LOW (ref 39.0–52.0)
Hemoglobin: 5.6 g/dL — CL (ref 13.0–17.0)
Hemoglobin: 8.9 g/dL — ABNORMAL LOW (ref 13.0–17.0)
MCH: 28.6 pg (ref 26.0–34.0)
MCH: 29.8 pg (ref 26.0–34.0)
MCHC: 33.1 g/dL (ref 30.0–36.0)
MCHC: 34 g/dL (ref 30.0–36.0)
MCV: 86.2 fL (ref 80.0–100.0)
MCV: 87.6 fL (ref 80.0–100.0)
Platelets: 298 10*3/uL (ref 150–400)
Platelets: 280 10*3/uL (ref 150–400)
RBC: 1.96 MIL/uL — ABNORMAL LOW (ref 4.22–5.81)
RBC: 2.99 MIL/uL — ABNORMAL LOW (ref 4.22–5.81)
RDW: 15.9 % — ABNORMAL HIGH (ref 11.5–15.5)
RDW: 17 % — ABNORMAL HIGH (ref 11.5–15.5)
WBC: 10.1 10*3/uL (ref 4.0–10.5)
WBC: 11.2 10*3/uL — ABNORMAL HIGH (ref 4.0–10.5)
nRBC: 0 % (ref 0.0–0.2)
nRBC: 0 % (ref 0.0–0.2)

## 2021-07-11 LAB — BASIC METABOLIC PANEL
Anion gap: 10 (ref 5–15)
BUN: 54 mg/dL — ABNORMAL HIGH (ref 8–23)
CO2: 17 mmol/L — ABNORMAL LOW (ref 22–32)
Calcium: 7.1 mg/dL — ABNORMAL LOW (ref 8.9–10.3)
Chloride: 118 mmol/L — ABNORMAL HIGH (ref 98–111)
Creatinine, Ser: 1.59 mg/dL — ABNORMAL HIGH (ref 0.61–1.24)
GFR, Estimated: 44 mL/min — ABNORMAL LOW (ref >60)
Glucose, Bld: 81 mg/dL (ref 70–99)
Potassium: 4.2 mmol/L (ref 3.5–5.1)
Sodium: 145 mmol/L (ref 135–145)

## 2021-07-11 LAB — BPAM RBC
Blood Product Expiration Date: 202301272359
Blood Product Expiration Date: 202301292359
Blood Product Expiration Date: 202301292359
Blood Product Expiration Date: 202301292359
ISSUE DATE / TIME: 202301112311
ISSUE DATE / TIME: 202301120154
ISSUE DATE / TIME: 202301121609
ISSUE DATE / TIME: 202301140905
Unit Type and Rh: 6200
Unit Type and Rh: 6200
Unit Type and Rh: 6200
Unit Type and Rh: 6200

## 2021-07-11 LAB — COMPREHENSIVE METABOLIC PANEL
ALT: 8 U/L (ref 0–44)
AST: 23 U/L (ref 15–41)
Albumin: 1.5 g/dL — ABNORMAL LOW (ref 3.5–5.0)
Alkaline Phosphatase: 97 U/L (ref 38–126)
Anion gap: 9 (ref 5–15)
BUN: 56 mg/dL — ABNORMAL HIGH (ref 8–23)
CO2: 18 mmol/L — ABNORMAL LOW (ref 22–32)
Calcium: 7.2 mg/dL — ABNORMAL LOW (ref 8.9–10.3)
Chloride: 117 mmol/L — ABNORMAL HIGH (ref 98–111)
Creatinine, Ser: 1.64 mg/dL — ABNORMAL HIGH (ref 0.61–1.24)
GFR, Estimated: 42 mL/min — ABNORMAL LOW (ref >60)
Glucose, Bld: 77 mg/dL (ref 70–99)
Potassium: 4.2 mmol/L (ref 3.5–5.1)
Sodium: 144 mmol/L (ref 135–145)
Total Bilirubin: 0.4 mg/dL (ref 0.3–1.2)
Total Protein: 5.1 g/dL — ABNORMAL LOW (ref 6.5–8.1)

## 2021-07-11 LAB — HEPATIC FUNCTION PANEL
ALT: 8 U/L (ref 0–44)
ALT: 8 U/L (ref 0–44)
AST: 23 U/L (ref 15–41)
AST: 22 U/L (ref 15–41)
Albumin: 1.5 g/dL — ABNORMAL LOW (ref 3.5–5.0)
Albumin: 1.5 g/dL — ABNORMAL LOW (ref 3.5–5.0)
Alkaline Phosphatase: 100 U/L (ref 38–126)
Alkaline Phosphatase: 101 U/L (ref 38–126)
Bilirubin, Direct: <0.1 mg/dL (ref 0.0–0.2)
Bilirubin, Direct: <0.1 mg/dL (ref 0.0–0.2)
Total Bilirubin: 0.5 mg/dL (ref 0.3–1.2)
Total Bilirubin: 0.5 mg/dL (ref 0.3–1.2)
Total Protein: 5.1 g/dL — ABNORMAL LOW (ref 6.5–8.1)
Total Protein: 5.2 g/dL — ABNORMAL LOW (ref 6.5–8.1)

## 2021-07-11 LAB — HEMOGLOBIN AND HEMATOCRIT, BLOOD
HCT: 18.3 % — ABNORMAL LOW (ref 39.0–52.0)
Hemoglobin: 6 g/dL — CL (ref 13.0–17.0)

## 2021-07-11 LAB — PHOSPHORUS: Phosphorus: 4 mg/dL (ref 2.5–4.6)

## 2021-07-11 LAB — PREPARE RBC (CROSSMATCH)

## 2021-07-11 LAB — MAGNESIUM: Magnesium: 1.9 mg/dL (ref 1.7–2.4)

## 2021-07-11 MED ORDER — PHENOBARBITAL SODIUM 130 MG/ML IJ SOLN
65.0000 mg | Freq: Two times a day (BID) | INTRAMUSCULAR | Status: DC
  Administered 2021-07-11 – 2021-07-14 (×7): 65 mg via INTRAVENOUS
  Filled 2021-07-11 (×7): qty 1

## 2021-07-11 MED ORDER — SODIUM CHLORIDE 0.9 % IV SOLN
20.0000 mg/kg | Freq: Once | INTRAVENOUS | Status: AC
  Administered 2021-07-11: 910 mg via INTRAVENOUS
  Filled 2021-07-11 (×2): qty 7

## 2021-07-11 MED ORDER — CYANOCOBALAMIN 1000 MCG/ML IJ SOLN
1000.0000 ug | Freq: Every day | INTRAMUSCULAR | Status: AC
  Administered 2021-07-11 – 2021-07-17 (×7): 1000 ug via INTRAMUSCULAR
  Filled 2021-07-11 (×7): qty 1

## 2021-07-11 MED ORDER — THIAMINE HCL 100 MG/ML IJ SOLN
500.0000 mg | INTRAVENOUS | Status: AC
  Administered 2021-07-11 – 2021-07-13 (×3): 500 mg via INTRAVENOUS
  Filled 2021-07-11 (×3): qty 5

## 2021-07-11 MED ORDER — THIAMINE HCL 100 MG/ML IJ SOLN
100.0000 mg | Freq: Every day | INTRAMUSCULAR | Status: DC
  Administered 2021-07-15: 100 mg via INTRAVENOUS
  Filled 2021-07-11: qty 2

## 2021-07-11 MED ORDER — SODIUM CHLORIDE 0.9% IV SOLUTION: Freq: Once | INTRAVENOUS | Status: AC

## 2021-07-11 MED ORDER — PANTOPRAZOLE SODIUM 40 MG IV SOLR
40.0000 mg | Freq: Two times a day (BID) | INTRAVENOUS | Status: DC
  Administered 2021-07-11 – 2021-07-16 (×12): 40 mg via INTRAVENOUS
  Filled 2021-07-11 (×12): qty 40

## 2021-07-11 MED ORDER — THIAMINE HCL 100 MG PO TABS
100.0000 mg | ORAL_TABLET | Freq: Every day | ORAL | Status: DC
  Administered 2021-07-14: 100 mg via ORAL
  Filled 2021-07-11: qty 1

## 2021-07-11 MED ORDER — LIP MEDEX EX OINT
TOPICAL_OINTMENT | CUTANEOUS | Status: DC | PRN
  Administered 2021-07-16 – 2021-07-18 (×3): 75 via TOPICAL
  Administered 2021-07-27: 1 via TOPICAL
  Filled 2021-07-11 (×3): qty 7

## 2021-07-11 NOTE — Progress Notes (Signed)
PT Cancellation Note  Patient Details Name: Gary Stevenson MRN: [id number] DOB: February 04, 1941   Cancelled Treatment:    Reason Eval/Treat Not Completed: Fatigue/lethargy limiting ability to participate  Second follow-up. Spoke with RN again and requests hold formal evaluation until tomorrow. Pt still very lethargic, seeming to arouse to family in room at the moment.  Will follow-up tomorrow for PT evaluation.   Candie Mile, PT, DPT  Ellouise Newer 07/11/2021, 4:25 PM

## 2021-07-11 NOTE — Progress Notes (Signed)
LTM maintain done at bedside. No skin breakdown noted. Results pending.

## 2021-07-11 NOTE — Progress Notes (Signed)
HOSPITAL MEDICINE OVERNIGHT EVENT NOTE    Notified by nursing that they were contacted by lab stating that the patient's hemoglobin is 5.6 this morning.  Patient is not exhibiting any clinical evidence of bleeding.  Patient is not hypotensive, patient is not complaining of any chest pain or shortness of breath.  Per my review of the chart there are no morning labs that have resulted for this patient.  Upon nursing attempted to contact the lab to inquire as to where these results went to lab as stated that these results were canceled (not on the provider end) and is unable to explain further why nothing is resulted.  Considering the patient's substantial drop in hemoglobin with no clinical evidence of bleeding we will reorder morning labs stat as I do not want to order a blood transfusion I do not have a official hemoglobin reported in the chart.  This will also serve as confirmation as to whether or not this drop in hemoglobin is legitimate.  Vernelle Emerald  MD [facility]

## 2021-07-11 NOTE — Progress Notes (Signed)
° °  Subjective: 2 Days Post-Op Procedure(s) (LRB): TOTAL HIP ARTHROPLASTY ANTERIOR APPROACH (Right) Patient is not conversational. He is resting in bed with his daughter at the bedside. She states he was able to tell his name earlier this morning, but for me only moans at painful stimuli (I.e. me removing his dressing).  Patient seen in rounds for Dr. Lyla Glassing.   Objective: Vital signs in last 24 hours: Temp:  [98 F (36.7 C)-98.7 F (37.1 C)] 98.6 F (37 C) (01/14 0944) Pulse Rate:  [75-106] 75 (01/14 0944) Resp:  [14-26] 20 (01/14 0944) BP: (114-163)/(58-81) 114/72 (01/14 0944) SpO2:  [88 %-100 %] 100 % (01/14 0944)  Intake/Output from previous day:  Intake/Output Summary (Last 24 hours) at 07/11/2021 1031 Last data filed at 07/10/2021 1616 Gross per 24 hour  Intake 588.74 ml  Output 175 ml  Net 413.74 ml     Intake/Output this shift: No intake/output data recorded.  Labs: Recent Labs    07/14/2021 0658 07/10/21 0325 07/11/21 0744  HGB 10.8* 7.2* 6.0*   Recent Labs    07/10/2021 0658 07/10/21 0325 07/11/21 0744  WBC 7.7 11.6*  --   RBC 3.82* 2.51*  --   HCT 32.3* 21.8* 18.3*  PLT 505* 313  --    Recent Labs    07/10/21 0325 07/11/21 0744  NA 137 145  K 5.4* 4.2  CL 105 118*  CO2 18* 17*  BUN 73* 54*  CREATININE 1.65* 1.59*  GLUCOSE 134* 81  CALCIUM 7.6* 7.1*   No results for input(s): LABPT, INR in the last 72 hours.  Exam: General - Patient is responsive to painful stimuli with moaning.  Extremity - Neurologically intact Compartment soft Dressing - aquacel dressings were saturated. I removed these. Staples and nylon sutures in place. Incision intact with minimal bloody drainage. Re-dressed with aquacel. No evidence of hematoma formation. Motor Function - unable to fully assess, but moving arms and legs in bed voluntarily.   Past Medical History:  Diagnosis Date   Arthritis    Brain aneurysm    ETOH abuse    intoxicated   Lung cancer ([place])      Assessment/Plan: 2 Days Post-Op Procedure(s) (LRB): TOTAL HIP ARTHROPLASTY ANTERIOR APPROACH (Right) Principal Problem:   Closed displaced fracture of right femoral neck (HCC) Active Problems:   Acute on chronic blood loss anemia   Displaced fracture of right femoral neck (HCC)   Protein-calorie malnutrition, severe   Pressure injury of skin  Estimated body mass index is 16.69 kg/m as calculated from the following:   Height as of this encounter: 5\' 5"  (1.651 m).   Weight as of this encounter: 45.5 kg.   DVT Prophylaxis - ASA held currently. SCDs/TEDS Weight bearing as tolerated. PO pain control PT/OT  Hgb 6.0 this AM. Receiving blood transfusion now.   Likely to have mild bleeding at incision site based on wound closure with staples and nylon. We may change aquacels as needed, but they are very absorbant. Call with any concerns.   Continue care per primary team. Disposition pending, family wishes for SNF placement.     Griffith Citron, PA-C Orthopedic Surgery [phone] 07/11/2021, 10:31 AM

## 2021-07-11 NOTE — Progress Notes (Signed)
LTM maint complete - no skin breakdown [facility] monitored, Event button test confirmed by [facility]. ? ?

## 2021-07-11 NOTE — Progress Notes (Signed)
Notified by Dr. Silverio Decamp seizures emanating from the right hemisphere-likely in the setting of the acute/subacute subdural hematoma. Current antiepileptic regimen-Keppra 250 twice daily renally dosed We will give him a load has a history of alcohol use Will add phenobarbital as second agent-we will load for now and follow the EEG to see if standing doses may be required. As needed benzos for breakthrough seizures clinically  -- Amie Portland, MD Neurologist [facility] Pager: [phone]

## 2021-07-11 NOTE — Progress Notes (Signed)
Subjective: Significantly improved this AM. Now semi-awake with eyes closed and murmuring comprehensible responses to questions and stimuli, but not oriented.   Objective: Current vital signs: BP 115/62    Pulse 81    Temp 98 F (36.7 C) (Oral)    Resp 15    Ht 5\' 5"  (1.651 m)    Wt 45.5 kg    SpO2 100%    BMI 16.69 kg/m  Vital signs in last 24 hours: Temp:  [98 F (36.7 C)-98.7 F (37.1 C)] 98 F (36.7 C) (01/14 0325) Pulse Rate:  [81-106] 81 (01/14 0554) Resp:  [14-26] 15 (01/14 0554) BP: (114-163)/(58-81) 115/62 (01/14 0554) SpO2:  [88 %-100 %] 100 % (01/14 0554)  Intake/Output from previous day: 01/13 0701 - 01/14 0700 In: 1011.1 [P.O.:50; I.V.:861.1; IV Piggyback:100] Out: 475 [Urine:475] Intake/Output this shift: No intake/output data recorded. Nutritional status:  Diet Order             Diet NPO time specified  Diet effective now                  HEENT: EEG leads in place. [place]/AT Lungs: Respirations unlabored Ext: Dressing to right hip is C/D/I  Neurologic Exam: Ment: Significantly improved this AM. Now semi-awake with eyes closed and murmuring comprehensible responses to questions and stimuli, but not oriented.  CN: Pupils are 2 mm bilaterally. In the context of right worse than left corneal opacities, it is difficult to assess for pupillary light reactivity. Face symmetric. Phonation intact. Head is midline.  Motor: Moves all 4 extremities equally, but does not cooperate with strength testing Sensory: Reacts to stimuli in each extremity Reflexes: Unremarkable Cerebellar: Not following commands  Lab Results: Results for orders placed or performed during the hospital encounter of 06/29/2021 (from the past 48 hour(s))  Prepare RBC (crossmatch)     Status: None   Collection Time: 07/06/2021 12:46 PM  Result Value Ref Range   Order Confirmation      ORDER PROCESSED BY BLOOD BANK Performed at [facility] [address], [place], [place]  [address]   Glucose, capillary     Status: None   Collection Time: 07/23/2021  5:06 PM  Result Value Ref Range   Glucose-Capillary 85 70 - 99 mg/dL    Comment: Glucose reference range applies only to samples taken after fasting for at least 8 hours.  Glucose, capillary     Status: Abnormal   Collection Time: 07/25/2021 10:13 PM  Result Value Ref Range   Glucose-Capillary 160 (H) 70 - 99 mg/dL    Comment: Glucose reference range applies only to samples taken after fasting for at least 8 hours.  Glucose, capillary     Status: Abnormal   Collection Time: 07/10/21  3:08 AM  Result Value Ref Range   Glucose-Capillary 135 (H) 70 - 99 mg/dL    Comment: Glucose reference range applies only to samples taken after fasting for at least 8 hours.  CBC     Status: Abnormal   Collection Time: 07/10/21  3:25 AM  Result Value Ref Range   WBC 11.6 (H) 4.0 - 10.5 K/uL   RBC 2.51 (L) 4.22 - 5.81 MIL/uL   Hemoglobin 7.2 (L) 13.0 - 17.0 g/dL    Comment: REPEATED TO VERIFY   HCT 21.8 (L) 39.0 - 52.0 %   MCV 86.9 80.0 - 100.0 fL   MCH 28.7 26.0 - 34.0 pg   MCHC 33.0 30.0 - 36.0 g/dL   RDW  15.8 (H) 11.5 - 15.5 %   Platelets 313 150 - 400 K/uL   nRBC 0.0 0.0 - 0.2 %    Comment: Performed at [facility], [place] [address] [place], [place] [address]  Basic metabolic panel     Status: Abnormal   Collection Time: 07/10/21  3:25 AM  Result Value Ref Range   Sodium 137 135 - 145 mmol/L   Potassium 5.4 (H) 3.5 - 5.1 mmol/L   Chloride 105 98 - 111 mmol/L   CO2 18 (L) 22 - 32 mmol/L   Glucose, Bld 134 (H) 70 - 99 mg/dL    Comment: Glucose reference range applies only to samples taken after fasting for at least 8 hours.   BUN 73 (H) 8 - 23 mg/dL   Creatinine, Ser 1.65 (H) 0.61 - 1.24 mg/dL   Calcium 7.6 (L) 8.9 - 10.3 mg/dL   GFR, Estimated 42 (L) >60 mL/min    Comment: (NOTE) Calculated using the CKD-EPI Creatinine Equation (2021)    Anion gap 14 5 - 15    Comment: Performed at [facility], [place] [address] [place], [place] [address]  Magnesium     Status: None   Collection Time: 07/10/21  3:25 AM  Result Value Ref Range   Magnesium 1.9 1.7 - 2.4 mg/dL    Comment: Performed at [facility], [place] [address] [place], [place] [address]  Type and screen [place]     Status: None   Collection Time: 07/10/21  8:15 PM  Result Value Ref Range   ABO/RH(D) A POS    Antibody Screen NEG    Sample Expiration      07/13/2021,2359 Performed at [facility], [place] [address] [place], [place] [address]   Hepatic function panel     Status: Abnormal   Collection Time: 07/11/21  6:32 AM  Result Value Ref Range   Total Protein 5.1 (L) 6.5 - 8.1 g/dL   Albumin 1.5 (L) 3.5 - 5.0 g/dL   AST 23 15 - 41 U/L   ALT 8 0 - 44 U/L   Alkaline Phosphatase 100 38 - 126 U/L   Total Bilirubin 0.5 0.3 - 1.2 mg/dL   Bilirubin, Direct <0.1 0.0 - 0.2 mg/dL   Indirect Bilirubin NOT CALCULATED 0.3 - 0.9 mg/dL    Comment: Performed at [place] [address] [place], [place] [address]    Recent Results (from the past 240 hour(s))  Surgical pcr screen     Status: Abnormal   Collection Time: 07/15/2021  8:33 AM   Specimen: Nasal Mucosa; Nasal Swab  Result Value Ref Range Status   MRSA, PCR NEGATIVE NEGATIVE Final   Staphylococcus aureus POSITIVE (A) NEGATIVE Final    Comment: (NOTE) The Xpert SA Assay (FDA approved for NASAL specimens in patients 84 years of age and older), is one component of a comprehensive surveillance program. It is not intended to diagnose infection nor to guide or monitor treatment. Performed at [facility], [place] [address] [place], [place] [address]   SARS CORONAVIRUS 2 (TAT 6-24 HRS) Nasopharyngeal Nasopharyngeal Swab     Status: None   Collection Time: 07/20/2021  8:33 AM   Specimen: Nasopharyngeal Swab  Result Value Ref Range Status   SARS Coronavirus 2 NEGATIVE NEGATIVE Final     Comment: (NOTE) SARS-CoV-2 target nucleic acids are NOT DETECTED.  The SARS-CoV-2 RNA is generally detectable in upper and  lower respiratory specimens during the acute phase of infection. Negative results do not preclude SARS-CoV-2 infection, do not rule out co-infections with other pathogens, and should not be used as the sole basis for treatment or other patient management decisions. Negative results must be combined with clinical observations, patient history, and epidemiological information. The expected result is Negative.  Fact Sheet for Patients: SugarRoll.be  Fact Sheet for Healthcare Providers: https://www.woods-mathews.com/  This test is not yet approved or cleared by the [place] FDA and  has been authorized for detection and/or diagnosis of SARS-CoV-2 by FDA under an Emergency Use Authorization (EUA). This EUA will remain  in effect (meaning this test can be used) for the duration of the COVID-19 declaration under Se ction 564(b)(1) of the Act, 21 U.S.C. section 360bbb-3(b)(1), unless the authorization is terminated or revoked sooner.  Performed at [facility], [place] [address] [place], [place] [address]     Lipid Panel No results for input(s): CHOL, TRIG, HDL, CHOLHDL, VLDL, LDLCALC in the last 72 hours.  Studies/Results: CT HEAD WO CONTRAST (5MM)  Result Date: 07/10/2021 CLINICAL DATA:  Seizure, new onset. EXAM: CT HEAD WITHOUT CONTRAST TECHNIQUE: Contiguous axial images were obtained from the base of the skull through the vertex without intravenous contrast. RADIATION DOSE REDUCTION: This exam was performed according to the departmental dose-optimization program which includes automated exposure control, adjustment of the mA and/or kV according to patient size and/or use of iterative reconstruction technique. COMPARISON:  11/14/2005 FINDINGS: Brain: High to isodense subdural hematoma along the anterior and anterior  right frontal convexity which measures up to 6 mm in thickness. No significant mass effect in the setting of brain atrophy. Brain atrophy is marked and significantly progressed from prior. Chronic asymmetric ischemia in the left occipital to parietal lobe. Confluent chronic small vessel ischemic gliosis in the deep white matter. No obstructive hydrocephalus or masslike finding. Vascular: No hyperdense vessel.  Atheromatous calcifications. Skull: Prior right-sided craniotomy, present on prior. There is severe C1-2 degeneration with subluxation and erosions, appearance similar to a November 2022 cervical spine CT. Sinuses/Orbits: No acute finding Critical Value/emergent results were called by telephone at the time of interpretation on 07/10/2021 at 6:57 am to provider [facility] , who verbally acknowledged these results. IMPRESSION: 1. Acute or subacute subdural hematoma along the right frontal convexity measuring up to 6 mm in thickness. No significant mass effect. 2. Advanced brain atrophy and chronic small vessel ischemia with significant progression from 2007. Electronically Signed   By: Jorje Guild M.D.   On: 07/10/2021 06:57   DG Pelvis Portable  Result Date: 07/23/2021 CLINICAL DATA:  Status post total right hip arthroplasty EXAM: PORTABLE PELVIS 1-2 VIEWS COMPARISON:  07/07/2021 intraoperative fluoroscopy, 07/07/2021 CT right hip FINDINGS: Interval total right hip arthroplasty. Expected postoperative changes including surgical skin staples, soft tissue swelling, and subcutaneous air. No perihardware lucency is seen to indicate hardware failure or loosening. Vascular calcifications are noted. Mild left femoroacetabular joint space narrowing. Diffuse decreased bone mineralization. IMPRESSION: Interval total right hip arthroplasty without evidence of hardware failure. Electronically Signed   By: Yvonne Kendall   On: 06/29/2021 18:48   EEG adult  Result Date: 07/10/2021 Lora Havens, MD      07/10/2021  5:25 PM Patient Name: NEIMAN ROOTS MRN: [id number] Epilepsy Attending: Lora Havens Referring Physician/Provider: Lennie Hummer, PA-C Date: 07/10/2021 Duration: 29.11 mins Patient history: 81yo m with right SDH, now with seizure like activity. EEG to evaluate for seizure Level  of alertness: lethargic AEDs during EEG study: LEV Technical aspects: This EEG study was done with scalp electrodes positioned according to the 10-20 International system of electrode placement. Electrical activity was acquired at a sampling rate of 500Hz  and reviewed with a high frequency filter of 70Hz  and a low frequency filter of 1Hz . EEG data were recorded continuously and digitally stored. Description: No posterior dominant rhythm was seen. EEG showed continuous low amplitude 3 to 6 Hz theta-delta slowing In left hemisphere. EEG also showed 3-5hz  theta-delta slowing in right hemisphere which is sharply contoured and rhythmic. There  is also intermittent 15-16hz  beta activity in right hemisphere. Hyperventilation and photic stimulation were not performed.   ABNORMALITY - Lateralized rhythmic delta activity, right hemisphere ((LRDA) - Continuous slow, generalized IMPRESSION: This study is suggestive of cortical dysfunction arising from right hemisphere likely secondary to underlying SDH. The rhythmic delta activity in right hemisphere ( LRDA) is on the ictal-interictal continuum with low to intermediate potential for seizure. There is also moderate diffuse encephalopathy, nonspecific etiology. No definite seizures or epileptiform discharges were seen throughout the recording. If suspicion for interictal activity remains a concern, a prolonged study should be considered. Priyanka Barbra Sarks   Overnight EEG with video  Result Date: 07/10/2021 Lora Havens, MD     07/11/2021  6:11 AM Patient Name: SALLY REIMERS MRN: [id number] Epilepsy Attending: Lora Havens Referring Physician/Provider: Dr Amie [place] Duration:  07/10/2021 2018 to 07/11/2021 0600  Patient history: 81yo m with right SDH, now with seizure like activity. EEG to evaluate for seizure  Level of alertness: lethargic  AEDs during EEG study: LEV, phenobarb  Technical aspects: This EEG study was done with scalp electrodes positioned according to the 10-20 International system of electrode placement. Electrical activity was acquired at a sampling rate of 500Hz  and reviewed with a high frequency filter of 70Hz  and a low frequency filter of 1Hz . EEG data were recorded continuously and digitally stored.  Description: No posterior dominant rhythm was seen. EEG showed continuous low amplitude 3 to 6 Hz theta-delta slowing In left hemisphere.  EEG also showed sharp waves in right frontal region which appeared quasi periodic at 1hz  and rhythmic admixed with 3-5hz  theta-delta slowing with evolution in morphology and frequency. 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 Phenobarbital load was give at around 0200 on 07/11/2021 after which eeg improved. EEG then showed polymorphic mixed frequencies in right hemisphere with predominantly 3-6hz  theta-delta slowing admixed with 15-18Hz  beta activity. Abundant sharp waves were also noted in right hemisphere, maximal right frontal region which were qasi periodic at 0.5-1hz .  Hyperventilation and photic stimulation were not performed.    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 Phenobarbital load was give at around 0200 on 07/11/2021 after which eeg improved and showed evidence of epileptogenicity and cortical dysfunction in right hemisphere, maximal right frontal region with high  potential for seizure recurrence. There was also moderate diffuse encephalopathy, nonspecific etiology but likely due to seizure. Dr Rory Percy was notified.  Lora Havens    DG C-Arm 1-60 Min-No Report  Result Date: 07/07/2021 Fluoroscopy was utilized by the requesting physician.  No radiographic interpretation.   DG C-Arm 1-60 Min-No Report  Result Date: 07/12/2021 Fluoroscopy was utilized by  the requesting physician.  No radiographic interpretation.   DG HIP UNILAT WITH PELVIS 1V RIGHT  Result Date: 07/07/2021 CLINICAL DATA:  Right total hip replacement EXAM: DG HIP (WITH OR WITHOUT PELVIS) 1V RIGHT COMPARISON:  None. FINDINGS: Intraoperative images during right total hip arthroplasty. Normal alignment without evidence of loosening or new fracture on the final image. IMPRESSION: Intraoperative images during right total hip arthroplasty. Normal alignment without evidence of immediate hardware complication. Electronically Signed   By: Maurine Simmering M.D.   On: 07/06/2021 16:22    Medications: Scheduled:  aspirin  81 mg Oral BID   Chlorhexidine Gluconate Cloth  6 each Topical Daily   cyanocobalamin  1,000 mcg Intramuscular Q0600   docusate sodium  100 mg Oral BID   folic acid  1 mg Oral Daily   LORazepam  2 mg Intravenous Once   multivitamin with minerals  1 tablet Oral Daily   PHENObarbital  65 mg Intravenous BID   senna  1 tablet Oral BID   [START ON 07/14/2021] thiamine  100 mg Oral Daily   Or   [START ON 07/14/2021] thiamine  100 mg Intravenous Daily   Continuous:  sodium chloride 100 mL/hr at 07/10/21 1639   levETIRAcetam 250 mg (07/10/21 2241)   methocarbamol (ROBAXIN) IV     thiamine injection     Assessment: Mr. Waide is an 81 year old African American gentleman with history as noted in HPI presenting to the hospital after multiple falls sustaining R acetabular and femoral fracture, for surgical fixation, which was performed by ortho on 1/13. Neurology iwas consulted for seizure  activity following surgery this AM.  - On exam he is significantly improved this AM. Now semi-awake with eyes closed and murmuring comprehensible responses to questions and stimuli, but not oriented.  - EEG: -Initial EEG on Friday revealed lateralized rhythmic delta activity within the right cerebral hemisphere ((LRDA) on a background of continuous generalized slowing. The findings were suggestive of cortical dysfunction arising from right hemisphere likely secondary to known underlying SDH. The rhythmic delta activity in right hemisphere (LRDA) was noted to be on the ictal-interictal continuum with low to intermediate potential for seizure.  - LTM EEG obtained overnight after transfer from [facility] to [facility] revealed findings suggestive of NCSE during the initial portion of the study, arising from the right hemisphere and maximal in the right frontal region. Phenobarbital load was given at around 0200 on 07/11/2021 after which eeg improved and showed evidence of epileptogenicity and cortical dysfunction in right hemisphere, maximal right frontal region with high potential for seizure recurrence. There was also moderate diffuse encephalopathy, nonspecific etiology but likely due to seizure.  - Seizures are most likely secondary to the subdural hematoma adjacent to the right anterior frontal lobe which is seen on CT - Has a history of EtOH abuse and has been placed on CIWA protocol  - Vitamin B12 is 331, which is low by neurological standards - Folate normal, TSH normal, T4 normal   Recommendations: MRI brain  Continue LTM EEG to assess for possible recurrence of seizure activity. Findings on most recent read, after phenobarbital load, were most consistent with cortical irritation with resolution of seizure activity.   Continue renally-dosed Keppra at 250 mg BID Continue scheduled phenobarbital at 65 mg IV BID Hold ASA and NSAIDS in setting of acute on chronic SDH  in favor of IV APAP Recommend high-dose PO  Vitamin B12 supplementation (2 mg po qd)      LOS: 3 days   @  Electronically signed: Dr. Kerney Elbe 07/11/2021  7:46 AM

## 2021-07-11 NOTE — Progress Notes (Addendum)
PROGRESS NOTE    Gary Stevenson  URK:[id number] DOB: [birth year] DOA: 06/30/2021 PCP: Seward Carol, MD    No chief complaint on file.   Brief Narrative:   Patient is 81 years old male with past medical history of right frontal subdural hematomas status post surgical evacuation in 2012, lung cancer status post lobectomy, alcohol abuse, CKD presented to hospital after sustaining a right femoral neck fracture and acetabular fracture.  Patient underwent right hip total arthroplasty on 07/27/2021.   patient was found to be responsive only to painful stimulus and was lethargic and was noted to have seizure-like activity around 5:30 AM on 1/13 . Medical team was consulted for lethargy and concerns for seizure that lasted for around 1 minute.  As well patient work-up was significant for anemia, encephalopathy, B12 deficiency, he transferred today to [facility] 07/11/2020.   Assessment & Plan:   Principal Problem:   Closed displaced fracture of right femoral neck (HCC) Active Problems:   Acute on chronic blood loss anemia   Displaced fracture of right femoral neck (HCC)   Protein-calorie malnutrition, severe   Pressure injury of skin     Toxic metabolic encephalopathy -Multifactorial, likely postictal, as well due to subdural hematoma -Patient remains altered this morning, will need NGT in next 24 to 48 hours if no improvement of mental status. -History alcohol abuse, will start on IV thiamine   Seizures -He is currently on LTM EEG, possibly related to brain bleed/history of brain trauma, as well possible alcohol withdrawals -Management per neurology.  acute on chronic subdural hematoma.   - Patient does have history of subdural hematoma status postevacuation 2012. -Currently appears to be related to his recent fall. -  hold aspirin and NSAIDs. -Neurology on board   Close displaced fracture of the right femoral neck.   - Status post surgical intervention.  Per orthopedic.   History  of recurrent falls in the past.  Will likely need skilled nursing facility placement   Acute on chronic Blood loss anemia. - Hgb down to 6 today, will transfuse 2 units PRBC, will check Hemoccult as well -Monitor CBC closely -G31 and folic acid is borderline low, started on IM supplements.   Severe protein calorie malnutrition.  Continue nutritional supplements.   Borderline hyperkalemia.  Latest potassium of 5.4.  We will continue to monitor   Elevated creatinine.  Creatinine of 1.6 today.  Creatinine at 1.3 on 07/07/2021.  On gentle IV fluids.  Continue to monitor  Hyperkalemia -Resolved  Failure to thrive/severe protein calorie malnutrition -Patient is extremely cachectic, BMI of 16.6, albumin of 1.5, severely malnutrition, if no improvement mental status likely will need cortrack in 24-48 hours. -Is extremely frail, deconditioned, with history of alcohol abuse, multiple falls, subdural hematoma, I have informed daughter his condition is tenuous/critical  Pressure ulcer -Continue with wound care. Pressure Injury 07/10/21 Sacrum Mid Stage 2 -  Partial thickness loss of dermis presenting as a shallow open injury with a red, pink wound bed without slough. 3 small 1X1 open areas (Active)  07/10/21 1700  Location: Sacrum  Location Orientation: Mid  Staging: Stage 2 -  Partial thickness loss of dermis presenting as a shallow open injury with a red, pink wound bed without slough.  Wound Description (Comments): 3 small 1X1 open areas  Present on Admission:        DVT prophylaxis: SCD Code Status: FU:: Family Communication: Discussed with daughter at bedside Disposition:   Status is: Inpatient  Remains inpatient appropriate  because: seizures, anemia       Consultants:  Orthopedic Neurology  Subjective:  Patient unable to provide any complaints, altered, no significant events overnight, no BM.  Objective: Vitals:   07/11/21 0835 07/11/21 0929 07/11/21 0944 07/11/21  1154  BP:  119/63 114/72 132/82  Pulse:  80 75 89  Resp:  15 20 18   Temp: 98.7 F (37.1 C) 98.3 F (36.8 C) 98.6 F (37 C)   TempSrc:  Oral Axillary   SpO2:  100% 100% 100%  Weight:      Height:        Intake/Output Summary (Last 24 hours) at 07/11/2021 1238 Last data filed at 07/10/2021 1616 Gross per 24 hour  Intake 588.74 ml  Output 175 ml  Net 413.74 ml   Filed Weights   07/20/2021 1700 07/22/2021 1129  Weight: 45.5 kg 45.5 kg    Examination:  Lethargic, does not follow any commands or answer any questions, open his eyes briefly to loud verbal stimuli, chronic ill-appearing, cachectic. Symmetrical Chest wall movement,diminished  entry at the bases RRR,No Gallops,Rubs or new Murmurs, No Parasternal Heave +ve B.Sounds, Abd Soft,scaphoid No Cyanosis, Clubbing or edema, right wound covered with absorbent gauze, with some bleed can be seen through with,     Data Reviewed: I have personally reviewed following labs and imaging studies  CBC: Recent Labs  Lab 07/13/2021 0833 07/25/2021 0658 07/10/21 0325 07/11/21 0744  WBC 8.1 7.7 11.6*  --   HGB 7.5* 10.8* 7.2* 6.0*  HCT 23.9* 32.3* 21.8* 18.3*  MCV 91.2 84.6 86.9  --   PLT 562* 505* 313  --     Basic Metabolic Panel: Recent Labs  Lab 07/07/2021 0833 07/07/2021 0658 07/10/21 0325 07/11/21 0744  NA 135 135 137 145  K 4.9 5.0 5.4* 4.2  CL 106 105 105 118*  CO2 20* 22 18* 17*  GLUCOSE 111* 84 134* 81  BUN 99* 81* 73* 54*  CREATININE 1.46* 1.32* 1.65* 1.59*  CALCIUM 8.9 8.5* 7.6* 7.1*  MG  --   --  1.9  --     GFR: Estimated Creatinine Clearance: 23.8 mL/min (A) (by C-G formula based on SCr of 1.59 mg/dL (H)).  Liver Function Tests: Recent Labs  Lab 07/03/2021 0833 07/07/2021 0658 07/11/21 0632 07/11/21 0744  AST 35 30 23 22   ALT 24 19 8 8   ALKPHOS 172* 167* 100 101  BILITOT 0.3 0.8 0.5 0.5  PROT 7.8 7.7 5.1* 5.2*  ALBUMIN 2.6* 2.3* 1.5* 1.5*    CBG: Recent Labs  Lab 07/10/2021 1706 07/02/2021 2213  07/10/21 0308  GLUCAP 85 160* 135*     Recent Results (from the past 240 hour(s))  Surgical pcr screen     Status: Abnormal   Collection Time: 06/29/2021  8:33 AM   Specimen: Nasal Mucosa; Nasal Swab  Result Value Ref Range Status   MRSA, PCR NEGATIVE NEGATIVE Final   Staphylococcus aureus POSITIVE (A) NEGATIVE Final    Comment: (NOTE) The Xpert SA Assay (FDA approved for NASAL specimens in patients 6 years of age and older), is one component of a comprehensive surveillance program. It is not intended to diagnose infection nor to guide or monitor treatment. Performed at [facility], [place] [address] [place], [place] [address]   SARS CORONAVIRUS 2 (TAT 6-24 HRS) Nasopharyngeal Nasopharyngeal Swab     Status: None   Collection Time: 07/12/2021  8:33 AM   Specimen: Nasopharyngeal Swab  Result Value Ref Range Status  SARS Coronavirus 2 NEGATIVE NEGATIVE Final    Comment: (NOTE) SARS-CoV-2 target nucleic acids are NOT DETECTED.  The SARS-CoV-2 RNA is generally detectable in upper and lower respiratory specimens during the acute phase of infection. Negative results do not preclude SARS-CoV-2 infection, do not rule out co-infections with other pathogens, and should not be used as the sole basis for treatment or other patient management decisions. Negative results must be combined with clinical observations, patient history, and epidemiological information. The expected result is Negative.  Fact Sheet for Patients: SugarRoll.be  Fact Sheet for Healthcare Providers: https://www.woods-mathews.com/  This test is not yet approved or cleared by the [place] FDA and  has been authorized for detection and/or diagnosis of SARS-CoV-2 by FDA under an Emergency Use Authorization (EUA). This EUA will remain  in effect (meaning this test can be used) for the duration of the COVID-19 declaration under Se ction 564(b)(1) of the  Act, 21 U.S.C. section 360bbb-3(b)(1), unless the authorization is terminated or revoked sooner.  Performed at [facility], [place] [address], [place], [place] [address]          Radiology Studies: CT HEAD WO CONTRAST (5MM)  Result Date: 07/10/2021 CLINICAL DATA:  Seizure, new onset. EXAM: CT HEAD WITHOUT CONTRAST TECHNIQUE: Contiguous axial images were obtained from the base of the skull through the vertex without intravenous contrast. RADIATION DOSE REDUCTION: This exam was performed according to the departmental dose-optimization program which includes automated exposure control, adjustment of the mA and/or kV according to patient size and/or use of iterative reconstruction technique. COMPARISON:  11/14/2005 FINDINGS: Brain: High to isodense subdural hematoma along the anterior and anterior right frontal convexity which measures up to 6 mm in thickness. No significant mass effect in the setting of brain atrophy. Brain atrophy is marked and significantly progressed from prior. Chronic asymmetric ischemia in the left occipital to parietal lobe. Confluent chronic small vessel ischemic gliosis in the deep white matter. No obstructive hydrocephalus or masslike finding. Vascular: No hyperdense vessel.  Atheromatous calcifications. Skull: Prior right-sided craniotomy, present on prior. There is severe C1-2 degeneration with subluxation and erosions, appearance similar to a November 2022 cervical spine CT. Sinuses/Orbits: No acute finding Critical Value/emergent results were called by telephone at the time of interpretation on 07/10/2021 at 6:57 am to provider [facility] , who verbally acknowledged these results. IMPRESSION: 1. Acute or subacute subdural hematoma along the right frontal convexity measuring up to 6 mm in thickness. No significant mass effect. 2. Advanced brain atrophy and chronic small vessel ischemia with significant progression from 2007. Electronically Signed   By: Jorje Guild M.D.   On: 07/10/2021 06:57   DG Pelvis Portable  Result Date: 07/15/2021 CLINICAL DATA:  Status post total right hip arthroplasty EXAM: PORTABLE PELVIS 1-2 VIEWS COMPARISON:  07/24/2021 intraoperative fluoroscopy, 07/13/2021 CT right hip FINDINGS: Interval total right hip arthroplasty. Expected postoperative changes including surgical skin staples, soft tissue swelling, and subcutaneous air. No perihardware lucency is seen to indicate hardware failure or loosening. Vascular calcifications are noted. Mild left femoroacetabular joint space narrowing. Diffuse decreased bone mineralization. IMPRESSION: Interval total right hip arthroplasty without evidence of hardware failure. Electronically Signed   By: Yvonne Kendall   On: 07/21/2021 18:48   EEG adult  Result Date: 07/10/2021 Lora Havens, MD     07/10/2021  5:25 PM Patient Name: Gary Stevenson MRN: [id number] Epilepsy Attending: Lora Havens Referring Physician/Provider: Lennie Hummer, PA-C Date: 07/10/2021 Duration: 29.11 mins Patient history:  81yo m with right SDH, now with seizure like activity. EEG to evaluate for seizure Level of alertness: lethargic AEDs during EEG study: LEV Technical aspects: This EEG study was done with scalp electrodes positioned according to the 10-20 International system of electrode placement. Electrical activity was acquired at a sampling rate of 500Hz  and reviewed with a high frequency filter of 70Hz  and a low frequency filter of 1Hz . EEG data were recorded continuously and digitally stored. Description: No posterior dominant rhythm was seen. EEG showed continuous low amplitude 3 to 6 Hz theta-delta slowing In left hemisphere. EEG also showed 3-5hz  theta-delta slowing in right hemisphere which is sharply contoured and rhythmic. There  is also intermittent 15-16hz  beta activity in right hemisphere. Hyperventilation and photic stimulation were not performed.   ABNORMALITY - Lateralized rhythmic delta activity, right  hemisphere ((LRDA) - Continuous slow, generalized IMPRESSION: This study is suggestive of cortical dysfunction arising from right hemisphere likely secondary to underlying SDH. The rhythmic delta activity in right hemisphere ( LRDA) is on the ictal-interictal continuum with low to intermediate potential for seizure. There is also moderate diffuse encephalopathy, nonspecific etiology. No definite seizures or epileptiform discharges were seen throughout the recording. If suspicion for interictal activity remains a concern, a prolonged study should be considered. Priyanka Barbra Sarks   Overnight EEG with video  Result Date: 07/10/2021 Lora Havens, MD     07/11/2021  9:45 AM Patient Name: Gary Stevenson MRN: [id number] Epilepsy Attending: Lora Havens Referring Physician/Provider: Dr Amie [place] Duration: 07/10/2021 2018 to 07/11/2021 0730  Patient history: 81yo m with right SDH, now with seizure like activity. EEG to evaluate for seizure  Level of alertness: lethargic  AEDs during EEG study: LEV, phenobarb  Technical aspects: This EEG study was done with scalp electrodes positioned according to the 10-20 International system of electrode placement. Electrical activity was acquired at a sampling rate of 500Hz  and reviewed with a high frequency filter of 70Hz  and a low frequency filter of 1Hz . EEG data were recorded continuously and digitally stored.  Description: No posterior dominant rhythm was seen. EEG showed continuous low amplitude 3 to 6 Hz theta-delta slowing In left hemisphere.  EEG also showed sharp waves in right frontal region which appeared quasi periodic at 1hz  and rhythmic admixed with 3-5hz  theta-delta slowing with evolution in morphology and frequency. 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 Phenobarbital load was give at around 0200 on 07/11/2021 after which eeg improved. EEG then showed polymorphic mixed  frequencies in right hemisphere with predominantly 3-6hz  theta-delta slowing admixed with 15-18Hz  beta activity. Abundant sharp waves were also noted in right hemisphere, maximal right frontal region which were qasi periodic at 0.5-1hz .  Hyperventilation and photic stimulation were not performed.    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 Phenobarbital load was give at around 0200 on 07/11/2021 after which eeg improved and showed evidence of epileptogenicity and cortical dysfunction in right hemisphere, maximal right frontal region with high potential for seizure recurrence. There was also moderate diffuse encephalopathy, nonspecific etiology but likely due to seizure. Dr Rory Percy was notified.  Lora Havens    DG C-Arm 1-60 Min-No Report  Result Date: 07/07/2021 Fluoroscopy was utilized by the requesting physician.  No radiographic  interpretation.   DG C-Arm 1-60 Min-No Report  Result Date: 07/23/2021 Fluoroscopy was utilized by the requesting physician.  No radiographic interpretation.   DG HIP UNILAT WITH PELVIS 1V RIGHT  Result Date: 07/26/2021 CLINICAL DATA:  Right total hip replacement EXAM: DG HIP (WITH OR WITHOUT PELVIS) 1V RIGHT COMPARISON:  None. FINDINGS: Intraoperative images during right total hip arthroplasty. Normal alignment without evidence of loosening or new fracture on the final image. IMPRESSION: Intraoperative images during right total hip arthroplasty. Normal alignment without evidence of immediate hardware complication. Electronically Signed   By: Maurine Simmering M.D.   On: 07/05/2021 16:22        Scheduled Meds:  Chlorhexidine Gluconate Cloth  6 each Topical Daily   cyanocobalamin  1,000 mcg Intramuscular  Q0600   docusate sodium  100 mg Oral BID   folic acid  1 mg Oral Daily   LORazepam  2 mg Intravenous Once   multivitamin with minerals  1 tablet Oral Daily   pantoprazole (PROTONIX) IV  40 mg Intravenous Q12H   PHENObarbital  65 mg Intravenous BID   senna  1 tablet Oral BID   [START ON 07/14/2021] thiamine  100 mg Oral Daily   Or   [START ON 07/14/2021] thiamine  100 mg Intravenous Daily   Continuous Infusions:  sodium chloride 100 mL/hr at 07/10/21 1639   levETIRAcetam 250 mg (07/11/21 1030)   methocarbamol (ROBAXIN) IV     thiamine injection 500 mg (07/11/21 1147)     LOS: 3 days      Phillips Climes, MD [facility]   To contact the attending provider between 7A-7P or the covering provider during after hours 7P-7A, please log into the web site [email or web address] and access using universal [place] password for that web site. If you do not have the password, please call the hospital operator.  07/11/2021, 12:38 PM   Patient ID: Gary Stevenson, male   DOB: Nov 15, 1940, 81 y.o.   MRN: [id number]

## 2021-07-11 NOTE — Progress Notes (Signed)
PT Cancellation Note  Patient Details Name: LARY ECKARDT MRN: [id number] DOB: 06/05/1941   Cancelled Treatment:    Reason Eval/Treat Not Completed: Medical issues which prohibited therapy Spoke with RN - reports pt having difficult morning, has just received 2 units of blood. Very lethargic.  Will follow-up this afternoon for attempt at formal evaluation for PT.   Ellouise Newer, PT, DPT 07/11/2021, 11:13 AM

## 2021-07-12 LAB — BASIC METABOLIC PANEL
Anion gap: 12 (ref 5–15)
BUN: 45 mg/dL — ABNORMAL HIGH (ref 8–23)
CO2: 15 mmol/L — ABNORMAL LOW (ref 22–32)
Calcium: 7.7 mg/dL — ABNORMAL LOW (ref 8.9–10.3)
Chloride: 119 mmol/L — ABNORMAL HIGH (ref 98–111)
Creatinine, Ser: 1.51 mg/dL — ABNORMAL HIGH (ref 0.61–1.24)
GFR, Estimated: 46 mL/min — ABNORMAL LOW (ref >60)
Glucose, Bld: 60 mg/dL — ABNORMAL LOW (ref 70–99)
Potassium: 4.5 mmol/L (ref 3.5–5.1)
Sodium: 146 mmol/L — ABNORMAL HIGH (ref 135–145)

## 2021-07-12 LAB — TYPE AND SCREEN
ABO/RH(D): A POS
Antibody Screen: NEGATIVE
Unit division: 0
Unit division: 0

## 2021-07-12 LAB — BPAM RBC
Blood Product Expiration Date: 202301222359
Blood Product Expiration Date: 202302072359
ISSUE DATE / TIME: 202301140925
ISSUE DATE / TIME: 202301141349
Unit Type and Rh: 6200
Unit Type and Rh: 6200

## 2021-07-12 LAB — PHOSPHORUS: Phosphorus: 3.8 mg/dL (ref 2.5–4.6)

## 2021-07-12 LAB — CBC
HCT: 28.7 % — ABNORMAL LOW (ref 39.0–52.0)
Hemoglobin: 9.7 g/dL — ABNORMAL LOW (ref 13.0–17.0)
MCH: 29.8 pg (ref 26.0–34.0)
MCHC: 33.8 g/dL (ref 30.0–36.0)
MCV: 88 fL (ref 80.0–100.0)
Platelets: 307 10*3/uL (ref 150–400)
RBC: 3.26 MIL/uL — ABNORMAL LOW (ref 4.22–5.81)
RDW: 17.1 % — ABNORMAL HIGH (ref 11.5–15.5)
WBC: 11.3 10*3/uL — ABNORMAL HIGH (ref 4.0–10.5)
nRBC: 0 % (ref 0.0–0.2)

## 2021-07-12 LAB — GLUCOSE, CAPILLARY
Glucose-Capillary: 61 mg/dL — ABNORMAL LOW (ref 70–99)
Glucose-Capillary: 76 mg/dL (ref 70–99)

## 2021-07-12 MED ORDER — OSMOLITE 1.2 CAL PO LIQD
1000.0000 mL | ORAL | Status: DC
  Administered 2021-07-13 – 2021-07-14 (×2): 1000 mL
  Filled 2021-07-12 (×2): qty 1000

## 2021-07-12 MED ORDER — SODIUM CHLORIDE 0.45 % IV SOLN: INTRAVENOUS | Status: DC

## 2021-07-12 MED ORDER — DEXTROSE-NACL 5-0.45 % IV SOLN: INTRAVENOUS | Status: DC

## 2021-07-12 MED ORDER — FOLIC ACID 5 MG/ML IJ SOLN
1.0000 mg | Freq: Every day | INTRAMUSCULAR | Status: DC
  Administered 2021-07-12 – 2021-07-19 (×8): 1 mg via INTRAVENOUS
  Filled 2021-07-12 (×10): qty 0.2

## 2021-07-12 MED ORDER — SODIUM CHLORIDE 0.9 % IV SOLN: 1.0000 mg | Freq: Every day | INTRAVENOUS | Status: DC

## 2021-07-12 MED ORDER — PROSOURCE TF PO LIQD: 45.0000 mL | Freq: Two times a day (BID) | ORAL | Status: DC

## 2021-07-12 MED ORDER — VITAL HIGH PROTEIN PO LIQD: 1000.0000 mL | ORAL | Status: DC

## 2021-07-12 MED ORDER — FREE WATER
105.0000 mL | Freq: Four times a day (QID) | Status: DC
  Administered 2021-07-14 – 2021-07-16 (×8): 105 mL

## 2021-07-12 NOTE — Progress Notes (Signed)
Subjective: Continues to improve.   Objective: Current vital signs: BP 134/71 (BP Location: Left Arm)    Pulse 83    Temp 97.6 F (36.4 C) (Oral)    Resp 16    Ht 5\' 5"  (1.651 m)    Wt 45.5 kg    SpO2 100%    BMI 16.69 kg/m  Vital signs in last 24 hours: Temp:  [97.4 F (36.3 C)-98.9 F (37.2 C)] 97.6 F (36.4 C) (01/15 0347) Pulse Rate:  [75-91] 83 (01/15 0728) Resp:  [15-25] 16 (01/15 0728) BP: (114-143)/(63-91) 134/71 (01/15 0728) SpO2:  [100 %] 100 % (01/15 0728)  Intake/Output from previous day: 01/14 0701 - 01/15 0700 In: 1311.7 [I.V.:549.7; Blood:406; IV Piggyback:356] Out: 1900 [[id number]:[id number]] Intake/Output this shift: No intake/output data recorded. Nutritional status:  Diet Order             Diet NPO time specified  Diet effective now                   HEENT: EEG leads in place. [place]/AT Lungs: Respirations unlabored Ext: Dressing to right hip is C/D/I   Neurologic Exam: Ment: Significantly improved this AM, continuing the trend from yesterday. Now awake with eyes open and giving easily audible verbal responses to questions. Follows some simple commands. Says "Hey, how ya doin' man" when examiner says hello. Some of his responses do not pertain to the questions asked but speech is fluent and non-dysarthric. Not oriented to situation, location or time.  CN: Fixates and tracks normally. Face symmetric. Phonation intact. Head is midline.  Motor: Moves all 4 extremities equally and elevates BUE without drift, but does not cooperate with formal strength testing. Sensory: Reacts to stimuli in each extremity Cerebellar: Not following commands Gait: Unable to assess due to falls risk concerns.     Lab Results: Results for orders placed or performed during the hospital encounter of 07/25/2021 (from the past 48 hour(s))  Type and screen [place]     Status: None   Collection Time: 07/10/21  8:15 PM  Result Value Ref Range   ABO/RH(D) A POS     Antibody Screen NEG    Sample Expiration      07/11/2021,2359 Performed at [facility], [place] [address] [place], [place] [address]   Hepatic function panel     Status: Abnormal   Collection Time: 07/11/21  6:32 AM  Result Value Ref Range   Total Protein 5.1 (L) 6.5 - 8.1 g/dL   Albumin 1.5 (L) 3.5 - 5.0 g/dL   AST 23 15 - 41 U/L   ALT 8 0 - 44 U/L   Alkaline Phosphatase 100 38 - 126 U/L   Total Bilirubin 0.5 0.3 - 1.2 mg/dL   Bilirubin, Direct <0.1 0.0 - 0.2 mg/dL   Indirect Bilirubin NOT CALCULATED 0.3 - 0.9 mg/dL    Comment: Performed at [facility] [address], [place], [place] [address]  Basic metabolic panel     Status: Abnormal   Collection Time: 07/11/21  7:44 AM  Result Value Ref Range   Sodium 145 135 - 145 mmol/L   Potassium 4.2 3.5 - 5.1 mmol/L   Chloride 118 (H) 98 - 111 mmol/L   CO2 17 (L) 22 - 32 mmol/L   Glucose, Bld 81 70 - 99 mg/dL    Comment: Glucose reference range applies only to samples taken after fasting for at least 8 hours.   BUN 54 (H)  8 - 23 mg/dL   Creatinine, Ser 1.59 (H) 0.61 - 1.24 mg/dL   Calcium 7.1 (L) 8.9 - 10.3 mg/dL   GFR, Estimated 44 (L) >60 mL/min    Comment: (NOTE) Calculated using the CKD-EPI Creatinine Equation (2021)    Anion gap 10 5 - 15    Comment: Performed at [facility] [address], [place], [place] [address]  Hepatic function panel     Status: Abnormal   Collection Time: 07/11/21  7:44 AM  Result Value Ref Range   Total Protein 5.2 (L) 6.5 - 8.1 g/dL   Albumin 1.5 (L) 3.5 - 5.0 g/dL   AST 22 15 - 41 U/L   ALT 8 0 - 44 U/L   Alkaline Phosphatase 101 38 - 126 U/L   Total Bilirubin 0.5 0.3 - 1.2 mg/dL   Bilirubin, Direct <0.1 0.0 - 0.2 mg/dL   Indirect Bilirubin NOT CALCULATED 0.3 - 0.9 mg/dL    Comment: Performed at [facility] [address] [place], [place] [address]  Hemoglobin and hematocrit, blood     Status: Abnormal   Collection Time: 07/11/21  7:44 AM  Result Value Ref Range    Hemoglobin 6.0 (LL) 13.0 - 17.0 g/dL    Comment: REPEATED TO VERIFY THIS CRITICAL RESULT HAS VERIFIED AND BEEN CALLED TO RN,RYAN SANTIAGO BY [facility] [place] ON 01 14 2023 AT 0804, AND HAS BEEN READ BACK.     HCT 18.3 (L) 39.0 - 52.0 %    Comment: Performed at [facility], [place] [address], [place], [place] [address]  Type and screen [place]     Status: None (Preliminary result)   Collection Time: 07/11/21  7:44 AM  Result Value Ref Range   ABO/RH(D) A POS    Antibody Screen NEG    Sample Expiration 07/14/2021,2359    Unit Number [id number]    Blood Component Type RED CELLS,LR    Unit division 00    Status of Unit ISSUED    Transfusion Status OK TO TRANSFUSE    Crossmatch Result Compatible    Unit Number [id number]    Blood Component Type RED CELLS,LR    Unit division 00    Status of Unit ISSUED    Transfusion Status OK TO TRANSFUSE    Crossmatch Result      Compatible Performed at [facility], [employer] [address], [place], [place] [address]   Prepare RBC (crossmatch)     Status: None   Collection Time: 07/11/21  8:10 AM  Result Value Ref Range   Order Confirmation      ORDERS RECEIVED TO CROSSMATCH Performed at [facility], [address] [place], [place] [address]   CBC     Status: Abnormal   Collection Time: 07/11/21  6:35 PM  Result Value Ref Range   WBC 11.2 (H) 4.0 - 10.5 K/uL   RBC 2.99 (L) 4.22 - 5.81 MIL/uL   Hemoglobin 8.9 (L) 13.0 - 17.0 g/dL    Comment: REPEATED TO VERIFY POST TRANSFUSION SPECIMEN    HCT 26.2 (L) 39.0 - 52.0 %   MCV 87.6 80.0 - 100.0 fL   MCH 29.8 26.0 - 34.0 pg   MCHC 34.0 30.0 - 36.0 g/dL   RDW 17.0 (H) 11.5 - 15.5 %   Platelets 280 150 - 400 K/uL   nRBC 0.0 0.0 - 0.2 %    Comment: Performed at [facility], [address] [place], [place] [address]  CBC     Status: Abnormal   Collection Time: 07/12/21  1:35 AM  Result Value Ref Range   WBC 11.3 (H) 4.0 - 10.5 K/uL   RBC 3.26 (L) 4.22  - 5.81 MIL/uL   Hemoglobin 9.7 (L) 13.0 - 17.0 g/dL   HCT 28.7 (L) 39.0 - 52.0 %   MCV 88.0 80.0 - 100.0 fL   MCH 29.8 26.0 - 34.0 pg   MCHC 33.8 30.0 - 36.0 g/dL   RDW 17.1 (H) 11.5 - 15.5 %   Platelets 307 150 - 400 K/uL   nRBC 0.0 0.0 - 0.2 %    Comment: Performed at [facility] [address], [place], [place] [address]  Basic metabolic panel     Status: Abnormal   Collection Time: 07/12/21  1:35 AM  Result Value Ref Range   Sodium 146 (H) 135 - 145 mmol/L   Potassium 4.5 3.5 - 5.1 mmol/L   Chloride 119 (H) 98 - 111 mmol/L   CO2 15 (L) 22 - 32 mmol/L   Glucose, Bld 60 (L) 70 - 99 mg/dL    Comment: Glucose reference range applies only to samples taken after fasting for at least 8 hours.   BUN 45 (H) 8 - 23 mg/dL   Creatinine, Ser 1.51 (H) 0.61 - 1.24 mg/dL   Calcium 7.7 (L) 8.9 - 10.3 mg/dL   GFR, Estimated 46 (L) >60 mL/min    Comment: (NOTE) Calculated using the CKD-EPI Creatinine Equation (2021)    Anion gap 12 5 - 15    Comment: Performed at [facility] [address], [place], [place] [address]    Recent Results (from the past 240 hour(s))  Surgical pcr screen     Status: Abnormal   Collection Time: 07/15/2021  8:33 AM   Specimen: Nasal Mucosa; Nasal Swab  Result Value Ref Range Status   MRSA, PCR NEGATIVE NEGATIVE Final   Staphylococcus aureus POSITIVE (A) NEGATIVE Final    Comment: (NOTE) The Xpert SA Assay (FDA approved for NASAL specimens in patients 45 years of age and older), is one component of a comprehensive surveillance program. It is not intended to diagnose infection nor to guide or monitor treatment. Performed at [facility], [place] [address] [place], [place] [address]   SARS CORONAVIRUS 2 (TAT 6-24 HRS) Nasopharyngeal Nasopharyngeal Swab     Status: None   Collection Time: 07/12/2021  8:33 AM   Specimen: Nasopharyngeal Swab  Result Value Ref Range Status   SARS Coronavirus 2 NEGATIVE NEGATIVE Final    Comment:  (NOTE) SARS-CoV-2 target nucleic acids are NOT DETECTED.  The SARS-CoV-2 RNA is generally detectable in upper and lower respiratory specimens during the acute phase of infection. Negative results do not preclude SARS-CoV-2 infection, do not rule out co-infections with other pathogens, and should not be used as the sole basis for treatment or other patient management decisions. Negative results must be combined with clinical observations, patient history, and epidemiological information. The expected result is Negative.  Fact Sheet for Patients: SugarRoll.be  Fact Sheet for Healthcare Providers: https://www.woods-mathews.com/  This test is not yet approved or cleared by the [place] FDA and  has been authorized for detection and/or diagnosis of SARS-CoV-2 by FDA under an Emergency Use Authorization (EUA). This EUA will remain  in effect (meaning this test can be used) for the duration of the COVID-19 declaration under Se ction 564(b)(1) of the  Act, 21 U.S.C. section 360bbb-3(b)(1), unless the authorization is terminated or revoked sooner.  Performed at [facility], [place] [address] [place], [place] [address]     Lipid Panel No results for input(s): CHOL, TRIG, HDL, CHOLHDL, VLDL, LDLCALC in the last 72 hours.  Studies/Results: EEG adult  Result Date: 07/10/2021 Lora Havens, MD     07/10/2021  5:25 PM Patient Name: Gary Stevenson MRN: [id number] Epilepsy Attending: Lora Havens Referring Physician/Provider: Lennie Hummer, PA-C Date: 07/10/2021 Duration: 29.11 mins Patient history: 81yo m with right SDH, now with seizure like activity. EEG to evaluate for seizure Level of alertness: lethargic AEDs during EEG study: LEV Technical aspects: This EEG study was done with scalp electrodes positioned according to the 10-20 International system of electrode placement. Electrical activity was acquired at a sampling rate of 500Hz  and  reviewed with a high frequency filter of 70Hz  and a low frequency filter of 1Hz . EEG data were recorded continuously and digitally stored. Description: No posterior dominant rhythm was seen. EEG showed continuous low amplitude 3 to 6 Hz theta-delta slowing In left hemisphere. EEG also showed 3-5hz  theta-delta slowing in right hemisphere which is sharply contoured and rhythmic. There  is also intermittent 15-16hz  beta activity in right hemisphere. Hyperventilation and photic stimulation were not performed.   ABNORMALITY - Lateralized rhythmic delta activity, right hemisphere ((LRDA) - Continuous slow, generalized IMPRESSION: This study is suggestive of cortical dysfunction arising from right hemisphere likely secondary to underlying SDH. The rhythmic delta activity in right hemisphere ( LRDA) is on the ictal-interictal continuum with low to intermediate potential for seizure. There is also moderate diffuse encephalopathy, nonspecific etiology. No definite seizures or epileptiform discharges were seen throughout the recording. If suspicion for interictal activity remains a concern, a prolonged study should be considered. Priyanka Barbra Sarks   Overnight EEG with video  Result Date: 07/10/2021 Lora Havens, MD     07/11/2021  9:45 AM Patient Name: FORREST JAROSZEWSKI MRN: [id number] Epilepsy Attending: Lora Havens Referring Physician/Provider: Dr Amie [place] Duration: 07/10/2021 2018 to 07/11/2021 0730  Patient history: 81yo m with right SDH, now with seizure like activity. EEG to evaluate for seizure  Level of alertness: lethargic  AEDs during EEG study: LEV, phenobarb  Technical aspects: This EEG study was done with scalp electrodes positioned according to the 10-20 International system of electrode placement. Electrical activity was acquired at a sampling rate of 500Hz  and reviewed with a high frequency filter of 70Hz  and a low frequency filter of 1Hz . EEG data were recorded continuously and digitally stored.   Description: No posterior dominant rhythm was seen. EEG showed continuous low amplitude 3 to 6 Hz theta-delta slowing In left hemisphere.  EEG also showed sharp waves in right frontal region which appeared quasi periodic at 1hz  and rhythmic admixed with 3-5hz  theta-delta slowing with evolution in morphology and frequency. 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 Phenobarbital load was give at around 0200 on 07/11/2021 after which eeg improved. EEG then showed polymorphic mixed frequencies in right hemisphere with predominantly 3-6hz  theta-delta slowing admixed with 15-18Hz  beta activity. Abundant sharp waves were also noted in right hemisphere, maximal right frontal region which were qasi periodic at 0.5-1hz .  Hyperventilation and photic stimulation were not performed.    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 Phenobarbital load was give at around 0200 on 07/11/2021 after which eeg improved and showed evidence of epileptogenicity and cortical dysfunction in right hemisphere, maximal right frontal region with high potential for seizure recurrence. There was also moderate diffuse encephalopathy, nonspecific etiology but likely due to seizure. Dr Rory Percy was notified.  Priyanka Barbra Sarks     Medications: Scheduled:  Chlorhexidine Gluconate Cloth  6 each Topical Daily   cyanocobalamin  1,000 mcg Intramuscular Q0600   docusate sodium  100 mg Oral BID   folic acid  1 mg Oral Daily   LORazepam  2 mg Intravenous Once   multivitamin with minerals  1 tablet Oral Daily   pantoprazole (PROTONIX) IV  40 mg Intravenous Q12H   PHENObarbital  65 mg Intravenous BID    senna  1 tablet Oral BID   [START ON 07/14/2021] thiamine  100 mg Oral Daily   Or   [START ON 07/14/2021] thiamine  100 mg Intravenous Daily   Continuous:  sodium chloride     levETIRAcetam 250 mg (07/11/21 2014)   methocarbamol (ROBAXIN) IV     thiamine injection 500 mg (07/11/21 1147)    Assessment: Gary Stevenson is an 81 year old African American gentleman with history as noted in HPI presenting to the hospital after multiple falls sustaining R acetabular and femoral fracture. Surgical fixation was performed by ortho on 1/13. Neurology was consulted for seizure activity following surgery on Friday morning.  - Examinations: - On exam yesterday, he was significantly improved, semi-awake with eyes closed and murmuring comprehensible responses to questions and stimuli, but not oriented.  - Exam today reveals significant improvement in his mentation, but still disoriented with poor situational awareness.  - EEG: -Initial EEG on Friday revealed lateralized rhythmic delta activity within the right cerebral hemisphere ((LRDA) on a background of continuous generalized slowing. The findings were suggestive of cortical dysfunction arising from right hemisphere likely secondary to known underlying SDH. The rhythmic delta activity in right hemisphere (LRDA) was noted to be on the ictal-interictal continuum with low to intermediate potential for seizure.  - LTM EEG obtained overnight 1/13-14 after [facility] to [facility] revealed findings suggestive of NCSE during the initial portion of the study, arising from the right hemisphere and maximal in the right frontal region. Phenobarbital load was given at around 0200 on 07/11/2021 after which eeg improved and showed evidence of epileptogenicity and cortical dysfunction in right hemisphere, maximal right frontal region with high potential for seizure recurrence. There was also moderate diffuse encephalopathy, nonspecific etiology but likely due to seizure.  - LTM EEG  results documented in today's report (Sunday): Abnormal continuous video EEG due to right frontal focal slowing with some intermixed sharp waves, indicative of a focal cerebral lesion with epileptogenic potential in that region. No electrographic seizures were seen.  - Seizures are most likely secondary to the subdural hematoma adjacent to the right anterior frontal lobe which is seen on CT - Has a history of EtOH abuse and has been placed on CIWA protocol    Recommendations: MRI brain  Discontinuing LTM EEG.   Continue renally-dosed Keppra at 250 mg BID Continue scheduled phenobarbital at 65 mg IV BID Hold ASA and NSAIDS in setting of acute on chronic SDH  in favor of IV APAP Recommend high-dose PO Vitamin B12 supplementation for low-normal B12 level (administer 2 mg po qd)  PT/OT/Speech. Falls precautions. In this  context, goal should be OOB to chair as soon as able. May need a posey vest while in chair.   Inpatient seizure precautions.  Outpatient seizure precautions. Per [facility] statutes, patients with seizures are not allowed to drive until  they have been seizure-free for six months. Use caution when using heavy equipment or power tools. Avoid working on ladders or at heights. Take showers instead of baths. Ensure the water temperature is not too high on the home water heater. Do not go swimming alone. When caring for infants or small children, sit down when holding, feeding, or changing them to minimize risk of injury to the child in the event you have a seizure. Also, Maintain good sleep hygiene. Avoid alcohol.  Neurology service will sign off for now. Please call us if MRI shows acute abnormality or a change to his subdural hematoma.     LOS: 4 days   @Electronically  signed: Dr. Kerney Elbe 07/12/2021  8:47 AM

## 2021-07-12 NOTE — Evaluation (Signed)
Physical Therapy Evaluation Patient Details Name: Gary Stevenson MRN: [id number] DOB: [birth year] Today's Date: 07/12/2021  History of Present Illness  81 years old male with past medical history of right frontal subdural hematomas 2012, lung cancer, alcohol abuse, CKD right femoral neck and acetabular fracture. Pt underwent right hip total arthroplasty on 07/19/2021.   Pt found to be responsive post-op and noted to have seizure-like activity around 5:30 AM on 1/13 . Work-up was significant for anemia, encephalopathy, B12 deficiency.  Clinical Impression  Patient is s/p above surgery and complications presenting in functional limitations due to the deficits listed below (see PT Problem List). Due to lethargy and waxing/waning focus, pt required max assist with bed mobility and sit<>stand transfer. He progressed to mod assist level with standing while holding RW, demonstrating difficulty with trunk control. Family reports pt Mod I prior to current status and he will be living with daughter when he is able to return home safely May be a good candidate for CIR if his cognitive status continues to improve rapidly and is able to participate in higher level rehab activities. Patient will benefit from skilled PT to increase their independence and safety with mobility to allow discharge to the venue listed below.          Recommendations for follow up therapy are one component of a multi-disciplinary discharge planning process, led by the attending physician.  Recommendations may be updated based on patient status, additional functional criteria and insurance authorization.  Follow Up Recommendations Acute inpatient rehab (3hours/day) (Hopeful that patient continues to show rapid progress during acute stay; if he fails to do so, he would be more appropriate for SNF)    Assistance Recommended at Discharge Frequent or constant Supervision/Assistance  Patient can return home with the following  Two people to  help with walking and/or transfers;Two people to help with bathing/dressing/bathroom;Assistance with cooking/housework;Help with stairs or ramp for entrance;Assist for transportation    Equipment Recommendations None recommended by PT (TBD next venue of care)  Recommendations for Other Services  Rehab consult    Functional Status Assessment Patient has had a recent decline in their functional status and demonstrates the ability to make significant improvements in function in a reasonable and predictable amount of time.     Precautions / Restrictions Precautions Precautions: Fall (seizure, Direct ant THA Rt) Precaution Comments: seizures post op Restrictions Weight Bearing Restrictions: Yes RLE Weight Bearing: Weight bearing as tolerated      Mobility  Bed Mobility Overal bed mobility: Needs Assistance Bed Mobility: Supine to Sit;Sit to Supine     Supine to sit: Max assist;HOB elevated Sit to supine: Max assist   General bed mobility comments: Max assist with bed mobility, difficulty following commands, Trunk very unsteady, needed facilitation for LEs, especially RT. Cues for sequencing throughout. Max assist for supine<>sit transitions. Pt was able to push through LLE in modified bridge position to lightly assist with scoot to [facility] at end.    Transfers Overall transfer level: Needs assistance Equipment used: Rolling walker (2 wheels);1 person hand held assist Transfers: Sit to/from Stand Sit to Stand: Max assist;From elevated surface           General transfer comment: Max assist for boost and balance initial stand from bed with single UE support on Rt hand in hand with Rt knee block. Progressed with RW upon standing to stabilize and attempt weight shifting activity with pre-gait, unable to lift either LE from the floor but did tolerate standing for a  couple of minutes at EOB. Demonstrates notable trunk sway, able to extend hip and knees to stand upright with frequent cues.  Mod assist on second attempt to rise and remain standing.    Ambulation/Gait                  Stairs            Wheelchair Mobility    Modified Rankin (Stroke Patients Only)       Balance Overall balance assessment: Needs assistance Sitting-balance support: Bilateral upper extremity supported;Feet supported Sitting balance-Leahy Scale: Poor Sitting balance - Comments: Sat majority of therapy session working on weight shifting, finding midline, and reaching towards LOS - poor control, some involuntary sway.   Standing balance support: Bilateral upper extremity supported Standing balance-Leahy Scale: Poor Standing balance comment: Mod assist for balance in standing, tolerates weightbearing and lateral weight shift well. Unable to lift feet for pre-gait stepping task.                             Pertinent Vitals/Pain Pain Assessment: Faces Faces Pain Scale: Hurts a little bit Pain Location: Rt hip Pain Descriptors / Indicators: Aching Pain Intervention(s): Limited activity within patient's tolerance;Premedicated before session;Repositioned    Home Living Family/patient expects to be discharged to:: Unsure Living Arrangements: Children (Has been living with daughter) Available Help at Discharge: Family;Available 24 hours/day Type of Home: House Home Access: Stairs to enter Entrance Stairs-Rails: Right;Left;Can reach both Entrance Stairs-Number of Steps: 2   Home Layout: One level Home Equipment: Cane - single point      Prior Function Prior Level of Function : Independent/Modified Independent;History of Falls (last six months) (family provides Hx)             Mobility Comments: Walking with SPC. Reports falls. family reports otherwise independent with ADLs and ambulation. Walks outside with [facility], able to take out trash.       Hand Dominance   Dominant Hand: Right    Extremity/Trunk Assessment   Upper Extremity Assessment Upper  Extremity Assessment: Defer to OT evaluation    Lower Extremity Assessment Lower Extremity Assessment: Generalized weakness;Difficult to assess due to impaired cognition;RLE deficits/detail RLE Deficits / Details: Weakness likely from THA on Rt       Communication   Communication: Expressive difficulties  Cognition Arousal/Alertness: Lethargic Behavior During Therapy: Flat affect;Impulsive Overall Cognitive Status: Difficult to assess                                 General Comments: Limited verbal responses from patient during evaluation        General Comments General comments (skin integrity, edema, etc.): IV in Rt arm began to leak and bleed, RN notified, tape still in place. VSS throughout session.    Exercises General Exercises - Lower Extremity Ankle Circles/Pumps: AAROM;Both;10 reps;Supine Short Arc Quad: AAROM;Both;5 reps Heel Slides: AAROM;Both;5 reps;Supine   Assessment/Plan    PT Assessment Patient needs continued PT services  PT Problem List Decreased strength;Decreased range of motion;Decreased activity tolerance;Decreased balance;Decreased mobility;Decreased coordination;Decreased cognition;Decreased knowledge of use of DME;Decreased safety awareness       PT Treatment Interventions DME instruction;Gait training;Functional mobility training;Therapeutic activities;Therapeutic exercise;Balance training;Neuromuscular re-education;Patient/family education;Cognitive remediation    PT Goals (Current goals can be found in the Care Plan section)  Acute Rehab PT Goals Patient Stated Goal: n/a PT Goal Formulation: With family  Time For Goal Achievement: 07/26/21 Potential to Achieve Goals: Good    Frequency Min 4X/week     Co-evaluation               AM-PAC PT "6 Clicks" Mobility  Outcome Measure Help needed turning from your back to your side while in a flat bed without using bedrails?: A Lot Help needed moving from lying on your back to  sitting on the side of a flat bed without using bedrails?: A Lot Help needed moving to and from a bed to a chair (including a wheelchair)?: A Lot Help needed standing up from a chair using your arms (e.g., wheelchair or bedside chair)?: A Lot Help needed to walk in hospital room?: Total Help needed climbing 3-5 steps with a railing? : Total 6 Click Score: 10    End of Session Equipment Utilized During Treatment: Gait belt Activity Tolerance: Patient limited by lethargy Patient left: in bed;with call bell/phone within reach;with bed alarm set;with SCD's reapplied;with family/visitor present Nurse Communication: Mobility status (IV leaking) PT Visit Diagnosis: Unsteadiness on feet (R26.81);Other abnormalities of gait and mobility (R26.89);Muscle weakness (generalized) (M62.81);Difficulty in walking, not elsewhere classified (R26.2);Other symptoms and signs involving the nervous system (R29.898)    Time: 3491-7915 PT Time Calculation (min) (ACUTE ONLY): 38 min   Charges:   PT Evaluation $PT Eval Moderate Complexity: 1 Mod PT Treatments $Therapeutic Activity: 23-37 mins        Candie Mile, PT, DPT  Ellouise Newer 07/12/2021, 3:56 PM

## 2021-07-12 NOTE — Progress Notes (Signed)
Nutrition Follow-up  DOCUMENTATION CODES:   Severe malnutrition in context of chronic illness, Underweight  INTERVENTION:   -Once cortrak tube is placed:  Initiate Osmolite 1.2 @ 20 ml/hr and increase by 10 ml every 12 hours to goal rate of 60 ml/hr.   If no IVFS, 105 ml free water flush every 6 hours  Tube feeding regimen provides 1728 kcal (100% of needs), 80 grams of protein, and 1180 ml of H2O.  Total free water: 1600 ml daily  -Monitor Mg, K, and Phos daily and replete as needed secondary to high refeeding risk  NUTRITION DIAGNOSIS:   Severe Malnutrition related to chronic illness as evidenced by severe fat depletion, severe muscle depletion.  Ongoing  GOAL:   Patient will meet greater than or equal to 90% of their needs  Unmet  MONITOR:   Diet advancement, PO intake, Supplement acceptance, Labs, Weight trends, Skin  REASON FOR ASSESSMENT:   Consult Hip fracture protocol  ASSESSMENT:   81 y.o. male with medical history of alcohol abuse, lung cancer, brain aneurysm, and arthritis. He was admitted due to low hemoglobin with hx of chronic anemia in preparation for elective total R hip replacement surgery.  1/12- s/p PROCEDURE: Right total hip arthroplasty, anterior approach; s/p seizure after surgery  Reviewed I/O's: -588 ml x 24 hours and +2.9 L since admission  UOP: 1.9 L x 24 hours   Pt currently lethargic, however, will arouse for family members. Per MD notes, pt has a history of heavy ETOH abuse. Pt is NPO and plan to place cortrak tomorrow for enteral nutrition.   Medications reviewed and include vitamin J-69, colace, folic acid, senokot, thiamine, dextrose 5%-0.45% sodium chloride infusion @ 100 ml/hr, thiamine, and keppra.   Labs reviewed: Na: 146.    Diet Order:   Diet Order             Diet NPO time specified  Diet effective now                   EDUCATION NEEDS:   No education needs have been identified at this time  Skin:  Skin  Assessment: Skin Integrity Issues: Skin Integrity Issues:: Stage II, Incisions Stage II: sacrum Incisions: closed rt hip  Last BM:  Unknown  Height:   Ht Readings from Last 1 Encounters:  07/10/21 5\' 5"  (1.651 m)    Weight:   Wt Readings from Last 1 Encounters:  06/30/2021 45.5 kg    Ideal Body Weight:  56.8 kg  BMI:  Body mass index is 16.69 kg/m.  Estimated Nutritional Needs:   Kcal:  1600-1800 kcal  Protein:  80-95 grams  Fluid:  >/= 1.7 L/day    Loistine Chance, RD, LDN, [place] Registered Dietitian II Certified Diabetes Care and Education Specialist Please refer to [facility] for RD and/or RD on-call/weekend/after hours pager

## 2021-07-12 NOTE — Procedures (Signed)
EEG Procedure CPT/Type of Study: 38329; 24hr EEG with video Referring Provider: Elgergawy Primary Neurological Diagnosis: seizure, status epilepticus, SDH  History: This is a 81 yr old patient, undergoing an EEG to evaluate for SDH, seizures. Clinical State: disoriented  Technical Description:  The EEG was performed using standard setting per the guidelines of American Clinical Neurophysiology Society (ACNS).  A minimum of 21 electrodes were placed on scalp according to the International 10-20 or/and 10-10 Systems. Supplemental electrodes were placed as needed. Single EKG electrode was also used to detect cardiac arrhythmia. Patient's behavior was continuously recorded on video simultaneously with EEG. A minimum of 16 channels were used for data display. Each epoch of study was reviewed manually daily and as needed using standard referential and bipolar montages. Computerized quantitative EEG analysis (such as compressed spectral array analysis, trending, automated spike & seizure detection) were used as indicated.   Day 2: from 0730 07/11/21 to 0730 07/12/21  EEG Description: Overall Amplitude:Normal Predominant Frequency: The background activity showed theta, with about 4-6 Hz, that was frequent. Superimposed Frequencies: occasional delta and some beta activity bilaterally The background was symmetric  Background Abnormalities: focal slowing; right central delta-theta focal slowing Rhythmic or periodic pattern: No Epileptiform activity: Yes; rare isolated right frontal sharp waves Electrographic seizures: no Events: no   Breach rhythm: no  Reactivity: Present  Stimulation procedures:  Hyperventilation: not done Photic stimulation: not done  Sleep Background: Stage II  EKG:no significant arrhythmia  Impression: This was an abnormal continuous video EEG due to right frontal focal slowing with some intermixed sharp waves, indicative of a focal cerebral lesion with epileptogenic  potential in that region. No further seizures were seen.

## 2021-07-12 NOTE — Progress Notes (Signed)
vLTM discontinued  [facility] notified  no skin breakdown noted at  [place]

## 2021-07-12 NOTE — Progress Notes (Signed)
PROGRESS NOTE    MAJD TISSUE  QIO:[id number] DOB: 11/03/1940 DOA: 07/23/2021 PCP: Seward Carol, MD    No chief complaint on file.   Brief Narrative:   Patient is 81 years old male with past medical history of right frontal subdural hematomas status post surgical evacuation in 2012, lung cancer status post lobectomy, alcohol abuse, CKD presented to hospital after sustaining a right femoral neck fracture and acetabular fracture.  Patient underwent right hip total arthroplasty on 07/10/2021.   patient was found to be responsive only to painful stimulus and was lethargic and was noted to have seizure-like activity around 5:30 AM on 1/13 . Medical team was consulted for lethargy and concerns for seizure that lasted for around 1 minute.  As well patient work-up was significant for anemia, encephalopathy, B12 deficiency, [facility].   Assessment & Plan:   Principal Problem:   Closed displaced fracture of right femoral neck (HCC) Active Problems:   Acute on chronic blood loss anemia   Displaced fracture of right femoral neck (HCC)   Protein-calorie malnutrition, severe   Pressure injury of skin     Toxic metabolic encephalopathy -Multifactorial, likely postictal, as well due to subdural hematoma, with known history of chronic alcoholism and heavy alcohol abuse -Patient is improved, he is more awake today, but remains altered. -He is on IV thiamine, with history of heavy alcohol abuse, as well L24 and folic supplements.   -Remains encephalopathic, will request cortrak tube and tube feed.    Seizures -He is currently on LTM EEG, possibly related to brain bleed/history of brain trauma, as well possible alcohol withdrawals -Management per neurology.  acute on chronic subdural hematoma.   - Patient does have history of subdural hematoma status postevacuation 2012. -Currently appears to be related to his recent fall. -  hold aspirin and NSAIDs. -Neurology on  board   Close displaced fracture of the right femoral neck.   - Status post surgical intervention.  Per orthopedic.  Dressing appears to have changed yesterday, no evidence of oozing or bleeding through dressing today.   History of recurrent falls in the past.  Will likely need skilled nursing facility placement   Acute on chronic Blood loss anemia. -Transfused units PRBC yesterday, since H&H has been stable, continue to monitor CBC daily.   -M01 and folic acid is borderline low, started on IM supplements.   Severe protein calorie malnutrition.   -He remains encephalopathic, will request cortrak tube insertion and to start tube feed, will monitor phosphorus level closely to avoid refeeding syndrome   Borderline hyperkalemia.   - monitor    CKD stage III A.   -Creatinine 1.5 today, continue to monitor closely.  Hyperkalemia -Resolved  Hypernatremia -Started on D5 half-normal saline  Failure to thrive/severe protein calorie malnutrition -Patient is extremely cachectic, BMI of 16.6, albumin of 1.5, severely malnutrition, blood glucose today 60, he is with hyponatremia as well, changed fluid, who request cortra k to start tube feeding by tomorrow  -Is extremely frail, deconditioned, with history of alcohol abuse, multiple falls, subdural hematoma, I have informed daughter his condition is tenuous/critical  Pressure ulcer -Continue with wound care. Pressure Injury 07/10/21 Sacrum Mid Stage 2 -  Partial thickness loss of dermis presenting as a shallow open injury with a red, pink wound bed without slough. 3 small 1X1 open areas (Active)  07/10/21 1700  Location: Sacrum  Location Orientation: Mid  Staging: Stage 2 -  Partial thickness loss of dermis  presenting as a shallow open injury with a red, pink wound bed without slough.  Wound Description (Comments): 3 small 1X1 open areas  Present on Admission:        DVT prophylaxis: SCD Code Status: FU:: Family Communication: Discussed  with daughter at bedside 07/11/2021 Disposition:   Status is: Inpatient  Remains inpatient appropriate because: seizures, anemia       Consultants:  Orthopedic Neurology  Subjective:   no significant events overnight as discussed with staff, patient himself unable to provide any complaints.  Objective: Vitals:   07/11/21 2039 07/11/21 2349 07/12/21 0347 07/12/21 0728  BP: 129/68 140/75 (!) 143/91 134/71  Pulse: 76 87 86 83  Resp: (!) 25 (!) 24 16 16   Temp: 98 F (36.7 C) (!) 97.4 F (36.3 C) 97.6 F (36.4 C)   TempSrc: Oral Oral Oral   SpO2: 100% 100% 100% 100%  Weight:      Height:        Intake/Output Summary (Last 24 hours) at 07/12/2021 1109 Last data filed at 07/12/2021 0541 Gross per 24 hour  Intake 1311.66 ml  Output 1900 ml  Net -588.34 ml   Filed Weights   07/04/2021 1700 07/16/2021 1129  Weight: 45.5 kg 45.5 kg    Examination:  Patient is more awake today, able to keep his eyes open, but he remains significantly altered, unable to answer any questions or follow any commands, connected to LTM EEG.  Trembly frail, thin appearing, cachectic  Symmetrical Chest wall movement, Good air movement bilaterally, CTAB RRR,No Gallops,Rubs or new Murmurs, No Parasternal Heave +ve B.Sounds, Abd Soft, No tenderness, No rebound - guarding or rigidity. No Cyanosis, Clubbing or edema, right hip surgical wound has been changed, absorbent gauze currently with no evidence of any blood.    Data Reviewed: I have personally reviewed following labs and imaging studies  CBC: Recent Labs  Lab 07/23/2021 0833 07/20/2021 0658 07/10/21 0325 07/11/21 0744 07/11/21 1835 07/12/21 0135  WBC 8.1 7.7 11.6*  --  11.2* 11.3*  HGB 7.5* 10.8* 7.2* 6.0* 8.9* 9.7*  HCT 23.9* 32.3* 21.8* 18.3* 26.2* 28.7*  MCV 91.2 84.6 86.9  --  87.6 88.0  PLT 562* 505* 313  --  280 295    Basic Metabolic Panel: Recent Labs  Lab 07/10/2021 0833 07/24/2021 0658 07/10/21 0325 07/11/21 0744  07/12/21 0135  NA 135 135 137 145 146*  K 4.9 5.0 5.4* 4.2 4.5  CL 106 105 105 118* 119*  CO2 20* 22 18* 17* 15*  GLUCOSE 111* 84 134* 81 60*  BUN 99* 81* 73* 54* 45*  CREATININE 1.46* 1.32* 1.65* 1.59* 1.51*  CALCIUM 8.9 8.5* 7.6* 7.1* 7.7*  MG  --   --  1.9  --   --     GFR: Estimated Creatinine Clearance: 25.1 mL/min (A) (by C-G formula based on SCr of 1.51 mg/dL (H)).  Liver Function Tests: Recent Labs  Lab 07/07/2021 0833 07/26/2021 0658 07/11/21 0632 07/11/21 0744  AST 35 30 23 22   ALT 24 19 8 8   ALKPHOS 172* 167* 100 101  BILITOT 0.3 0.8 0.5 0.5  PROT 7.8 7.7 5.1* 5.2*  ALBUMIN 2.6* 2.3* 1.5* 1.5*    CBG: Recent Labs  Lab 07/07/2021 1706 07/26/2021 2213 07/10/21 0308  GLUCAP 85 160* 135*     Recent Results (from the past 240 hour(s))  Surgical pcr screen     Status: Abnormal   Collection Time: 07/18/2021  8:33 AM   Specimen: Nasal Mucosa;  Nasal Swab  Result Value Ref Range Status   MRSA, PCR NEGATIVE NEGATIVE Final   Staphylococcus aureus POSITIVE (A) NEGATIVE Final    Comment: (NOTE) The Xpert SA Assay (FDA approved for NASAL specimens in patients 75 years of age and older), is one component of a comprehensive surveillance program. It is not intended to diagnose infection nor to guide or monitor treatment. Performed at [facility], [place] [address] [place], [place] [address]   SARS CORONAVIRUS 2 (TAT 6-24 HRS) Nasopharyngeal Nasopharyngeal Swab     Status: None   Collection Time: 07/01/2021  8:33 AM   Specimen: Nasopharyngeal Swab  Result Value Ref Range Status   SARS Coronavirus 2 NEGATIVE NEGATIVE Final    Comment: (NOTE) SARS-CoV-2 target nucleic acids are NOT DETECTED.  The SARS-CoV-2 RNA is generally detectable in upper and lower respiratory specimens during the acute phase of infection. Negative results do not preclude SARS-CoV-2 infection, do not rule out co-infections with other pathogens, and should not be used as the sole  basis for treatment or other patient management decisions. Negative results must be combined with clinical observations, patient history, and epidemiological information. The expected result is Negative.  Fact Sheet for Patients: SugarRoll.be  Fact Sheet for Healthcare Providers: https://www.woods-mathews.com/  This test is not yet approved or cleared by the [place] FDA and  has been authorized for detection and/or diagnosis of SARS-CoV-2 by FDA under an Emergency Use Authorization (EUA). This EUA will remain  in effect (meaning this test can be used) for the duration of the COVID-19 declaration under Se ction 564(b)(1) of the Act, 21 U.S.C. section 360bbb-3(b)(1), unless the authorization is terminated or revoked sooner.  Performed at [facility], [place] [address] [place], [place] [address]          Radiology Studies: EEG adult  Result Date: August 01, 2021 Lora Havens, MD     2021-08-01  5:25 PM Patient Name: ELLERY MERONEY MRN: [id number] Epilepsy Attending: Lora Havens Referring Physician/Provider: Lennie Hummer, PA-C Date: 08/01/21 Duration: 29.11 mins Patient history: 81yo m with right SDH, now with seizure like activity. EEG to evaluate for seizure Level of alertness: lethargic AEDs during EEG study: LEV Technical aspects: This EEG study was done with scalp electrodes positioned according to the 10-20 International system of electrode placement. Electrical activity was acquired at a sampling rate of 500Hz  and reviewed with a high frequency filter of 70Hz  and a low frequency filter of 1Hz . EEG data were recorded continuously and digitally stored. Description: No posterior dominant rhythm was seen. EEG showed continuous low amplitude 3 to 6 Hz theta-delta slowing In left hemisphere. EEG also showed 3-5hz  theta-delta slowing in right hemisphere which is sharply contoured and rhythmic. There  is also intermittent 15-16hz  beta  activity in right hemisphere. Hyperventilation and photic stimulation were not performed.   ABNORMALITY - Lateralized rhythmic delta activity, right hemisphere ((LRDA) - Continuous slow, generalized IMPRESSION: This study is suggestive of cortical dysfunction arising from right hemisphere likely secondary to underlying SDH. The rhythmic delta activity in right hemisphere ( LRDA) is on the ictal-interictal continuum with low to intermediate potential for seizure. There is also moderate diffuse encephalopathy, nonspecific etiology. No definite seizures or epileptiform discharges were seen throughout the recording. If suspicion for interictal activity remains a concern, a prolonged study should be considered. Priyanka Barbra Sarks   Overnight EEG with video  Result Date: August 01, 2021 Lora Havens, MD     07/11/2021  9:45 AM  Patient Name: KOBEY SIDES MRN: [id number] Epilepsy Attending: Lora Havens Referring Physician/Provider: Dr Amie [place] Duration: 07/10/2021 2018 to 07/11/2021 0730  Patient history: 81yo m with right SDH, now with seizure like activity. EEG to evaluate for seizure  Level of alertness: lethargic  AEDs during EEG study: LEV, phenobarb  Technical aspects: This EEG study was done with scalp electrodes positioned according to the 10-20 International system of electrode placement. Electrical activity was acquired at a sampling rate of 500Hz  and reviewed with a high frequency filter of 70Hz  and a low frequency filter of 1Hz . EEG data were recorded continuously and digitally stored.  Description: No posterior dominant rhythm was seen. EEG showed continuous low amplitude 3 to 6 Hz theta-delta slowing In left hemisphere.  EEG also showed sharp waves in right frontal region which appeared quasi periodic at 1hz  and rhythmic admixed with 3-5hz  theta-delta slowing with evolution in morphology and frequency. 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 Phenobarbital load was give at around 0200 on 07/11/2021 after which eeg improved. EEG then showed polymorphic mixed frequencies in right hemisphere with predominantly 3-6hz  theta-delta slowing admixed with 15-18Hz  beta activity. Abundant sharp waves were also noted in right hemisphere, maximal right frontal region which were qasi periodic at 0.5-1hz .  Hyperventilation and photic stimulation were not performed.    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 Phenobarbital load was give at around 0200 on 07/11/2021 after which eeg improved and showed evidence of epileptogenicity and cortical dysfunction in right hemisphere, maximal right frontal region with high potential for seizure recurrence. There was also moderate diffuse encephalopathy, nonspecific etiology but likely due to seizure. Dr Rory Percy was notified.  Priyanka Barbra Sarks         Scheduled Meds:  Chlorhexidine Gluconate Cloth  6 each Topical Daily   cyanocobalamin  1,000 mcg Intramuscular Q0600   docusate sodium  100 mg Oral BID   folic acid  1 mg Oral Daily   LORazepam  2 mg Intravenous Once   multivitamin with minerals  1 tablet Oral Daily   pantoprazole (PROTONIX) IV  40 mg Intravenous Q12H   PHENObarbital  65 mg Intravenous BID   senna  1 tablet Oral BID   [START ON 07/14/2021] thiamine  100 mg Oral Daily   Or   [START ON 07/14/2021] thiamine  100 mg Intravenous Daily   Continuous Infusions:  sodium chloride 75 mL/hr at 07/12/21 1032   levETIRAcetam 250 mg (07/12/21 1039)   methocarbamol (ROBAXIN) IV     thiamine injection 500 mg (07/12/21 1048)     LOS: 4 days      Phillips Climes, MD [facility]   To contact the attending  provider between 7A-7P or the covering provider during after hours 7P-7A, please log into the web site [email or web address] and access using universal [place] password for that web site. If you do not have the password, please call the hospital operator.  07/12/2021, 11:09 AM   Patient ID: Lorenda Hatchet, male   DOB: Sep 11, 1940, 81 y.o.   MRN: [id number] Patient ID: LORA GLOMSKI, male   DOB: Jul 21, 1940, 81 y.o.   MRN: [id number]

## 2021-07-13 ENCOUNTER — Inpatient Hospital Stay (HOSPITAL_COMMUNITY): Payer: Medicare HMO

## 2021-07-13 LAB — GLUCOSE, CAPILLARY
Glucose-Capillary: 92 mg/dL (ref 70–99)
Glucose-Capillary: 101 mg/dL — ABNORMAL HIGH (ref 70–99)
Glucose-Capillary: 102 mg/dL — ABNORMAL HIGH (ref 70–99)
Glucose-Capillary: 138 mg/dL — ABNORMAL HIGH (ref 70–99)
Glucose-Capillary: 142 mg/dL — ABNORMAL HIGH (ref 70–99)
Glucose-Capillary: 123 mg/dL — ABNORMAL HIGH (ref 70–99)

## 2021-07-13 LAB — CBC
HCT: 29.3 % — ABNORMAL LOW (ref 39.0–52.0)
Hemoglobin: 9.5 g/dL — ABNORMAL LOW (ref 13.0–17.0)
MCH: 29.1 pg (ref 26.0–34.0)
MCHC: 32.4 g/dL (ref 30.0–36.0)
MCV: 89.6 fL (ref 80.0–100.0)
Platelets: 317 10*3/uL (ref 150–400)
RBC: 3.27 MIL/uL — ABNORMAL LOW (ref 4.22–5.81)
RDW: 17.2 % — ABNORMAL HIGH (ref 11.5–15.5)
WBC: 10.9 10*3/uL — ABNORMAL HIGH (ref 4.0–10.5)
nRBC: 0.3 % — ABNORMAL HIGH (ref 0.0–0.2)

## 2021-07-13 LAB — BASIC METABOLIC PANEL
Anion gap: 10 (ref 5–15)
BUN: 37 mg/dL — ABNORMAL HIGH (ref 8–23)
CO2: 16 mmol/L — ABNORMAL LOW (ref 22–32)
Calcium: 8.1 mg/dL — ABNORMAL LOW (ref 8.9–10.3)
Chloride: 121 mmol/L — ABNORMAL HIGH (ref 98–111)
Creatinine, Ser: 1.44 mg/dL — ABNORMAL HIGH (ref 0.61–1.24)
GFR, Estimated: 49 mL/min — ABNORMAL LOW (ref >60)
Glucose, Bld: 82 mg/dL (ref 70–99)
Potassium: 4.3 mmol/L (ref 3.5–5.1)
Sodium: 147 mmol/L — ABNORMAL HIGH (ref 135–145)

## 2021-07-13 LAB — SURGICAL PATHOLOGY

## 2021-07-13 LAB — PHOSPHORUS: Phosphorus: 3.3 mg/dL (ref 2.5–4.6)

## 2021-07-13 LAB — MAGNESIUM: Magnesium: 1.9 mg/dL (ref 1.7–2.4)

## 2021-07-13 IMAGING — CR DG CHEST 1V
2 series · 2 of 2 positions shown · non-contrast
Comparison: [DATE] and CT chest [DATE].

CLINICAL DATA: MRI screening.

EXAM:
CHEST  1 VIEW

[chest ap (1 of 2)]
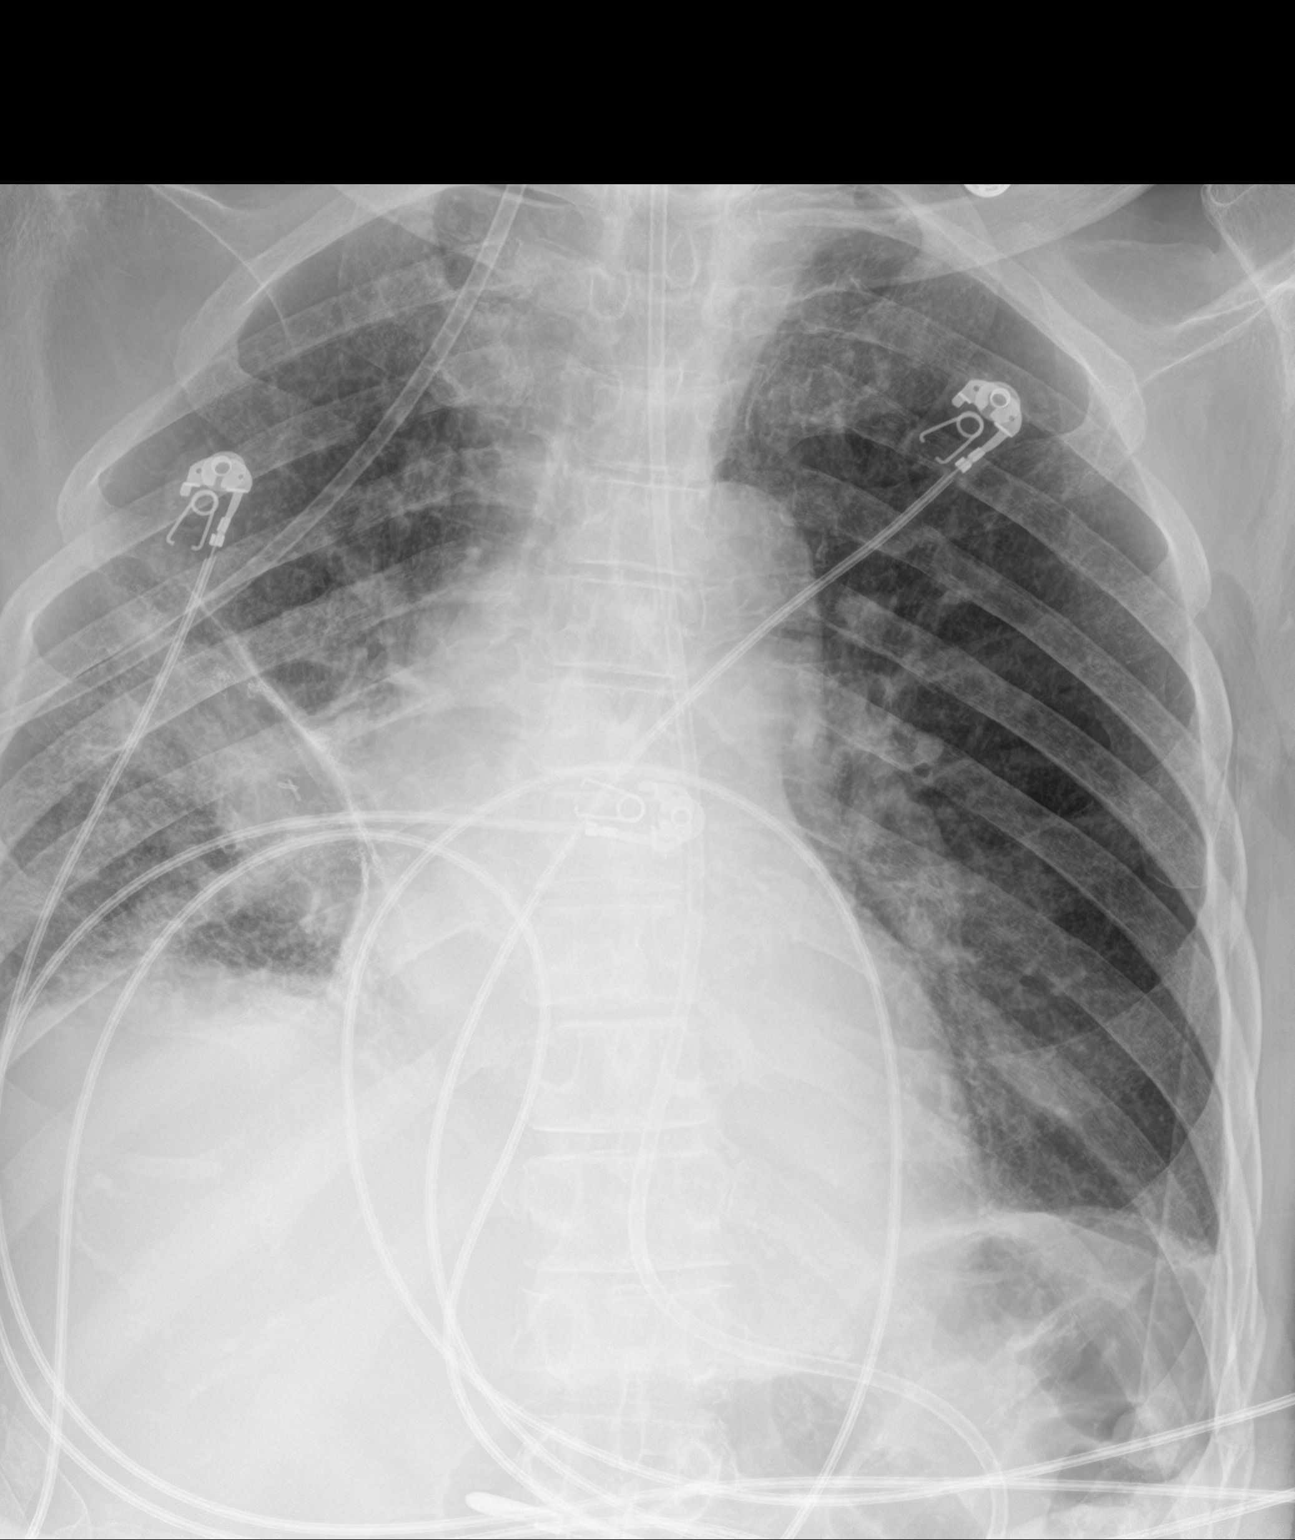

[chest ap (2 of 2)]
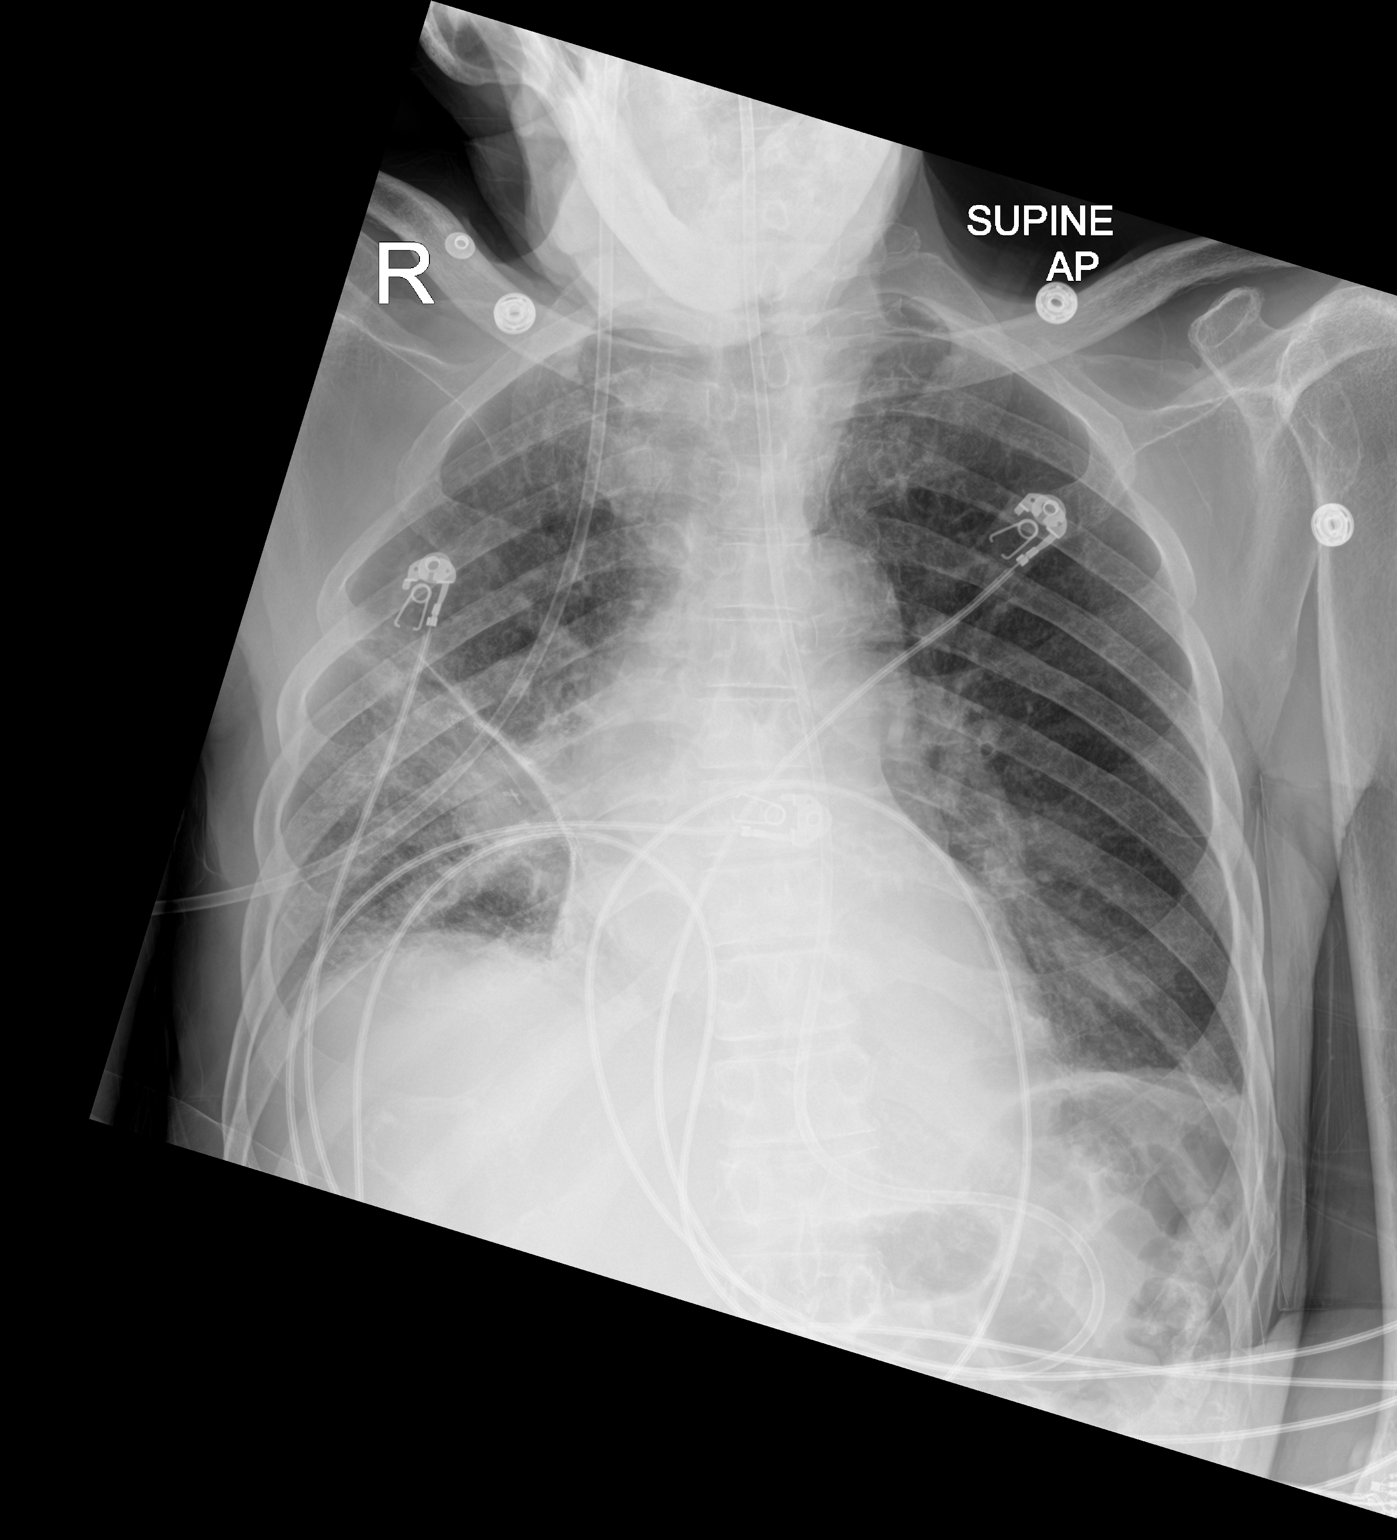

[2 of 2 positions shown; findings below may reference images not displayed]

FINDINGS: Trachea is midline. Feeding tube is followed into the distal
stomach. Heart size stable. Right infrahilar airspace consolidation
with surgical clips or fiducial markers. No additional radiopaque
foreign bodies surrounding hazy opacification in the right lung
base. Difficult to exclude small bilateral pleural effusions.
IMPRESSION: 1. Right perihilar masslike consolidation with surgical clips and/or
fiducial markers. Disease recurrence cannot be excluded. Consider CT
chest with contrast in further evaluation, as clinically indicated.
2. No additional radiopaque foreign bodies.

## 2021-07-13 IMAGING — MR MR HEAD W/O CM
9 of 12 series · 35 of 48 positions shown · non-contrast
Comparison: Head CT [DATE]

CLINICAL DATA: Altered mental status.  Subdural hematoma.

EXAM:
MRI HEAD WITHOUT CONTRAST
TECHNIQUE: Multiplanar, multiecho pulse sequences of the brain and surrounding
structures were obtained without intravenous contrast.

[Series 3: DWI · axial · 3.0mm · 1.09mm/px · z∈[-105,+53]mm · 8 of 110 slices shown (1 of 4)]
[im 1/110]
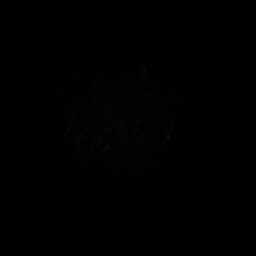
[im 16/110]
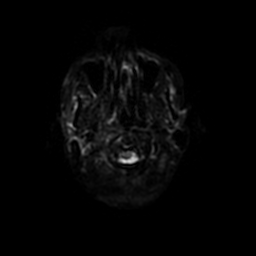
[im 32/110]
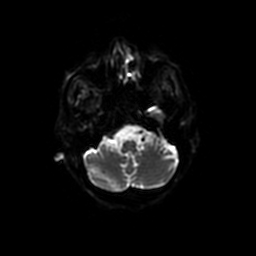
[im 47/110]
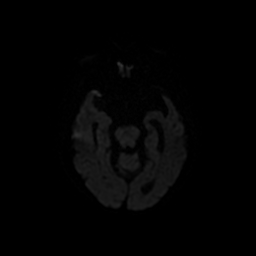
[im 63/110]
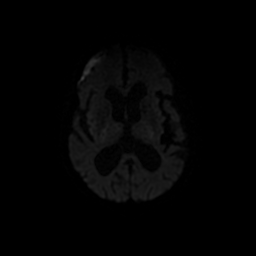
[im 78/110]
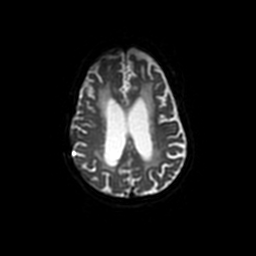
[im 94/110]
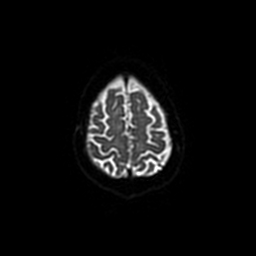
[im 110/110]
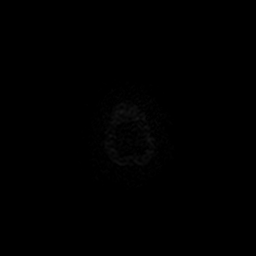

[Series 4: DWI · coronal · 5.0mm · 1.09mm/px · 7 of 80 slices shown (2 of 4)]
[im 1/80]
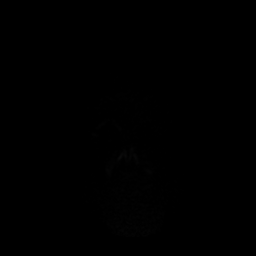
[im 14/80]
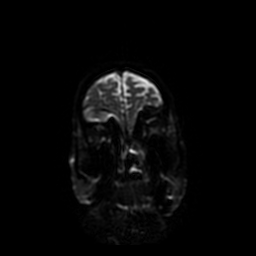
[im 27/80]
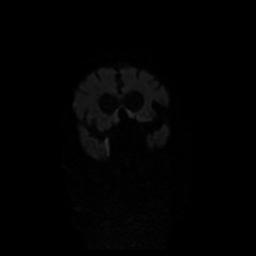
[im 40/80]
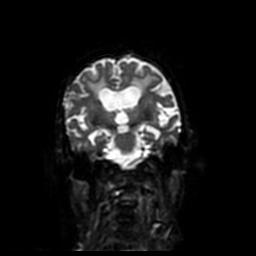
[im 53/80]
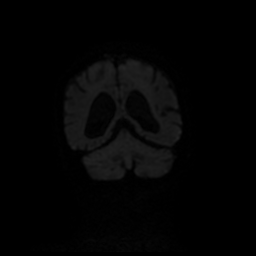
[im 66/80]
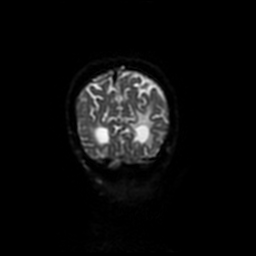
[im 80/80]
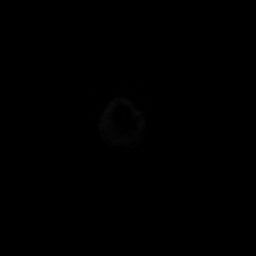

[Series 6: T2 · axial · 5.0mm · 0.43mm/px · z∈[-77,+71]mm · 2 of 26 slices shown (1 of 3)]
[im 1/26]
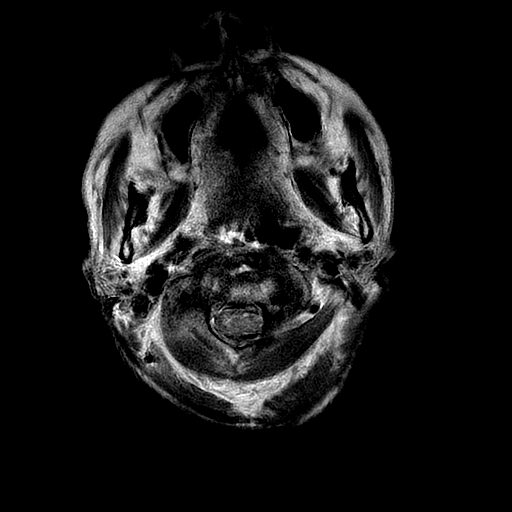
[im 26/26]
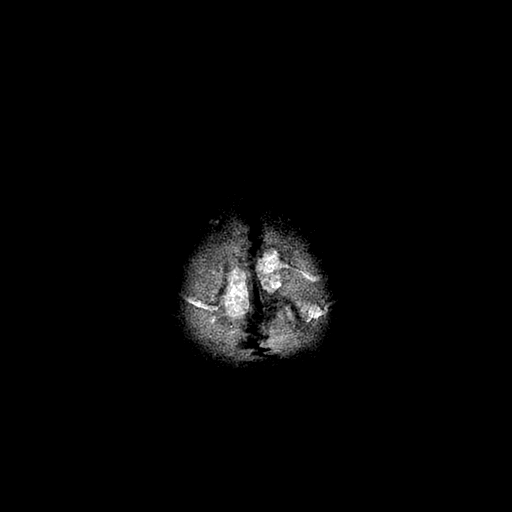

[Series 7: FLAIR · axial · 5.0mm · 0.43mm/px · z∈[-77,+71]mm · 2 of 26 slices shown (1 of 2)]
[im 1/26]
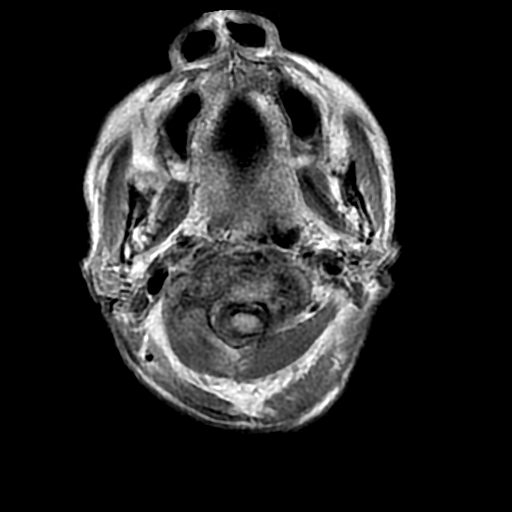
[im 26/26]
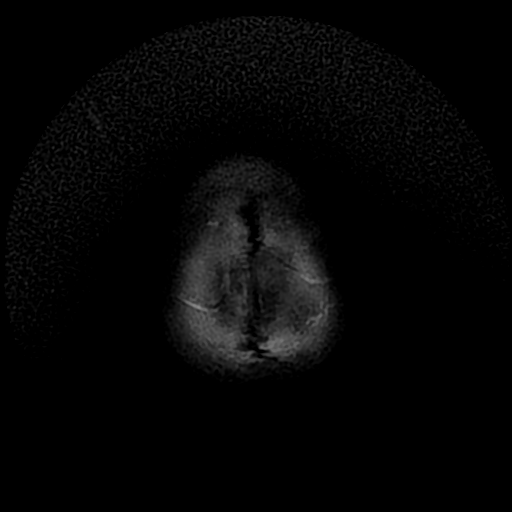

[Series 10: T2 · coronal · 5.0mm · 0.43mm/px · 2 of 24 slices shown (2 of 3)]
[im 1/24]
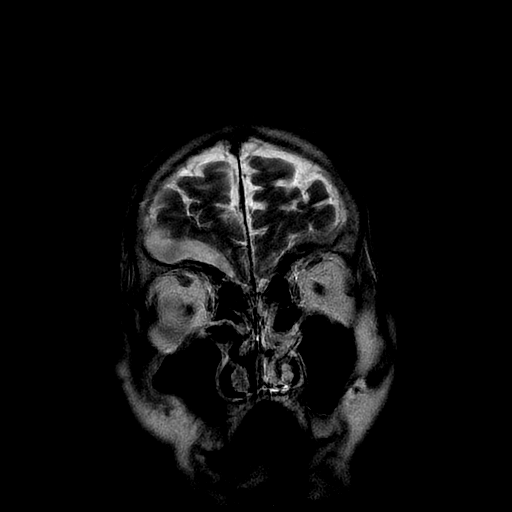
[im 24/24]
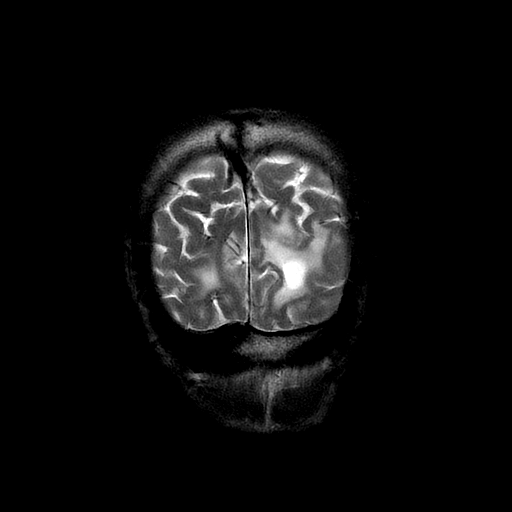

[Series 11: T2 · coronal · 3.0mm · 0.35mm/px · 3 of 35 slices shown (3 of 3)]
[im 1/35]
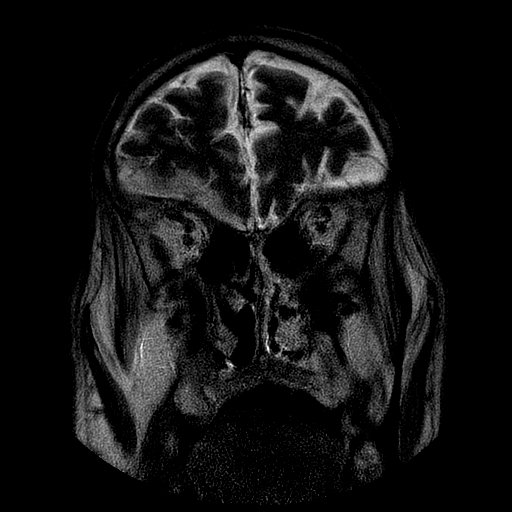
[im 18/35]
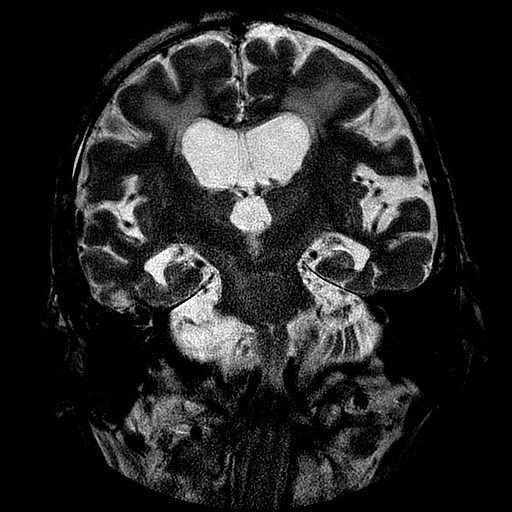
[im 35/35]
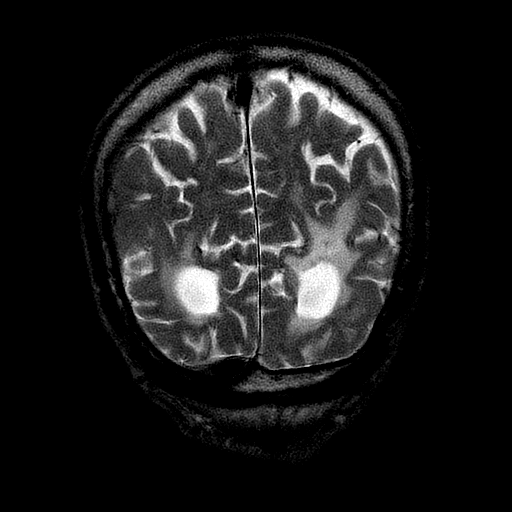

[Series 12: FLAIR · coronal · 3.0mm · 0.35mm/px · 3 of 35 slices shown (2 of 2)]
[im 1/35]
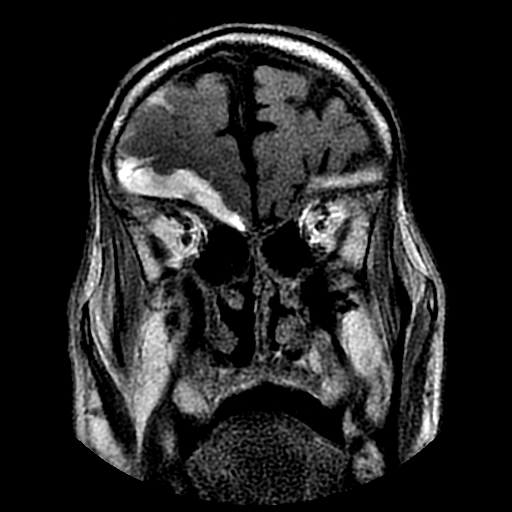
[im 18/35]
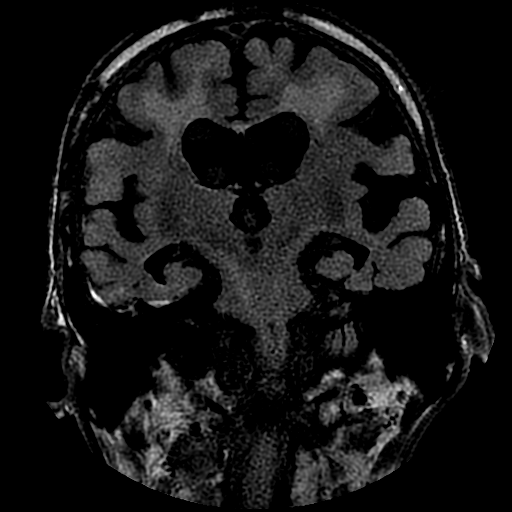
[im 35/35]
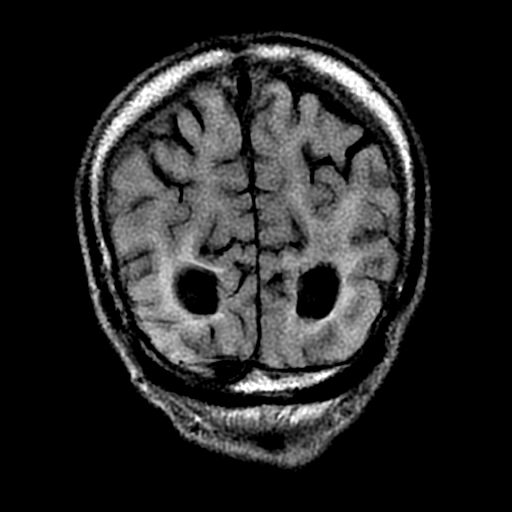

[Series 300: DWI · axial · 3.0mm · 1.09mm/px · z∈[-105,+53]mm · 5 of 55 slices shown (3 of 4)]
[im 1/55]
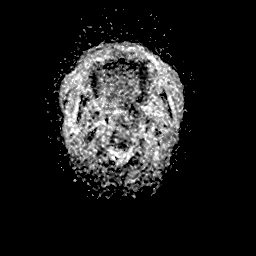
[im 14/55]
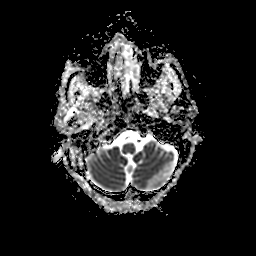
[im 28/55]
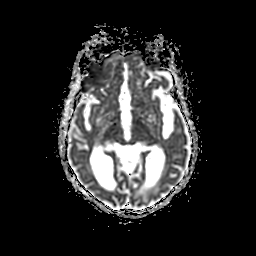
[im 41/55]
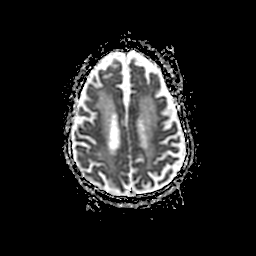
[im 55/55]
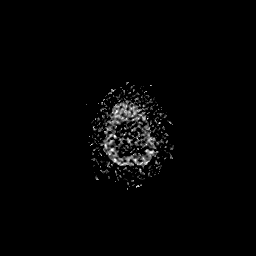

[Series 400: DWI · coronal · 5.0mm · 1.09mm/px · 3 of 40 slices shown (4 of 4)]
[im 1/40]
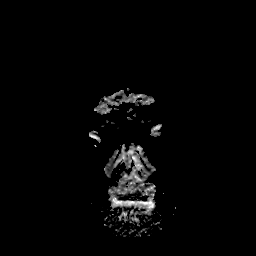
[im 20/40]
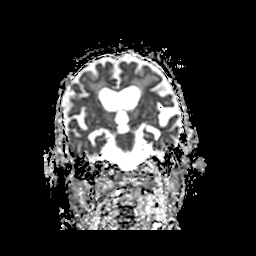
[im 40/40]
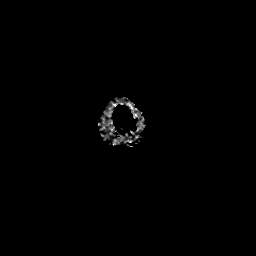

[35 of 48 positions shown; findings below may reference images not displayed]

FINDINGS: Brain: T1 hyperintense subdural hematomas over the anterior frontal
convexities extend inferiorly along the floor of the anterior
cranial fossa bilaterally and also extend into the middle cranial
fossa on the right. These subdural hematomas measure up to 8 mm in
thickness on the right and 5 mm on the left. There is very mild mass
effect on the anterior inferior right frontal lobe.

Scattered chronic microhemorrhages are noted in both cerebral
hemispheres, including in the deep gray nuclei, and in the brainstem
and cerebellum suggesting chronic hypertension. No acute infarct,
mass, or midline shift is evident. Confluent T2 hyperintensities in
the cerebral white matter and patchy T2 hyperintensities in the pons
are nonspecific but compatible with extensive chronic small vessel
ischemic disease with asymmetric involvement noted in the left
parieto-occipital region. There is advanced cerebral atrophy.

Vascular: Major intracranial vascular flow voids are preserved.

Skull and upper cervical spine: Lateral right frontal craniotomy.
Advanced C1-2 arthropathy.

Sinuses/Orbits: Unremarkable orbits. Small right mastoid effusion.
Largely clear paranasal sinuses.

Other: None.
IMPRESSION: 1. Small bilateral subdural hematomas. Slight mass effect on the
right frontal lobe. No midline shift.
2. Extensive chronic small vessel ischemic disease and cerebral
atrophy.

## 2021-07-13 IMAGING — CR DG ABDOMEN 1V
2 series · 2 of 2 positions shown · non-contrast
Comparison: None.

CLINICAL DATA: MRI screening.

EXAM:
ABDOMEN - 1 VIEW

[abdomen kub (1 of 2)]
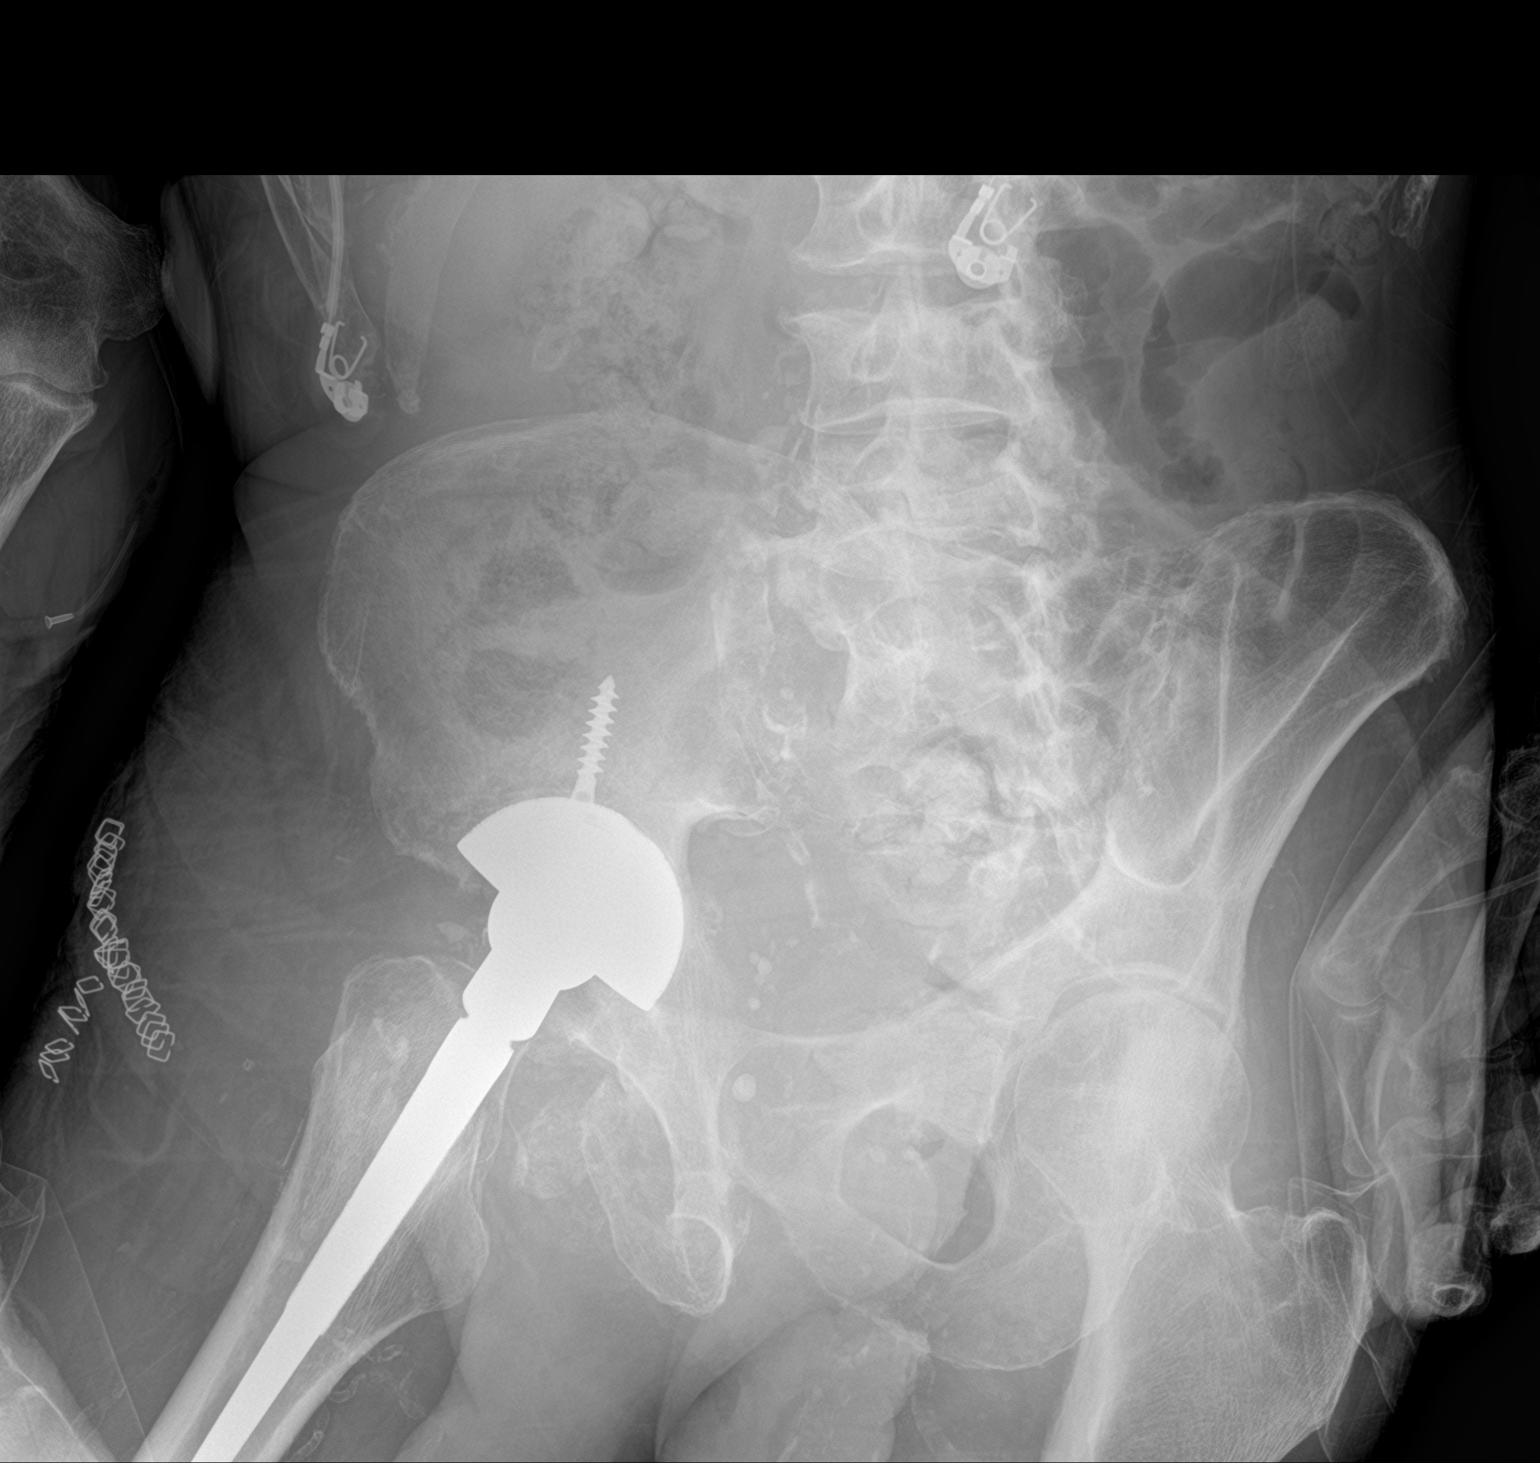

[abdomen kub (2 of 2)]
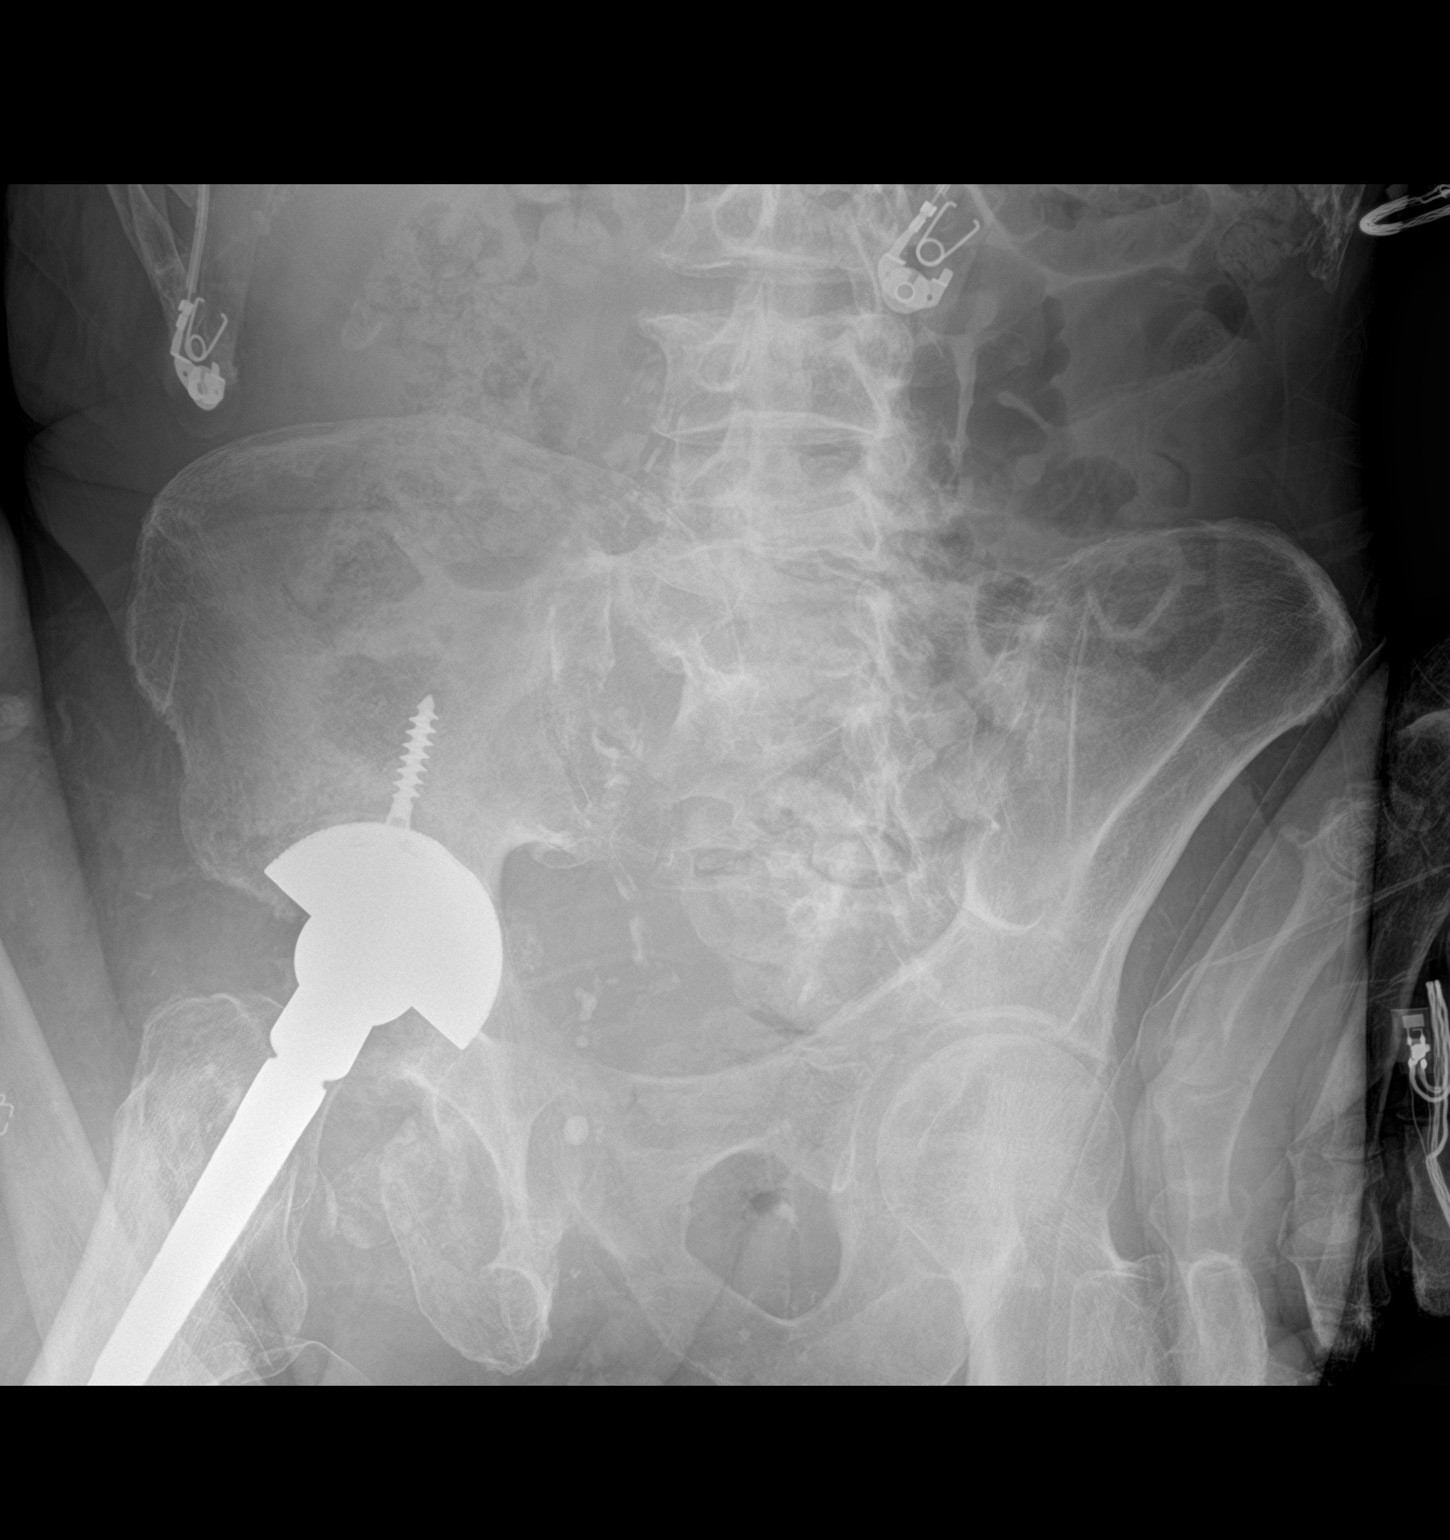

[2 of 2 positions shown; findings below may reference images not displayed]

FINDINGS: Right hip arthroplasty. Otherwise, no radiopaque foreign body.
Feeding tube terminates in the distal stomach. Stool is scattered in
the colon. A tiny screw is seen in the soft tissues the proximal
forearm.
IMPRESSION: Right hip arthroplasty. Otherwise, no unexpected radiopaque foreign
body in the visualized portions of the abdomen/pelvis.

## 2021-07-13 MED ORDER — DOCUSATE SODIUM 50 MG/5ML PO LIQD
100.0000 mg | Freq: Two times a day (BID) | ORAL | Status: DC
  Administered 2021-07-13 – 2021-07-14 (×2): 100 mg
  Filled 2021-07-13 (×2): qty 10

## 2021-07-13 MED ORDER — LORAZEPAM 1 MG PO TABS: 1.0000 mg | ORAL_TABLET | ORAL | Status: DC | PRN

## 2021-07-13 MED ORDER — LORAZEPAM 2 MG/ML IJ SOLN
1.0000 mg | INTRAMUSCULAR | Status: DC | PRN
  Administered 2021-07-14: 2 mg via INTRAVENOUS
  Filled 2021-07-13: qty 1

## 2021-07-13 NOTE — Progress Notes (Signed)
? ?  Inpatient Rehab Admissions Coordinator : ? ?Per therapy recommendations, patient was screened for CIR candidacy by Lissette Schenk RN MSN.  At this time patient appears to be a potential candidate for CIR. I will place a rehab consult per protocol for full assessment. Please call me with any questions. ? ?Egypt Marchiano RN MSN ?Admissions Coordinator ?[phone] ?  ?

## 2021-07-13 NOTE — Progress Notes (Signed)
Inpatient Rehab Admissions Coordinator:   I spoke with pt.'s daughter regarding potential CIR admit. She is interested and can provide 24/7 support at d/c. I will open a case with Pt.'s insurance (await OT eval) and pursue for potential CIR admit.  Clemens Catholic, [place], [place] Admissions Coordinator  [phone] ([place]) [phone] (office)

## 2021-07-13 NOTE — Procedures (Signed)
Cortrak  Person Inserting Tube:  Maylon Peppers C, RD Tube Type:  Cortrak - 43 inches Tube Size:  10 Tube Location:  Left nare Initial Placement:  Stomach Secured by: Bridle Technique Used to Measure Tube Placement:  Marking at nare/corner of mouth Cortrak Secured At:  66 cm  Cortrak Tube Team Note:  Consult received to place a Cortrak feeding tube.   X-ray is required, abdominal x-ray has been ordered by the Cortrak team. Please confirm tube placement before using the Cortrak tube.   If the tube becomes dislodged please keep the tube and contact the Cortrak team at [email or web address] (password [email or web address]) for replacement.  If after hours and replacement cannot be delayed, place a NG tube and confirm placement with an abdominal x-ray.    Lockie Pares., RD, LDN, CNSC See AMiON for contact information

## 2021-07-13 NOTE — Consult Note (Addendum)
[place] Nurse Consult Note: Reason for Consult: Consult requested for sacrum.  Pt has 4 areas of Stage 2 pressure injuries to upper buttocks/sacrum area; each is approx .2X.2X.1 cm, pink and dry. These were noted as present on admission in the nursing flow sheet.  Posterior scrotum with Stage 2 pressure injury; .3X.3X.1cm, pink and moist. This was not noted as present on admission. Dressing procedure/placement/frequency: Topical treatment orders provided for bedside nurses to perform as follows to protect and promote healing: Apply barrier cream to posterior scrotum with each turning and cleaning episode. Foam dressing to sacrum, change Q 3 days or PRN soiling. Please re-consult if further assistance is needed.  Thank-you,  Julien Girt MSN, [place], [place], [place], [place]

## 2021-07-13 NOTE — Progress Notes (Signed)
°  Transition of Care [facility]) Screening Note   Patient Details  Name: Gary Stevenson Date of Birth: 01/24/1941   [facility] of [facility]) CM/SW Contact:    Pollie Friar, RN Phone Number: 07/13/2021, 1:43 PM    Transition of Care Department [facility]) has reviewed patient. We will continue to monitor patient advancement through interdisciplinary progression rounds. If new patient transition needs arise, please place a TOC consult.

## 2021-07-13 NOTE — Progress Notes (Signed)
PT Cancellation Note  Patient Details Name: Gary Stevenson MRN: [id number] DOB: 1940/10/13   Cancelled Treatment:    Reason Eval/Treat Not Completed: Fatigue/lethargy limiting ability to participate.  Pt is in bed and too weak for therapy today, will retry as time and pt allow.   Ramond Dial 07/13/2021, 4:44 PM   Mee Hives, PT PhD Acute Rehab Dept. Number: [place] and [place]

## 2021-07-13 NOTE — Progress Notes (Signed)
PROGRESS NOTE    Gary Stevenson  RJJ:[id number] DOB: 1940/11/30 DOA: 07/24/2021 PCP: Seward Carol, MD    No chief complaint on file.   Brief Narrative:   Patient is 81 years old male with past medical history of right frontal subdural hematomas status post surgical evacuation in 2012, lung cancer status post lobectomy, alcohol abuse, CKD presented to hospital after sustaining a right femoral neck fracture and acetabular fracture.  Patient underwent right hip total arthroplasty on 07/17/2021.   patient was found to be responsive only to painful stimulus and was lethargic and was noted to have seizure-like activity around 5:30 AM on 1/13 . Medical team was consulted for lethargy and concerns for seizure that lasted for around 1 minute.  As well patient work-up was significant for anemia, encephalopathy, B12 deficiency, he transferred to [facility] 07[facility]11[facility].  -His work-up was significant for seizures, where he was kept on LTM EEG, or seizures has resolved with AEDs, he remains encephalopathic, but this has gradually improved, as well he had acute blood loss anemia where he required multiple PRBC transfusions, where his CBC has stabilized over the last couple days.  Assessment & Plan:   Principal Problem:   Closed displaced fracture of right femoral neck (HCC) Active Problems:   Acute on chronic blood loss anemia   Displaced fracture of right femoral neck (HCC)   Protein-calorie malnutrition, severe   Pressure injury of skin     Toxic metabolic encephalopathy -Multifactorial, likely postictal, as well due to subdural hematoma, with known history of chronic alcoholism and heavy alcohol abuse -He is on IV thiamine, with history of heavy alcohol abuse, as well K16 and folic supplements.   -MRI pending per neuro recommendations -Encephalopathy continues to improve, but remains significantly altered and confused, will request cortrk insertion for tube feed today.  Seizures - LTM EEG done ,  possibly related to brain bleed/history of brain trauma, as well possible alcohol withdrawals -No further seizures. -Management per neurology.  acute on chronic subdural hematoma.   - Patient does have history of subdural hematoma status postevacuation 2012. -Currently appears to be related to his recent fall. -  hold aspirin and NSAIDs. -Neurology on board -Follow on MRI brain today.   Close displaced fracture of the right femoral neck.   - Status post surgical intervention.  Per orthopedic.  Dressing appears to have changed yesterday, no evidence of oozing or bleeding through dressing today.   History of recurrent falls in the past.  Will likely need skilled nursing facility placement   Acute on chronic Blood loss anemia. -Transfused units PRBC yesterday, since H&H has been stable, continue to monitor CBC daily.   -W10 and folic acid is borderline low, started on IM supplements. -CBC closely for last 2 days.   Severe protein calorie malnutrition.   -Patient has improved, but he remains encephalopathic, likely will pass full evaluation in 1 to 2 days, but I will doubt he will have enough oral intake for first couple days, so we will insert cortrak and start on tube feed . - will monitor phosphorus level closely to avoid refeeding syndrome   Borderline hyperkalemia.   - monitor    CKD stage III A.   -Creatinine 1.5 today, continue to monitor closely.  Hyperkalemia -Resolved  Hypernatremia -Started on D5 half-normal saline, will keep on free water once NGT inserted.  Failure to thrive/severe protein calorie malnutrition -Patient is extremely cachectic, BMI of 16.6, albumin of 1.5, severely malnutrition, blood glucose today  47, he is with hyponatremia as well, changed fluid, who request cortra k to start tube feeding by tomorrow  -Is extremely frail, deconditioned, with history of alcohol abuse, multiple falls, subdural hematoma, I have informed daughter his condition is  tenuous/critical  Pressure ulcer -Continue with wound care. Pressure Injury 07/10/21 Sacrum Mid Stage 2 -  Partial thickness loss of dermis presenting as a shallow open injury with a red, pink wound bed without slough. 4 small open areas (Active)  07/10/21 1700  Location: Sacrum  Location Orientation: Mid  Staging: Stage 2 -  Partial thickness loss of dermis presenting as a shallow open injury with a red, pink wound bed without slough.  Wound Description (Comments): 4 small open areas  Present on Admission: Yes     Pressure Injury 07/13/21 Scrotum Posterior Stage 2 -  Partial thickness loss of dermis presenting as a shallow open injury with a red, pink wound bed without slough. (Active)  07/13/21   Location: Scrotum  Location Orientation: Posterior  Staging: Stage 2 -  Partial thickness loss of dermis presenting as a shallow open injury with a red, pink wound bed without slough.  Wound Description (Comments):   Present on Admission: No       DVT prophylaxis: SCD Code Status: FU:: Family Communication: Discussed with daughter at bedside 07/11/2021  Disposition:   Status is: Inpatient  Remains inpatient appropriate because: seizures, anemia       Consultants:  Orthopedic Neurology  Subjective:   no significant events overnight as discussed with staff, patient himself unable to provide any complaints.  Objective: Vitals:   07/12/21 2008 07/13/21 0004 07/13/21 0431 07/13/21 0736  BP: 138/71 (!) 158/71 (!) 174/83 (!) 157/79  Pulse: 75 81 88 88  Resp: 19 19 16 20   Temp: 97.9 F (36.6 C) 97.8 F (36.6 C) 98.1 F (36.7 C) 98.6 F (37 C)  TempSrc: Oral Oral Oral Oral  SpO2: 100% 100% 100% 100%  Weight:      Height:        Intake/Output Summary (Last 24 hours) at 07/13/2021 1115 Last data filed at 07/13/2021 0300 Gross per 24 hour  Intake 1232.62 ml  Output --  Net 1232.62 ml   Filed Weights   07/23/2021 1700 07/17/2021 1129  Weight: 45.5 kg 45.5 kg     Examination:  Patient is extremely frail, cachectic, malnourished, he is more awake and interactive today, more appropriate, but remains significantly altered, unable to answer any questions appropriately  Symmetrical Chest wall movement, fair air entry bilaterally, mildly diminished at the bases RRR,No Gallops,Rubs or new Murmurs, No Parasternal Heave +ve B.Sounds, Abd Soft, scaphoid abdomen No Cyanosis, Clubbing or edema, No new Rash or bruise     Data Reviewed: I have personally reviewed following labs and imaging studies  CBC: Recent Labs  Lab 07/10/21 0325 07/11/21 0248 07/11/21 0744 07/11/21 1835 07/12/21 0135 07/13/21 0019  WBC 11.6* 10.1  --  11.2* 11.3* 10.9*  HGB 7.2* 5.6* 6.0* 8.9* 9.7* 9.5*  HCT 21.8* 16.9* 18.3* 26.2* 28.7* 29.3*  MCV 86.9 86.2  --  87.6 88.0 89.6  PLT 313 298  --  280 307 975    Basic Metabolic Panel: Recent Labs  Lab 07/10/21 0325 07/11/21 0248 07/11/21 0744 07/12/21 0135 07/12/21 1118 07/13/21 0019  NA 137 144 145 146*  --  147*  K 5.4* 4.2 4.2 4.5  --  4.3  CL 105 117* 118* 119*  --  121*  CO2 18* 18* 17*  15*  --  16*  GLUCOSE 134* 77 81 60*  --  82  BUN 73* 56* 54* 45*  --  37*  CREATININE 1.65* 1.64* 1.59* 1.51*  --  1.44*  CALCIUM 7.6* 7.2* 7.1* 7.7*  --  8.1*  MG 1.9 1.9  --   --   --  1.9  PHOS  --  4.0  --   --  3.8 3.3    GFR: Estimated Creatinine Clearance: 26.3 mL/min (A) (by C-G formula based on SCr of 1.44 mg/dL (H)).  Liver Function Tests: Recent Labs  Lab 06/29/2021 0833 06/30/2021 0658 07/11/21 0248 07/11/21 0632 07/11/21 0744  AST 35 30 23 23 22   ALT 24 19 8 8 8   ALKPHOS 172* 167* 97 100 101  BILITOT 0.3 0.8 0.4 0.5 0.5  PROT 7.8 7.7 5.1* 5.1* 5.2*  ALBUMIN 2.6* 2.3* 1.5* 1.5* 1.5*    CBG: Recent Labs  Lab 07/12/21 2050 07/12/21 2207 07/13/21 0140 07/13/21 0509 07/13/21 0920  GLUCAP 61* 76 92 101* 102*     Recent Results (from the past 240 hour(s))  Surgical pcr screen     Status:  Abnormal   Collection Time: 07/18/2021  8:33 AM   Specimen: Nasal Mucosa; Nasal Swab  Result Value Ref Range Status   MRSA, PCR NEGATIVE NEGATIVE Final   Staphylococcus aureus POSITIVE (A) NEGATIVE Final    Comment: (NOTE) The Xpert SA Assay (FDA approved for NASAL specimens in patients 10 years of age and older), is one component of a comprehensive surveillance program. It is not intended to diagnose infection nor to guide or monitor treatment. Performed at [facility], [place] [address] [place], [place] [address]   SARS CORONAVIRUS 2 (TAT 6-24 HRS) Nasopharyngeal Nasopharyngeal Swab     Status: None   Collection Time: 06/30/2021  8:33 AM   Specimen: Nasopharyngeal Swab  Result Value Ref Range Status   SARS Coronavirus 2 NEGATIVE NEGATIVE Final    Comment: (NOTE) SARS-CoV-2 target nucleic acids are NOT DETECTED.  The SARS-CoV-2 RNA is generally detectable in upper and lower respiratory specimens during the acute phase of infection. Negative results do not preclude SARS-CoV-2 infection, do not rule out co-infections with other pathogens, and should not be used as the sole basis for treatment or other patient management decisions. Negative results must be combined with clinical observations, patient history, and epidemiological information. The expected result is Negative.  Fact Sheet for Patients: SugarRoll.be  Fact Sheet for Healthcare Providers: https://www.woods-mathews.com/  This test is not yet approved or cleared by the [place] FDA and  has been authorized for detection and/or diagnosis of SARS-CoV-2 by FDA under an Emergency Use Authorization (EUA). This EUA will remain  in effect (meaning this test can be used) for the duration of the COVID-19 declaration under Se ction 564(b)(1) of the Act, 21 U.S.C. section 360bbb-3(b)(1), unless the authorization is terminated or revoked sooner.  Performed at [facility], [place] [address], [place], [place] [address]          Radiology Studies: No results found.      Scheduled Meds:  Chlorhexidine Gluconate Cloth  6 each Topical Daily   cyanocobalamin  1,000 mcg Intramuscular Q0600   docusate sodium  100 mg Oral BID   folic acid  1 mg Intravenous Daily   free water  105 mL Per Tube Q6H   LORazepam  2 mg Intravenous Once   multivitamin with minerals  1 tablet Oral Daily  pantoprazole (PROTONIX) IV  40 mg Intravenous Q12H   PHENObarbital  65 mg Intravenous BID   senna  1 tablet Oral BID   [START ON 07/14/2021] thiamine  100 mg Oral Daily   Or   [START ON 07/14/2021] thiamine  100 mg Intravenous Daily   Continuous Infusions:  dextrose 5 % and 0.45% NaCl 100 mL/hr at 07/13/21 0911   feeding supplement (OSMOLITE 1.2 CAL)     levETIRAcetam 250 mg (07/13/21 0748)   methocarbamol (ROBAXIN) IV       LOS: 5 days      Phillips Climes, MD [facility]   To contact the attending provider between 7A-7P or the covering provider during after hours 7P-7A, please log into the web site [email or web address] and access using universal [place] password for that web site. If you do not have the password, please call the hospital operator.  07/13/2021, 11:15 AM   Patient ID: KEITON COSMA, male   DOB: 08/03/1940, 81 y.o.   MRN: [id number] Patient ID: MECHEL SCHUTTER, male   DOB: Jan 20, 1941, 81 y.o.   MRN: [id number] Patient ID: DEMPSY DAMIANO, male   DOB: 01-May-1941, 81 y.o.   MRN: [id number]

## 2021-07-14 ENCOUNTER — Inpatient Hospital Stay (HOSPITAL_COMMUNITY): Payer: Medicare HMO

## 2021-07-14 DIAGNOSIS — J9601 Acute respiratory failure with hypoxia: Secondary | ICD-10-CM

## 2021-07-14 DIAGNOSIS — R6521 Severe sepsis with septic shock: Secondary | ICD-10-CM

## 2021-07-14 DIAGNOSIS — D62 Acute posthemorrhagic anemia: Secondary | ICD-10-CM

## 2021-07-14 DIAGNOSIS — I959 Hypotension, unspecified: Secondary | ICD-10-CM

## 2021-07-14 DIAGNOSIS — Z139 Encounter for screening, unspecified: Secondary | ICD-10-CM

## 2021-07-14 DIAGNOSIS — S72001A Fracture of unspecified part of neck of right femur, initial encounter for closed fracture: Secondary | ICD-10-CM

## 2021-07-14 DIAGNOSIS — J9602 Acute respiratory failure with hypercapnia: Secondary | ICD-10-CM

## 2021-07-14 DIAGNOSIS — A419 Sepsis, unspecified organism: Secondary | ICD-10-CM

## 2021-07-14 LAB — CBC
HCT: 32.9 % — ABNORMAL LOW (ref 39.0–52.0)
HCT: 30.3 % — ABNORMAL LOW (ref 39.0–52.0)
Hemoglobin: 10.5 g/dL — ABNORMAL LOW (ref 13.0–17.0)
Hemoglobin: 9.6 g/dL — ABNORMAL LOW (ref 13.0–17.0)
MCH: 28.8 pg (ref 26.0–34.0)
MCH: 29.2 pg (ref 26.0–34.0)
MCHC: 31.9 g/dL (ref 30.0–36.0)
MCHC: 31.7 g/dL (ref 30.0–36.0)
MCV: 90.1 fL (ref 80.0–100.0)
MCV: 92.1 fL (ref 80.0–100.0)
Platelets: 352 10*3/uL (ref 150–400)
Platelets: 326 10*3/uL (ref 150–400)
RBC: 3.65 MIL/uL — ABNORMAL LOW (ref 4.22–5.81)
RBC: 3.29 MIL/uL — ABNORMAL LOW (ref 4.22–5.81)
RDW: 17.5 % — ABNORMAL HIGH (ref 11.5–15.5)
RDW: 17.5 % — ABNORMAL HIGH (ref 11.5–15.5)
WBC: 9.4 10*3/uL (ref 4.0–10.5)
WBC: 7.3 10*3/uL (ref 4.0–10.5)
nRBC: 0.3 % — ABNORMAL HIGH (ref 0.0–0.2)
nRBC: 0.3 % — ABNORMAL HIGH (ref 0.0–0.2)

## 2021-07-14 LAB — BASIC METABOLIC PANEL
Anion gap: 5 (ref 5–15)
BUN: 28 mg/dL — ABNORMAL HIGH (ref 8–23)
CO2: 18 mmol/L — ABNORMAL LOW (ref 22–32)
Calcium: 7.7 mg/dL — ABNORMAL LOW (ref 8.9–10.3)
Chloride: 122 mmol/L — ABNORMAL HIGH (ref 98–111)
Creatinine, Ser: 1.27 mg/dL — ABNORMAL HIGH (ref 0.61–1.24)
GFR, Estimated: 57 mL/min — ABNORMAL LOW (ref >60)
Glucose, Bld: 126 mg/dL — ABNORMAL HIGH (ref 70–99)
Potassium: 4.5 mmol/L (ref 3.5–5.1)
Sodium: 145 mmol/L (ref 135–145)

## 2021-07-14 LAB — POCT I-STAT 7, (LYTES, BLD GAS, ICA,H+H)
Acid-base deficit: 9 mmol/L — ABNORMAL HIGH (ref 0.0–2.0)
Bicarbonate: 19.2 mmol/L — ABNORMAL LOW (ref 20.0–28.0)
Calcium, Ion: 1.24 mmol/L (ref 1.15–1.40)
HCT: 29 % — ABNORMAL LOW (ref 39.0–52.0)
Hemoglobin: 9.9 g/dL — ABNORMAL LOW (ref 13.0–17.0)
O2 Saturation: 93 %
Patient temperature: 97.6
Potassium: 4.5 mmol/L (ref 3.5–5.1)
Sodium: 144 mmol/L (ref 135–145)
TCO2: 21 mmol/L — ABNORMAL LOW (ref 22–32)
pCO2 arterial: 51.5 mmHg — ABNORMAL HIGH (ref 32.0–48.0)
pH, Arterial: 7.176 — CL (ref 7.350–7.450)
pO2, Arterial: 84 mmHg (ref 83.0–108.0)

## 2021-07-14 LAB — GLUCOSE, CAPILLARY
Glucose-Capillary: 112 mg/dL — ABNORMAL HIGH (ref 70–99)
Glucose-Capillary: 121 mg/dL — ABNORMAL HIGH (ref 70–99)
Glucose-Capillary: 139 mg/dL — ABNORMAL HIGH (ref 70–99)
Glucose-Capillary: 148 mg/dL — ABNORMAL HIGH (ref 70–99)
Glucose-Capillary: 163 mg/dL — ABNORMAL HIGH (ref 70–99)

## 2021-07-14 LAB — PHOSPHORUS: Phosphorus: 2.5 mg/dL (ref 2.5–4.6)

## 2021-07-14 LAB — BLOOD GAS, ARTERIAL
Acid-base deficit: 8.1 mmol/L — ABNORMAL HIGH (ref 0.0–2.0)
Bicarbonate: 19.6 mmol/L — ABNORMAL LOW (ref 20.0–28.0)
Drawn by: 51133
FIO2: 80
O2 Saturation: 82.6 %
Patient temperature: 36.8
pCO2 arterial: 60.7 mmHg — ABNORMAL HIGH (ref 32.0–48.0)
pH, Arterial: 7.134 — CL (ref 7.350–7.450)
pO2, Arterial: 59.2 mmHg — ABNORMAL LOW (ref 83.0–108.0)

## 2021-07-14 LAB — MAGNESIUM: Magnesium: 1.6 mg/dL — ABNORMAL LOW (ref 1.7–2.4)

## 2021-07-14 IMAGING — DX DG ABD PORTABLE 1V
1 series · 1 of 1 positions shown · non-contrast
Comparison: [DATE].

CLINICAL DATA: NG tube placement.

EXAM:
PORTABLE CHEST 1 VIEW, abdomen portable.

[abdomen]
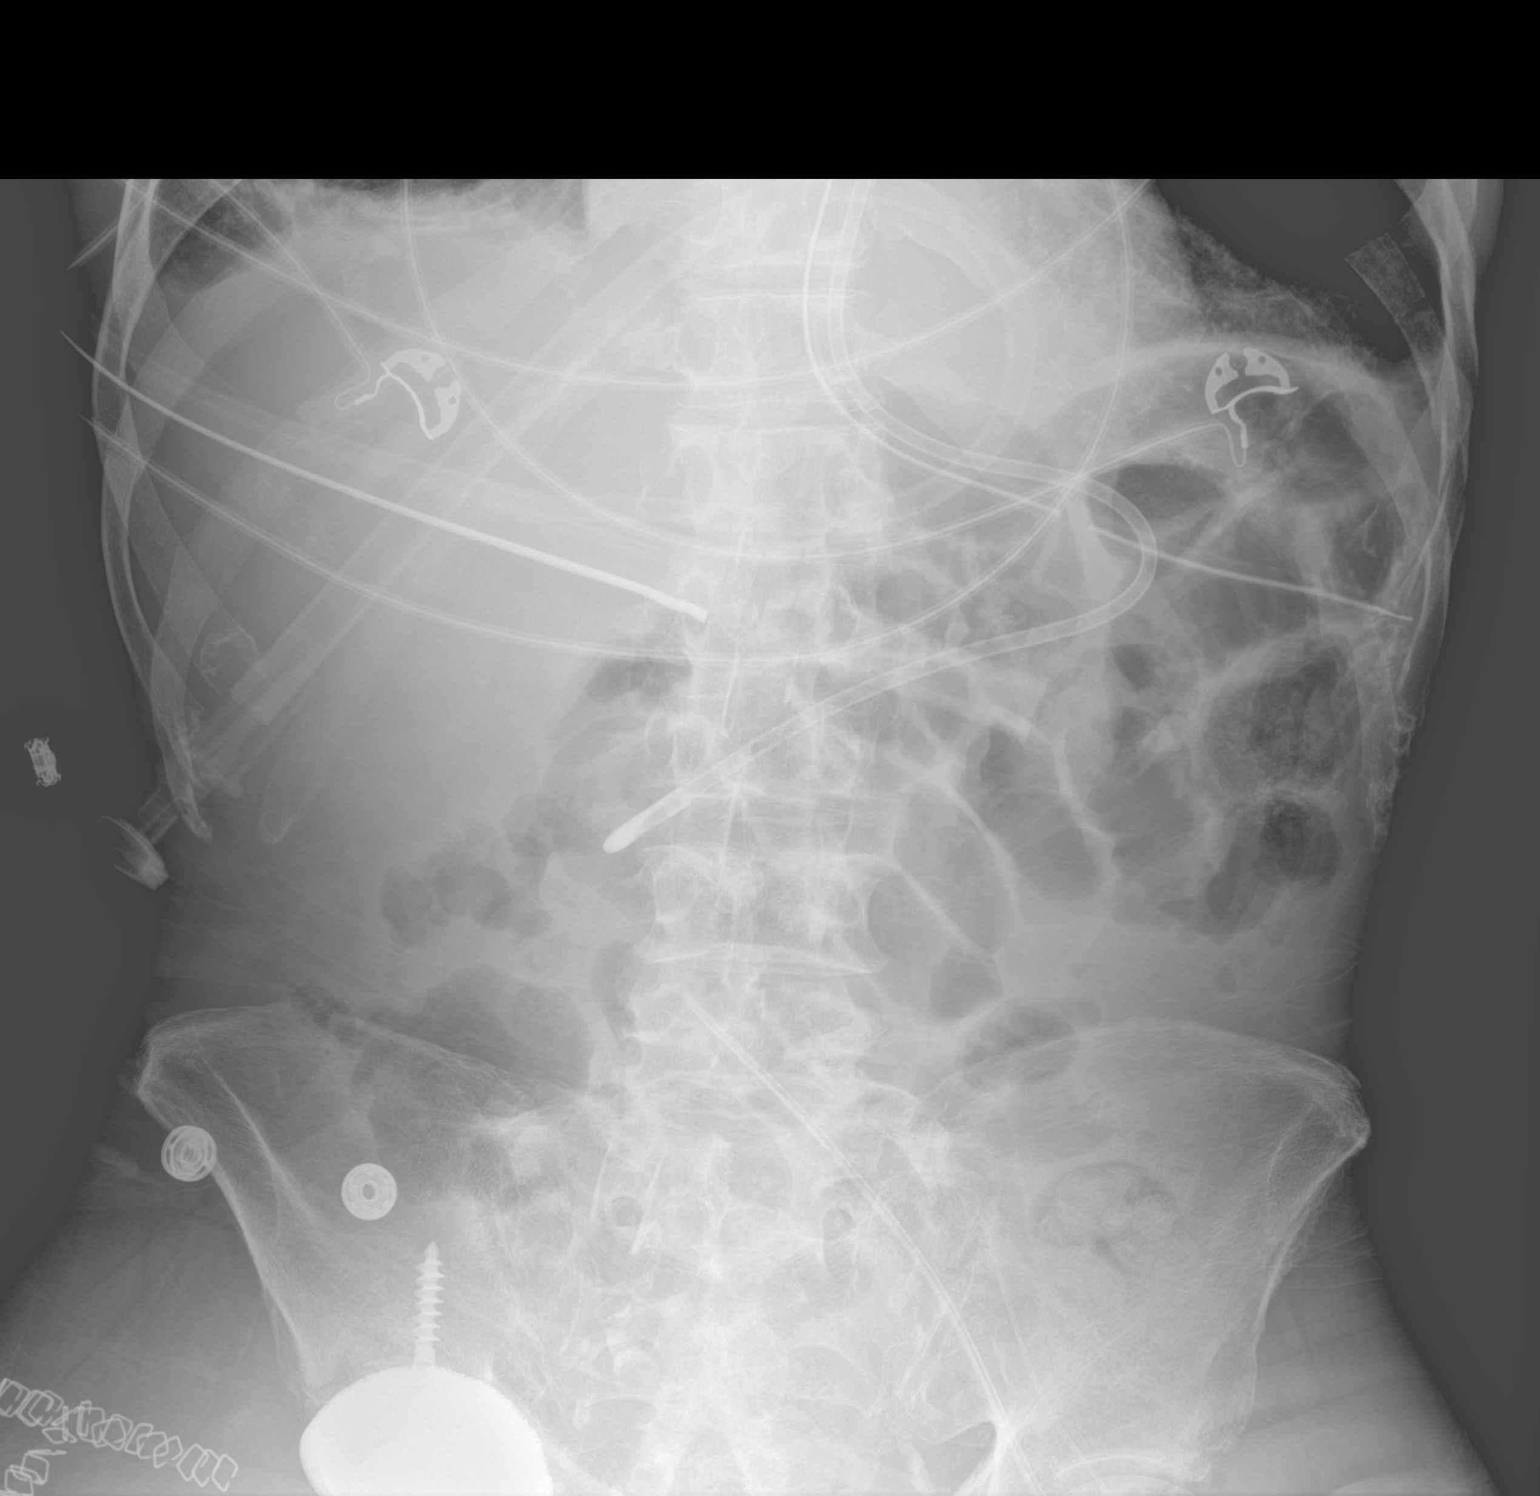

[1 of 1 positions shown; findings below may reference images not displayed]

FINDINGS: The heart is stable in size and mediastinal contours are within
normal limits. Atherosclerotic calcification of the aorta is noted.
Interstitial and airspace opacities are present in the right lung
and significantly improved from the prior exam. There is minimal
airspace disease at the left lung base. Blunting of the costophrenic
angles are noted bilaterally suggesting small bilateral pleural
effusions. Surgical changes are noted in the mid right lung. No
pneumothorax. An endotracheal tube terminates 2.5 cm above the
carina.

A feeding tube and enteric tube are noted in the stomach. No bowel
obstruction is seen. A catheter is seen over the pelvis on the left
terminating to the right of midline. Total hip arthroplasty changes
are noted on the right.
IMPRESSION: 1. Interval placement of nasogastric tube and endotracheal tubes and
remaining medical devices as described above.
2. Interstitial and airspace opacities in the right lungs with
markedly improved aeration as compared with the previous exam.
Findings may represent edema or infiltrate. Minimal residual
airspace disease is noted at the left lung base.
3. Small bilateral pleural effusions.

## 2021-07-14 IMAGING — DX DG CHEST 1V PORT
1 series · 2 of 2 positions shown · non-contrast
Comparison: [DATE].

CLINICAL DATA: Respiratory distress.

EXAM:
PORTABLE CHEST 1 VIEW

[Series 1: chest ap · 0.14mm/px · 2 of 2 slices shown]
[im 1/2]
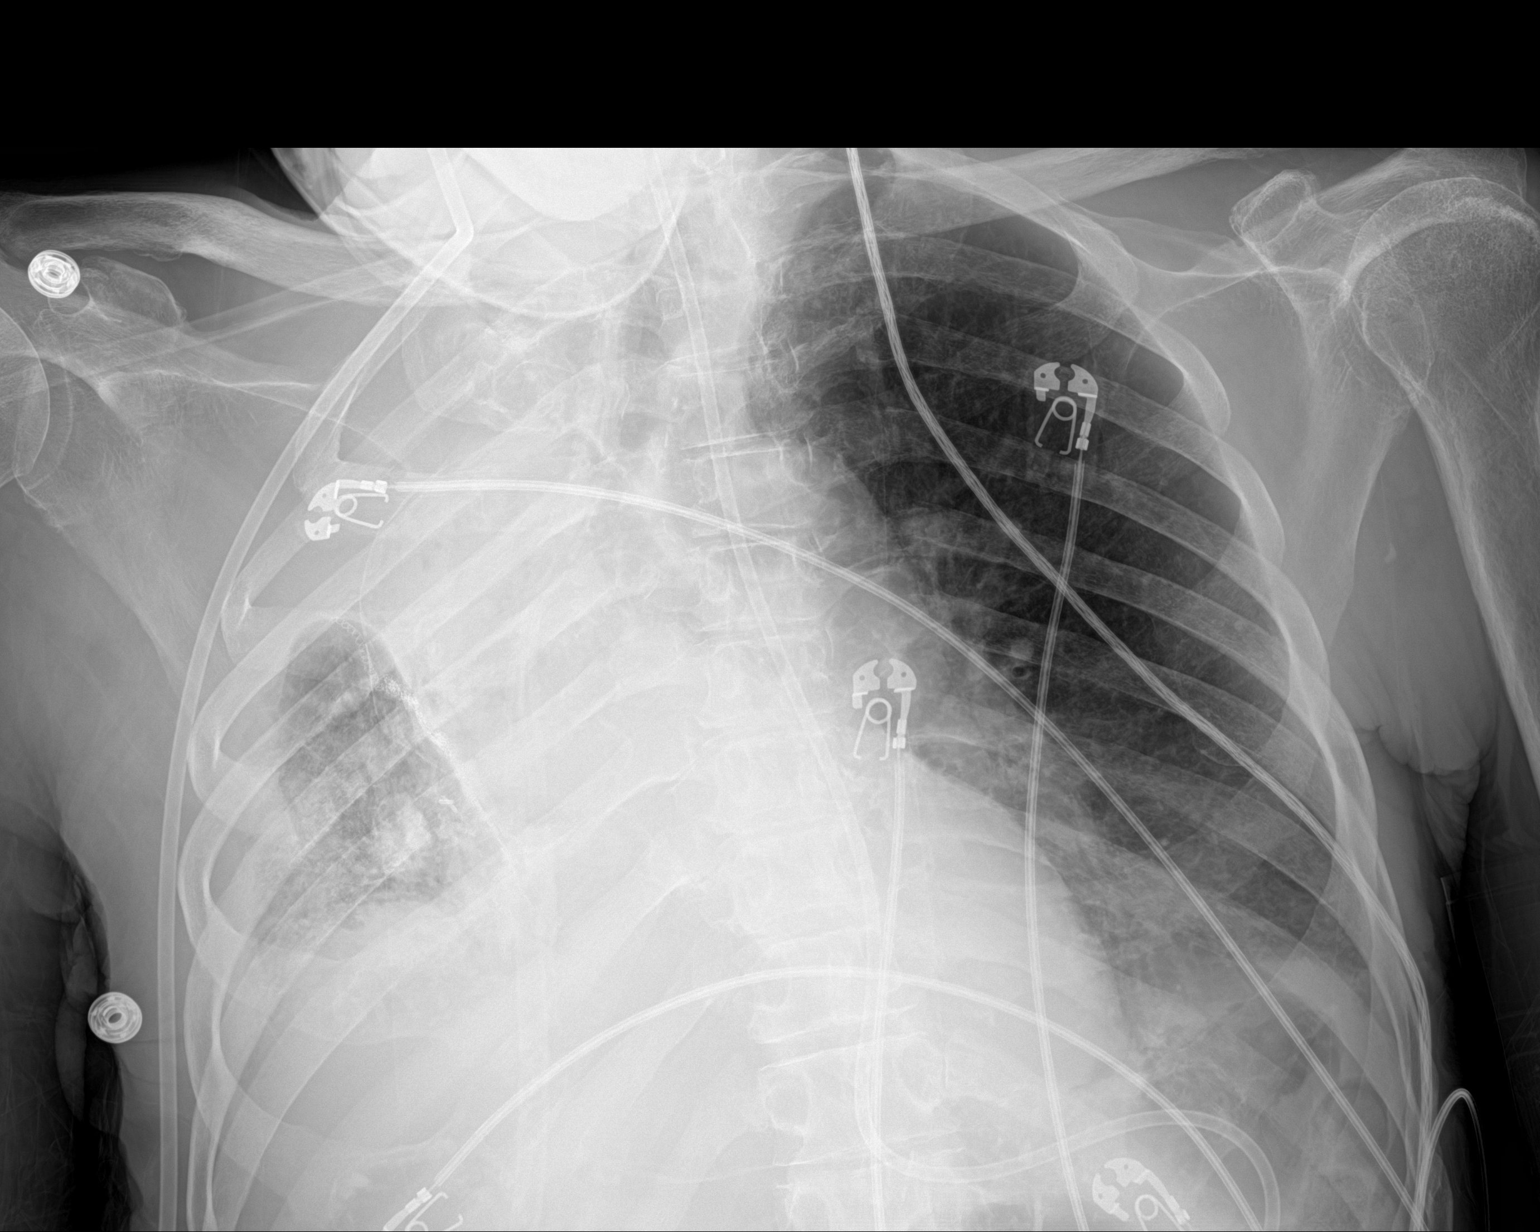
[im 2/2]
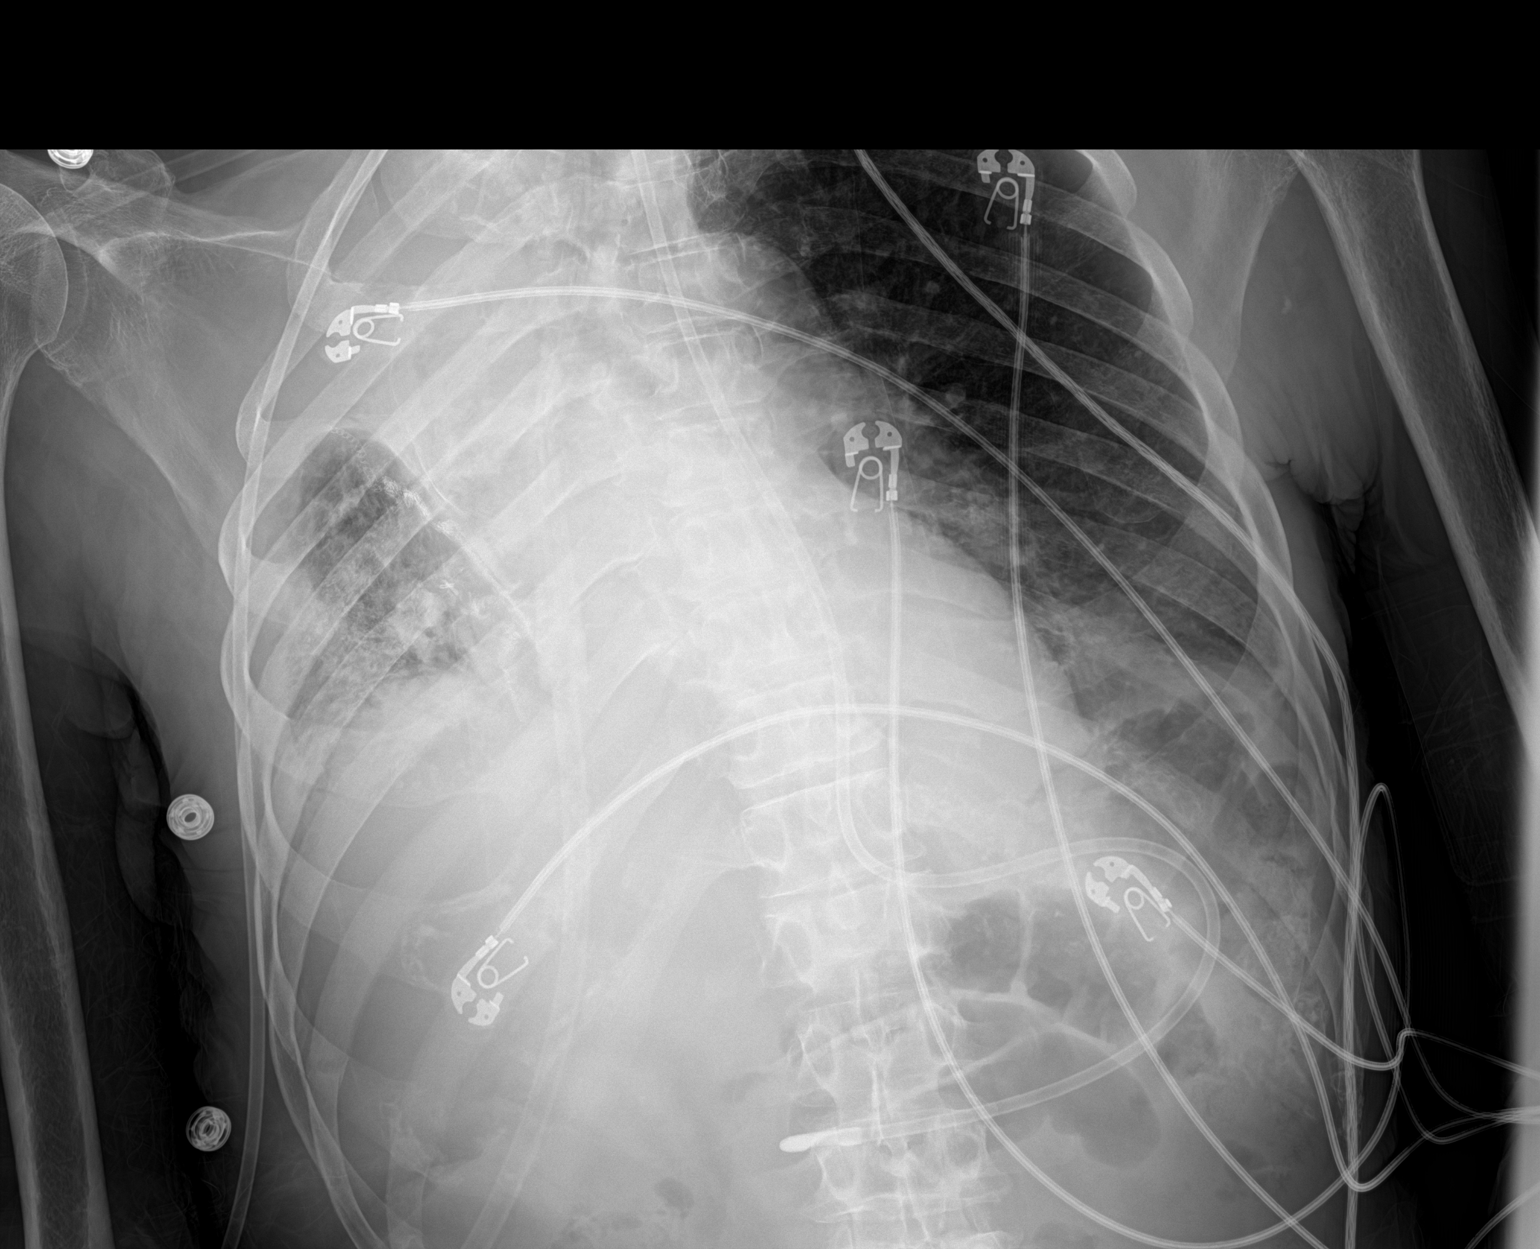

[2 of 2 positions shown; findings below may reference images not displayed]

FINDINGS: The heart is stable in size. There is reduced lung volume on the
right with mediastinal shift to the right. Postsurgical changes are
noted in the mid right lung. There is near complete opacification of
the right lung with mild residual aeration at the right lung base. A
small to moderate pleural effusion is noted at the right lung base.
Hazy airspace disease is noted at the left lung base. No
pneumothorax. No acute osseous abnormality. An enteric tube
terminates in the distal stomach.
IMPRESSION: 1. Reduced lung volume on the right with mediastinal shift to the
right.
2. Near complete opacification of the right lung with mild residual
aeration at the right lung base, possible atelectasis or infiltrate.
Mild airspace disease is also present at the left lung base.
3. Small moderate right pleural effusion.
4. Postsurgical changes in the mid right lung.

## 2021-07-14 MED ORDER — CHLORHEXIDINE GLUCONATE 0.12% ORAL RINSE (MEDLINE KIT)
15.0000 mL | Freq: Two times a day (BID) | OROMUCOSAL | Status: DC
  Administered 2021-07-14 – 2021-07-27 (×27): 15 mL via OROMUCOSAL

## 2021-07-14 MED ORDER — FENTANYL CITRATE (PF) 100 MCG/2ML IJ SOLN: 50.0000 ug | Freq: Once | INTRAMUSCULAR | Status: AC

## 2021-07-14 MED ORDER — FENTANYL CITRATE PF 50 MCG/ML IJ SOSY: 50.0000 ug | PREFILLED_SYRINGE | INTRAMUSCULAR | Status: DC | PRN

## 2021-07-14 MED ORDER — NOREPINEPHRINE 4 MG/250ML-% IV SOLN
2.0000 ug/min | INTRAVENOUS | Status: DC
  Administered 2021-07-14: 14 ug/min via INTRAVENOUS
  Administered 2021-07-15 (×2): 10 ug/min via INTRAVENOUS
  Filled 2021-07-14 (×5): qty 250

## 2021-07-14 MED ORDER — SODIUM CHLORIDE 0.9 % IV SOLN
250.0000 mg | Freq: Two times a day (BID) | INTRAVENOUS | Status: DC
  Administered 2021-07-14 – 2021-07-15 (×2): 250 mg via INTRAVENOUS
  Filled 2021-07-14 (×5): qty 2.5

## 2021-07-14 MED ORDER — SODIUM CHLORIDE 0.9% FLUSH
10.0000 mL | Freq: Two times a day (BID) | INTRAVENOUS | Status: DC
  Administered 2021-07-15 – 2021-07-18 (×9): 10 mL
  Administered 2021-07-19: 40 mL
  Administered 2021-07-19 – 2021-07-21 (×4): 10 mL
  Administered 2021-07-21: 22:00:00 30 mL
  Administered 2021-07-22 – 2021-07-24 (×5): 10 mL
  Administered 2021-07-25: 30 mL
  Administered 2021-07-25 – 2021-07-27 (×5): 10 mL

## 2021-07-14 MED ORDER — SODIUM CHLORIDE 0.9% FLUSH: 10.0000 mL | INTRAVENOUS | Status: DC | PRN

## 2021-07-14 MED ORDER — DOCUSATE SODIUM 50 MG/5ML PO LIQD
100.0000 mg | Freq: Two times a day (BID) | ORAL | Status: DC
  Administered 2021-07-15 – 2021-07-20 (×11): 100 mg
  Filled 2021-07-14 (×15): qty 10

## 2021-07-14 MED ORDER — LEVETIRACETAM 250 MG PO TABS: 250.0000 mg | ORAL_TABLET | Freq: Two times a day (BID) | ORAL | Status: DC

## 2021-07-14 MED ORDER — POLYETHYLENE GLYCOL 3350 17 G PO PACK
17.0000 g | PACK | Freq: Every day | ORAL | Status: DC
  Administered 2021-07-15 – 2021-07-20 (×5): 17 g
  Filled 2021-07-14 (×6): qty 1

## 2021-07-14 MED ORDER — SODIUM CHLORIDE 0.9 % IV SOLN
3.0000 g | Freq: Two times a day (BID) | INTRAVENOUS | Status: DC
  Administered 2021-07-15: 3 g via INTRAVENOUS
  Filled 2021-07-14: qty 8

## 2021-07-14 MED ORDER — PHENOBARBITAL 32.4 MG PO TABS
64.8000 mg | ORAL_TABLET | Freq: Two times a day (BID) | ORAL | Status: DC
  Administered 2021-07-14 – 2021-07-20 (×13): 64.8 mg
  Filled 2021-07-14 (×14): qty 2

## 2021-07-14 MED ORDER — ETOMIDATE 2 MG/ML IV SOLN
20.0000 mg | Freq: Once | INTRAVENOUS | Status: AC
  Administered 2021-07-14: 20 mg via INTRAVENOUS

## 2021-07-14 MED ORDER — PROPOFOL 1000 MG/100ML IV EMUL
0.0000 ug/kg/min | INTRAVENOUS | Status: DC
  Administered 2021-07-14: 5 ug/kg/min via INTRAVENOUS
  Filled 2021-07-14: qty 100

## 2021-07-14 MED ORDER — SODIUM PHOSPHATES 45 MMOLE/15ML IV SOLN
15.0000 mmol | Freq: Once | INTRAVENOUS | Status: AC
  Administered 2021-07-14: 15 mmol via INTRAVENOUS
  Filled 2021-07-14: qty 5

## 2021-07-14 MED ORDER — SODIUM CHLORIDE 0.9 % IV SOLN: INTRAVENOUS | Status: DC

## 2021-07-14 MED ORDER — PHENOBARBITAL 32.4 MG PO TABS: 64.8000 mg | ORAL_TABLET | Freq: Two times a day (BID) | ORAL | Status: DC

## 2021-07-14 MED ORDER — ORAL CARE MOUTH RINSE
15.0000 mL | OROMUCOSAL | Status: DC
  Administered 2021-07-15 – 2021-07-27 (×126): 15 mL via OROMUCOSAL

## 2021-07-14 MED ORDER — ROCURONIUM BROMIDE 50 MG/5ML IV SOLN
60.0000 mg | Freq: Once | INTRAVENOUS | Status: AC
  Administered 2021-07-14: 60 mg via INTRAVENOUS
  Filled 2021-07-14: qty 6

## 2021-07-14 MED ORDER — SODIUM CHLORIDE 0.9 % IV SOLN
250.0000 mL | INTRAVENOUS | Status: DC
  Administered 2021-07-15: 250 mL via INTRAVENOUS

## 2021-07-14 NOTE — Progress Notes (Signed)
Physical Therapy Treatment Patient Details Name: Gary Stevenson MRN: [id number] DOB: 27-Sep-1940 Today's Date: 07/14/2021   History of Present Illness Patient is 81 years old male who presented to hospital after sustaining a right femoral neck fracture and acetabular fracture, now s/p right hip total arthroplasty on 07/23/2021. Pt (+) for seizure. Pt with past medical history of right frontal subdural hematomas status post surgical evacuation in 2012, lung cancer status post lobectomy, alcohol abuse, CKD    PT Comments    Pt slumped down in bed upon arrival to room, difficult to rouse and secretion-laden breath sounds noted. Pt was fairly lethargic throughout session, but had increased eye opening and visual tracking once sitting EOB with max truncal support. Overall, pt requiring max-total +2 to dangle EOB and perform bed mobility to change tube feed-saturated bedsheets, RN notified that cortrak appears to be leaking. PT to continue assess appropriate level of care at d/c, hoping pt will be more awake and alert in follow up PT sessions to continue to support d/c to AIR.    Recommendations for follow up therapy are one component of a multi-disciplinary discharge planning process, led by the attending physician.  Recommendations may be updated based on patient status, additional functional criteria and insurance authorization.  Follow Up Recommendations  Acute inpatient rehab (3hours/day)     Assistance Recommended at Discharge Frequent or constant Supervision/Assistance  Patient can return home with the following Two people to help with walking and/or transfers;Two people to help with bathing/dressing/bathroom;Assistance with cooking/housework;Help with stairs or ramp for entrance;Assist for transportation   Equipment Recommendations  Other (comment) (defer to next venue and pending pt progress)    Recommendations for Other Services Rehab consult     Precautions / Restrictions  Precautions Precautions: Fall Precaution Comments: seizures post op Restrictions Weight Bearing Restrictions: Yes RLE Weight Bearing: Weight bearing as tolerated     Mobility  Bed Mobility Overal bed mobility: Needs Assistance Bed Mobility: Supine to Sit, Sit to Supine     Supine to sit: Total assist, +2 for physical assistance, +2 for safety/equipment Sit to supine: Total assist, +2 for physical assistance, +2 for safety/equipment   General bed mobility comments: all aspects, used helicopter method for to/from EOB. max truncal support required in sitting with PT keeping LEs in knee flexion at EOB to prevent anterior slide forawrd off bed.    Transfers                   General transfer comment: not attempted due to safety and level of arousal.    Ambulation/Gait                   Stairs             Wheelchair Mobility    Modified Rankin (Stroke Patients Only)       Balance Overall balance assessment: Needs assistance Sitting-balance support: Bilateral upper extremity supported, Feet supported Sitting balance-Leahy Scale: Zero Sitting balance - Comments: required max A                                    Cognition Arousal/Alertness: Lethargic Behavior During Therapy: Flat affect Overall Cognitive Status: Difficult to assess                                 General Comments: Pt extremely  lethargic this session. Difficult to rouse. Once sitting EOB pt opened his eyes. 0% of cues followed this session. Grimiced to pain during ROM of rt hip only.        Exercises General Exercises - Lower Extremity Long Arc Quad: PROM, Both, 5 reps, Seated Heel Slides: Both, 5 reps, Supine, PROM    General Comments General comments (skin integrity, edema, etc.): VSS on RA, pt with cortrak leaking upon PT arrival to room and appears to be missing the clear plastic connection piece. Tube feeds paused for duration of session and at  end of session for RN to assess leak. Pt's HOB at 29 degrees with pt slumped upon PT and OT arrival to room, PT/OT hooked the mattress knobs to prevent slide down in bed, HOB elevated to 40 degreees for pulmonary health at end of session. Pt's stepdaughter Stanton Kidney arrived to room by end of session      Pertinent Vitals/Pain Pain Assessment Pain Assessment: Faces Faces Pain Scale: Hurts a little bit Pain Location: Mild grimicing with Rt hip ROM Pain Descriptors / Indicators: Grimacing, Guarding Pain Intervention(s): Limited activity within patient's tolerance, Monitored during session    Home Living Family/patient expects to be discharged to:: Unsure Living Arrangements: Children Available Help at Discharge: Family;Available 24 hours/day Type of Home: House Home Access: Stairs to enter Entrance Stairs-Rails: Right;Left;Can reach both Entrance Stairs-Number of Steps: 2   Home Layout: One level Home Equipment: Cane - single point Additional Comments: pt unable to provide hx - obtained from chart    Prior Function            PT Goals (current goals can now be found in the care plan section) Acute Rehab PT Goals Patient Stated Goal: n/a PT Goal Formulation: With family Time For Goal Achievement: 07/26/21 Potential to Achieve Goals: Good Progress towards PT goals: Not progressing toward goals - comment (lethargy)    Frequency    Min 3X/week      PT Plan Current plan remains appropriate    Co-evaluation PT/OT/SLP Co-Evaluation/Treatment: Yes Reason for Co-Treatment: For patient/therapist safety;To address functional/ADL transfers;Necessary to address cognition/behavior during functional activity;Complexity of the patient's impairments (multi-system involvement) PT goals addressed during session: Mobility/safety with mobility;Balance OT goals addressed during session: ADL's and self-care;Strengthening/ROM      AM-PAC PT "6 Clicks" Mobility   Outcome Measure  Help needed  turning from your back to your side while in a flat bed without using bedrails?: Total Help needed moving from lying on your back to sitting on the side of a flat bed without using bedrails?: Total Help needed moving to and from a bed to a chair (including a wheelchair)?: Total Help needed standing up from a chair using your arms (e.g., wheelchair or bedside chair)?: Total Help needed to walk in hospital room?: Total Help needed climbing 3-5 steps with a railing? : Total 6 Click Score: 6    End of Session Equipment Utilized During Treatment: Other (comment) (mitts reapplied at end of session) Activity Tolerance: Patient limited by lethargy Patient left: in bed;with call bell/phone within reach;with bed alarm set;with SCD's reapplied;with family/visitor present Nurse Communication: Mobility status;Other (comment) (lethargy, cortrak leaking) PT Visit Diagnosis: Unsteadiness on feet (R26.81);Other abnormalities of gait and mobility (R26.89);Muscle weakness (generalized) (M62.81);Difficulty in walking, not elsewhere classified (R26.2);Other symptoms and signs involving the nervous system (A19.379)     Time: 0240-9735 PT Time Calculation (min) (ACUTE ONLY): 28 min  Charges:  $Therapeutic Activity: 8-22 mins  Stacie Glaze, PT DPT Acute Rehabilitation Services Pager [phone]  Office [phone]    Louis Matte 07/14/2021, 12:52 PM

## 2021-07-14 NOTE — Progress Notes (Addendum)
Inpatient Rehab Admissions Coordinator:   I do not have a CIR bed to offer this Pt. Today. I spoke with PT and OT regarding this Pt. And both feel that Pt is currently unable to tolerate the intensity of CIR. I will follow for a few more sessions, but SNF may be more appropriate if he does not progress with therapies. I have notified Pt.'s daughter.   Clemens Catholic, [place], [place] [facility] Admissions Coordinator  [phone] ([place]) [phone] (office)

## 2021-07-14 NOTE — Procedures (Signed)
Central Venous Catheter Insertion Procedure Note  [name]  [id number]  [birth year]  Date:07/14/21  Time:10:33 PM   Provider Performing:Alec Mcphee R Mlissa Tamayo   Procedure: Insertion of Non-tunneled Central Venous Catheter(36556) with US guidance (64383)   Indication(s) Medication administration  Consent Unable to obtain consent due to emergent nature of procedure.  Anesthesia Topical only with 1% lidocaine   Timeout Verified patient identification, verified procedure, site/side was marked, verified correct patient position, special equipment/implants available, medications/allergies/relevant history reviewed, required imaging and test results available.  Sterile Technique Maximal sterile technique including full sterile barrier drape, hand hygiene, sterile gown, sterile gloves, mask, hair covering, sterile ultrasound probe cover (if used).  Procedure Description Area of catheter insertion was cleaned with chlorhexidine and draped in sterile fashion.  With real-time ultrasound guidance a central venous catheter was placed into the left femoral vein. Nonpulsatile blood flow and easy flushing noted in all ports.  The catheter was sutured in place and sterile dressing applied.  Complications/Tolerance None; patient tolerated the procedure well. Chest X-ray is ordered to verify placement for internal jugular or subclavian cannulation.   Chest x-ray is not ordered for femoral cannulation.  EBL Minimal  Specimen(s) None   Otilio Carpen Lacresia Darwish, PA-C

## 2021-07-14 NOTE — Progress Notes (Signed)
eLink Physician-Brief Progress Note Patient Name: Gary Stevenson DOB: [birth year] MRN: [id number]   Date of Service  07/14/2021  HPI/Events of Note  Patient transferred to the ICU from the medical ward secondary to acute hypoxemic, hypercapnic respiratory failure caused by right lung atelectasis, patient received bronchoscopy following intubation.  eICU Interventions  New Patient Evaluation.        [name] 07/14/2021, 10:26 PM

## 2021-07-14 NOTE — Significant Event (Signed)
Rapid Response Event Note   Reason for Call : Red MEWS (tachycardic, tachypneic)  Initial Focused Assessment:  -Afebrile, comatose and unresponsive to repeated noxious stimuli  (Reportedly was purposeful in movements when stimulated.) -HR 110 ST, 136/77(96), RR 23 and sats 90% on 15L NRB. Shallow respirations and no gag reflex. BBS Rhonchi/diminished. Bedside RN communicated with Dr. Cyd Silence for orders.  CBG 148  ABG- 7.13/60.7/59.2/19.6     Interventions:  -NTS per RRT with thick mucous removed -PCXR, ABG, EKG ordered per MD  -Pt was transferred to 2M02 for Urgent Intubation.   MD : Notified: Dr. Cyd Silence per bedside RN Call Time: 2016 Arrival Time: 2020 End Time: 2200  Madelynn Done, RN

## 2021-07-14 NOTE — Evaluation (Signed)
Occupational Therapy Evaluation Patient Details Name: Gary Stevenson MRN: [id number] DOB: 1941/03/11 Today's Date: 07/14/2021   History of Present Illness Patient is 81 years old male who presented to hospital after sustaining a right femoral neck fracture and acetabular fracture, now s/p right hip total arthroplasty on 07/05/2021. Pt (+) for seizure. Pt with past medical history of right frontal subdural hematomas status post surgical evacuation in 2012, lung cancer status post lobectomy, alcohol abuse, CKD   Clinical Impression   Gary Stevenson was generally mod I with SPC and several falls per family report. He lives with his daughter in a 1 level home, 2 STE. Upon arrival pt was asleep and difficult to rouse. Once transferred from supine>sitting EOB with total A +2 and max A for sitting balance, pt opened his eyes. He follow 0% of commands, and only grimaced to pain with R hip ROM. He requires total A for all aspects of his care this date due to level of arousal. Pt will benefit from OT acutely. Recommending CIR at this time pending pt progress acutely. Should pt not progress he will be more appropriate for SNF.      Recommendations for follow up therapy are one component of a multi-disciplinary discharge planning process, led by the attending physician.  Recommendations may be updated based on patient status, additional functional criteria and insurance authorization.   Follow Up Recommendations  Acute inpatient rehab (3hours/day) (CIR looking at pt for potential admission - this date pt more appropriate for SNF level therapies due to level of arrousal. However continue to recommend CIR and re-evaluate next date.)    Assistance Recommended at Discharge Frequent or constant Supervision/Assistance  Patient can return home with the following Two people to help with walking and/or transfers;Two people to help with bathing/dressing/bathroom;Assistance with feeding;Help with stairs or ramp for entrance;Assist  for transportation    Functional Status Assessment  Patient has had a recent decline in their functional status and demonstrates the ability to make significant improvements in function in a reasonable and predictable amount of time.  Equipment Recommendations  Other (comment) (pending pt progress)    Recommendations for Other Services Rehab consult     Precautions / Restrictions Precautions Precautions: Fall Precaution Comments: seizures post op Restrictions Weight Bearing Restrictions: Yes RLE Weight Bearing: Weight bearing as tolerated      Mobility Bed Mobility Overal bed mobility: Needs Assistance Bed Mobility: Supine to Sit, Sit to Supine     Supine to sit: Total assist, +2 for physical assistance, +2 for safety/equipment Sit to supine: Total assist, +2 for physical assistance, +2 for safety/equipment        Transfers                   General transfer comment: not attempted. safety. level of arousal.      Balance Overall balance assessment: Needs assistance Sitting-balance support: Bilateral upper extremity supported, Feet supported Sitting balance-Leahy Scale: Zero Sitting balance - Comments: required max A               ADL either performed or assessed with clinical judgement   ADL Overall ADL's : Needs assistance/impaired Eating/Feeding: NPO   Grooming: Total assistance   Upper Body Bathing: Total assistance   Lower Body Bathing: Total assistance;+2 for physical assistance;+2 for safety/equipment   Upper Body Dressing : Total assistance   Lower Body Dressing: Total assistance;+2 for physical assistance;+2 for safety/equipment   Toilet Transfer: Total assistance;+2 for physical assistance;+2 for safety/equipment  Toileting- Clothing Manipulation and Hygiene: Total assistance;+2 for physical assistance;+2 for safety/equipment       Functional mobility during ADLs: Total assistance;+2 for physical assistance;+2 for  safety/equipment General ADL Comments: total A for all aspects of care due to level of arousal this date. pt follows 0% of commands. no active movement initiated this session     Vision   Additional Comments: difficult to assess due to level of arousal. pt with eyes open, blink to threat bilaterally. does not track.     Perception     Praxis      Pertinent Vitals/Pain Pain Assessment Pain Assessment: Faces Faces Pain Scale: Hurts a little bit Pain Location: Mild grimicing with Rt hip ROM Pain Descriptors / Indicators: Grimacing Pain Intervention(s): Limited activity within patient's tolerance, Monitored during session     Hand Dominance Right   Extremity/Trunk Assessment Upper Extremity Assessment Upper Extremity Assessment: Difficult to assess due to impaired cognition (PROM of eblow, wrist and hand was [facility]. Pt not actively moving UEs at all.)   Lower Extremity Assessment Lower Extremity Assessment: Defer to PT evaluation   Cervical / Trunk Assessment Cervical / Trunk Assessment: Kyphotic   Communication Communication Communication: Expressive difficulties   Cognition Arousal/Alertness: Lethargic Behavior During Therapy: Flat affect Overall Cognitive Status: Difficult to assess                                 General Comments: Pt extremely lethargic this session. Difficult to rouse. Once sitting EOB pt opened his eyes. 0% of cues followed this session. Grimiced to pain during ROM of rt hip only.     General Comments  VS on RA. tube feeds leaking on bed upon arrival, looks as if it is missing a connection piece. RN notified. Mitts on upon arrival and donned at the end of the session. Pt's chest sounds are very wet. HOB placed at 40* with pt all the way up upon exit. Pt's step-daughter, Corrie Dandy present for part of the session.            Home Living Family/patient expects to be discharged to:: Unsure Living Arrangements: Children Available Help at  Discharge: Family;Available 24 hours/day Type of Home: House Home Access: Stairs to enter [employer] of Steps: 2 Entrance Stairs-Rails: Right;Left;Can reach both Home Layout: One level     Bathroom Shower/Tub: Tub/shower unit         Home Equipment: Cane - single point   Additional Comments: pt unable to provide hx - obtained from chart      Prior Functioning/Environment Prior Level of Function : Independent/Modified Independent;History of Falls (last six months)             Mobility Comments: Walking with SPC. Reports falls. family reports otherwise independent with ADLs and ambulation. Walks outside with [facility], able to take out trash.          OT Problem List: Decreased strength;Decreased range of motion;Impaired balance (sitting and/or standing);Decreased activity tolerance;Decreased safety awareness;Decreased knowledge of precautions;Impaired UE functional use;Decreased cognition      OT Treatment/Interventions: Self-care/ADL training;Therapeutic exercise;DME and/or AE instruction;Therapeutic activities;Patient/family education;Balance training    OT Goals(Current goals can be found in the care plan section) Acute Rehab OT Goals Patient Stated Goal: unable to state OT Goal Formulation: With patient Time For Goal Achievement: 07/25/2021 Potential to Achieve Goals: Good ADL Goals Pt Will Perform Grooming: with min assist;sitting Pt Will Perform Upper Body Dressing:  with min assist;sitting Additional ADL Goal #1: Pt will complete bed mobility with min A as a precursor to ADLs Additional ADL Goal #2: Pt will follow one step commands for 75% of session  OT Frequency: Min 2X/week    Co-evaluation PT/OT/SLP Co-Evaluation/Treatment: Yes Reason for Co-Treatment: Complexity of the patient's impairments (multi-system involvement);To address functional/ADL transfers;For patient/therapist safety   OT goals addressed during session: ADL's and  self-care;Strengthening/ROM      AM-PAC OT "6 Clicks" Daily Activity     Outcome Measure Help from another person eating meals?: Total Help from another person taking care of personal grooming?: Total Help from another person toileting, which includes using toliet, bedpan, or urinal?: Total Help from another person bathing (including washing, rinsing, drying)?: Total Help from another person to put on and taking off regular upper body clothing?: Total Help from another person to put on and taking off regular lower body clothing?: Total 6 Click Score: 6   End of Session Nurse Communication: Mobility status (tube feed leaking)  Activity Tolerance: Patient tolerated treatment well Patient left: in bed;with call bell/phone within reach;with bed alarm set  OT Visit Diagnosis: Unsteadiness on feet (R26.81);Other abnormalities of gait and mobility (R26.89);Muscle weakness (generalized) (M62.81);Pain;Adult, failure to thrive (R62.7);History of falling (Z91.81);Feeding difficulties (R63.3)                Time: 2025-4270 OT Time Calculation (min): 28 min Charges:  OT General Charges $OT Visit: 1 Visit OT Evaluation $OT Eval Moderate Complexity: 1 Mod   Laramie Meissner A Loy Little 07/14/2021, 10:46 AM

## 2021-07-14 NOTE — Procedures (Signed)
Intubation Procedure Note  Gary Stevenson  [id number]  [birth year]  Date:07/14/21  Time:10:36 PM   Provider Performing:Pryce Folts R Caoilainn Sacks    Procedure: Intubation (31500)  Indication(s) Respiratory Failure  Consent Risks of the procedure as well as the alternatives and risks of each were explained to the patient and/or caregiver.  Consent for the procedure was obtained and is signed in the bedside chart   Anesthesia none   Time Out Verified patient identification, verified procedure, site/side was marked, verified correct patient position, special equipment/implants available, medications/allergies/relevant history reviewed, required imaging and test results available.   Sterile Technique Usual hand hygeine, masks, and gloves were used   Procedure Description Patient positioned in bed supine.  Sedation given as noted above.  Patient was intubated with endotracheal tube using Glidescope.  View was Grade 1 full glottis .  Number of attempts was 1.  Colorimetric CO2 detector was consistent with tracheal placement.   Complications/Tolerance None; patient tolerated the procedure well.  Transient O2 drop which resolved quickly post intubation Chest X-ray is ordered to verify placement.   EBL Minimal   Specimen(s) None   Gary Stevenson Gary Cannedy, PA-C

## 2021-07-14 NOTE — Procedures (Addendum)
Bronchoscopy Procedure Note  Gary Stevenson  [id number]  Apr 30, 1941  Date:07/14/21  Time:10:01 PM   Provider Performing:Ahliyah Nienow M Verlee Monte   Procedure(s):  Flexible bronchoscopy with bronchial alveolar lavage [phone]) and Initial Therapeutic Aspiration of Tracheobronchial Tree [phone])  Indication(s) Aspiration pneumonitis Mucus plugging  Consent Unable to obtain consent due to emergent nature of procedure.  Anesthesia Etomidate 23m Fentanyl 50 mcg Rocuronium 60 mg   Time Out Verified patient identification, verified procedure, site/side was marked, verified correct patient position, special equipment/implants available, medications/allergies/relevant history reviewed, required imaging and test results available.   Sterile Technique Usual hand hygiene, masks, gowns, and gloves were used   Procedure Description Bronchoscope advanced through endotracheal tube and into airway.  Airways were examined down to subsegmental level with findings noted below.   Following diagnostic evaluation, BAL(s) performed in RUL anterior segment with normal saline and return of 20cc cloudy tan fluid. Copious secretions which were mucopurulent and similar in appearance to tube feed material were then suctioned from right mainstem.  Findings:  - copious secretions which were mucopurulent but also similar in  appearance to TF material completely occluded R mainstem and distal segmental airways. These were suctioned. - RML stump s/p RML lobectomy visualized - ETT in acceptable location at 24cm at lip - BAL performed as documented above  Complications/Tolerance None; patient tolerated the procedure well. Chest X-ray is needed post procedure.   EBL Minimal   Specimen(s) BAL sent for culture

## 2021-07-14 NOTE — Progress Notes (Signed)
Pharmacy Antibiotic Note  Gary Stevenson is a 81 y.o. male admitted on 07/07/2021 with aspiration pneumonia.  Pharmacy has been consulted for Unasyn dosing.  Plan: Unasyn 3g IV every 12 hours Watch renal function and adjust dosing as necessary Monitor clinical status and culture results  Height: 5\' 5"  (165.1 cm) Weight: 45.5 kg (100 lb 5 oz) IBW/kg (Calculated) : 61.5  Temp (24hrs), Avg:98 F (36.7 C), Min:97.6 F (36.4 C), Max:98.3 F (36.8 C)  Recent Labs  Lab 07/11/21 0248 07/11/21 0744 07/11/21 1835 07/12/21 0135 07/13/21 0019 07/14/21 0210  WBC 10.1  --  11.2* 11.3* 10.9* 9.4  CREATININE 1.64* 1.59*  --  1.51* 1.44* 1.27*    Estimated Creatinine Clearance: 29.9 mL/min (A) (by C-G formula based on SCr of 1.27 mg/dL (H)).    No Known Allergies  Antimicrobials this admission: Unasyn 1/17 >>  Dose adjustments this admission:   Microbiology results: 1/17 BCx: pending 1/17 Sputum: pending   Thank you for allowing pharmacy to be a part of this patients care.  Sloan Leiter, PharmD, BCPS, BCCCP Clinical Pharmacist Please refer to [facility] for [place] numbers 07/14/2021 10:57 PM

## 2021-07-14 NOTE — Progress Notes (Addendum)
PROGRESS NOTE    Gary Stevenson  LPF:[id number] DOB: 1940-09-17 DOA: 06/30/2021 PCP: Seward Carol, MD    No chief complaint on file.   Brief Narrative:   Patient is 81 years old male with past medical history of right frontal subdural hematomas status post surgical evacuation in 2012, lung cancer status post lobectomy, alcohol abuse, CKD presented to hospital after sustaining a right femoral neck fracture and acetabular fracture.  Patient underwent right hip total arthroplasty on 07/11/2021.   patient was found to be responsive only to painful stimulus and was lethargic and was noted to have seizure-like activity around 5:30 AM on 1/13 . Medical team was consulted for lethargy and concerns for seizure that lasted for around 1 minute.  As well patient work-up was significant for anemia, encephalopathy, B12 deficiency, he transferred to [facility].  -His work-up was significant for seizures, where he was kept on LTM EEG, or seizures has resolved with AEDs, he remains encephalopathic, but this has gradually improved, as well he had acute blood loss anemia where he required multiple PRBC transfusions, where his CBC has stabilized over the last couple days.  Assessment & Plan:   Principal Problem:   Closed displaced fracture of right femoral neck (HCC) Active Problems:   Acute on chronic blood loss anemia   Displaced fracture of right femoral neck (HCC)   Protein-calorie malnutrition, severe   Pressure injury of skin     Toxic metabolic encephalopathy -Multifactorial, likely postictal, as well due to subdural hematoma, with known history of chronic alcoholism and heavy alcohol abuse -He is on IV thiamine, with history of heavy alcohol abuse, as well Z32 and folic supplements.   -Encephalopathy continues to improve, but remains significantly altered and confused, still requiring some as needed event which makes him sometimes more sleepy during the day,. - cortrk inserted 1/16 and  started any tube feed, likely he will be able to pass swallow evaluation in the next 1 to 2 days, but unlikely he will maintain sufficient oral intake yet.    Seizures - LTM EEG done , possibly related to brain bleed/history of brain trauma, as well possible alcohol withdrawals -No further seizures. -Has signed off. -Now he has NG tube we will switch IV Keppra and phenobarbital to oral.  acute on chronic subdural hematoma.   - Patient does have history of subdural hematoma status postevacuation 2012. -Currently appears to be related to his recent fall. -  hold aspirin and NSAIDs. -Neurology on board -MRI obtained 1/16 showing extension of subdural hematoma, it is currently bilateral and has mildly increased in size, discussed with neurosurgery Dr. Annette Stable, recommendation is to hold aspirin and anticoagulation, continue to monitor clinically, no indication for neurosurgical intervention.    Close displaced fracture of the right femoral neck.   - Status post surgical intervention.  Per orthopedic.  Dressing appears to have changed yesterday, no evidence of oozing or bleeding through dressing today.   History of recurrent falls in the past.  Will likely need skilled nursing facility placement   Acute on chronic Blood loss anemia. -Transfused units PRBC yesterday, since H&H has been stable, continue to monitor CBC daily.   -D92 and folic acid is borderline low, started on IM supplements. -CBC has been stable over last 3 days after initial transfusion.   Severe protein calorie malnutrition.   -Patient has improved, but he remains encephalopathic, likely will pass swallow evaluation in 1 to 2 days, but I will doubt he will have  enough oral intake for first couple days, so we will insert cortrak and start on tube feed . - will monitor phosphorus level closely to avoid refeeding syndrome   Borderline hyperkalemia.   - monitor    CKD stage III A.   -Creatinine 1.5 today, continue to monitor  closely.  Hyperkalemia -Resolved  Hypernatremia -Started on D5 half-normal saline, will keep on free water once NGT inserted.  Failure to thrive/severe protein calorie malnutrition -Patient is extremely cachectic, BMI of 16.6, albumin of 1.5, severely malnutrition, blood glucose today 60, he is with hyponatremia as well, changed fluid, who request cortra k to start tube feeding by tomorrow  -Is extremely frail, deconditioned, with history of alcohol abuse, multiple falls, subdural hematoma, I have informed daughter his condition is tenuous/critical  Pressure ulcer -Continue with wound care. Pressure Injury 07/10/21 Sacrum Mid Stage 2 -  Partial thickness loss of dermis presenting as a shallow open injury with a red, pink wound bed without slough. 4 small open areas (Active)  07/10/21 1700  Location: Sacrum  Location Orientation: Mid  Staging: Stage 2 -  Partial thickness loss of dermis presenting as a shallow open injury with a red, pink wound bed without slough.  Wound Description (Comments): 4 small open areas  Present on Admission: Yes     Pressure Injury 07/13/21 Scrotum Posterior Stage 2 -  Partial thickness loss of dermis presenting as a shallow open injury with a red, pink wound bed without slough. (Active)  07/13/21   Location: Scrotum  Location Orientation: Posterior  Staging: Stage 2 -  Partial thickness loss of dermis presenting as a shallow open injury with a red, pink wound bed without slough.  Wound Description (Comments):   Present on Admission: No       DVT prophylaxis: SCD Code Status: FU:: Family Communication: Discussed with daughter at bedside 07/11/2021 , daughter updated by phone 1/17. Disposition:   Status is: Inpatient  Remains inpatient appropriate because: seizures, anemia       Consultants:  Orthopedic Neurology  Subjective:  No significant events overnight as discussed with staff, he did require some Ativan overnight for  restlessness Objective: Vitals:   07/14/21 0435 07/14/21 0447 07/14/21 0459 07/14/21 0806  BP: (!) 151/93   (!) 149/94  Pulse: (!) 114 (!) 109 (!) 104 95  Resp: 20 20 20 18   Temp:    97.8 F (36.6 C)  TempSrc:    Axillary  SpO2: 94% 94% 95% 100%  Weight:      Height:        Intake/Output Summary (Last 24 hours) at 07/14/2021 1102 Last data filed at 07/14/2021 2831 Gross per 24 hour  Intake 1499.85 ml  Output 800 ml  Net 699.85 ml   Filed Weights   07/03/2021 1700 07/07/2021 1129  Weight: 45.5 kg 45.5 kg    Examination:  Patient is extremely frail, cachectic, malnourished, more awake and appropriate, but remains significantly altered, unable to answer any questions appropriately  Symmetrical Chest wall movement, diminished air entry at the bases RRR,No Gallops,Rubs or new Murmurs, No Parasternal Heave +ve B.Sounds, Abd Soft, No tenderness, No rebound - guarding or rigidity. No Cyanosis, Clubbing or edema, No new Rash or bruise     Data Reviewed: I have personally reviewed following labs and imaging studies  CBC: Recent Labs  Lab 07/11/21 0248 07/11/21 0744 07/11/21 1835 07/12/21 0135 07/13/21 0019 07/14/21 0210  WBC 10.1  --  11.2* 11.3* 10.9* 9.4  HGB 5.6* 6.0* 8.9*  9.7* 9.5* 10.5*  HCT 16.9* 18.3* 26.2* 28.7* 29.3* 32.9*  MCV 86.2  --  87.6 88.0 89.6 90.1  PLT 298  --  280 307 317 725    Basic Metabolic Panel: Recent Labs  Lab 07/10/21 0325 07/11/21 0248 07/11/21 0744 07/12/21 0135 07/12/21 1118 07/13/21 0019 07/14/21 0210  NA 137 144 145 146*  --  147* 145  K 5.4* 4.2 4.2 4.5  --  4.3 4.5  CL 105 117* 118* 119*  --  121* 122*  CO2 18* 18* 17* 15*  --  16* 18*  GLUCOSE 134* 77 81 60*  --  82 126*  BUN 73* 56* 54* 45*  --  37* 28*  CREATININE 1.65* 1.64* 1.59* 1.51*  --  1.44* 1.27*  CALCIUM 7.6* 7.2* 7.1* 7.7*  --  8.1* 7.7*  MG 1.9 1.9  --   --   --  1.9 1.6*  PHOS  --  4.0  --   --  3.8 3.3 2.5    GFR: Estimated Creatinine Clearance: 29.9  mL/min (A) (by C-G formula based on SCr of 1.27 mg/dL (H)).  Liver Function Tests: Recent Labs  Lab 06/28/2021 0833 07/15/2021 0658 07/11/21 0248 07/11/21 0632 07/11/21 0744  AST 35 30 23 23 22   ALT 24 19 8 8 8   ALKPHOS 172* 167* 97 100 101  BILITOT 0.3 0.8 0.4 0.5 0.5  PROT 7.8 7.7 5.1* 5.1* 5.2*  ALBUMIN 2.6* 2.3* 1.5* 1.5* 1.5*    CBG: Recent Labs  Lab 07/13/21 1143 07/13/21 1711 07/13/21 2201 07/14/21 0058 07/14/21 0807  GLUCAP 138* 142* 123* 112* 139*     Recent Results (from the past 240 hour(s))  Surgical pcr screen     Status: Abnormal   Collection Time: 07/06/2021  8:33 AM   Specimen: Nasal Mucosa; Nasal Swab  Result Value Ref Range Status   MRSA, PCR NEGATIVE NEGATIVE Final   Staphylococcus aureus POSITIVE (A) NEGATIVE Final    Comment: (NOTE) The Xpert SA Assay (FDA approved for NASAL specimens in patients 80 years of age and older), is one component of a comprehensive surveillance program. It is not intended to diagnose infection nor to guide or monitor treatment. Performed at [facility], [place] [address], [place], [place] [address]   SARS CORONAVIRUS 2 (TAT 6-24 HRS) Nasopharyngeal Nasopharyngeal Swab     Status: None   Collection Time: 07/13/2021  8:33 AM   Specimen: Nasopharyngeal Swab  Result Value Ref Range Status   SARS Coronavirus 2 NEGATIVE NEGATIVE Final    Comment: (NOTE) SARS-CoV-2 target nucleic acids are NOT DETECTED.  The SARS-CoV-2 RNA is generally detectable in upper and lower respiratory specimens during the acute phase of infection. Negative results do not preclude SARS-CoV-2 infection, do not rule out co-infections with other pathogens, and should not be used as the sole basis for treatment or other patient management decisions. Negative results must be combined with clinical observations, patient history, and epidemiological information. The expected result is Negative.  Fact Sheet for  Patients: SugarRoll.be  Fact Sheet for Healthcare Providers: https://www.woods-mathews.com/  This test is not yet approved or cleared by the [place] FDA and  has been authorized for detection and/or diagnosis of SARS-CoV-2 by FDA under an Emergency Use Authorization (EUA). This EUA will remain  in effect (meaning this test can be used) for the duration of the COVID-19 declaration under Se ction 564(b)(1) of the Act, 21 U.S.C. section 360bbb-3(b)(1), unless the authorization is terminated or  revoked sooner.  Performed at [facility], [place] [address], [place], [place] [address]          Radiology Studies: DG Chest 1 View  Result Date: 07/13/2021 CLINICAL DATA:  MRI screening. EXAM: CHEST  1 VIEW COMPARISON:  07/16/2009 and CT chest 12/19/2008. FINDINGS: Trachea is midline. Feeding tube is followed into the distal stomach. Heart size stable. Right infrahilar airspace consolidation with surgical clips or fiducial markers. No additional radiopaque foreign bodies surrounding hazy opacification in the right lung base. Difficult to exclude small bilateral pleural effusions. IMPRESSION: 1. Right perihilar masslike consolidation with surgical clips and/or fiducial markers. Disease recurrence cannot be excluded. Consider CT chest with contrast in further evaluation, as clinically indicated. 2. No additional radiopaque foreign bodies. Electronically Signed   By: Lorin Picket M.D.   On: 07/13/2021 12:57   DG Abd 1 View  Result Date: 07/13/2021 CLINICAL DATA:  MRI screening. EXAM: ABDOMEN - 1 VIEW COMPARISON:  None. FINDINGS: Right hip arthroplasty. Otherwise, no radiopaque foreign body. Feeding tube terminates in the distal stomach. Stool is scattered in the colon. A tiny screw is seen in the soft tissues the proximal forearm. IMPRESSION: Right hip arthroplasty. Otherwise, no unexpected radiopaque foreign body in the visualized portions of the  abdomen/pelvis. Electronically Signed   By: Lorin Picket M.D.   On: 07/13/2021 12:53   MR BRAIN WO CONTRAST  Result Date: 07/13/2021 CLINICAL DATA:  Altered mental status.  Subdural hematoma. EXAM: MRI HEAD WITHOUT CONTRAST TECHNIQUE: Multiplanar, multiecho pulse sequences of the brain and surrounding structures were obtained without intravenous contrast. COMPARISON:  Head CT 07/10/2021 FINDINGS: Brain: T1 hyperintense subdural hematomas over the anterior frontal convexities extend inferiorly along the floor of the anterior cranial fossa bilaterally and also extend into the middle cranial fossa on the right. These subdural hematomas measure up to 8 mm in thickness on the right and 5 mm on the left. There is very mild mass effect on the anterior inferior right frontal lobe. Scattered chronic microhemorrhages are noted in both cerebral hemispheres, including in the deep gray nuclei, and in the brainstem and cerebellum suggesting chronic hypertension. No acute infarct, mass, or midline shift is evident. Confluent T2 hyperintensities in the cerebral white matter and patchy T2 hyperintensities in the pons are nonspecific but compatible with extensive chronic small vessel ischemic disease with asymmetric involvement noted in the left parieto-occipital region. There is advanced cerebral atrophy. Vascular: Major intracranial vascular flow voids are preserved. Skull and upper cervical spine: Lateral right frontal craniotomy. Advanced C1-2 arthropathy. Sinuses/Orbits: Unremarkable orbits. Small right mastoid effusion. Largely clear paranasal sinuses. Other: None. IMPRESSION: 1. Small bilateral subdural hematomas. Slight mass effect on the right frontal lobe. No midline shift. 2. Extensive chronic small vessel ischemic disease and cerebral atrophy. Electronically Signed   By: Logan Bores M.D.   On: 07/13/2021 14:19        Scheduled Meds:  Chlorhexidine Gluconate Cloth  6 each Topical Daily   cyanocobalamin   1,000 mcg Intramuscular Q0600   docusate  100 mg Per Tube BID   folic acid  1 mg Intravenous Daily   free water  105 mL Per Tube Q6H   LORazepam  2 mg Intravenous Once   multivitamin with minerals  1 tablet Oral Daily   pantoprazole (PROTONIX) IV  40 mg Intravenous Q12H   PHENObarbital  65 mg Intravenous BID   senna  1 tablet Oral BID   thiamine  100 mg Oral Daily  Or   thiamine  100 mg Intravenous Daily   Continuous Infusions:  dextrose 5 % and 0.45% NaCl 100 mL/hr at 07/13/21 1430   feeding supplement (OSMOLITE 1.2 CAL) 30 mL/hr at 07/14/21 0000   levETIRAcetam 250 mg (07/14/21 6060)   methocarbamol (ROBAXIN) IV     sodium phosphate  Dextrose 5% IVPB 15 mmol (07/14/21 0851)     LOS: 6 days      Phillips Climes, MD [facility]   To contact the attending provider between 7A-7P or the covering provider during after hours 7P-7A, please log into the web site [email or web address] and access using universal [place] password for that web site. If you do not have the password, please call the hospital operator.  07/14/2021, 11:02 AM   Patient ID: Lorenda Hatchet, male   DOB: [birth year], 81 y.o.   MRN: [id number] Patient ID: BRENIN HEIDELBERGER, male   DOB: [birth year], 81 y.o.   MRN: [id number] Patient ID: VERNICE BOWKER, male   DOB: Mar 20, 1941, 81 y.o.   MRN: [id number] Patient ID: NYAIRE DENBLEYKER, male   DOB: Jan 20, 1941, 81 y.o.   MRN: [id number]

## 2021-07-14 NOTE — Consult Note (Addendum)
NAME:  Gary Stevenson, MRN:  [id number], DOB:  1940/11/07, LOS: 6 ADMISSION DATE:  07/14/2021, CONSULTATION DATE:  07/14/21 REFERRING MD: Martyn Malay  , CHIEF COMPLAINT:  AMS  History of Present Illness:  Gary Stevenson is a 81 y.o. M with PMH significant for chronic subdurals, lung Ca, ETOH abuse, CKD who presented to the ED 1/12 after a fall and was found to have R acetabular fx with interval displacement and underwent R total hip arthroplasty on 07/25/2021.  He was later found with decreased responsiveness and seizure activity that lasted for around 1 minute.  He was seen by Neurology and started on Phenobarbital and Keppra with LTM EEG.  Neurology felt seizure was most likely secondary to subdural hematoma and he was gradually improving until 1/17 when he was noted to have worsening mental status again with hypotension.  ABG 7.134 and pCO2 60.7, patient's mental status was not amenable for Bipap, so PCCM consulted for transfer.   Pertinent  Medical History   has a past medical history of Arthritis, Brain aneurysm, ETOH abuse, and Lung cancer (HCC).   Significant Hospital Events: Including procedures, antibiotic start and stop dates in addition to other pertinent events   1/11 admission for R hip fx 1/12 R hip arthoplasty 1/13 Sz activity 1/17 worsening mental status, PCCM consult and ICU txfr, intubated  Interim History / Subjective:  As above  Objective   Blood pressure 100/60, pulse (!) 102, temperature 98.2 F (36.8 C), temperature source Oral, resp. rate (!) 36, height 5\' 5"  (1.651 m), weight 45.5 kg, SpO2 (!) 89 %.        Intake/Output Summary (Last 24 hours) at 07/14/2021 2102 Last data filed at 07/14/2021 1700 Gross per 24 hour  Intake 1575.82 ml  Output 700 ml  Net 875.82 ml   Filed Weights   07/10/2021 1700 07/13/2021 1129  Weight: 45.5 kg 45.5 kg    General:  thin, poorly nourished M in acute respiratory distress HEENT: MM pink/dry, pupils equal Neuro: unresponsive on arrival  to ICU, minimal spontaneous respirations CV: s1s2 rrr, no m/r/g PULM:  Decreased air entry throughout R side, in acute hypoxic respiratory failure with minimal respiratory effort GI: soft, non-distended Extremities: warm/dry, no edema, minimal muscle mass  Skin: no rashes or lesions  Resolved Hospital Problem list     Assessment & Plan:    Acute Encephalopathy and hypercarbic respiratory failure in the setting of recent seizure activity and acute on chronic subdural hematoma  Does not appear that patient received increased sedating medications -transferred to ICU and required intubation -repeat head CT -EEG  -Neurology has seen, signed off, likely re-engage pending head CT -continue phenobarbital and renally dosed Keppra  --Maintain full vent support with SAT/SBT as tolerated -titrate Vent setting to maintain SpO2 greater than or equal to 90%. -HOB elevated 30 degrees. -Plateau pressures less than 30 cm H20.  -Follow chest x-ray, ABG prn.   -Bronchial hygiene and RT/bronchodilator protocol. -thiamine and folate    Acute Hypoxic and Hypercarbic Respiratory Failure  R PNA Shock R-sided whiteout on CXR, frothy secretions  -BAL completed, send for culture -Start Unasyn  -Blood cultures x2 -check stat labs and lactic acid - -Maintain full vent support with SAT/SBT as tolerated -titrate Vent setting to maintain SpO2 greater than or equal to 90%. -HOB elevated 30 degrees. -Plateau pressures less than 30 cm H20.  -Follow chest x-ray, ABG prn.   -Bronchial hygiene and RT/bronchodilator protocol.   Recent ABLA Transfused yesterday, continue to  trend         Best Practice (right click and "Reselect all SmartList Selections" daily)   Diet/type: NPO DVT prophylaxis: systemic heparin GI prophylaxis: PPI Lines: Central line Foley:  Yes, and it is still needed Code Status:  full code Last date of multidisciplinary goals of care discussion [hospitalist unable to reach  family overnight, will attempt to call]  Labs   CBC: Recent Labs  Lab 07/11/21 0248 07/11/21 0744 07/11/21 1835 07/12/21 0135 07/13/21 0019 07/14/21 0210  WBC 10.1  --  11.2* 11.3* 10.9* 9.4  HGB 5.6* 6.0* 8.9* 9.7* 9.5* 10.5*  HCT 16.9* 18.3* 26.2* 28.7* 29.3* 32.9*  MCV 86.2  --  87.6 88.0 89.6 90.1  PLT 298  --  280 307 317 352    Basic Metabolic Panel: Recent Labs  Lab 07/10/21 0325 07/11/21 0248 07/11/21 0744 07/12/21 0135 07/12/21 1118 07/13/21 0019 07/14/21 0210  NA 137 144 145 146*  --  147* 145  K 5.4* 4.2 4.2 4.5  --  4.3 4.5  CL 105 117* 118* 119*  --  121* 122*  CO2 18* 18* 17* 15*  --  16* 18*  GLUCOSE 134* 77 81 60*  --  82 126*  BUN 73* 56* 54* 45*  --  37* 28*  CREATININE 1.65* 1.64* 1.59* 1.51*  --  1.44* 1.27*  CALCIUM 7.6* 7.2* 7.1* 7.7*  --  8.1* 7.7*  MG 1.9 1.9  --   --   --  1.9 1.6*  PHOS  --  4.0  --   --  3.8 3.3 2.5   GFR: Estimated Creatinine Clearance: 29.9 mL/min (A) (by C-G formula based on SCr of 1.27 mg/dL (H)). Recent Labs  Lab 07/11/21 1835 07/12/21 0135 07/13/21 0019 07/14/21 0210  WBC 11.2* 11.3* 10.9* 9.4    Liver Function Tests: Recent Labs  Lab 07/25/2021 0833 07/21/2021 0658 07/11/21 0248 07/11/21 0632 07/11/21 0744  AST 35 30 23 23 22   ALT 24 19 8 8 8   ALKPHOS 172* 167* 97 100 101  BILITOT 0.3 0.8 0.4 0.5 0.5  PROT 7.8 7.7 5.1* 5.1* 5.2*  ALBUMIN 2.6* 2.3* 1.5* 1.5* 1.5*   No results for input(s): LIPASE, AMYLASE in the last 168 hours. No results for input(s): AMMONIA in the last 168 hours.  ABG    Component Value Date/Time   PHART 7.134 (LL) 07/14/2021 2028   PCO2ART 60.7 (H) 07/14/2021 2028   PO2ART 59.2 (L) 07/14/2021 2028   HCO3 19.6 (L) 07/14/2021 2028   TCO2 22 05/06/2021 1609   ACIDBASEDEF 8.1 (H) 07/14/2021 2028   O2SAT 82.6 07/14/2021 2028     Coagulation Profile: No results for input(s): INR, PROTIME in the last 168 hours.  Cardiac Enzymes: No results for input(s): CKTOTAL, CKMB,  CKMBINDEX, TROPONINI in the last 168 hours.  HbA1C: No results found for: HGBA1C  CBG: Recent Labs  Lab 07/13/21 2201 07/14/21 0058 07/14/21 0807 07/14/21 1216 07/14/21 1607  GLUCAP 123* 112* 139* 121* 163*    Review of Systems:   Unable to obtain secondary to mental status  Past Medical History:  He,  has a past medical history of Arthritis, Brain aneurysm, ETOH abuse, and Lung cancer (HCC).   Surgical History:   Past Surgical History:  Procedure Laterality Date   BRAIN SURGERY     COLONOSCOPY N/A 03/18/2014   Procedure: COLONOSCOPY;  Surgeon: Charna Elizabeth, MD;  Location: [facility];  Service: Endoscopy;  Laterality: N/A;   ESOPHAGOGASTRODUODENOSCOPY N/A 03/18/2014  Procedure: ESOPHAGOGASTRODUODENOSCOPY (EGD);  Surgeon: Charna Elizabeth, MD;  Location: [facility];  Service: Endoscopy;  Laterality: N/A;   LUNG SURGERY       Social History:   reports that he has quit smoking. He has never used smokeless tobacco. He reports current alcohol use. He reports that he does not use drugs.   Family History:  His family history is not on file.   Allergies No Known Allergies   Home Medications  Prior to Admission medications   Medication Sig Start Date End Date Taking? Authorizing Provider  HYDROcodone-acetaminophen (NORCO/VICODIN) 5-325 MG tablet Take 1 tablet by mouth every 6 (six) hours as needed for moderate pain. 05/27/21  Yes Vanetta Mulders, MD  naproxen sodium (ALEVE) 220 MG tablet Take 220 mg by mouth daily as needed (pain).   Yes [provider]  indomethacin (INDOCIN) 50 MG capsule Take 1 capsule (50 mg total) by mouth 2 (two) times daily as needed. Patient not taking: Reported on 07/03/2021 02/19/21   Particia Nearing, PA-C  meloxicam (MOBIC) 15 MG tablet Take 1 tablet (15 mg total) by mouth daily. Patient not taking: Reported on 07/03/2021 12/17/20   Lenn Sink, DPM     Critical care time: 50 minutes      CRITICAL CARE Performed by: Darcella Gasman  Mireya Meditz   Total critical care time: 50 minutes  Critical care time was exclusive of separately billable procedures and treating other patients.  Critical care was necessary to treat or prevent imminent or life-threatening deterioration.  Critical care was time spent personally by me on the following activities: development of treatment plan with patient and/or surrogate as well as nursing, discussions with consultants, evaluation of patient's response to treatment, examination of patient, obtaining history from patient or surrogate, ordering and performing treatments and interventions, ordering and review of laboratory studies, ordering and review of radiographic studies, pulse oximetry and re-evaluation of patient's condition.  Darcella Gasman Abayomi Pattison, PA-C [facility] See Amion for pager If no response to pager , please call [phone] until 7pm After 7:00 pm call Elink  [phone]

## 2021-07-14 NOTE — Procedures (Signed)
Arterial Catheter Insertion Procedure Note  Gary Stevenson  [id number]  01-Jan-1941  Date:07/14/21  Time:10:33 PM    Provider Performing: Otilio Carpen Noa Galvao    Procedure: Insertion of Arterial Line [phone]) with US guidance ([id number])   Indication(s) Blood pressure monitoring and/or need for frequent ABGs  Consent Unable to obtain consent due to emergent nature of procedure.  Anesthesia None   Time Out Verified patient identification, verified procedure, site/side was marked, verified correct patient position, special equipment/implants available, medications/allergies/relevant history reviewed, required imaging and test results available.   Sterile Technique Maximal sterile technique including full sterile barrier drape, hand hygiene, sterile gown, sterile gloves, mask, hair covering, sterile ultrasound probe cover (if used).   Procedure Description Area of catheter insertion was cleaned with chlorhexidine and draped in sterile fashion. With real-time ultrasound guidance an arterial catheter was placed into the left femoral artery.  Appropriate arterial tracings confirmed on monitor.     Complications/Tolerance None; patient tolerated the procedure well.   EBL Minimal   Specimen(s) None  Otilio Carpen Breanna Mcdaniel, PA-C

## 2021-07-14 NOTE — Progress Notes (Addendum)
HOSPITAL MEDICINE OVERNIGHT EVENT NOTE    Contacted by nursing due to substantially worsening lethargy over the course of the evening.  Chart reviewed, patient was hospitalized on 1/12 for hip fracture status postrepair by orthopedic surgery.  Patient exhibited seizure-like activity with decreased responsiveness on 1/13 with involvement of both hospital medicine and neurology at that time.  Patient was initiated on antiepileptics and transferred to the medicine service on 1/14.  Patient was also noted to have acute on chronic bilateral subdural hematomas, presumably secondary to the fall with MRI on 1/16 revealing some increase in the hematomas measuring 8 mm on the right with some mass-effect of the right frontal lobe.  Nursing reports to me that in addition to worsening lethargy patient has become increasingly hypoxic.  Respiratory therapy has been called to come to the bedside and suction was attempted, successful and aspirating a small amount of brownish sputum.  Due to hypoxia, supplemental oxygen has been increased and now patient has been placed on a nonrebreather mask.    I went to promptly evaluate the patient at the bedside.  Patient is not responding to verbal or painful stimuli.  Patient is cachectic in appearance.  Dramatically reduced lung sounds throughout the right lung fields with poor inspiratory effort.  Stat ABG revealed pH of 7.134 with PCO2 of 60.7 and PaO2 of 59.2 on a nonrebreather mask.  Stat chest x-ray personally reviewed revealing near complete opacification of the right lung with mediastinal shift.  I immediately became concerned that tube feeds may be ending up in the right lung and have asked nursing to stop the tube feeds immediately.  Alternatively there could be progression of the patient's known acute on chronic subdural hematomas.  I attempted to contact the daughter via the listed contact number in the chart to confirm goals of care and patient's full CODE STATUS but  unfortunately got voicemail and left a message for her to call back as soon as possible.  I I then contacted Dr. Verlee Monte with PCCM and informed him of the patient's deteriorating condition and my feeling that the patient requires immediate intubation and initiation of mechanical ventilation.  He agrees and has requested that the patient be transferred to the intensive care unit for intubation and transfer to the [facility] service.  I have personally escorted the patient to the intensive care unit with a rapid response and the bedside nurse and respiratory therapy.  This is in spite of PCCM is appreciated.  CRITICAL CARE ATTESTATION  Patient is at significant risk of morbidity and mortality secondary to rapidly progressive hypoxic respiratory failure secondary to complete opacification of the right lung, possibly secondary to tube feed aspiration or possibly somehow secondary to progression of the patient's known subdural hematomas.  Patient required thorough and immediate bedside assessment including physical exam as well as obtaining ABG and chest imaging with personal interpretation of the studies and coordination with critical care medicine.  Critical care time spent:  45 minutes.  Vernelle Emerald  MD [facility]

## 2021-07-14 NOTE — Progress Notes (Signed)
1900: received report from day shift RN who had placed pt on 2L at some time during the day r/t pt's sats dropping. Around 1845 day RN said she noticed pt's breathing was worsening and called RT who bumped pt up to 5L.   1920: pt's breath sounds ronchous, was tachypnic and tachycardic. O2 increased, RT called again, nasopharangeal suctioning performed. Dark brown thick mucous retrieved, but with no increase in pt's SpO2, which fluctuated between the 70s and 80s despite use of HFNC and NRB. Pt noted to have no gag reflex when oral suction was attempted, and pt's pupils did not seem reactive to light.  2000: MD and CN notified of red MEWS; new orders received and carried out.  2016- Rapid RN called  2040- MD to see pt  2115- Pt tx to ICU

## 2021-07-14 NOTE — Progress Notes (Signed)
Pt's daughter updated with overnight events. Plans to be here in the morning.  Otilio Carpen Missael Ferrari, PA-C

## 2021-07-15 ENCOUNTER — Inpatient Hospital Stay (HOSPITAL_COMMUNITY): Payer: Medicare HMO

## 2021-07-15 ENCOUNTER — Encounter (HOSPITAL_COMMUNITY): Payer: Self-pay | Admitting: Orthopedic Surgery

## 2021-07-15 DIAGNOSIS — G9341 Metabolic encephalopathy: Secondary | ICD-10-CM

## 2021-07-15 DIAGNOSIS — J690 Pneumonitis due to inhalation of food and vomit: Secondary | ICD-10-CM

## 2021-07-15 DIAGNOSIS — R569 Unspecified convulsions: Secondary | ICD-10-CM

## 2021-07-15 LAB — GLUCOSE, CAPILLARY
Glucose-Capillary: 65 mg/dL — ABNORMAL LOW (ref 70–99)
Glucose-Capillary: 121 mg/dL — ABNORMAL HIGH (ref 70–99)
Glucose-Capillary: 60 mg/dL — ABNORMAL LOW (ref 70–99)
Glucose-Capillary: 98 mg/dL (ref 70–99)
Glucose-Capillary: 89 mg/dL (ref 70–99)
Glucose-Capillary: 99 mg/dL (ref 70–99)
Glucose-Capillary: 124 mg/dL — ABNORMAL HIGH (ref 70–99)
Glucose-Capillary: 84 mg/dL (ref 70–99)
Glucose-Capillary: 104 mg/dL — ABNORMAL HIGH (ref 70–99)

## 2021-07-15 LAB — CBC
HCT: 29.7 % — ABNORMAL LOW (ref 39.0–52.0)
Hemoglobin: 9.7 g/dL — ABNORMAL LOW (ref 13.0–17.0)
MCH: 29.6 pg (ref 26.0–34.0)
MCHC: 32.7 g/dL (ref 30.0–36.0)
MCV: 90.5 fL (ref 80.0–100.0)
Platelets: 287 10*3/uL (ref 150–400)
RBC: 3.28 MIL/uL — ABNORMAL LOW (ref 4.22–5.81)
RDW: 17.4 % — ABNORMAL HIGH (ref 11.5–15.5)
WBC: 2.7 10*3/uL — ABNORMAL LOW (ref 4.0–10.5)
nRBC: 0.7 % — ABNORMAL HIGH (ref 0.0–0.2)

## 2021-07-15 LAB — PROTIME-INR
INR: 1.2 (ref 0.8–1.2)
Prothrombin Time: 15.3 seconds — ABNORMAL HIGH (ref 11.4–15.2)

## 2021-07-15 LAB — POCT I-STAT 7, (LYTES, BLD GAS, ICA,H+H)
Acid-base deficit: 9 mmol/L — ABNORMAL HIGH (ref 0.0–2.0)
Bicarbonate: 16.9 mmol/L — ABNORMAL LOW (ref 20.0–28.0)
Calcium, Ion: 1.2 mmol/L (ref 1.15–1.40)
HCT: 27 % — ABNORMAL LOW (ref 39.0–52.0)
Hemoglobin: 9.2 g/dL — ABNORMAL LOW (ref 13.0–17.0)
O2 Saturation: 90 %
Patient temperature: 97.6
Potassium: 4.2 mmol/L (ref 3.5–5.1)
Sodium: 144 mmol/L (ref 135–145)
TCO2: 18 mmol/L — ABNORMAL LOW (ref 22–32)
pCO2 arterial: 35.2 mmHg (ref 32.0–48.0)
pH, Arterial: 7.287 — ABNORMAL LOW (ref 7.350–7.450)
pO2, Arterial: 64 mmHg — ABNORMAL LOW (ref 83.0–108.0)

## 2021-07-15 LAB — LACTIC ACID, PLASMA
Lactic Acid, Venous: 0.9 mmol/L (ref 0.5–1.9)
Lactic Acid, Venous: 1.2 mmol/L (ref 0.5–1.9)

## 2021-07-15 LAB — COMPREHENSIVE METABOLIC PANEL
ALT: 10 U/L (ref 0–44)
AST: 28 U/L (ref 15–41)
Albumin: <1.5 g/dL — ABNORMAL LOW (ref 3.5–5.0)
Alkaline Phosphatase: 183 U/L — ABNORMAL HIGH (ref 38–126)
Anion gap: 4 — ABNORMAL LOW (ref 5–15)
BUN: 28 mg/dL — ABNORMAL HIGH (ref 8–23)
CO2: 18 mmol/L — ABNORMAL LOW (ref 22–32)
Calcium: 7.3 mg/dL — ABNORMAL LOW (ref 8.9–10.3)
Chloride: 118 mmol/L — ABNORMAL HIGH (ref 98–111)
Creatinine, Ser: 1.26 mg/dL — ABNORMAL HIGH (ref 0.61–1.24)
GFR, Estimated: 58 mL/min — ABNORMAL LOW (ref >60)
Glucose, Bld: 145 mg/dL — ABNORMAL HIGH (ref 70–99)
Potassium: 4.4 mmol/L (ref 3.5–5.1)
Sodium: 140 mmol/L (ref 135–145)
Total Bilirubin: 0.7 mg/dL (ref 0.3–1.2)
Total Protein: 6 g/dL — ABNORMAL LOW (ref 6.5–8.1)

## 2021-07-15 LAB — BASIC METABOLIC PANEL
Anion gap: 5 (ref 5–15)
BUN: 28 mg/dL — ABNORMAL HIGH (ref 8–23)
CO2: 17 mmol/L — ABNORMAL LOW (ref 22–32)
Calcium: 7.2 mg/dL — ABNORMAL LOW (ref 8.9–10.3)
Chloride: 119 mmol/L — ABNORMAL HIGH (ref 98–111)
Creatinine, Ser: 1.24 mg/dL (ref 0.61–1.24)
GFR, Estimated: 59 mL/min — ABNORMAL LOW (ref >60)
Glucose, Bld: 89 mg/dL (ref 70–99)
Potassium: 4.2 mmol/L (ref 3.5–5.1)
Sodium: 141 mmol/L (ref 135–145)

## 2021-07-15 LAB — APTT: aPTT: 29 seconds (ref 24–36)

## 2021-07-15 LAB — COOXEMETRY PANEL
Carboxyhemoglobin: 0.9 % (ref 0.5–1.5)
Methemoglobin: 1 % (ref 0.0–1.5)
O2 Saturation: 75.9 %
Total hemoglobin: 10.2 g/dL — ABNORMAL LOW (ref 12.0–16.0)

## 2021-07-15 LAB — TRIGLYCERIDES: Triglycerides: 14 mg/dL (ref <150)

## 2021-07-15 LAB — MAGNESIUM: Magnesium: 1.5 mg/dL — ABNORMAL LOW (ref 1.7–2.4)

## 2021-07-15 LAB — PHENOBARBITAL LEVEL: Phenobarbital: 25.7 ug/mL (ref 15.0–30.0)

## 2021-07-15 LAB — PHOSPHORUS: Phosphorus: 4.2 mg/dL (ref 2.5–4.6)

## 2021-07-15 IMAGING — MR MR HEAD W/O CM
4 of 10 series · 24 of 48 positions shown · non-contrast
Comparison: Head CT [DATE]; MRI of the brain [DATE].

CLINICAL DATA: Mental status changes, cause.

EXAM:
MRI HEAD WITHOUT CONTRAST
TECHNIQUE: Multiplanar, multiecho pulse sequences of the brain and surrounding
structures were obtained without intravenous contrast.

[Series 2: DWI · axial · 3.0mm · 0.94mm/px · z∈[-79,+58]mm · 11 of 96 slices shown (1 of 2)]
[im 1/96]
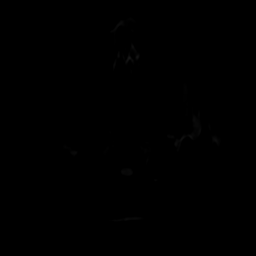
[im 10/96]
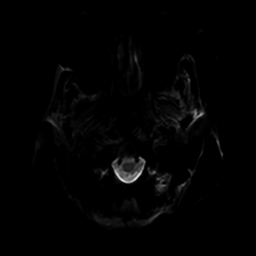
[im 20/96]
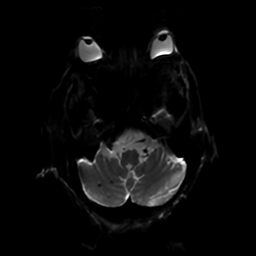
[im 29/96]
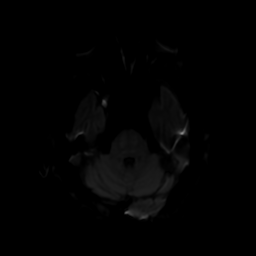
[im 39/96]
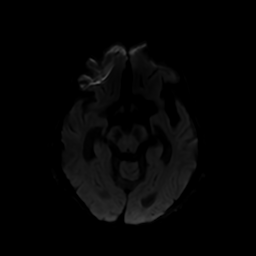
[im 48/96]
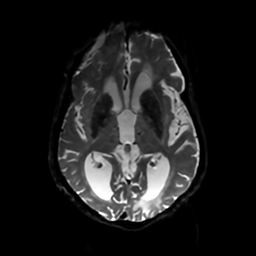
[im 58/96]
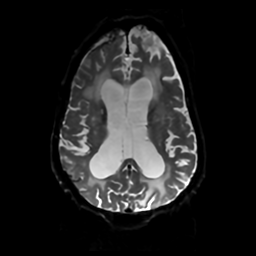
[im 67/96]
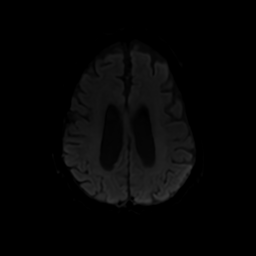
[im 77/96]
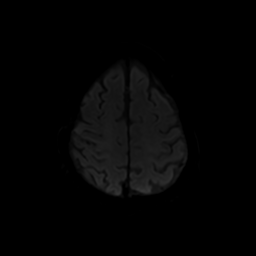
[im 86/96]
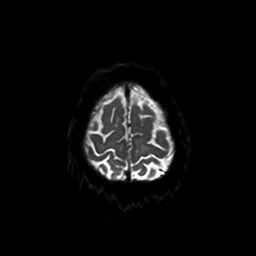
[im 96/96]
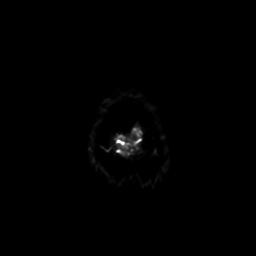

[Series 3: DWI · coronal · 4.0mm · 0.94mm/px · 7 of 74 slices shown (2 of 2)]
[im 1/74]
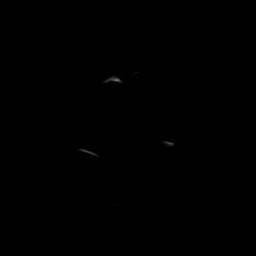
[im 11/74]
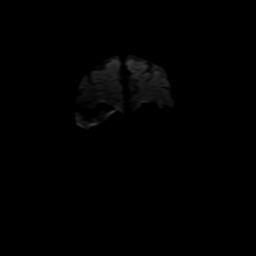
[im 21/74]
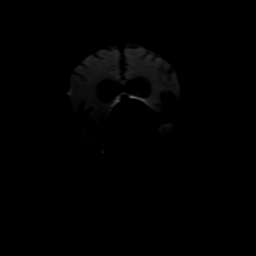
[im 32/74]
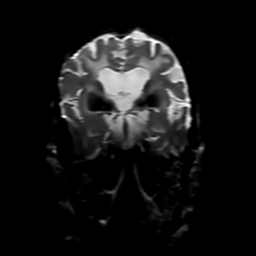
[im 42/74]
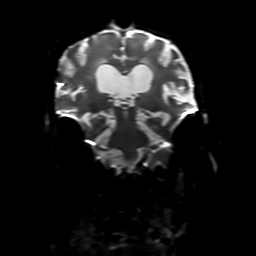
[im 53/74]
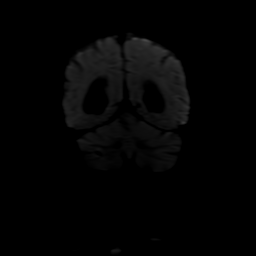
[im 63/74]
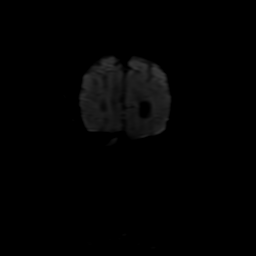

[Series 4: FLAIR · sagittal · 5.0mm · 0.23mm/px · 3 of 23 slices shown (1 of 2)]
[im 1/23]
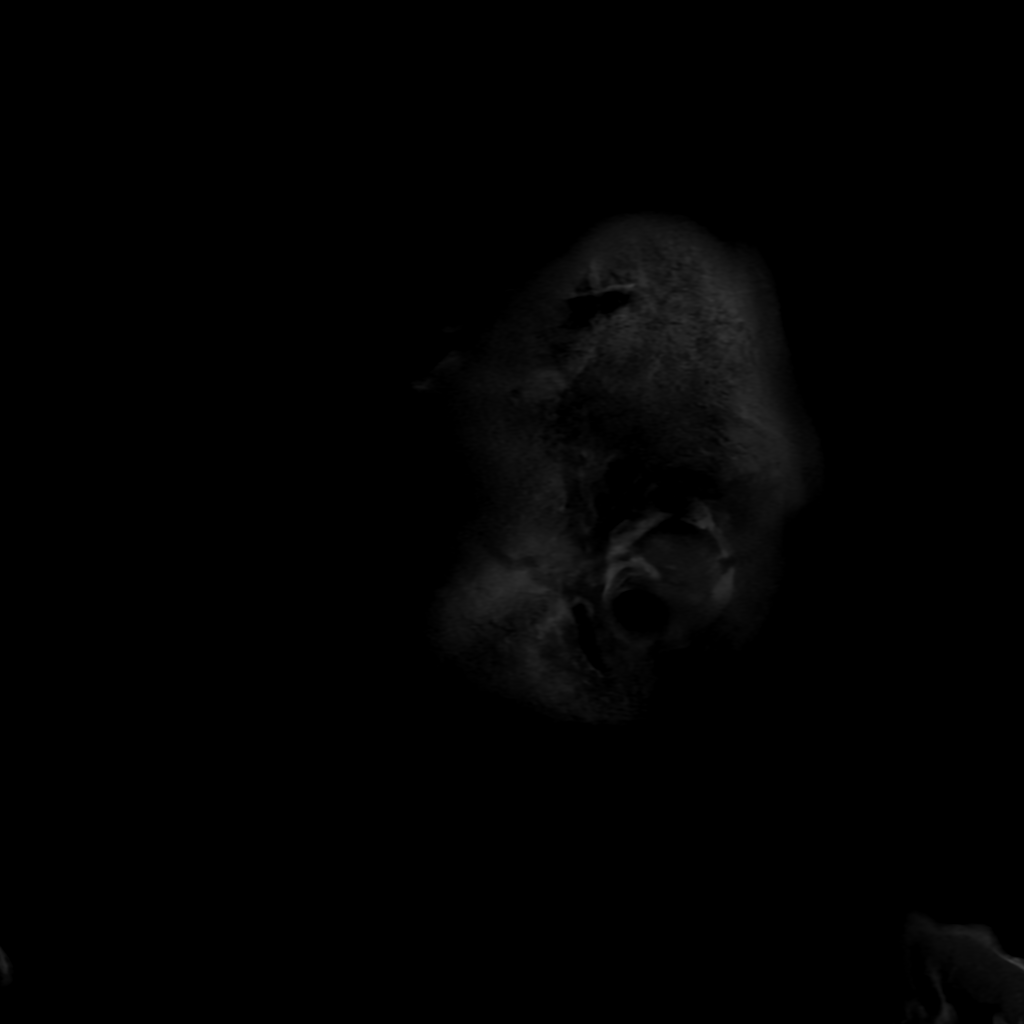
[im 12/23]
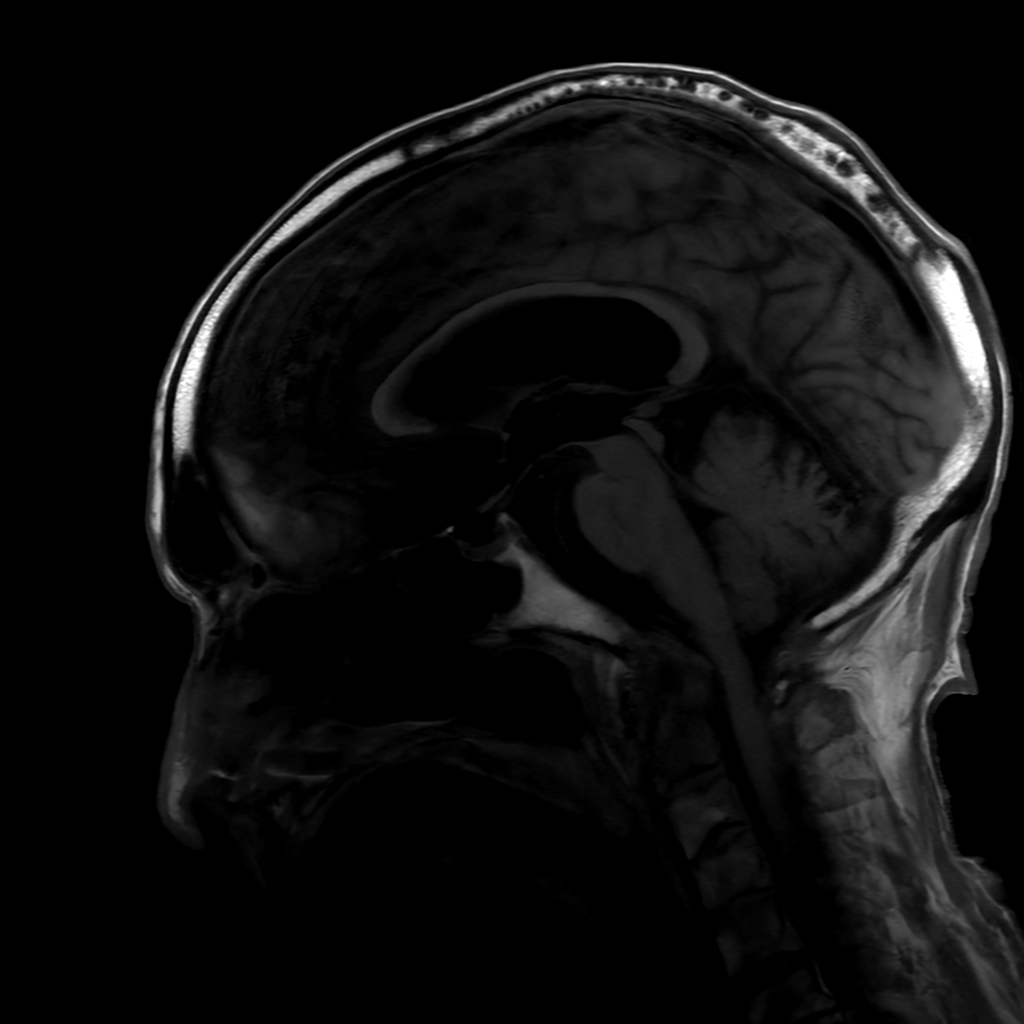
[im 23/23]
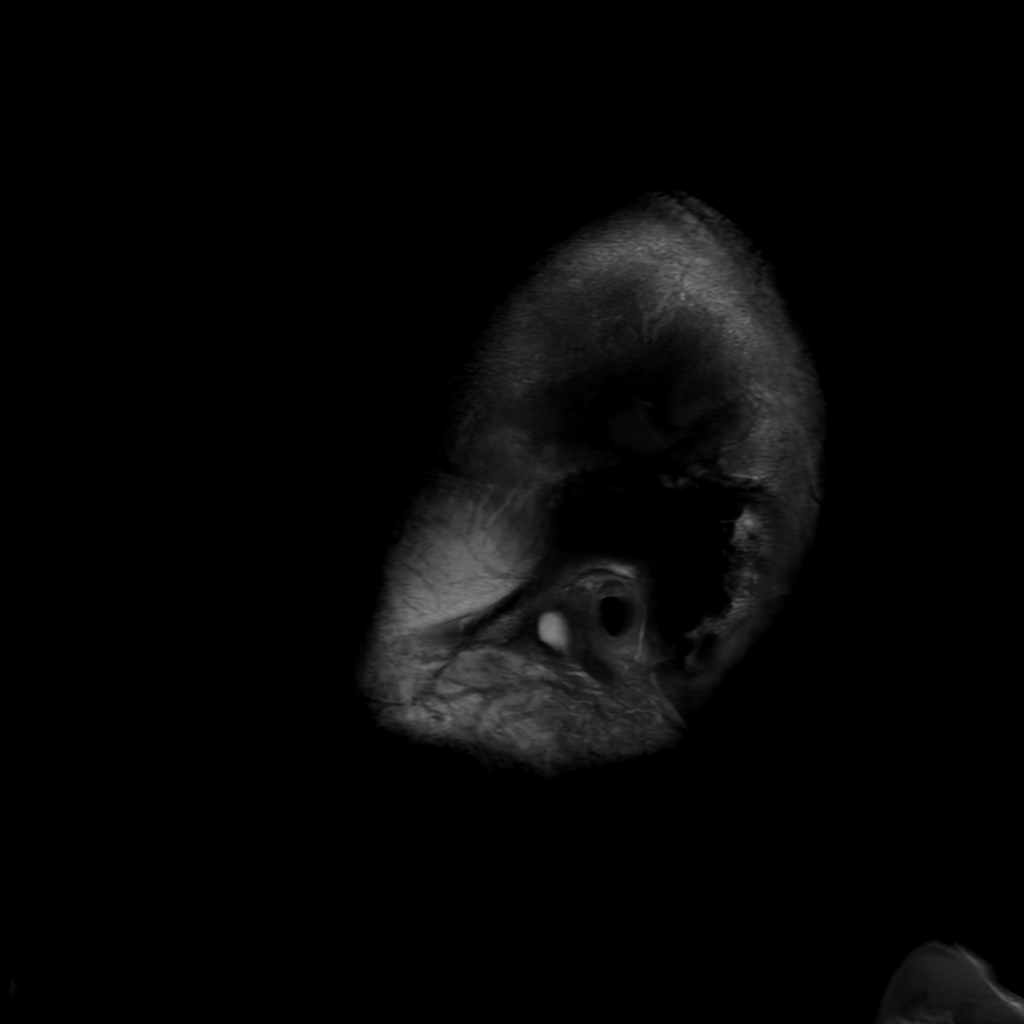

[Series 6: FLAIR · axial · 4.0mm · 0.45mm/px · z∈[-73,+56]mm · 3 of 31 slices shown (2 of 2)]
[im 1/31]
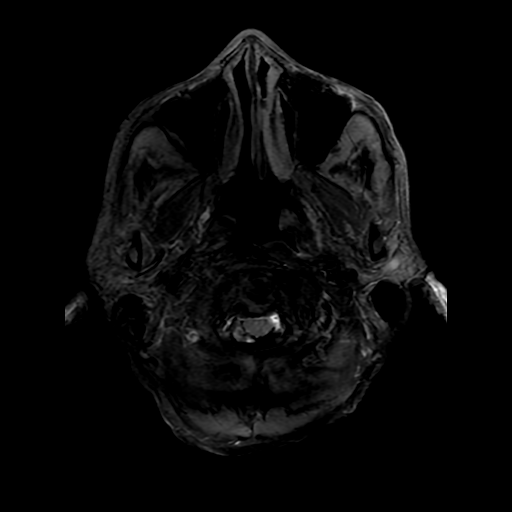
[im 16/31]
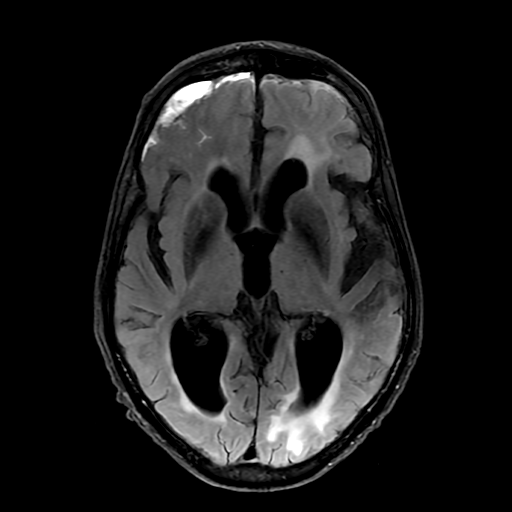
[im 31/31]
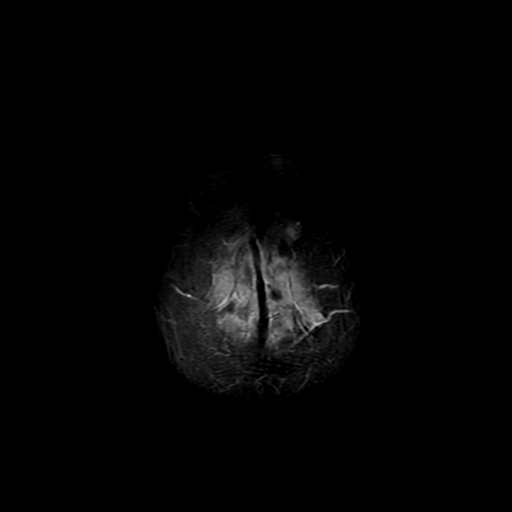

[24 of 48 positions shown; findings below may reference images not displayed]

FINDINGS: Brain: Subacute subdural hematomas in the bilateral anterior frontal
region measuring up to 10 mm on the right and 3 mm on the left,
compared to 10 mm on the right and 5 mm on the left on prior MRI. No
new acute hemorrhage identified. No acute infarct, mass lesion or
midline shift. Prominent supratentorial ventricles is likely related
to central parenchymal volume loss. Scattered and confluent T2
hyperintensity of the white matter of the cerebral hemispheres is
unchanged.

Vascular: Normal flow voids.

Skull and upper cervical spine: Normal marrow signal.

Sinuses/Orbits: Paranasal sinuses are essentially clear. Bilateral
mastoid effusion. The orbits are maintained.

Other: None.
IMPRESSION: 1. Stable bilateral frontal subacute subdural hematoma. No midline
shift.
2. No new intracranial abnormality.
3. Extensive chronic white matter disease and parenchymal volume
loss, unchanged.

## 2021-07-15 IMAGING — CT CT HEAD W/O CM
2 of 5 series · 12 of 47 positions shown, 15 images · non-contrast
Comparison: [DATE].

CLINICAL DATA: Mental status change, persistent or worsening.



[Series 5: head without cor · coronal · non-contrast · 0.34mm/px · 3 of 79 slices shown]
[im 27/79  brain]
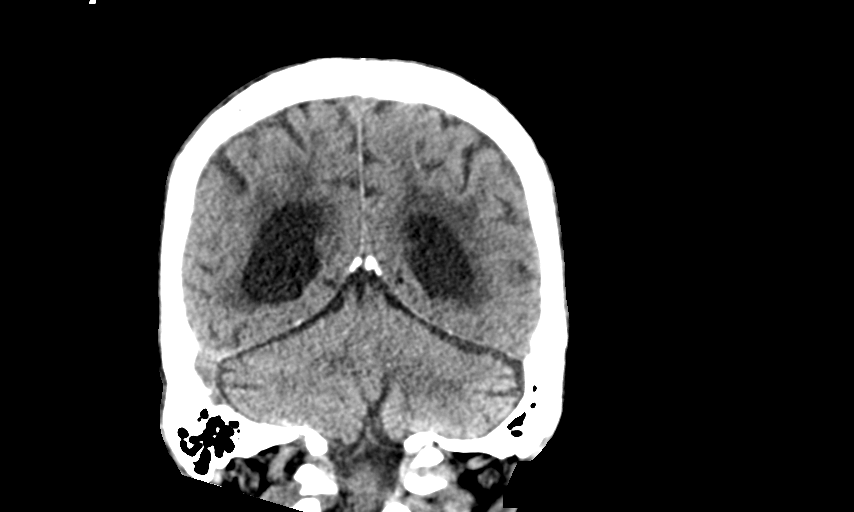
[im 35/79  brain]
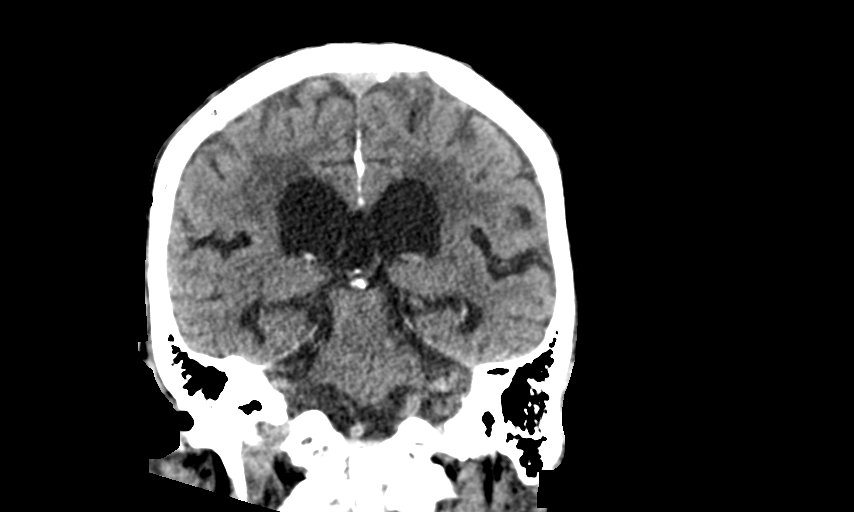
[im 44/79  brain]
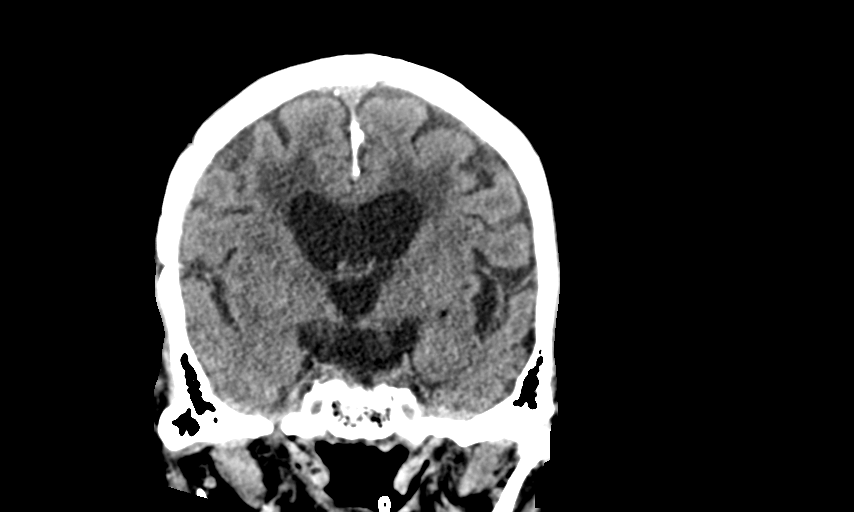

[Series 7: head without axial straight · axial · non-contrast · 0.40mm/px · z∈[+457,+613]mm · 9 of 68 slices shown, 12 images]
[im 7/68  brain]
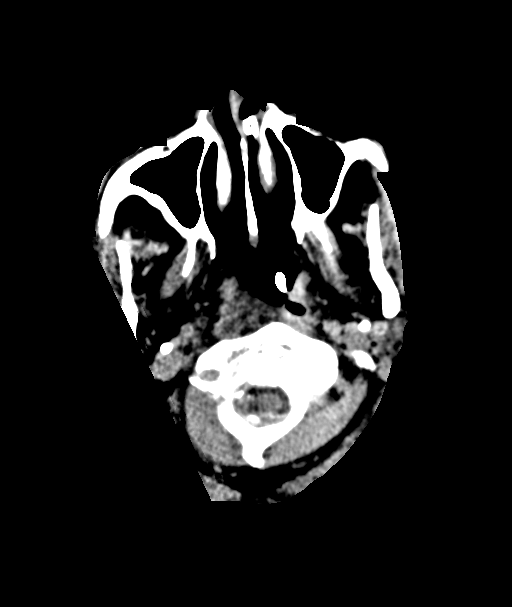
[im 7/68  bone]
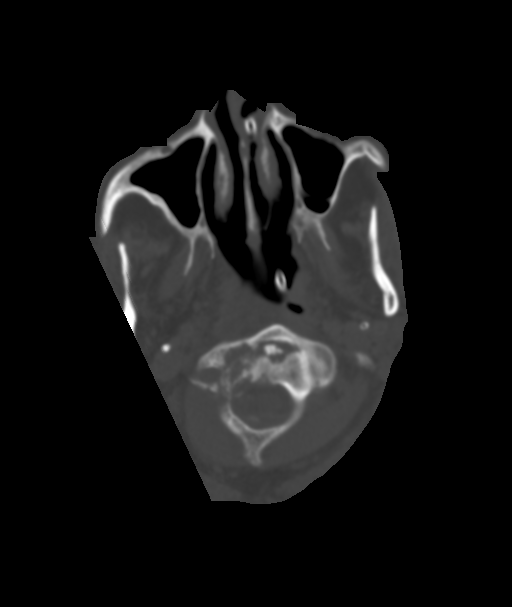
[im 14/68  brain]
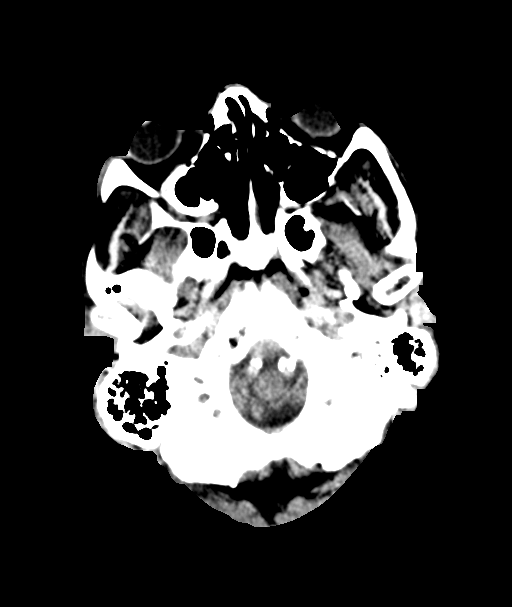
[im 21/68  brain]
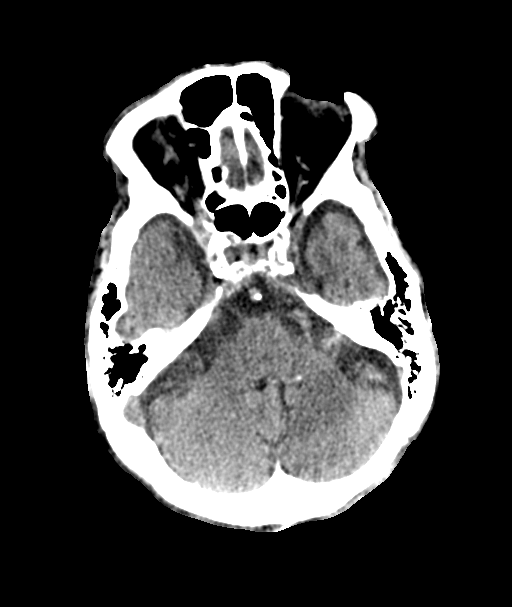
[im 27/68  brain]
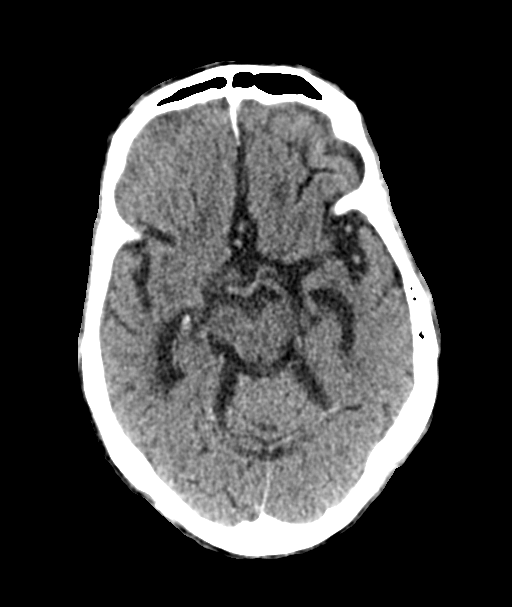
[im 34/68  brain]
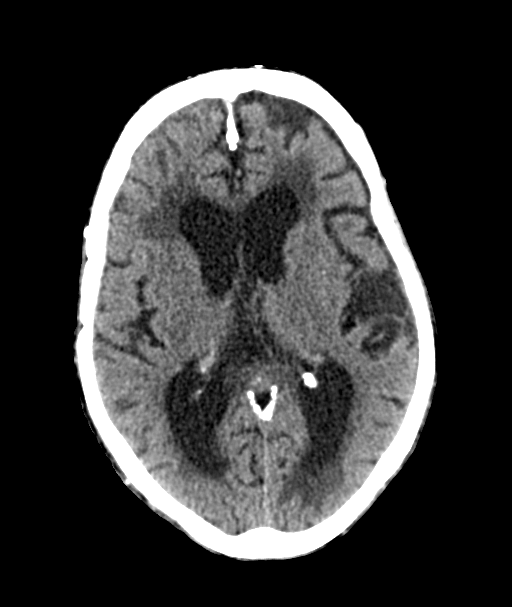
[im 34/68  bone]
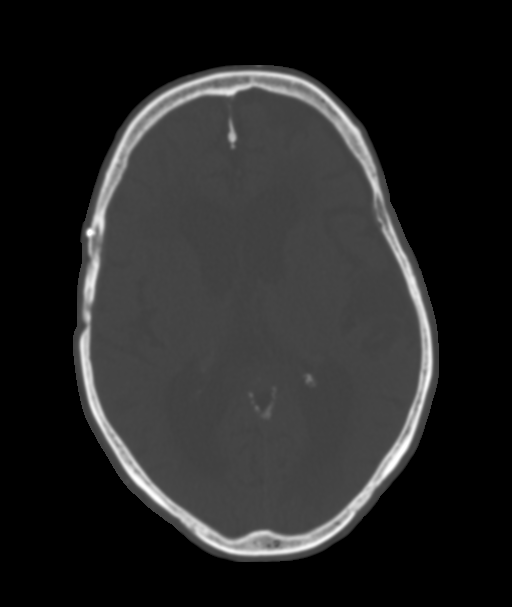
[im 41/68  brain]
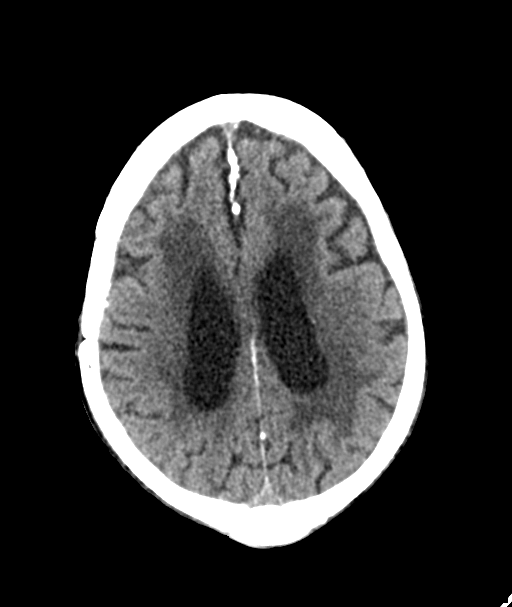
[im 47/68  brain]
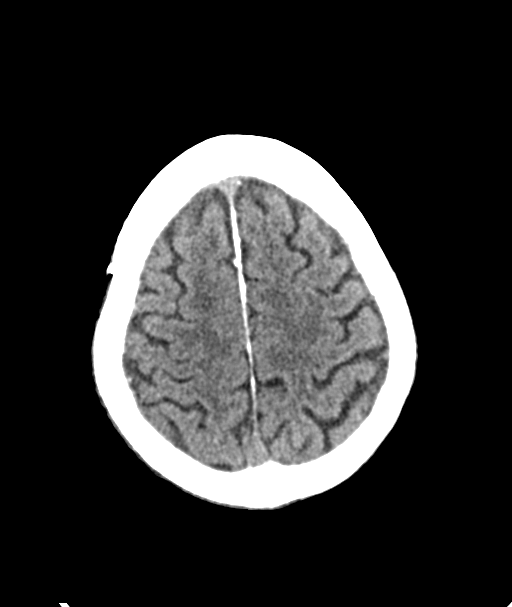
[im 54/68  brain]
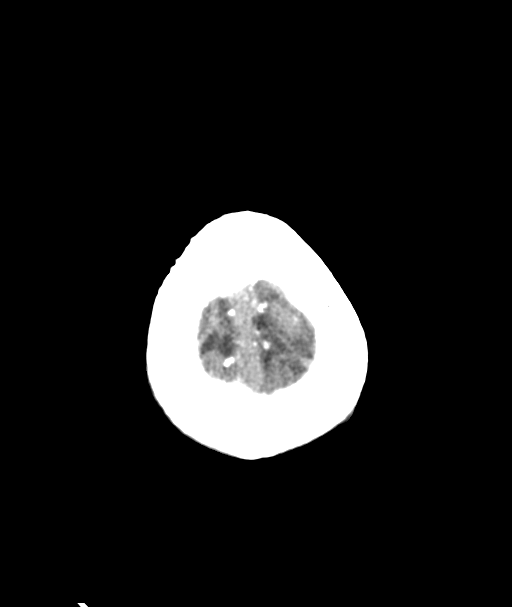
[im 61/68  brain]
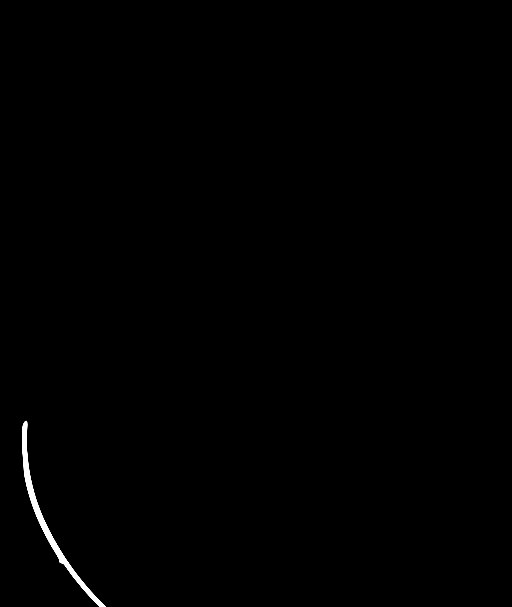
[im 61/68  bone]
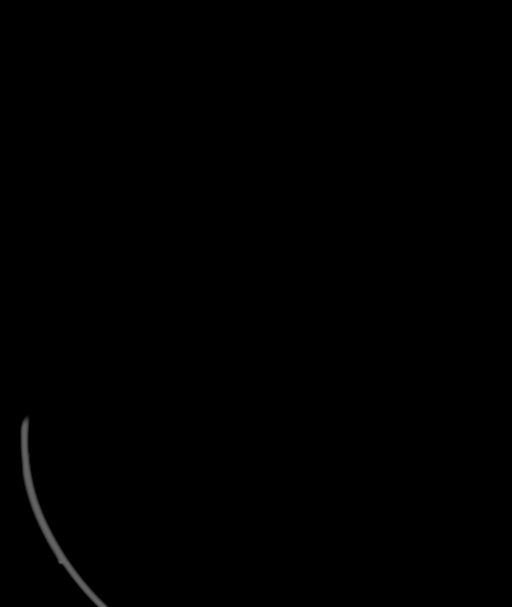

[12 of 47 positions shown; findings below may reference images not displayed]

FINDINGS: Brain: There is redemonstration of small subdural hematomas over the
anterior and inferior frontal lobes bilaterally, greater on the
right than on the left and not significantly changed from the prior
exam. There is mild mass effect on the frontal lobe on the right. No
midline shift is seen. Extensive subcortical and periventricular
white matter hypodensities are present bilaterally. No
hydrocephalus. Mild diffuse atrophy is noted.

Vascular: Atherosclerotic calcification of the vertebral arteries,
basilar artery, and carotid siphons. No hyperdense vessel.

Skull: No acute fracture. Craniotomy changes are present in the
frontal region on the right.

Sinuses/Orbits: No acute finding.

Other: A nasogastric tube is noted.
IMPRESSION: 1. Stable small bilateral subdural hematomas over the anterior and
inferior aspects of the frontal lobes bilaterally not significantly
changed from the prior exam. There is mild mass effect on the
frontal lobe on the right with no midline shift.
2. Atrophy with chronic microvascular ischemic changes.

## 2021-07-15 MED ORDER — THIAMINE HCL 100 MG/ML IJ SOLN
100.0000 mg | Freq: Every day | INTRAMUSCULAR | Status: DC
  Administered 2021-07-16 – 2021-07-25 (×7): 100 mg via INTRAVENOUS
  Filled 2021-07-15 (×8): qty 2

## 2021-07-15 MED ORDER — THIAMINE HCL 100 MG PO TABS
100.0000 mg | ORAL_TABLET | Freq: Every day | ORAL | Status: DC
  Administered 2021-07-17 – 2021-07-27 (×5): 100 mg
  Filled 2021-07-15 (×5): qty 1

## 2021-07-15 MED ORDER — DEXTROSE 50 % IV SOLN
INTRAVENOUS | Status: AC
  Administered 2021-07-15: 25 mL
  Filled 2021-07-15: qty 50

## 2021-07-15 MED ORDER — HYDROCODONE-ACETAMINOPHEN 5-325 MG PO TABS: 1.0000 | ORAL_TABLET | ORAL | Status: DC | PRN

## 2021-07-15 MED ORDER — PROPOFOL 1000 MG/100ML IV EMUL
5.0000 ug/kg/min | INTRAVENOUS | Status: DC
  Administered 2021-07-15: 5 ug/kg/min via INTRAVENOUS
  Administered 2021-07-15: 10 ug/kg/min via INTRAVENOUS
  Filled 2021-07-15 (×3): qty 100

## 2021-07-15 MED ORDER — SODIUM CHLORIDE 0.9 % IV SOLN
250.0000 mg | Freq: Two times a day (BID) | INTRAVENOUS | Status: DC
  Administered 2021-07-16 – 2021-07-17 (×3): 250 mg via INTRAVENOUS
  Filled 2021-07-15 (×4): qty 2.5

## 2021-07-15 MED ORDER — METOCLOPRAMIDE HCL 5 MG PO TABS
5.0000 mg | ORAL_TABLET | Freq: Three times a day (TID) | ORAL | Status: DC | PRN
  Filled 2021-07-15: qty 2

## 2021-07-15 MED ORDER — FENTANYL CITRATE (PF) 100 MCG/2ML IJ SOLN
INTRAMUSCULAR | Status: AC
  Administered 2021-07-15: 50 ug via INTRAVENOUS
  Filled 2021-07-15: qty 2

## 2021-07-15 MED ORDER — ACETAMINOPHEN 325 MG PO TABS
325.0000 mg | ORAL_TABLET | Freq: Four times a day (QID) | ORAL | Status: DC | PRN
  Administered 2021-07-16: 650 mg
  Filled 2021-07-15: qty 2

## 2021-07-15 MED ORDER — MAGNESIUM SULFATE 4 GM/100ML IV SOLN
4.0000 g | Freq: Once | INTRAVENOUS | Status: AC
  Administered 2021-07-15: 4 g via INTRAVENOUS
  Filled 2021-07-15: qty 100

## 2021-07-15 MED ORDER — POLYETHYLENE GLYCOL 3350 17 G PO PACK: 17.0000 g | PACK | Freq: Every day | ORAL | Status: DC | PRN

## 2021-07-15 MED ORDER — ONDANSETRON HCL 4 MG PO TABS: 4.0000 mg | ORAL_TABLET | Freq: Four times a day (QID) | ORAL | Status: DC | PRN

## 2021-07-15 MED ORDER — SENNA 8.6 MG PO TABS
1.0000 | ORAL_TABLET | Freq: Two times a day (BID) | ORAL | Status: DC
  Administered 2021-07-15 – 2021-07-19 (×8): 8.6 mg
  Filled 2021-07-15 (×11): qty 1

## 2021-07-15 MED ORDER — DIPHENHYDRAMINE HCL 12.5 MG/5ML PO ELIX: 12.5000 mg | ORAL_SOLUTION | ORAL | Status: DC | PRN

## 2021-07-15 MED ORDER — ALUM & MAG HYDROXIDE-SIMETH 200-200-20 MG/5ML PO SUSP
30.0000 mL | ORAL | Status: DC | PRN
  Administered 2021-07-19: 30 mL
  Filled 2021-07-15 (×2): qty 30

## 2021-07-15 MED ORDER — SODIUM CHLORIDE 0.9 % IV SOLN
3.0000 g | Freq: Three times a day (TID) | INTRAVENOUS | Status: DC
  Administered 2021-07-15 – 2021-07-16 (×3): 3 g via INTRAVENOUS
  Filled 2021-07-15 (×3): qty 8

## 2021-07-15 MED ORDER — DEXTROSE 5 % IV SOLN
INTRAVENOUS | Status: DC
  Administered 2021-07-15: 75 mL via INTRAVENOUS

## 2021-07-15 MED ORDER — VITAL AF 1.2 CAL PO LIQD
1000.0000 mL | ORAL | Status: DC
  Administered 2021-07-15 – 2021-07-27 (×11): 1000 mL
  Filled 2021-07-15 (×6): qty 1000

## 2021-07-15 MED ORDER — LORAZEPAM 2 MG/ML IJ SOLN
1.0000 mg | INTRAMUSCULAR | Status: DC | PRN
  Administered 2021-07-16: 2 mg via INTRAVENOUS
  Filled 2021-07-15: qty 1

## 2021-07-15 MED ORDER — FENTANYL CITRATE (PF) 100 MCG/2ML IJ SOLN
INTRAMUSCULAR | Status: AC
  Filled 2021-07-15: qty 2

## 2021-07-15 MED ORDER — DEXTROSE 50 % IV SOLN: 12.5000 g | INTRAVENOUS | Status: AC

## 2021-07-15 MED ORDER — FENTANYL CITRATE (PF) 100 MCG/2ML IJ SOLN
50.0000 ug | INTRAMUSCULAR | Status: DC | PRN
  Administered 2021-07-15: 25 ug via INTRAVENOUS
  Filled 2021-07-15: qty 2

## 2021-07-15 MED ORDER — LORAZEPAM 1 MG PO TABS: 1.0000 mg | ORAL_TABLET | ORAL | Status: DC | PRN

## 2021-07-15 MED ORDER — FENTANYL CITRATE (PF) 100 MCG/2ML IJ SOLN
50.0000 ug | INTRAMUSCULAR | Status: DC | PRN
  Filled 2021-07-15: qty 2

## 2021-07-15 MED ORDER — ADULT MULTIVITAMIN W/MINERALS CH
1.0000 | ORAL_TABLET | Freq: Every day | ORAL | Status: DC
  Administered 2021-07-15 – 2021-07-27 (×13): 1
  Filled 2021-07-15 (×13): qty 1

## 2021-07-15 MED ORDER — METOCLOPRAMIDE HCL 5 MG/ML IJ SOLN: 5.0000 mg | Freq: Three times a day (TID) | INTRAMUSCULAR | Status: DC | PRN

## 2021-07-15 MED ORDER — DEXTROSE 50 % IV SOLN
INTRAVENOUS | Status: AC
  Administered 2021-07-15: 12.5 g via INTRAVENOUS
  Filled 2021-07-15: qty 50

## 2021-07-15 MED ORDER — NOREPINEPHRINE 4 MG/250ML-% IV SOLN
0.0000 ug/min | INTRAVENOUS | Status: DC
  Administered 2021-07-15: 18 ug/min via INTRAVENOUS
  Administered 2021-07-15: 20 ug/min via INTRAVENOUS
  Filled 2021-07-15: qty 250

## 2021-07-15 MED ORDER — DEXMEDETOMIDINE HCL IN NACL 400 MCG/100ML IV SOLN: 0.4000 ug/kg/h | INTRAVENOUS | Status: DC

## 2021-07-15 MED ORDER — DEXMEDETOMIDINE HCL IN NACL 400 MCG/100ML IV SOLN
0.4000 ug/kg/h | INTRAVENOUS | Status: DC
  Administered 2021-07-15: 0.4 ug/kg/h via INTRAVENOUS
  Administered 2021-07-16: 0.6 ug/kg/h via INTRAVENOUS
  Administered 2021-07-16: 0.4 ug/kg/h via INTRAVENOUS
  Administered 2021-07-16: 1 ug/kg/h via INTRAVENOUS
  Filled 2021-07-15 (×3): qty 100

## 2021-07-15 MED ORDER — ONDANSETRON HCL 4 MG/2ML IJ SOLN: 4.0000 mg | Freq: Four times a day (QID) | INTRAMUSCULAR | Status: DC | PRN

## 2021-07-15 NOTE — Progress Notes (Signed)
eLink Physician-Brief Progress Note Patient Name: Gary Stevenson DOB: July 22, 1940 MRN: [id number]   Date of Service  07/15/2021  HPI/Events of Note  CT brain and ABG results reviewed.  eICU Interventions  Ventilator rate increased from 24 to 30 to address respiratory acidosis. ABG will be repeated at 3 AM.        Wende Crease U Patrisha Hausmann 07/15/2021, 2:01 AM

## 2021-07-15 NOTE — Progress Notes (Signed)
EEG complete - results pending 

## 2021-07-15 NOTE — Progress Notes (Addendum)
Brief Neuro update:  Exam on propofol 68mcg/kg/min with pupils small round reactive bilaterally, he has weak corneal, weak cough, no gag.  Doll's eye maneuver with eyes moving along with a head.  He has no spontaneous movement in any extremities.  No movement to noxious stimuli in any of the extremities.  Reflexes are 2 in bilateral brachioradialis, biceps, triceps, knees.  Discussed with the PCCM team.  Cerebell EEG did not demonstrate any seizures.  We will get a routine EEG and an MRI brain without contrast.  Recommend weaning sedation as possible.  Recommend continuing Keppra 250 mg twice daily for now.  [place] Pager Number [phone]

## 2021-07-15 NOTE — Progress Notes (Signed)
Subjective: Patient became unresponsive earlier tonight.  He apparently had dropping sats, with concerns about worsening breathing.  He had rhonchorous breath sounds but continued to have oxygen saturations decreasing.  He was transferred to the ICU with poor mental status, and subsequently intubated.  Exam: Vitals:   07/15/21 0115 07/15/21 0130  BP:    Pulse: 86 87  Resp: (!) 24 (!) 21  Temp:    SpO2: 99% 99%   Gen: In bed, NAD Resp: non-labored breathing, no acute distress Abd: soft, nt  Neuro: MS: Does not open eyes or follow commands CN: Pupils equal round and reactive, corneals absent, doll's eye intact, cough absent Motor: Minimal flexion to noxious stimulation in all four extremities Sensory: As above  Pertinent Labs: Cr 1.26 INR 1.2  Ca 7.3, albumin < 1.5 NA 140  ABG 7.176/60.7/59.2 Hgb 9.6   Impression: 81 year old male with acute unresponsiveness in the setting of respiratory failure.  Given his history of seizures, I do think that emergent EEG is necessary, and I will connect a cerebell.  I will also check a phenobarbital level.  I am concerned that he may have had some degree of hypoperfusion injury given the sudden drop in blood pressure in the setting of hypoxemia.   Recommendations: 1) stat cerebell 2) continue phenobarbital 64.8 mg twice daily, check level 3) continue keppra 250mg  BID 4) Consider MRI if no clinical improvement.   Roland Rack, MD [facility] [phone]  If 7pm- 7am, please page neurology on call as listed in [place].

## 2021-07-15 NOTE — Procedures (Signed)
[facility]  Routine EEG Report  Patient Name:   Gary Stevenson, Gary Stevenson Date of Birth:   May 13, 1941 Identification Number:   MRN - [id number]  Date of Study:   07/15/2021 11:08:41  Indication: Encephalopathy,  Technical Summary: A routine 20 channel electroencephalogram using the international 10-20 system of electrode placement was performed.  States       Coma  Abnormalities  Burst Suppression: Burst suppression like pattern with bursts of polymorphic delta/theta activity noted every five seconds.   Activation Procedures  Hyperventilation: Not performed  Photic Stimulation: Not performed  Classification: Abnormal :  Diagnosis: This is a severely abnormal comatose EEG due to burst suppression pattern. Differential includes ICU sedative medication use or anoxia if no sedation. No epileptiform discharges. Clinical correlation is advised.      Dr Kennon Portela   [facility] [phone]  Case [id number]

## 2021-07-15 NOTE — Progress Notes (Signed)
eLink Physician-Brief Progress Note Patient Name: Gary Stevenson DOB: 1940-11-23 MRN: [id number]   Date of Service  07/15/2021  HPI/Events of Note  Patient needs continuous sedation order due to ventilator dyssynchrony.  eICU Interventions  Propofol gtt ordered.        Shariya Gaster U Cristoval Teall 07/15/2021, 6:01 AM

## 2021-07-15 NOTE — Progress Notes (Addendum)
Nutrition Follow-up  DOCUMENTATION CODES:   Severe malnutrition in context of chronic illness, Underweight  INTERVENTION:   Initiate tube feeding via Cortrak tube: Vital AF 1.2 at 20 ml/h, increase by 10 ml every 4 hours to goal rate of 60 ml/h (1440 ml per day)  Provides 1728 kcal, 108 gm protein, 1168 ml free water daily  When IVF discontinued, recommend increase free water flushes to 150 ml every 6 hours.   Continue to monitor mag, phos, and K levels and supplement as needed.   NUTRITION DIAGNOSIS:   Severe Malnutrition related to chronic illness as evidenced by severe fat depletion, severe muscle depletion.  Ongoing   GOAL:   Patient will meet greater than or equal to 90% of their needs  Progressing   MONITOR:   Diet advancement, PO intake, Supplement acceptance, Labs, Weight trends, Skin  REASON FOR ASSESSMENT:   Consult Hip fracture protocol  ASSESSMENT:   81 y.o. male with medical history of alcohol abuse, lung cancer, brain aneurysm, and arthritis. He was admitted due to low hemoglobin with hx of chronic anemia in preparation for elective total R hip replacement surgery.  Discussed patient in ICU rounds and with RN today. S/P intubation 1/17 and bronchoscopy for aspiration pneumonitis and mucus plugging. Found to have copious secretions, similar in appearance to TF material. Questionable emesis and aspiration event.  Cortrak was placed 1/16. Okay to begin TF today.  Receiving 105 ml free water flushes every 6 hours.  OG tube was placed 1/17 for decompression, being removed today.  Weaning off propofol today; starting Precedex. No BM documented this admission, bowel regimen has been initiated.   Patient is currently intubated on ventilator support MV: 13 L/min Temp (24hrs), Avg:95.9 F (35.5 C), Min:92.1 F (33.4 C), Max:98.2 F (36.8 C)  Propofol: 6.2 ml/hr providing 164 kcal from lipid; weaning off today  Labs reviewed. Mag 1.5 CBG:  121-60-98-89-99  Medications reviewed and include vitamin J-69, Colace, folic acid, multivitamin with minerals, Protonix, Miralax, Senokot, thiamine, Keppra, Levophed, propofol. IVF: D5 at 75 ml/h   Diet Order:   Diet Order             Diet NPO time specified  Diet effective now                   EDUCATION NEEDS:   No education needs have been identified at this time  Skin:  Skin Assessment: Skin Integrity Issues: Skin Integrity Issues:: Stage III Stage II: sacrum, scrotum Stage III: sacrum Incisions: closed rt hip  Last BM:  no BM documented  Height:   Ht Readings from Last 1 Encounters:  07/14/21 5\' 5"  (1.651 m)    Weight:   Wt Readings from Last 1 Encounters:  07/15/21 51.9 kg    Ideal Body Weight:  56.8 kg  BMI:  Body mass index is 19.04 kg/m.  Estimated Nutritional Needs:   Kcal:  1600-1800 kcal  Protein:  80-95 grams  Fluid:  >/= 1.7 L/day    Lucas Mallow RD, LDN, CNSC Please refer to Am[place] for contact information.

## 2021-07-15 NOTE — Progress Notes (Signed)
RT transported patient from 2M02 to MRI and back with RN, No complications. RT will continue to monitor.

## 2021-07-15 NOTE — Progress Notes (Signed)
NAME:  Gary Stevenson, MRN:  [id number], DOB:  1940-11-15, LOS: 7 ADMISSION DATE:  07/11/2021, CONSULTATION DATE: 07/14/2021 REFERRING MD:  Marlyce Huge, CHIEF COMPLAINT:  AMS, Respiratory failure   History of Present Illness:  Gary Stevenson is a 81 y.o. M with PMH significant for right frontal subdural hematomas status post surgical evacuation in 2012, lung cancer status post lobectomy, alcohol abuse, CKD who presented to the ED 1/12 after a fall and was found to have R acetabular fx with interval displacement and underwent R total hip arthroplasty on 07/18/2021.  He was later found with decreased responsiveness and seizure activity that lasted for around 1 minute.  He was seen by Neurology and started on Phenobarbital and Keppra with LTM EEG.  Neurology felt seizure was most likely secondary to subdural hematoma and he was gradually improving until 1/17 when he was noted to have worsening mental status again with hypotension.  ABG 7.134 and pCO2 60.7, patient's mental status was not amenable for Bipap, so PCCM consulted for transfer.   Pertinent  Medical History   Past Medical History:  Diagnosis Date   Arthritis    Brain aneurysm    ETOH abuse    intoxicated   Lung cancer ([place])    Significant Hospital Events: Including procedures, antibiotic start and stop dates in addition to other pertinent events   1/11 admission for R hip fx 1/12 R hip arthoplasty 1/13 Sz activity 1/17 worsening mental status, PCCM consult and ICU txfr, intubated  1/18 CT head with stable small bilateral subdural hematomas but no acute intracranial abnormalities. 1/18 EEG >> 1/18 MRI brain >>   Interim History / Subjective:  Overnight rapid EEG showed generalized encephalopathy but no seizure activity. No acute events overnight Patient sedated and intubated this morning.  Objective   Blood pressure 131/84, pulse 85, temperature 97.6 F (36.4 C), temperature source Axillary, resp. rate 18, height _0  (1.651 m),  weight 51.9 kg, SpO2 99 %.    Vent Mode: PRVC FiO2 (%):  [100 %] 100 % Set Rate:  [24 bmp-30 bmp] 30 bmp Vt Set:  [490 mL] 490 mL PEEP:  [5 cmH20] 5 cmH20 Plateau Pressure:  [27 cmH20] 27 cmH20   Intake/Output Summary (Last 24 hours) at 07/15/2021 0720 Last data filed at 07/15/2021 0500 Gross per 24 hour  Intake 2443.04 ml  Output 475 ml  Net 1968.04 ml   Filed Weights   07/06/2021 1700 07/04/2021 1129 07/15/21 0343  Weight: 45.5 kg 45.5 kg 51.9 kg    Examination: General: Chronically ill, frail elderly man intubated.  NAD HENT: ET tube in place Lungs: CTAB.  Mechanical lung sounds.  Diminished lung sounds on the right.  No wheezing or rales. Cardiovascular: RRR.  No murmurs rubs or gallops Abdomen: Soft.  Nondistended.  Normal BS Extremities: Warm and dry.  Well perfused. Neuro: Sedated and unresponsive.  Labs reviewed: Mag 1.5, creatinine 1.24, bicarb 17 WBC 2.7, Hgb 9.7 CBGs 65, 121, 60, 98  Resolved Hospital Problem list   AKI Hypernatremia Assessment & Plan:  Acute metabolic encephalopathy Chronic subdural hematoma History of seizure Rapid EEG overnight did not show any seizure activity.  CT head with no acute intracranial abnormalities. Continues to remain on sedation and AEDs.  No leukocytosis or fevers.  Magnesium remains low at 1.5.  --Neuro following, appreciate recs --Routine EEG today --Pending brain MRI --Start Precedex today after MRI, wean off propofol  Acute hypoxic hypercarbic respiratory failure Respiratory acidosis with metabolic acidosis Aspiration pneumonitis Respiratory  acidosis resolved after increasing respiratory rate.  BAL culture showing some gram-negative rods.  Blood cultures are no growth. --Continue full vent, PRVC --Continue Unasyn --Repeat ABG tomorrow --Monitor WBC, fever curve --VAP bundle  AKI on CKD stage IIIa None anion gap metabolic acidosis Creatinine back to normal limits at 1.24.  Sodium 141, K+ 4.2. --Continue IV  fluid --Keep free water for now -- Strict I&O's  Close displaced fracture of the right femoral neck Status post right hip total arthroplasty on 1/12 by orthopedic surgery. --Continue pain control  Severe protein caloric malnutrition Failure to thrive Hypoglycemia Patient with intermittent hypoglycemic episodes during hospitalization.  On tube feeds.  Mag low at about 1.5.  Normal phos.  --Trend electrolytes, repleting magnesium --Continue tube feed --Start D5 LR --Monitor CBGs   Best Practice (right click and "Reselect all SmartList Selections" daily)   Diet/type: NPO DVT prophylaxis: systemic heparin GI prophylaxis: PPI Lines: Central line and Arterial Line Foley:  Yes, and it is still needed Code Status:  full code Last date of multidisciplinary goals of care discussion [1/18, spoke to daughter at bedside]  Labs   CBC: Recent Labs  Lab 07/12/21 0135 07/13/21 0019 07/14/21 0210 07/14/21 2245 07/14/21 2326 07/15/21 0208 07/15/21 0308  WBC 11.3* 10.9* 9.4 7.3  --  2.7*  --   HGB 9.7* 9.5* 10.5* 9.6* 9.9* 9.7* 9.2*  HCT 28.7* 29.3* 32.9* 30.3* 29.0* 29.7* 27.0*  MCV 88.0 89.6 90.1 92.1  --  90.5  --   PLT 307 317 352 326  --  287  --     Basic Metabolic Panel: Recent Labs  Lab 07/10/21 0325 07/11/21 0248 07/11/21 0744 07/12/21 0135 07/12/21 1118 07/13/21 0019 07/14/21 0210 07/14/21 2245 07/14/21 2326 07/15/21 0208 07/15/21 0308  NA 137 144   < > 146*  --  147* 145 140 144 141 144  K 5.4* 4.2   < > 4.5  --  4.3 4.5 4.4 4.5 4.2 4.2  CL 105 117*   < > 119*  --  121* 122* 118*  --  119*  --   CO2 18* 18*   < > 15*  --  16* 18* 18*  --  17*  --   GLUCOSE 134* 77   < > 60*  --  82 126* 145*  --  89  --   BUN 73* 56*   < > 45*  --  37* 28* 28*  --  28*  --   CREATININE 1.65* 1.64*   < > 1.51*  --  1.44* 1.27* 1.26*  --  1.24  --   CALCIUM 7.6* 7.2*   < > 7.7*  --  8.1* 7.7* 7.3*  --  7.2*  --   MG 1.9 1.9  --   --   --  1.9 1.6*  --   --  1.5*  --   PHOS   --  4.0  --   --  3.8 3.3 2.5  --   --  4.2  --    < > = values in this interval not displayed.   GFR: Estimated Creatinine Clearance: 34.9 mL/min (by C-G formula based on SCr of 1.24 mg/dL). Recent Labs  Lab 07/13/21 0019 07/14/21 0210 07/14/21 2245 07/14/21 2320 07/15/21 0145 07/15/21 0208  WBC 10.9* 9.4 7.3  --   --  2.7*  LATICACIDVEN  --   --   --  0.9 1.2  --     Liver Function Tests: Recent  Labs  Lab 06/30/2021 0658 07/11/21 0248 07/11/21 0632 07/11/21 0744 07/14/21 2245  AST _0 ALT _1 ALKPHOS 167* 97 100 101 183*  BILITOT 0.8 0.4 0.5 0.5 0.7  PROT 7.7 5.1* 5.1* 5.2* 6.0*  ALBUMIN 2.3* 1.5* 1.5* 1.5* <1.5*   No results for input(s): LIPASE, AMYLASE in the last 168 hours. No results for input(s): AMMONIA in the last 168 hours.  ABG    Component Value Date/Time   PHART 7.287 (L) 07/15/2021 0308   PCO2ART 35.2 07/15/2021 0308   PO2ART 64 (L) 07/15/2021 0308   HCO3 16.9 (L) 07/15/2021 0308   TCO2 18 (L) 07/15/2021 0308   ACIDBASEDEF 9.0 (H) 07/15/2021 0308   O2SAT 90.0 07/15/2021 0308     Coagulation Profile: Recent Labs  Lab 07/14/21 2245  INR 1.2    Cardiac Enzymes: No results for input(s): CKTOTAL, CKMB, CKMBINDEX, TROPONINI in the last 168 hours.  HbA1C: No results found for: HGBA1C  CBG: Recent Labs  Lab 07/14/21 1607 07/14/21 2237 07/15/21 0308 07/15/21 0342 07/15/21 0704  GLUCAP 163* 148* 65* 121* 60*   Critical care time: 30

## 2021-07-15 NOTE — Procedures (Addendum)
History: 81 yo M with unresponsiveness.   Sedation: Propofol 5 mcg/kg/min  Technique: This EEG was obtained using a 10 lead EEG system positioned circumferentially without any parasagittal coverage (rapid EEG). Computer selected EEG is reviewed as  well as background features and all clinically significant events.  This report represents from onset of recording at 1:59 AM until 6:25 AM.  Background: The background consists of low voltage irregular slow activity with superimposed beta range activities. Sleep spindles are seen at times.  No epileptiform activity is seen.    Photic stimulation: Physiologic driving is not performed  EEG Abnormalities: 1) generalized irregular slow activity  Clinical Interpretation: This EEG is consistent with a generalized nonspecific cerebral dysfunction (encephalopathy).    There was no seizure or seizure predisposition recorded on this study. Please note that lack of epileptiform activity on EEG does not preclude the possibility of epilepsy.   Roland Rack, MD [facility] [phone]  If 7pm- 7am, please page neurology on call as listed in [place].

## 2021-07-15 NOTE — Progress Notes (Signed)
eLink Physician-Brief Progress Note Patient Name: Gary Stevenson DOB: June 24, 1941 MRN: [id number]   Date of Service  07/15/2021  HPI/Events of Note  Repeat ABG shows a significant improvement.  eICU Interventions  No intervention.        [name] 07/15/2021, 3:20 AM

## 2021-07-16 ENCOUNTER — Inpatient Hospital Stay (HOSPITAL_COMMUNITY): Payer: Medicare HMO

## 2021-07-16 DIAGNOSIS — E43 Unspecified severe protein-calorie malnutrition: Secondary | ICD-10-CM

## 2021-07-16 DIAGNOSIS — G931 Anoxic brain damage, not elsewhere classified: Secondary | ICD-10-CM

## 2021-07-16 DIAGNOSIS — E162 Hypoglycemia, unspecified: Secondary | ICD-10-CM

## 2021-07-16 DIAGNOSIS — Z7189 Other specified counseling: Secondary | ICD-10-CM

## 2021-07-16 LAB — BASIC METABOLIC PANEL
Anion gap: 6 (ref 5–15)
BUN: 29 mg/dL — ABNORMAL HIGH (ref 8–23)
CO2: 16 mmol/L — ABNORMAL LOW (ref 22–32)
Calcium: 7.1 mg/dL — ABNORMAL LOW (ref 8.9–10.3)
Chloride: 113 mmol/L — ABNORMAL HIGH (ref 98–111)
Creatinine, Ser: 1.54 mg/dL — ABNORMAL HIGH (ref 0.61–1.24)
GFR, Estimated: 45 mL/min — ABNORMAL LOW (ref >60)
Glucose, Bld: 104 mg/dL — ABNORMAL HIGH (ref 70–99)
Potassium: 4.4 mmol/L (ref 3.5–5.1)
Sodium: 135 mmol/L (ref 135–145)

## 2021-07-16 LAB — POCT I-STAT 7, (LYTES, BLD GAS, ICA,H+H)
Acid-base deficit: 10 mmol/L — ABNORMAL HIGH (ref 0.0–2.0)
Bicarbonate: 16.2 mmol/L — ABNORMAL LOW (ref 20.0–28.0)
Calcium, Ion: 1.15 mmol/L (ref 1.15–1.40)
HCT: 25 % — ABNORMAL LOW (ref 39.0–52.0)
Hemoglobin: 8.5 g/dL — ABNORMAL LOW (ref 13.0–17.0)
O2 Saturation: 96 %
Patient temperature: 100.4
Potassium: 4.6 mmol/L (ref 3.5–5.1)
Sodium: 138 mmol/L (ref 135–145)
TCO2: 17 mmol/L — ABNORMAL LOW (ref 22–32)
pCO2 arterial: 35 mmHg (ref 32.0–48.0)
pH, Arterial: 7.278 — ABNORMAL LOW (ref 7.350–7.450)
pO2, Arterial: 99 mmHg (ref 83.0–108.0)

## 2021-07-16 LAB — CBC
HCT: 26.3 % — ABNORMAL LOW (ref 39.0–52.0)
Hemoglobin: 8.8 g/dL — ABNORMAL LOW (ref 13.0–17.0)
MCH: 29.7 pg (ref 26.0–34.0)
MCHC: 33.5 g/dL (ref 30.0–36.0)
MCV: 88.9 fL (ref 80.0–100.0)
Platelets: 243 10*3/uL (ref 150–400)
RBC: 2.96 MIL/uL — ABNORMAL LOW (ref 4.22–5.81)
RDW: 17.5 % — ABNORMAL HIGH (ref 11.5–15.5)
WBC: 14.2 10*3/uL — ABNORMAL HIGH (ref 4.0–10.5)
nRBC: 0.4 % — ABNORMAL HIGH (ref 0.0–0.2)

## 2021-07-16 LAB — GLUCOSE, CAPILLARY
Glucose-Capillary: 87 mg/dL (ref 70–99)
Glucose-Capillary: 68 mg/dL — ABNORMAL LOW (ref 70–99)
Glucose-Capillary: 97 mg/dL (ref 70–99)
Glucose-Capillary: 100 mg/dL — ABNORMAL HIGH (ref 70–99)
Glucose-Capillary: 120 mg/dL — ABNORMAL HIGH (ref 70–99)
Glucose-Capillary: 101 mg/dL — ABNORMAL HIGH (ref 70–99)
Glucose-Capillary: 72 mg/dL (ref 70–99)

## 2021-07-16 LAB — MAGNESIUM: Magnesium: 2.3 mg/dL (ref 1.7–2.4)

## 2021-07-16 LAB — PHOSPHORUS: Phosphorus: 3.4 mg/dL (ref 2.5–4.6)

## 2021-07-16 IMAGING — DX DG CHEST 1V PORT
1 series · 2 of 2 positions shown · non-contrast
Comparison: Portable chest [DATE] and earlier.

CLINICAL DATA: 80-year-old male with respiratory failure, hypoxia.
Recent hip replacement, subdural hematoma.

EXAM:
PORTABLE CHEST 1 VIEW

[Series 1: chest · 0.14mm/px · 2 of 2 slices shown]
[im 1/2]
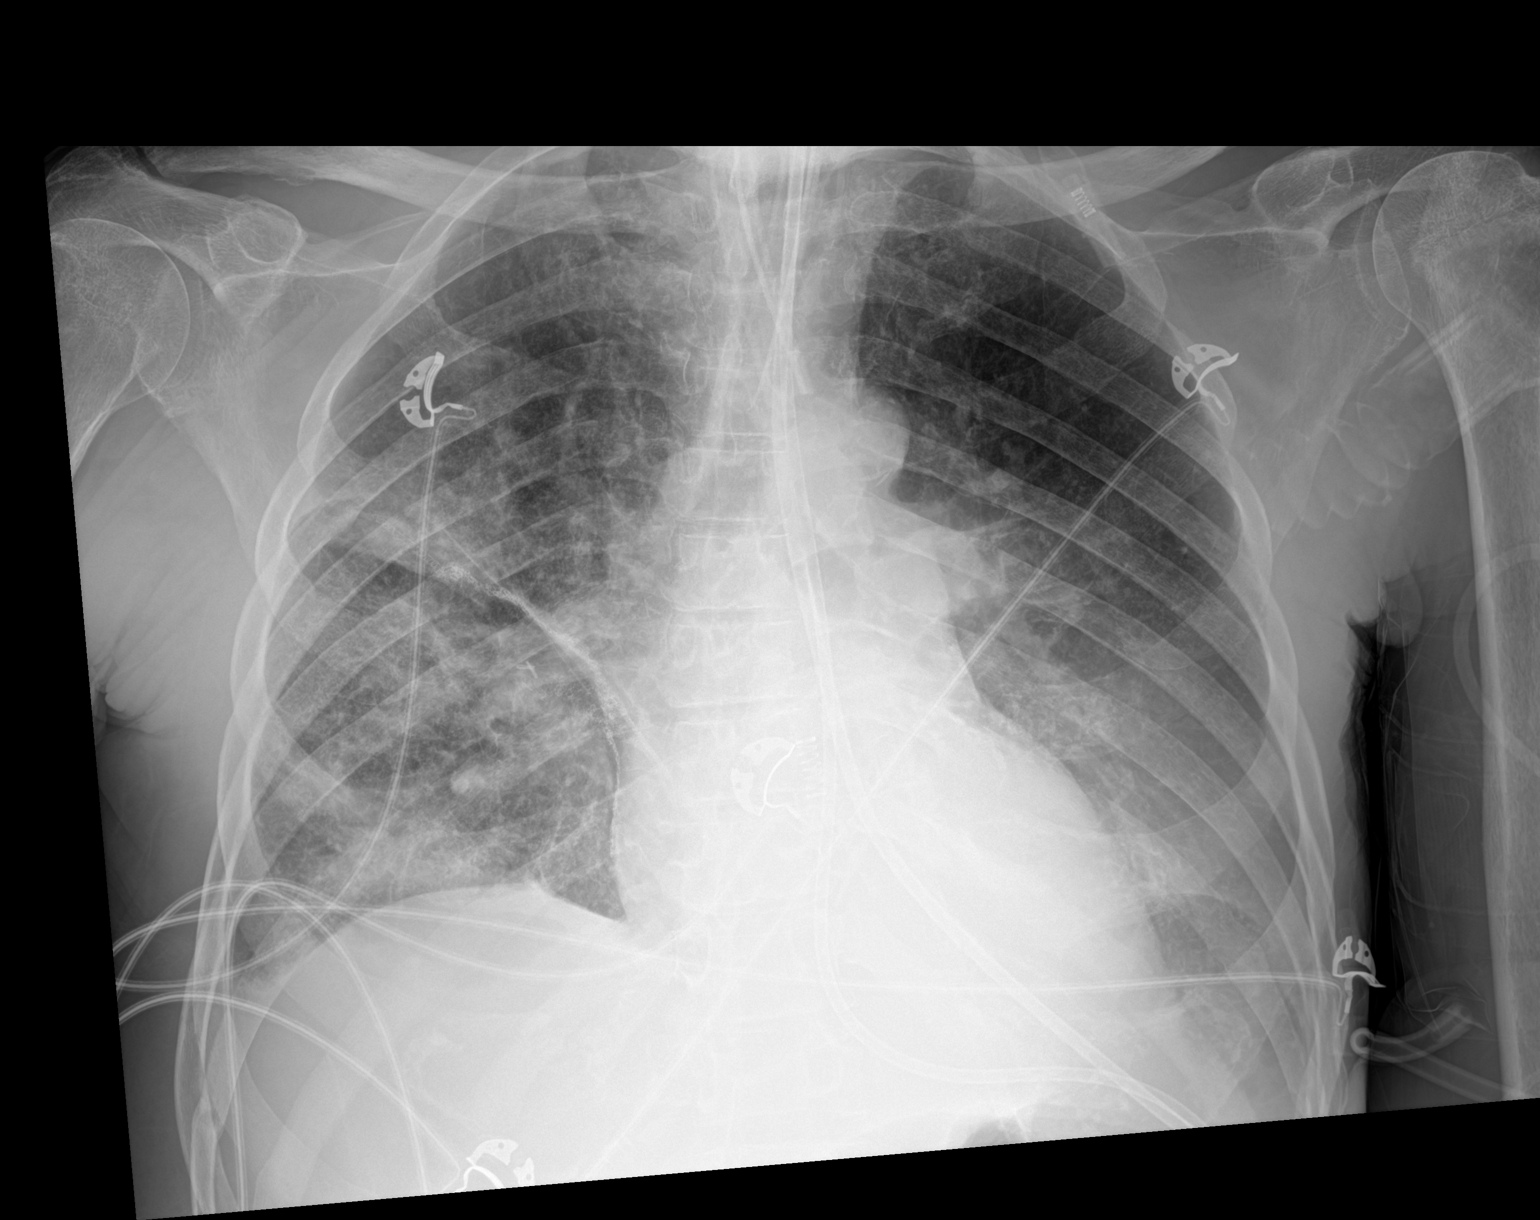
[im 2/2]
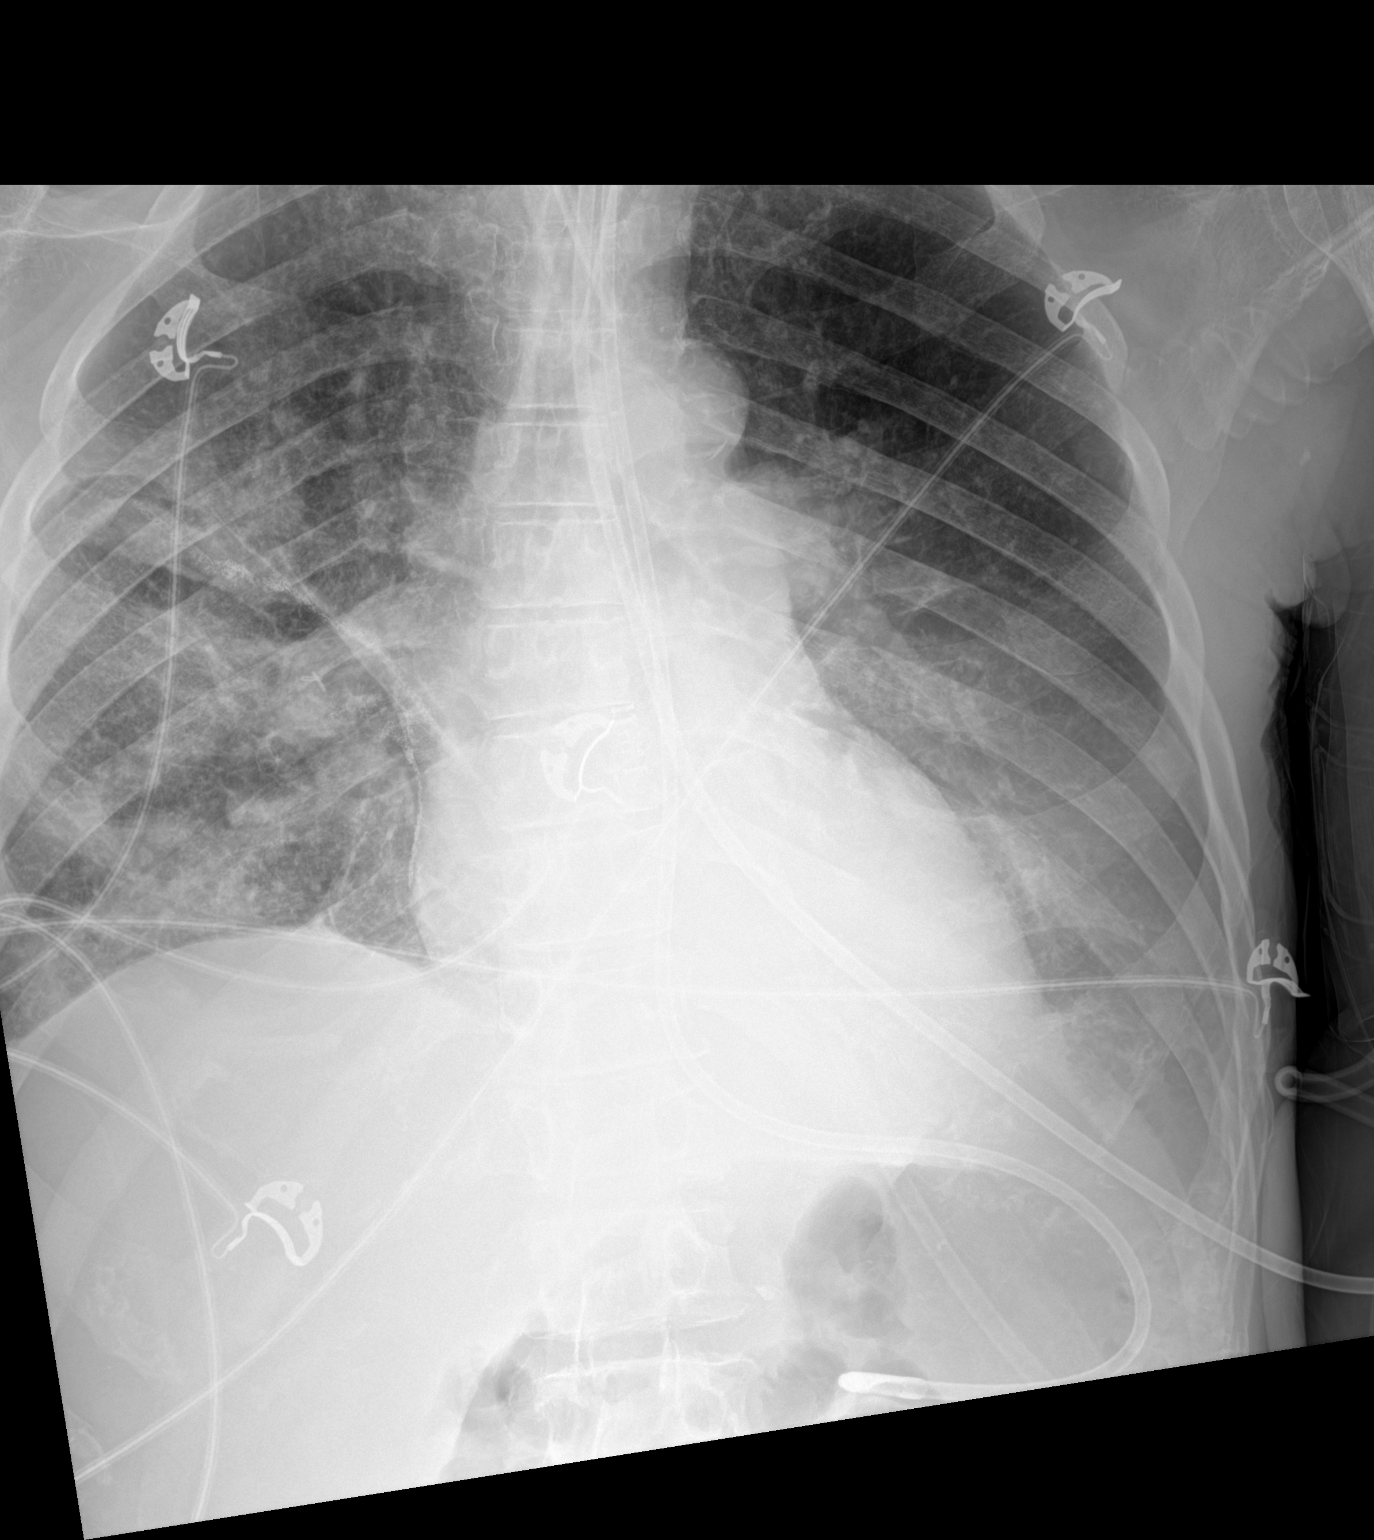

[2 of 2 positions shown; findings below may reference images not displayed]

FINDINGS: Portable AP semi upright view at [YT] hours. ET tube and enteric
feeding tube appear stable and satisfactory. Ongoing dense
retrocardiac opacity on the left compatible with lower lobe collapse
or consolidation. Substantially regressed right upper lobe collapse
since [YT] hours yesterday, but patchy residual right upper and
lower lung opacity. No pneumothorax. No definite pleural effusion.
Stable visualized osseous structures.
IMPRESSION: 1. Stable lines and tubes.
2. Largely resolved right upper lobe collapse since [YT] hours
yesterday. But ongoing patchy multifocal right lung opacity
suspicious for multilobar pneumonia, and continued left lower lobe
collapse or consolidation.

## 2021-07-16 MED ORDER — DEXTROSE 50 % IV SOLN
INTRAVENOUS | Status: AC
  Filled 2021-07-16: qty 50

## 2021-07-16 MED ORDER — SODIUM BICARBONATE 650 MG PO TABS
650.0000 mg | ORAL_TABLET | Freq: Two times a day (BID) | ORAL | Status: DC
  Administered 2021-07-16: 650 mg via ORAL
  Filled 2021-07-16 (×2): qty 1

## 2021-07-16 MED ORDER — NOREPINEPHRINE 16 MG/250ML-% IV SOLN
0.0000 ug/min | INTRAVENOUS | Status: DC
  Administered 2021-07-16: 28 ug/min via INTRAVENOUS
  Administered 2021-07-16: 7 ug/min via INTRAVENOUS
  Administered 2021-07-16: 13 ug/min via INTRAVENOUS
  Filled 2021-07-16 (×2): qty 250

## 2021-07-16 MED ORDER — DEXTROSE 50 % IV SOLN
12.5000 g | INTRAVENOUS | Status: AC
  Administered 2021-07-16: 12.5 g via INTRAVENOUS

## 2021-07-16 MED ORDER — SODIUM BICARBONATE 650 MG PO TABS
650.0000 mg | ORAL_TABLET | Freq: Two times a day (BID) | ORAL | Status: DC
  Administered 2021-07-16: 650 mg

## 2021-07-16 MED ORDER — SODIUM CHLORIDE 0.9 % IV SOLN
2.0000 g | INTRAVENOUS | Status: DC
  Administered 2021-07-16 – 2021-07-17 (×2): 2 g via INTRAVENOUS
  Filled 2021-07-16 (×2): qty 2

## 2021-07-16 MED ORDER — BISACODYL 10 MG RE SUPP
10.0000 mg | Freq: Once | RECTAL | Status: AC
  Administered 2021-07-16: 10 mg via RECTAL
  Filled 2021-07-16: qty 1

## 2021-07-16 MED ORDER — MIDODRINE HCL 5 MG PO TABS
5.0000 mg | ORAL_TABLET | Freq: Three times a day (TID) | ORAL | Status: DC
  Administered 2021-07-16 (×2): 5 mg via ORAL
  Filled 2021-07-16 (×2): qty 1

## 2021-07-16 MED ORDER — MIDODRINE HCL 5 MG PO TABS
5.0000 mg | ORAL_TABLET | Freq: Three times a day (TID) | ORAL | Status: DC
  Filled 2021-07-16: qty 1

## 2021-07-16 MED ORDER — SODIUM CHLORIDE 0.9 % IV SOLN: 3.0000 g | Freq: Two times a day (BID) | INTRAVENOUS | Status: DC

## 2021-07-16 NOTE — Progress Notes (Signed)
PT Cancellation Note  Patient Details Name: Gary Stevenson MRN: [id number] DOB: 10/27/1940   Cancelled Treatment:    Reason Eval/Treat Not Completed: Patient not medically ready (medical decline with intubation since limited eval)   Kaileb Monsanto B Lorik Guo 07/16/2021, 7:34 AM Bayard Males, PT Acute Rehabilitation Services Pager: [phone] Office: [phone]

## 2021-07-16 NOTE — Progress Notes (Signed)
NAME:  Gary Stevenson, MRN:  [id number], DOB:  December 31, 1940, LOS: 8 ADMISSION DATE:  07/01/2021, CONSULTATION DATE: 07/14/2021 REFERRING MD:  Marlyce Huge, CHIEF COMPLAINT:  AMS, Respiratory failure   History of Present Illness:  Gary Stevenson is a 81 y.o. M with PMH significant for right frontal subdural hematomas status post surgical evacuation in 2012, lung cancer status post lobectomy, alcohol abuse, CKD who presented to the ED 1/12 after a fall and was found to have R acetabular fx with interval displacement and underwent R total hip arthroplasty on 07/23/2021.  He was later found with decreased responsiveness and seizure activity that lasted for around 1 minute.  He was seen by Neurology and started on Phenobarbital and Keppra with LTM EEG.  Neurology felt seizure was most likely secondary to subdural hematoma and he was gradually improving until 1/17 when he was noted to have worsening mental status again with hypotension.  ABG 7.134 and pCO2 60.7, patient's mental status was not amenable for Bipap, so PCCM consulted for transfer.   Pertinent  Medical History   Past Medical History:  Diagnosis Date   Arthritis    Brain aneurysm    ETOH abuse    intoxicated   Lung cancer ([place])    Significant Hospital Events: Including procedures, antibiotic start and stop dates in addition to other pertinent events   1/11 admission for R hip fx 1/12 R hip arthoplasty 1/13 Sz activity 1/17 worsening mental status, PCCM consult and ICU txfr, intubated  1/18 CT head with stable small bilateral subdural hematomas but no acute intracranial abnormalities. 1/18 EEG >> abnormal comatose EEG, no epileptiform discharges 1/18 MRI brain >> stable bilateral frontal subacute subdural hematoma.  No new intracranial abnormalities. 1/19 cXR >> stable multilobar pneumonia, resolved RUL collapse, continued LLL consolidation/collapse   Interim History / Subjective:  No acute events overnight Mild fever of 100.4  overnight Episode of hypoglycemic to 68 this morning Remains unresponsive  Objective   Blood pressure 102/70, pulse 89, temperature (!) 100.4 F (38 C), temperature source Oral, resp. rate 20, height _0  (1.651 m), weight 53.1 kg, SpO2 100 %.    Vent Mode: PRVC FiO2 (%):  [50 %-100 %] 50 % Set Rate:  [30 bmp] 30 bmp Vt Set:  [490 mL] 490 mL PEEP:  [5 cmH20] 5 cmH20 Plateau Pressure:  [25 cmH20-27 cmH20] 26 cmH20   Intake/Output Summary (Last 24 hours) at 07/16/2021 0726 Last data filed at 07/16/2021 0600 Gross per 24 hour  Intake 4111.47 ml  Output 420 ml  Net 3691.47 ml   Filed Weights   07/12/2021 1129 07/15/21 0343 07/16/21 0343  Weight: 45.5 kg 51.9 kg 53.1 kg    Examination: General: Chronically ill, frail elderly man intubated.  NAD HENT: ET tube in place.  Pupils sluggish to light. Lungs: CTAB.  Diminished lung sounds. Cardiovascular: RRR.  No murmurs rubs or gallops Abdomen: Soft.  Nondistended.  Normal BS Extremities: Warm and dry.  Mild pitting edema on RUE, RLE.  Skin: Right groin incision site C/D/L with some edema around surgical site. Neuro: Sedated with eyes closed.  No purposeful response to verbal or physical stimuli.   Labs reviewed: CBGs 175,10,25,85 ABG: 7.28/35/99/16.2 WBC 2.7->14.2 Hgb 9.2->8.8 Sodium 135, creatinine 1.54, bicarb 16, mag 2.3  Resolved Hospital Problem list   AKI Hypernatremia Respiratory acidosis Assessment & Plan:  Acute metabolic encephalopathy Chronic subdural hematoma History of seizure No significant change in mental status.  Repeat brain MRI does not show any acute  intracranial abnormalities.  Routine EEG shows generalized encephalopathy but no seizure activity.  Clinical presentation still concerning for anoxic brain injury. --Neuro following, appreciate rec --Start overnight EEG --Wean off Precedex, start prn fentanyl  Acute hypoxic hypercarbic respiratory failure Aspiration pneumonitis Mild fever of 100.4  overnight with worsening leukocytosis. Blood cultures still no growth after 24 hours. BAL culture grew Enterobacter cloacae which is likely to produce AmpC.  Susceptible to multiple agents such as cefepime, Zosyn, Bactrim, imipenem.  Okay to switch to cefepime but can de-escalate to ceftriaxone if AmpC production is unlikely. --Continue full vent, FiO2 down to 50% --Switch antibiotics to cephalosporin later today. --Monitor WBC, fever curve --VAP bundle  Septic shock Patient remained pressor dependent with SBP in the 90s to 110s.  Plan to start midodrine and wean off Levophed as tolerated by BP. --Start midodrine 5 mg 3 times daily --Wean off Levophed, MAP goal >65  AKI on CKD stage IIIa None anion gap metabolic acidosis Creatinine slightly up trended to 1.54.  Sodium 135, K+ 4.4.  Metabolic acidosis slightly worse today.  Minimal UOP overnight.  We will closely monitor renal function. --Discontinue free water --Start bicarb 650 mg BID --Strict I&O's  Close displaced fracture of the right femoral neck Status post right hip total arthroplasty on 1/12 by orthopedic surgery.  Surgical site with mild edema covered with dressing. C/d/l --Continue pain control  Severe protein caloric malnutrition Failure to thrive Hypoglycemia Patient with intermittent hypoglycemic episodes during hospitalization.  On tube feeds.  All electrolytes within normal limits.  Episode of hypoglycemia this morning. --Advance tube feed to goal --Closely monitor CBGs --Continue on D5 LR _0 /hr   Best Practice (right click and "Reselect all SmartList Selections" daily)   Diet/type: NPO DVT prophylaxis: systemic heparin GI prophylaxis: PPI Lines: Central line and Arterial Line Foley:  Yes, and it is still needed Code Status:  full code Last date of multidisciplinary goals of care discussion [1/18, spoke to daughter at bedside]  Labs   CBC: Recent Labs  Lab 07/13/21 0019 07/14/21 0210 07/14/21 2245  07/14/21 2326 07/15/21 2536 07/15/21 0308 07/16/21 0203 07/16/21 0429  WBC 10.9* 9.4 7.3  --  2.7*  --  14.2*  --   HGB 9.5* 10.5* 9.6* 9.9* 9.7* 9.2* 8.8* 8.5*  HCT 29.3* 32.9* 30.3* 29.0* 29.7* 27.0* 26.3* 25.0*  MCV 89.6 90.1 92.1  --  90.5  --  88.9  --   PLT 317 352 326  --  287  --  243  --     Basic Metabolic Panel: Recent Labs  Lab 07/11/21 0248 07/11/21 0744 07/12/21 1118 07/13/21 0019 07/14/21 0210 07/14/21 2245 07/14/21 2326 07/15/21 0208 07/15/21 0308 07/16/21 0203 07/16/21 0429  NA 144   < >  --  147* 145 140 144 141 144 135 138  K 4.2   < >  --  4.3 4.5 4.4 4.5 4.2 4.2 4.4 4.6  CL 117*   < >  --  121* 122* 118*  --  119*  --  113*  --   CO2 18*   < >  --  16* 18* 18*  --  17*  --  16*  --   GLUCOSE 77   < >  --  82 126* 145*  --  89  --  104*  --   BUN 56*   < >  --  37* 28* 28*  --  28*  --  29*  --   CREATININE 1.64*   < >  --  1.44* 1.27* 1.26*  --  1.24  --  1.54*  --   CALCIUM 7.2*   < >  --  8.1* 7.7* 7.3*  --  7.2*  --  7.1*  --   MG 1.9  --   --  1.9 1.6*  --   --  1.5*  --  2.3  --   PHOS 4.0  --  3.8 3.3 2.5  --   --  4.2  --  3.4  --    < > = values in this interval not displayed.   GFR: Estimated Creatinine Clearance: 28.7 mL/min (A) (by C-G formula based on SCr of 1.54 mg/dL (H)). Recent Labs  Lab 07/14/21 0210 07/14/21 2245 07/14/21 2320 07/15/21 0145 07/15/21 0208 07/16/21 0203  WBC 9.4 7.3  --   --  2.7* 14.2*  LATICACIDVEN  --   --  0.9 1.2  --   --     Liver Function Tests: Recent Labs  Lab 07/11/21 0248 07/11/21 0632 07/11/21 0744 07/14/21 2245  AST _0 ALT _1 ALKPHOS 97 100 101 183*  BILITOT 0.4 0.5 0.5 0.7  PROT 5.1* 5.1* 5.2* 6.0*  ALBUMIN 1.5* 1.5* 1.5* <1.5*   No results for input(s): LIPASE, AMYLASE in the last 168 hours. No results for input(s): AMMONIA in the last 168 hours.  ABG    Component Value Date/Time   PHART 7.278 (L) 07/16/2021 0429   PCO2ART 35.0 07/16/2021 0429   PO2ART 99  07/16/2021 0429   HCO3 16.2 (L) 07/16/2021 0429   TCO2 17 (L) 07/16/2021 0429   ACIDBASEDEF 10.0 (H) 07/16/2021 0429   O2SAT 96.0 07/16/2021 0429     Coagulation Profile: Recent Labs  Lab 07/14/21 2245  INR 1.2    Cardiac Enzymes: No results for input(s): CKTOTAL, CKMB, CKMBINDEX, TROPONINI in the last 168 hours.  HbA1C: No results found for: HGBA1C  CBG: Recent Labs  Lab 07/15/21 1528 07/15/21 1918 07/15/21 2307 07/16/21 0308 07/16/21 0723  GLUCAP 124* 84 104* 87 68*   Critical care time: 30

## 2021-07-16 NOTE — Progress Notes (Signed)
OT Cancellation Note  Patient Details Name: Gary Stevenson MRN: [id number] DOB: [birth year]   Cancelled Treatment:    Reason Eval/Treat Not Completed: Patient not medically ready Pt with medical decline, transfer to ICU. Intubated and started on pressors. Will hold therapy attempts today and follow-up tomorrow.   Layla Maw 07/16/2021, 8:09 AM

## 2021-07-16 NOTE — Progress Notes (Signed)
LTM hook up. [facility] notified. Test button was tested.

## 2021-07-16 NOTE — Consult Note (Addendum)
WOC Nurse re-consult Note: Reason for Consult: Requested to assess sacrum/buttocks/right hip. WOC performed consult on 1/16; refer to progress notes for assessment and plan of care for Stage 2 pressure injuries to sacrum/buttocks and posterior scrotum.  Appearance and size is unchanged from previous consult and topical treatment orders have been provided for bedside nurses to perfrom to protect and promote healing. Pt is now critically ill and has large amt edema to scrotum.  He is on a low airloss mattress to reduce pressure. Right hip with staples dry and intact and well approximated from previous surgery; refer to ortho team from notes on 1/12.  He has developed full thickness tissue loss to the area surrounding the staples; approx 4X4X.1cm, red and moist.  Appearance is consistent with previous blisters which have ruptured and peeled and evolved into a full thickness wound. Please refer to ortho team for further questions.  Pressure Injury POA: Yes Dressing procedure/placement/frequency: Topical treatment orders provided for bedside nurses to perform as follows:  1. Apply Aquacel AG post op dressing (one left at bedside, more can be ordered from materials) to right hip post-op incision and change Q Thurs or PRN soiling. 2. Foam dressing to sacrum/buttocks, change Q 3 days or PRN soiling. 3. Barrier cream to posterior scrotum; apply with each turning or cleaning episode. Please re-consult if further assistance is needed.  Thank-you,  Julien Girt MSN, [place], [place], [place], [place]

## 2021-07-16 NOTE — Progress Notes (Incomplete)
NEUROLOGY CONSULTATION PROGRESS NOTE   Date of service: July 16, 2021 Patient Name: Gary Stevenson MRN:  [id number] DOB:  [birth year]  Interval Hx   Hypoglycemic and was given some dextrose.  Vitals   Vitals:   07/16/21 1545 07/16/21 1600 07/16/21 1700 07/16/21 1800  BP:  106/70 109/74 90/64  Pulse: 76 72 91 81  Resp: (!) 24 (!) 24 (!) 25 (!) 26  Temp:      TempSrc:      SpO2: 100% 99% 100% 99%  Weight:      Height:         Body mass index is 19.48 kg/m.  Physical Exam   General: Laying comfortably in bed; in no acute distress. Appears  HENT: Normal oropharynx and mucosa. Normal external appearance of ears and nose. Neck: Supple, no pain or tenderness CV: No JVD. No peripheral edema. Pulmonary: Symmetric Chest rise. Normal respiratory effort. Abdomen: Soft to touch, non-tender. Ext: No cyanosis, edema, or deformity *** Skin: No rash. Normal palpation of skin.  *** Musculoskeletal: Normal digits and nails by inspection. No clubbing. ***  Neurologic Examination  Mental status/Cognition: Alert, oriented to self, place, month and year, good attention. *** Speech/language: Fluent, comprehension intact, object naming intact, repetition intact. *** Cranial nerves:   CN II Pupils equal and reactive to light, no VF deficits ***   CN III,IV,VI EOM intact, no gaze preference or deviation, no nystagmus ***   CN V normal sensation in V1, V2, and V3 segments bilaterally ***   CN VII no asymmetry, no nasolabial fold flattening ***   CN VIII normal hearing to speech ***   CN IX & X normal palatal elevation, no uvular deviation ***   CN XI 5/5 head turn and 5/5 shoulder shrug bilaterally ***   CN XII midline tongue protrusion ***   Motor:  Muscle bulk: ***, tone ***, pronator drift *** tremor *** Mvmt Root Nerve  Muscle Right Left Comments  SA C5/6 Ax Deltoid     EF C5/6 Mc Biceps     EE C6/7/8 Rad Triceps     WF C6/7 Med FCR     WE C7/8 PIN ECU     F Ab C8/T1 U ADM/FDI      HF L1/2/3 Fem Illopsoas     KE L2/3/4 Fem Quad     DF L4/5 D Peron Tib Ant     PF S1/2 Tibial Grc/Sol      Reflexes:  Right Left Comments  Pectoralis      Biceps (C5/6)     Brachioradialis (C5/6)      Triceps (C6/7)      Patellar (L3/4)      Achilles (S1)      Hoffman      Plantar     Jaw jerk    Sensation:  Light touch    Pin prick    Temperature    Vibration   Proprioception    Coordination/Complex Motor:  - Finger to Nose *** - Heel to shin *** - Rapid alternating movement *** - Gait: Stride length ***. Arm swing ***. Base width ***  Labs   Basic Metabolic Panel:  Lab Results  Component Value Date   NA 138 07/16/2021   K 4.6 07/16/2021   CO2 16 (L) 07/16/2021   GLUCOSE 104 (H) 07/16/2021   BUN 29 (H) 07/16/2021   CREATININE 1.54 (H) 07/16/2021   CALCIUM 7.1 (L) 07/16/2021   GFRNONAA 45 (L) 07/16/2021   GFRAA >90  03/17/2014   HbA1c: No results found for: HGBA1C LDL: No results found for: [facility] Urine Drug Screen:     Component Value Date/Time   LABOPIA NONE DETECTED 03/17/2014 1034   COCAINSCRNUR NONE DETECTED 03/17/2014 1034   LABBENZ NONE DETECTED 03/17/2014 1034   AMPHETMU NONE DETECTED 03/17/2014 1034   THCU NONE DETECTED 03/17/2014 1034   LABBARB NONE DETECTED 03/17/2014 1034    Alcohol Level     Component Value Date/Time   ETH <10 05/06/2021 1505   No results found for: PHENYTOIN, ZONISAMIDE, LAMOTRIGINE, LEVETIRACETA No results found for: PHENYTOIN, PHENOBARB, VALPROATE, CBMZ  Imaging and Diagnostic studies  MR BRAIN WO CONTRAST 1. Stable bilateral frontal subacute subdural hematoma. No midline shift. 2. No new intracranial abnormality. 3. Extensive chronic white matter disease and parenchymal volume loss, unchanged.  Impression   Gary Stevenson is a 81 y.o. male with PMH significant for ***. His neurologic examination is notable for ***.  Recommendations   *** ______________________________________________________________________   Thank you for the opportunity to take part in the care of this patient. If you have any further questions, please contact the neurology consultation attending.  Signed,  [place] Pager Number [phone]

## 2021-07-16 NOTE — Progress Notes (Signed)
Pharmacy Antibiotic Note  Gary Stevenson is a 81 y.o. male admitted on 07/27/2021 with aspiration pneumonia.  Pharmacy has been consulted for Cefepime dosing.  WBC 14.2, T 100.33F Received 2 days of Unasyn for empiric coverage of aspiration pneumonia.  Now growing Enterobacter cloacae. Will switch to cefepime due to AmpC-mediated resistance.   Plan: Stop Unasyn Start cefepime 2 gm IV every 24 hours Watch renal function and adjust dosing as necessary Monitor clinical status   Height: 5\' 5"  (165.1 cm) Weight: 53.1 kg (117 lb 1 oz) IBW/kg (Calculated) : 61.5  Temp (24hrs), Avg:96.3 F (35.7 C), Min:92.1 F (33.4 C), Max:100.4 F (38 C)  Recent Labs  Lab 07/13/21 0019 07/14/21 0210 07/14/21 2245 07/14/21 2320 07/15/21 0145 07/15/21 0208 07/16/21 0203  WBC 10.9* 9.4 7.3  --   --  2.7* 14.2*  CREATININE 1.44* 1.27* 1.26*  --   --  1.24 1.54*  LATICACIDVEN  --   --   --  0.9 1.2  --   --      Estimated Creatinine Clearance: 28.7 mL/min (A) (by C-G formula based on SCr of 1.54 mg/dL (H)).    No Known Allergies  Antimicrobials this admission: Unasyn 1/17 >>   Microbiology results: 1/17 TA: enterobacter cloacae (R:cefazolin) 1/17 BCx: NGTD   Thank you for allowing pharmacy to be a part of this patients care.  Laurey Arrow, PharmD PGY1 Pharmacy Resident 07/16/2021  11:35 AM  Please check [email or web address] for unit-specific pharmacy phone numbers.

## 2021-07-17 ENCOUNTER — Inpatient Hospital Stay (HOSPITAL_COMMUNITY): Payer: Medicare HMO

## 2021-07-17 LAB — BASIC METABOLIC PANEL
Anion gap: 7 (ref 5–15)
BUN: 40 mg/dL — ABNORMAL HIGH (ref 8–23)
CO2: 14 mmol/L — ABNORMAL LOW (ref 22–32)
Calcium: 7.1 mg/dL — ABNORMAL LOW (ref 8.9–10.3)
Chloride: 108 mmol/L (ref 98–111)
Creatinine, Ser: 1.93 mg/dL — ABNORMAL HIGH (ref 0.61–1.24)
GFR, Estimated: 35 mL/min — ABNORMAL LOW (ref >60)
Glucose, Bld: 126 mg/dL — ABNORMAL HIGH (ref 70–99)
Potassium: 4.6 mmol/L (ref 3.5–5.1)
Sodium: 129 mmol/L — ABNORMAL LOW (ref 135–145)

## 2021-07-17 LAB — POCT I-STAT 7, (LYTES, BLD GAS, ICA,H+H)
Acid-base deficit: 13 mmol/L — ABNORMAL HIGH (ref 0.0–2.0)
Acid-base deficit: 10 mmol/L — ABNORMAL HIGH (ref 0.0–2.0)
Bicarbonate: 14.9 mmol/L — ABNORMAL LOW (ref 20.0–28.0)
Bicarbonate: 17.2 mmol/L — ABNORMAL LOW (ref 20.0–28.0)
Calcium, Ion: 1.19 mmol/L (ref 1.15–1.40)
Calcium, Ion: 1.17 mmol/L (ref 1.15–1.40)
HCT: 30 % — ABNORMAL LOW (ref 39.0–52.0)
HCT: 31 % — ABNORMAL LOW (ref 39.0–52.0)
Hemoglobin: 10.2 g/dL — ABNORMAL LOW (ref 13.0–17.0)
Hemoglobin: 10.5 g/dL — ABNORMAL LOW (ref 13.0–17.0)
O2 Saturation: 72 %
O2 Saturation: 90 %
Patient temperature: 97.4
Patient temperature: 98.2
Potassium: 5.2 mmol/L — ABNORMAL HIGH (ref 3.5–5.1)
Potassium: 4.9 mmol/L (ref 3.5–5.1)
Sodium: 134 mmol/L — ABNORMAL LOW (ref 135–145)
Sodium: 133 mmol/L — ABNORMAL LOW (ref 135–145)
TCO2: 16 mmol/L — ABNORMAL LOW (ref 22–32)
TCO2: 19 mmol/L — ABNORMAL LOW (ref 22–32)
pCO2 arterial: 39.7 mmHg (ref 32.0–48.0)
pCO2 arterial: 41.7 mmHg (ref 32.0–48.0)
pH, Arterial: 7.178 — CL (ref 7.350–7.450)
pH, Arterial: 7.223 — ABNORMAL LOW (ref 7.350–7.450)
pO2, Arterial: 45 mmHg — ABNORMAL LOW (ref 83.0–108.0)
pO2, Arterial: 68 mmHg — ABNORMAL LOW (ref 83.0–108.0)

## 2021-07-17 LAB — GLUCOSE, CAPILLARY
Glucose-Capillary: 110 mg/dL — ABNORMAL HIGH (ref 70–99)
Glucose-Capillary: 122 mg/dL — ABNORMAL HIGH (ref 70–99)
Glucose-Capillary: 130 mg/dL — ABNORMAL HIGH (ref 70–99)
Glucose-Capillary: 118 mg/dL — ABNORMAL HIGH (ref 70–99)
Glucose-Capillary: 112 mg/dL — ABNORMAL HIGH (ref 70–99)
Glucose-Capillary: 87 mg/dL (ref 70–99)

## 2021-07-17 LAB — CBC
HCT: 25.6 % — ABNORMAL LOW (ref 39.0–52.0)
Hemoglobin: 8.3 g/dL — ABNORMAL LOW (ref 13.0–17.0)
MCH: 28.9 pg (ref 26.0–34.0)
MCHC: 32.4 g/dL (ref 30.0–36.0)
MCV: 89.2 fL (ref 80.0–100.0)
Platelets: 268 10*3/uL (ref 150–400)
RBC: 2.87 MIL/uL — ABNORMAL LOW (ref 4.22–5.81)
RDW: 17.7 % — ABNORMAL HIGH (ref 11.5–15.5)
WBC: 19.8 10*3/uL — ABNORMAL HIGH (ref 4.0–10.5)
nRBC: 0.2 % (ref 0.0–0.2)

## 2021-07-17 LAB — PHOSPHORUS: Phosphorus: 3.2 mg/dL (ref 2.5–4.6)

## 2021-07-17 LAB — MAGNESIUM: Magnesium: 2.2 mg/dL (ref 1.7–2.4)

## 2021-07-17 IMAGING — DX DG ABD PORTABLE 1V
1 series · 1 of 1 positions shown · non-contrast
Comparison: Radiographs [DATE] and [DATE].

CLINICAL DATA: Feeding tube placement.

EXAM:
PORTABLE ABDOMEN - 1 VIEW

[abdomen kub]
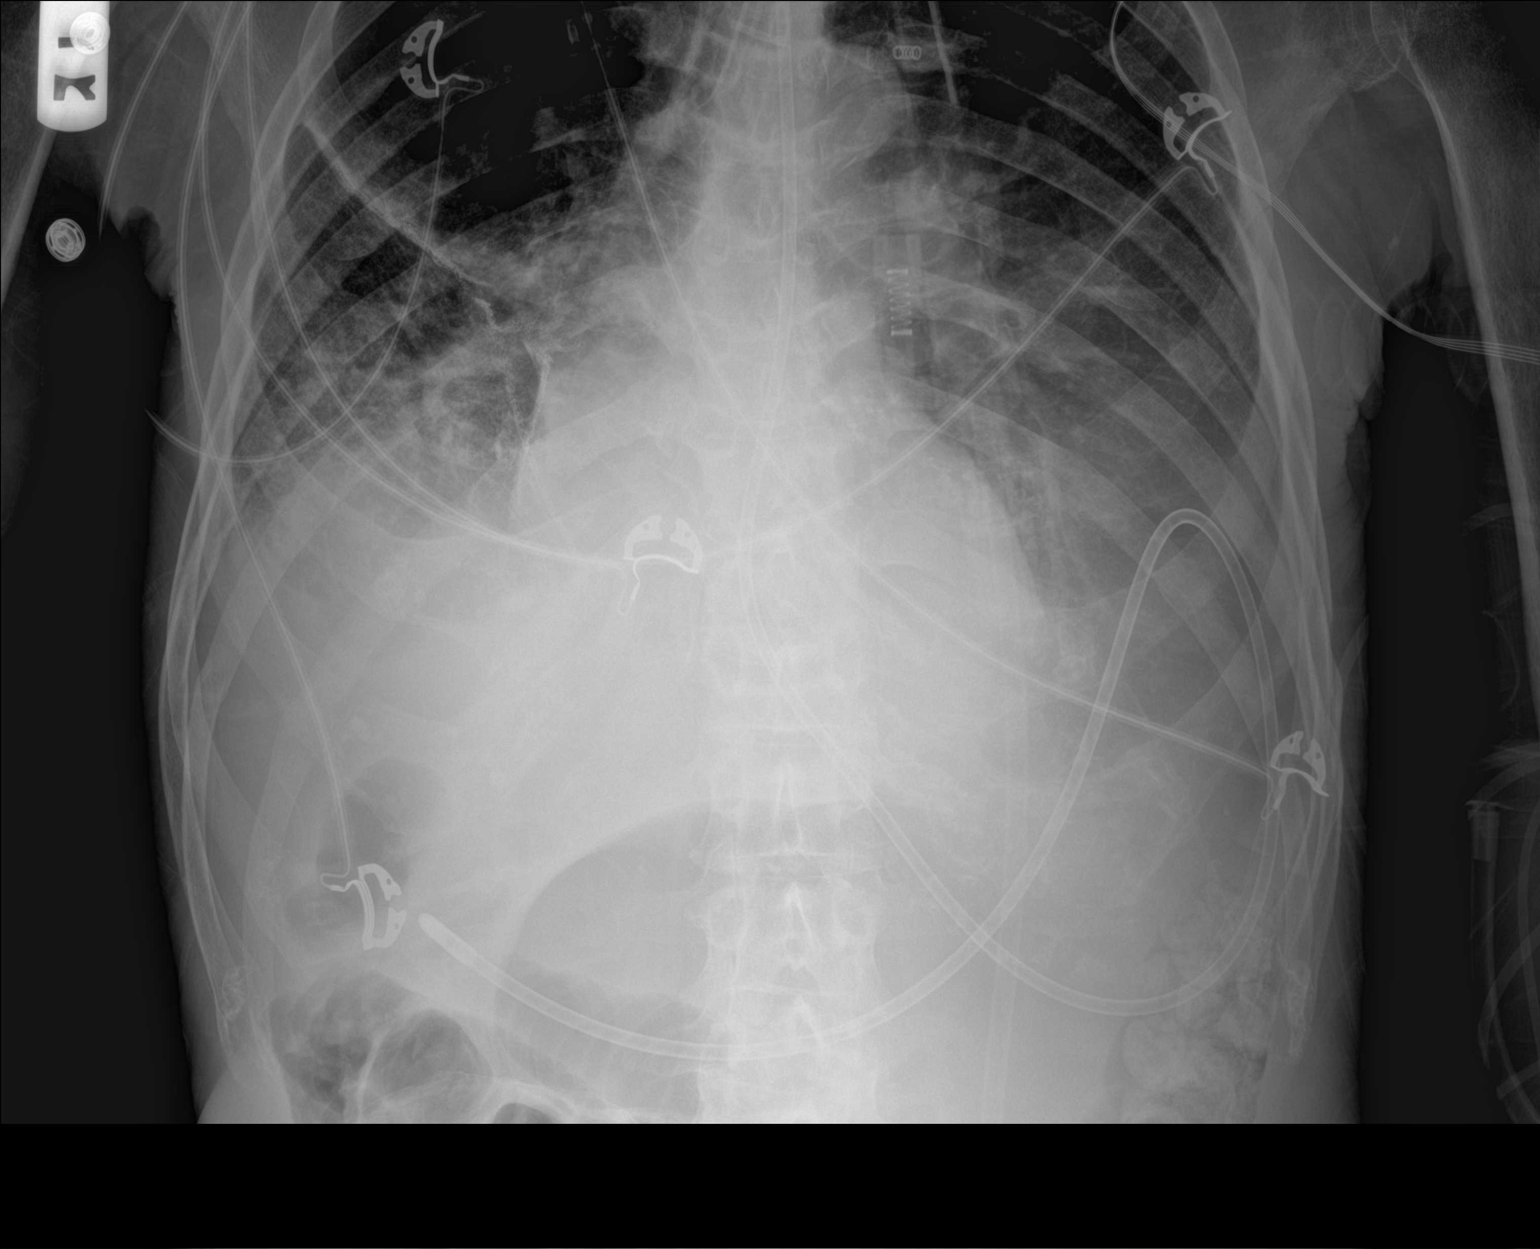

[1 of 1 positions shown; findings below may reference images not displayed]

FINDINGS: [D6] hours. The feeding tube has been advanced with the tip now
overlying the right upper quadrant, likely in the distal stomach or
proximal duodenum. Persistent mild gastric and small bowel
distension. Persistent bilateral pleural effusions and bibasilar
pulmonary opacities.
IMPRESSION: Interval advancement of the feeding tube to the level of the distal
stomach or proximal duodenum.

## 2021-07-17 IMAGING — DX DG CHEST 1V PORT
1 series · 1 of 1 positions shown · non-contrast
Comparison: [DATE]

CLINICAL DATA: Acute respiratory failure.

EXAM:
PORTABLE CHEST 1 VIEW

[chest ap]
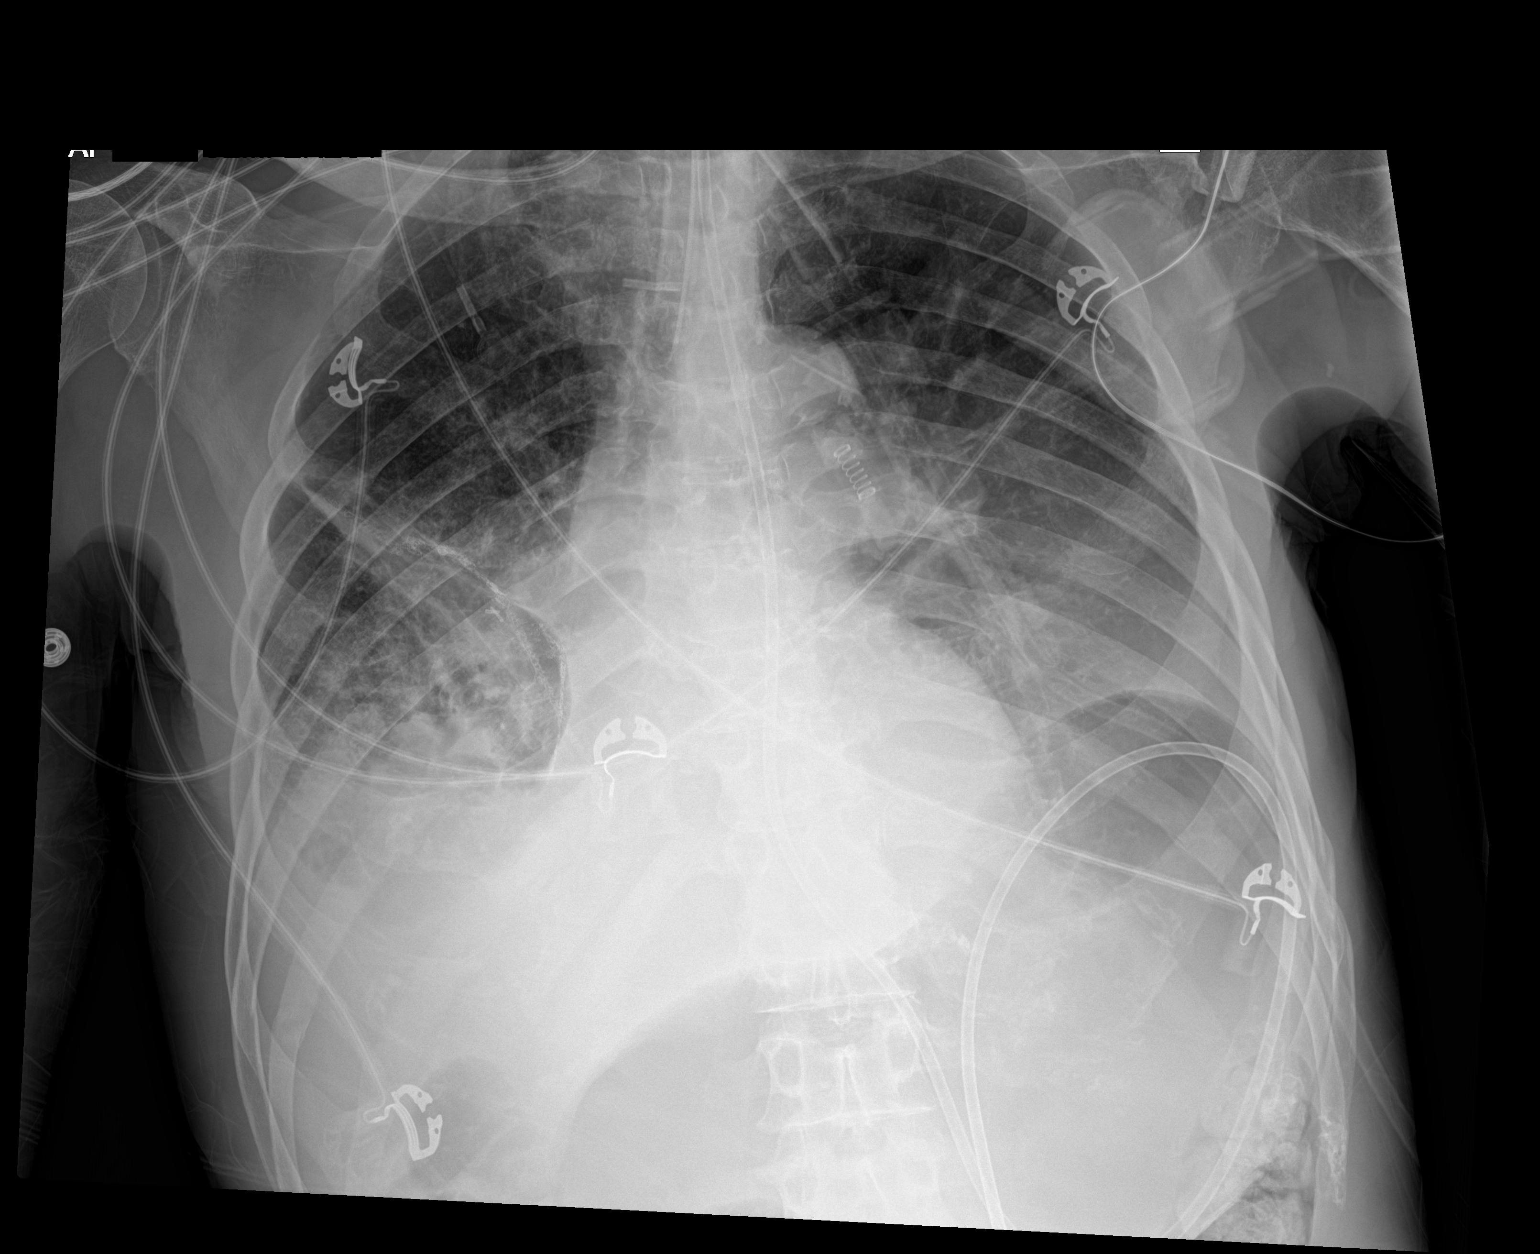

[1 of 1 positions shown; findings below may reference images not displayed]

FINDINGS: There is stable endotracheal tube and enteric tube positioning. Low
lung volumes are seen with stable moderate severity atelectasis,
scarring and/or infiltrate seen within the right lung base. Right
perihilar and right infrahilar surgical sutures are noted. A small
right pleural effusion is suspected. No pneumothorax is identified.
The heart size and mediastinal contours are within normal limits.
The visualized skeletal structures are unremarkable.
IMPRESSION: 1. Stable moderate severity right basilar atelectasis, scarring
and/or multifocal infiltrate.
2. Small right pleural effusion.

## 2021-07-17 IMAGING — DX DG ABD PORTABLE 1V
1 series · 1 of 1 positions shown · non-contrast
Comparison: [DATE]

CLINICAL DATA: Orogastric tube placement

EXAM:
PORTABLE ABDOMEN - 1 VIEW

[abdomen kub]
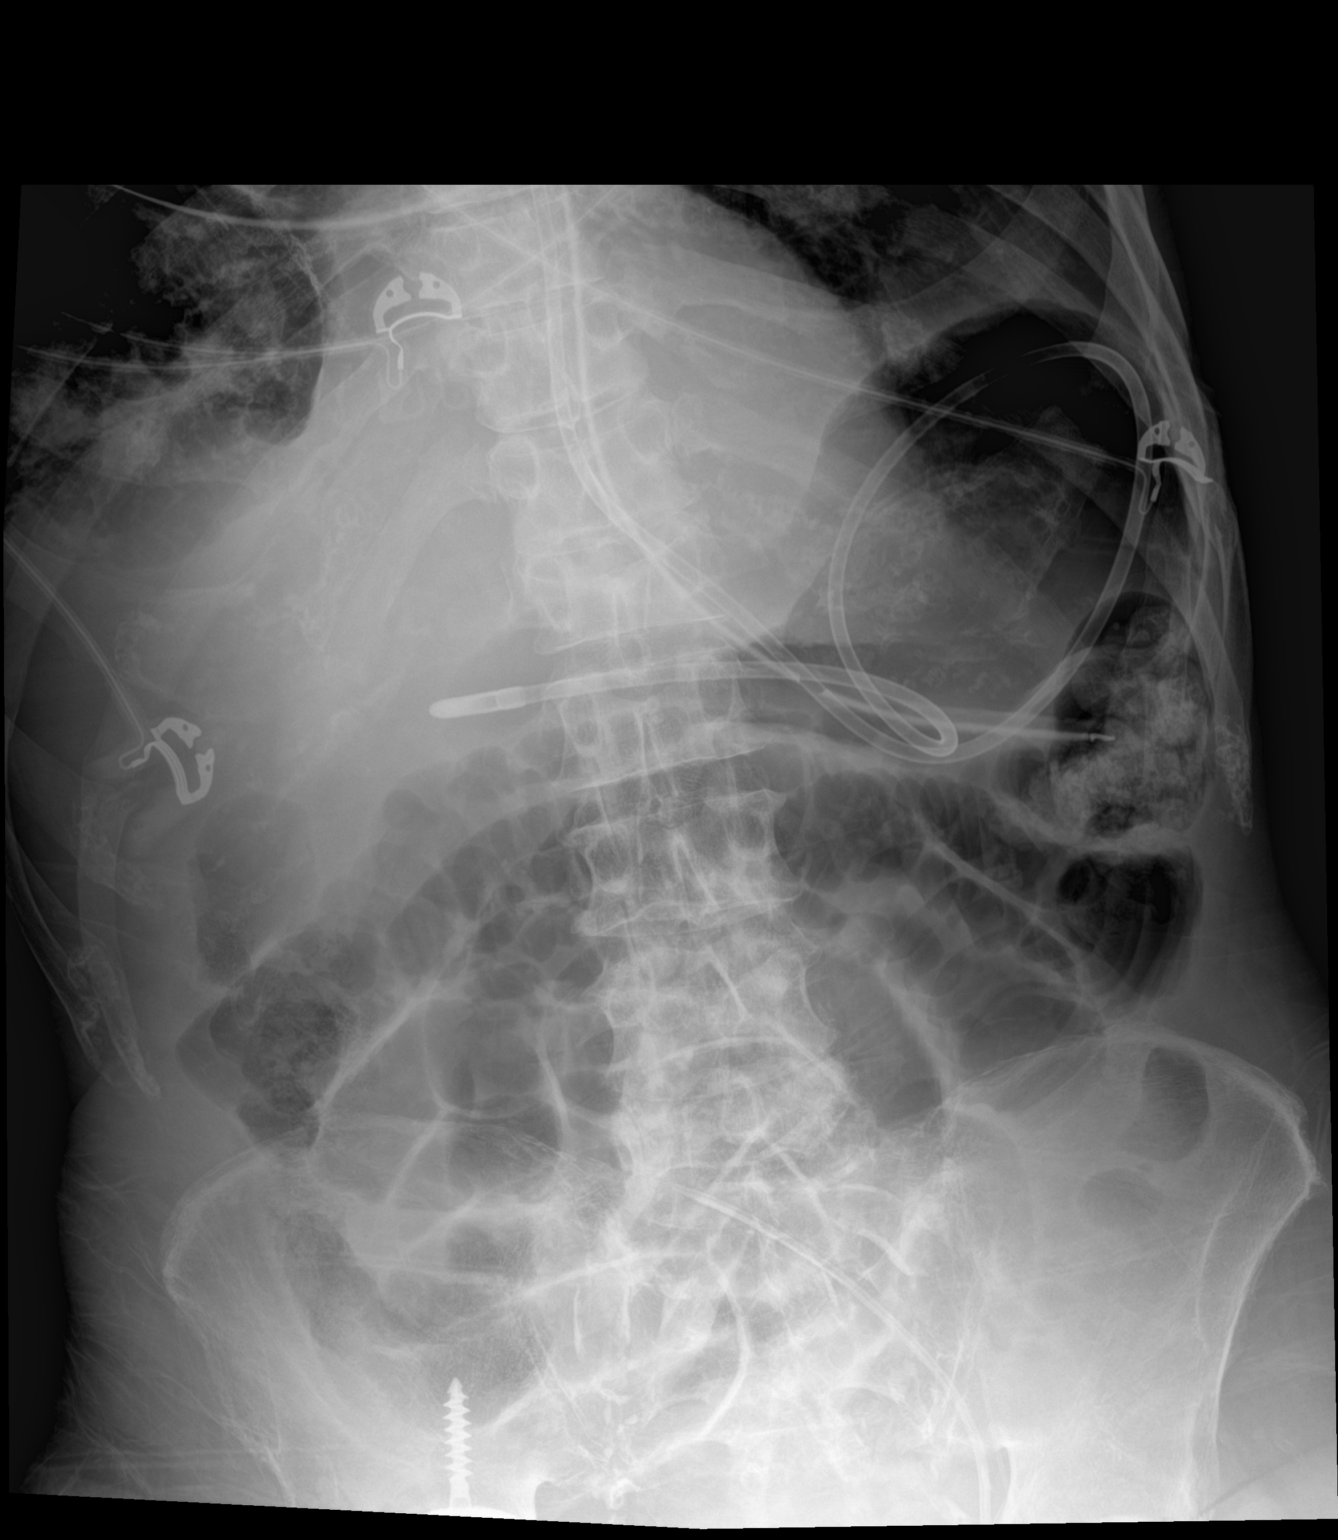

[1 of 1 positions shown; findings below may reference images not displayed]

FINDINGS: Nasogastric tube tip is seen within the a proximal to mid body of
the stomach. Nasoenteric feeding tube appears looped within the
gastric fundus with its tip in the expected gastric antrum. Multiple
gas-filled loops of small bowel are seen throughout the abdomen with
a relative paucity of gas within the large bowel suggesting a distal
partial or developing complete small bowel obstruction. The pelvis
excluded from view. Left femoral central venous catheter tip noted
in the expected caval confluence.
IMPRESSION: Nasogastric tube tip within the mid body of the stomach.

Nasoenteric feeding tube tip within the gastric antrum.

Suspected distal partial or developing complete small bowel
obstruction.

## 2021-07-17 IMAGING — DX DG ABD PORTABLE 1V
1 series · 1 of 1 positions shown · non-contrast
Comparison: [DATE]

CLINICAL DATA: Vomiting, abdominal distention

EXAM:
PORTABLE ABDOMEN - 1 VIEW

[abdomen kub]
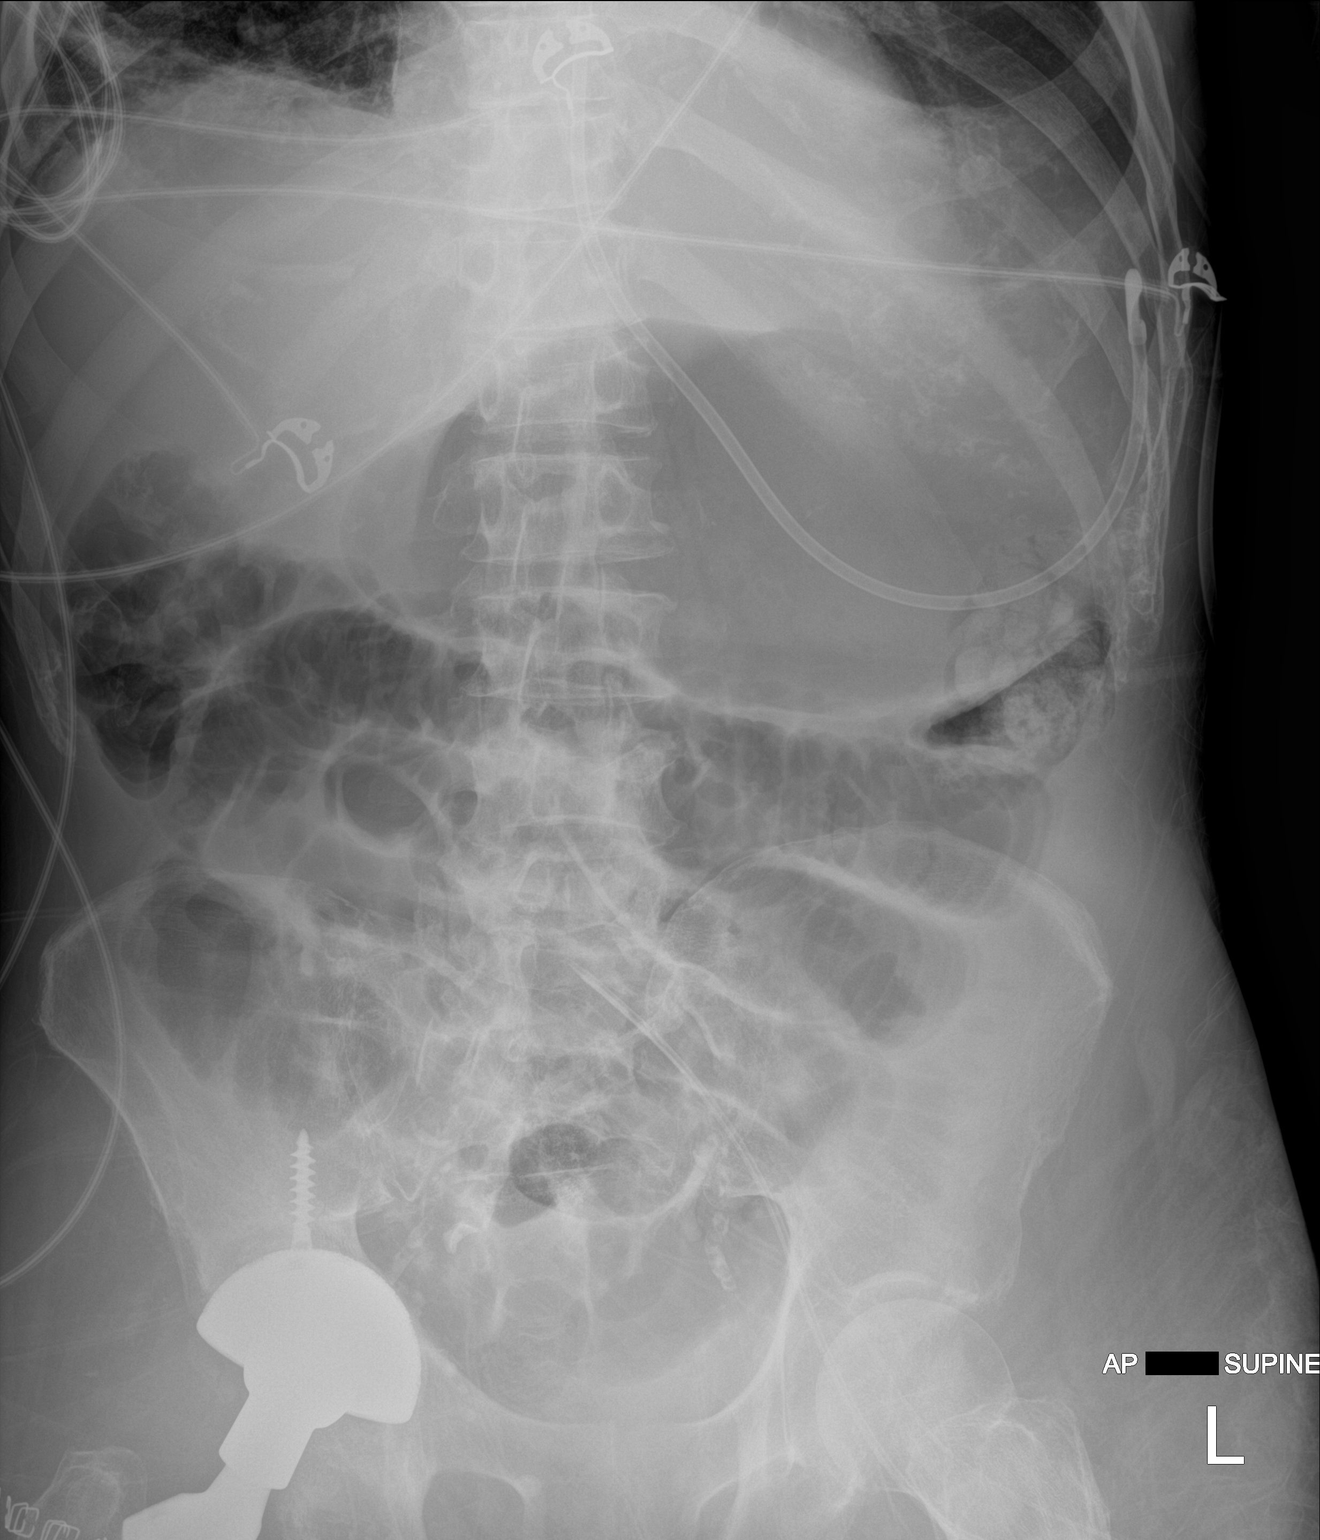

[1 of 1 positions shown; findings below may reference images not displayed]

FINDINGS: There is interval removal of 1 of 2 enteric tubes. Tip of remaining
enteric tube is seen in the area of lateral aspect of fundus of the
stomach. There is moderate gaseous distention of stomach which was
not evident in the previous study. There is interval worsening of
small bowel dilation measuring up to 3.9 cm. Gas and stool are noted
in the area of splenic flexure. No abnormal masses are seen.
Vascular calcifications are seen. There is right hip arthroplasty.
Skin staples are seen in the right hip. There is a vascular catheter
in the course of left iliac vessels with no significant interval
change.
IMPRESSION: Tip of enteric tube is seen in the fundus of the stomach. There is
interval moderate gaseous distention of stomach. There is interval
worsening of small bowel dilation. Findings suggest ileus or partial
distal small bowel obstruction.

## 2021-07-17 IMAGING — US US RENAL
1 series · 14 of 25 positions shown · non-contrast
Comparison: None.

CLINICAL DATA: Acute renal insufficiency

EXAM:
RENAL / URINARY TRACT ULTRASOUND COMPLETE

[Series 1: us renal · 14 of 43 slices shown]
[im 1/43]
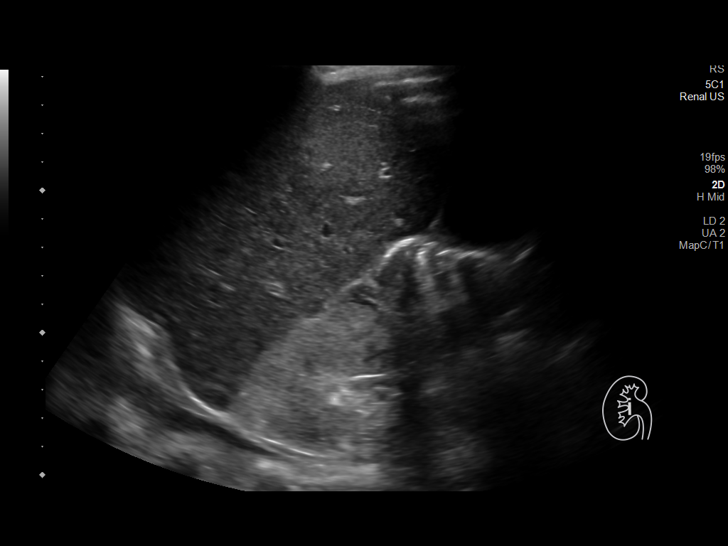
[im 4/43]
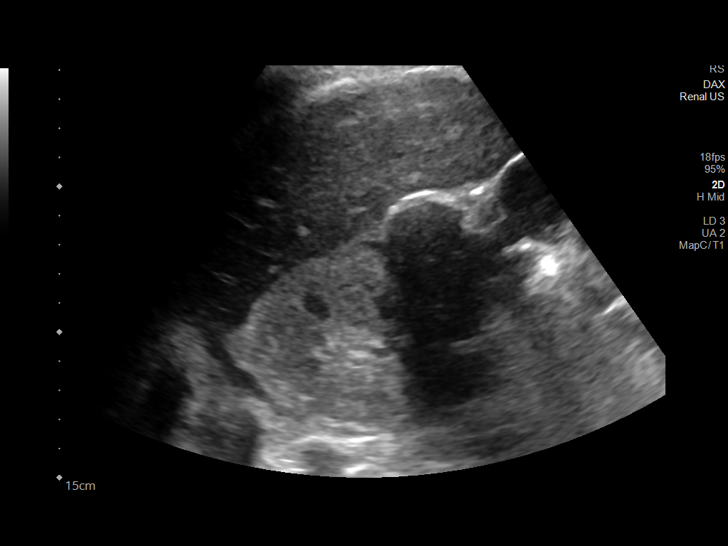
[im 8/43]
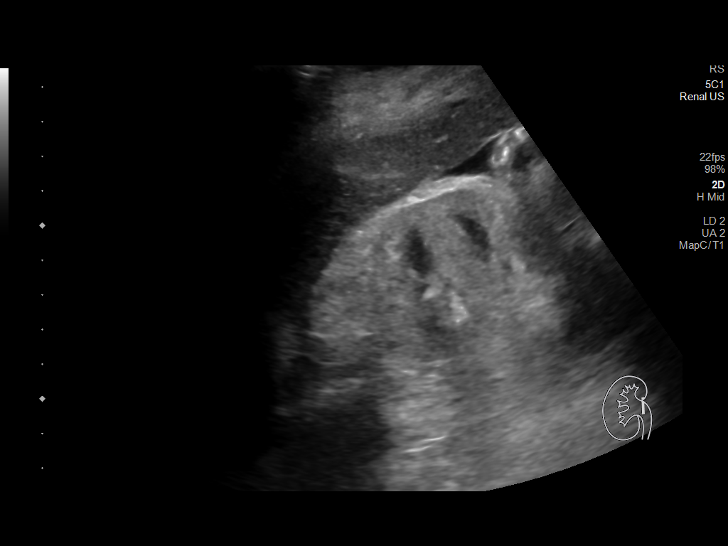
[im 11/43]
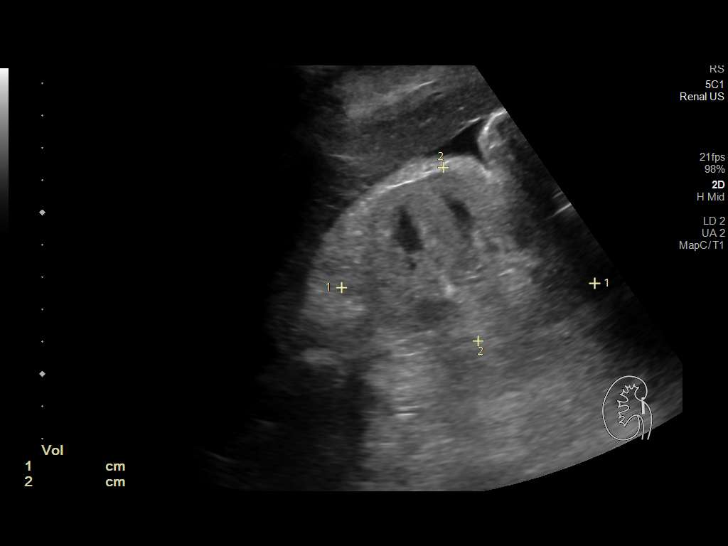
[im 15/43]
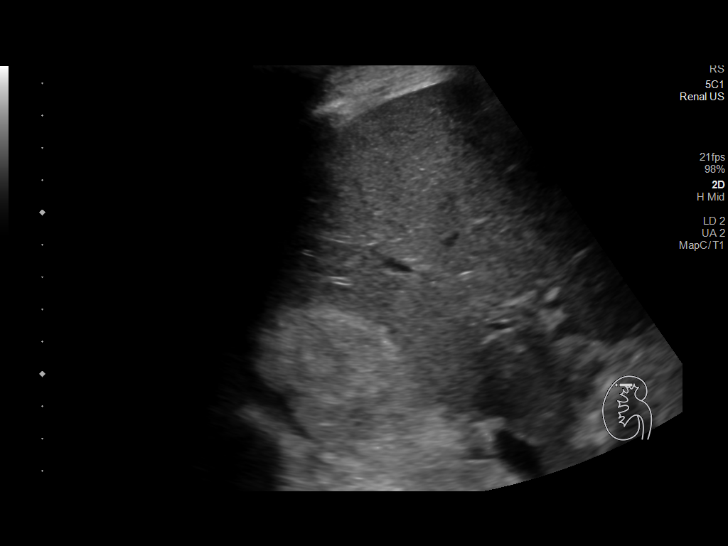
[im 16/43]
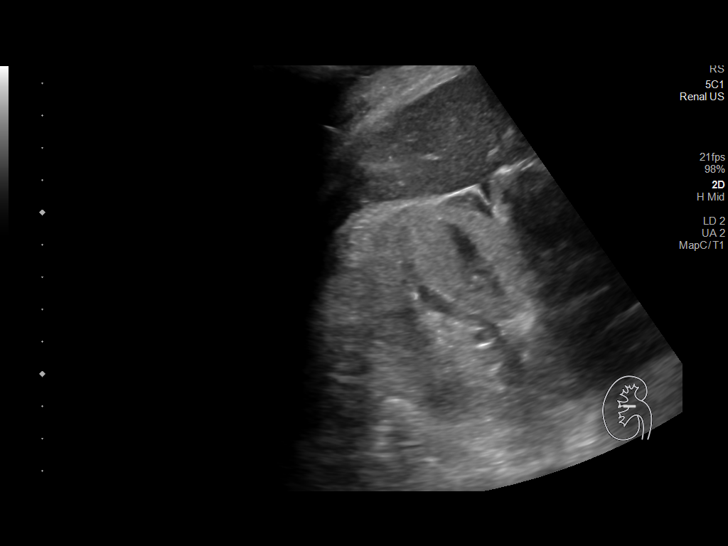
[im 20/43]
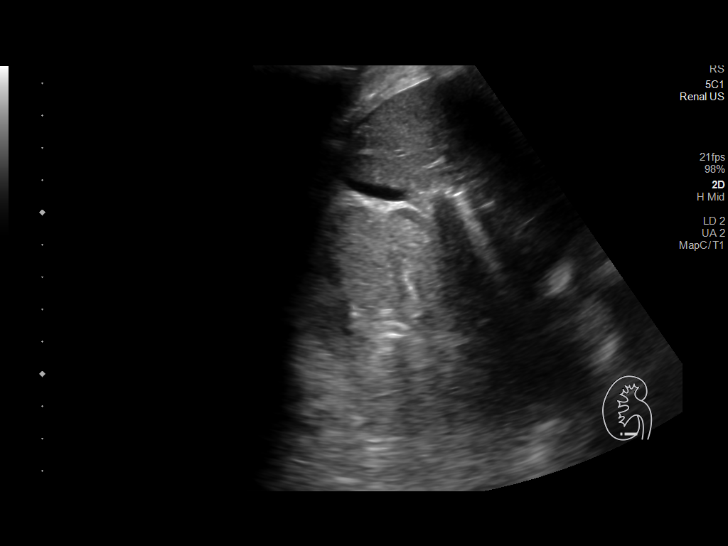
[im 23/43]
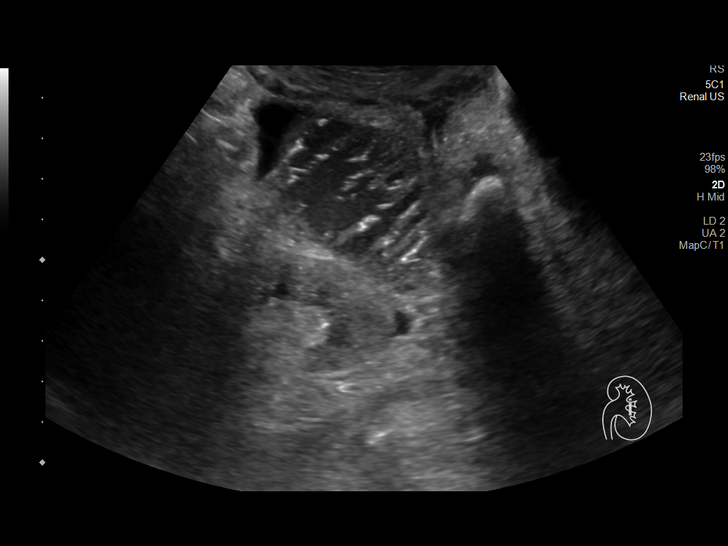
[im 27/43]
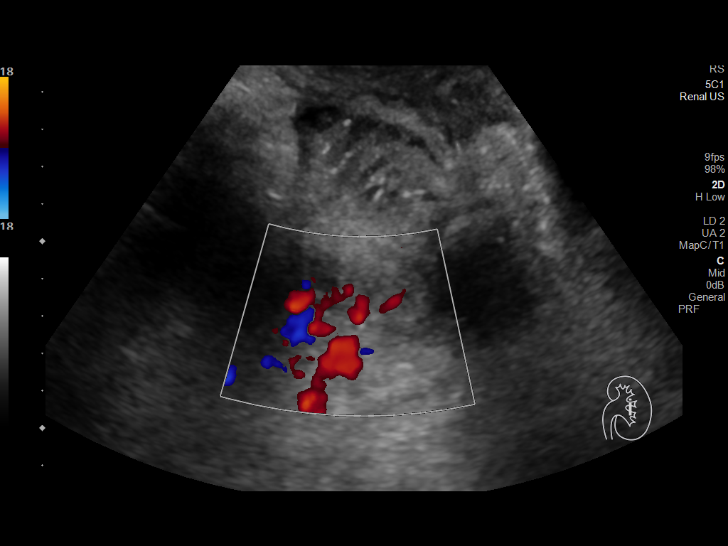
[im 29/43]
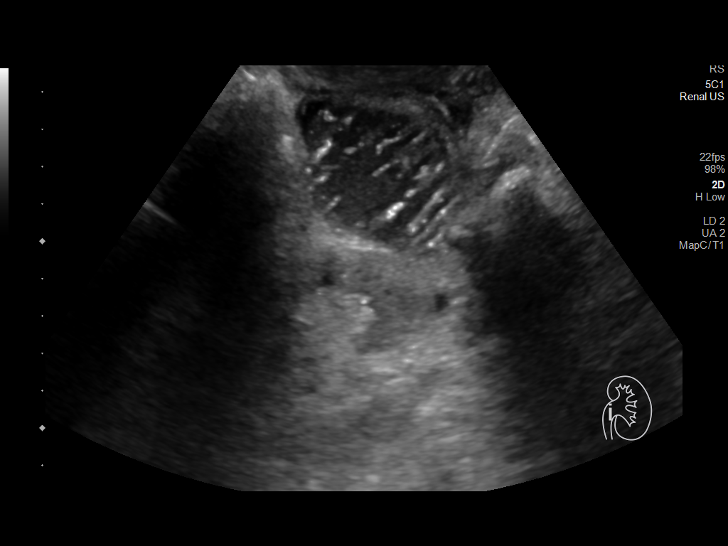
[im 32/43]
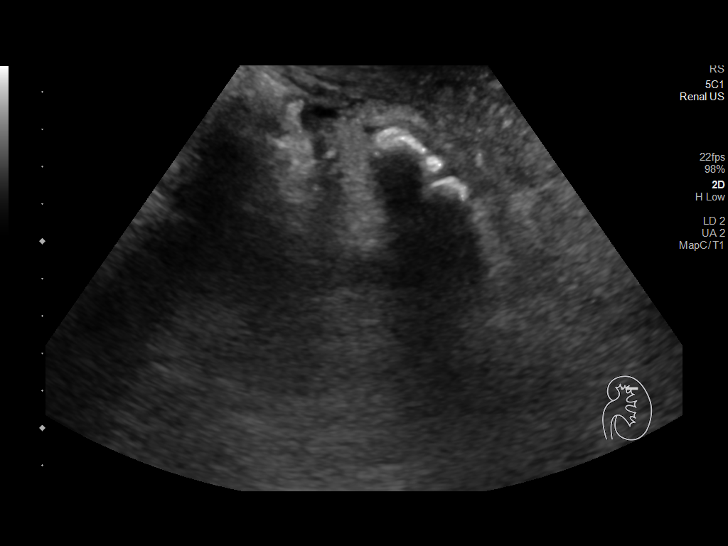
[im 36/43]
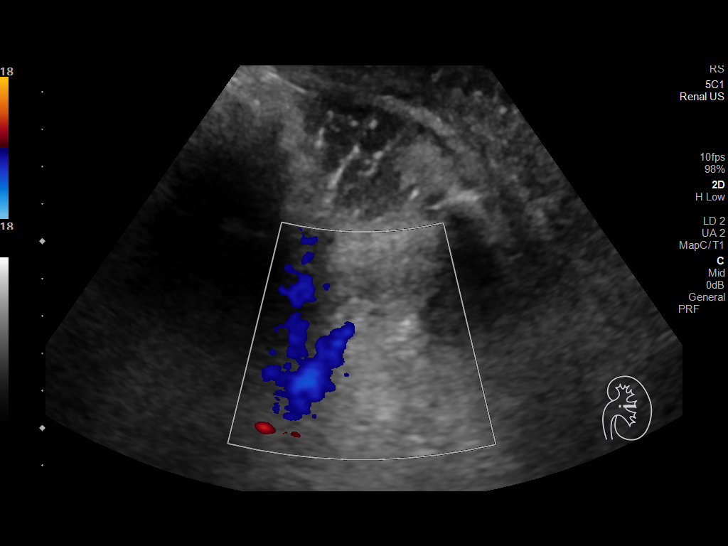
[im 39/43]
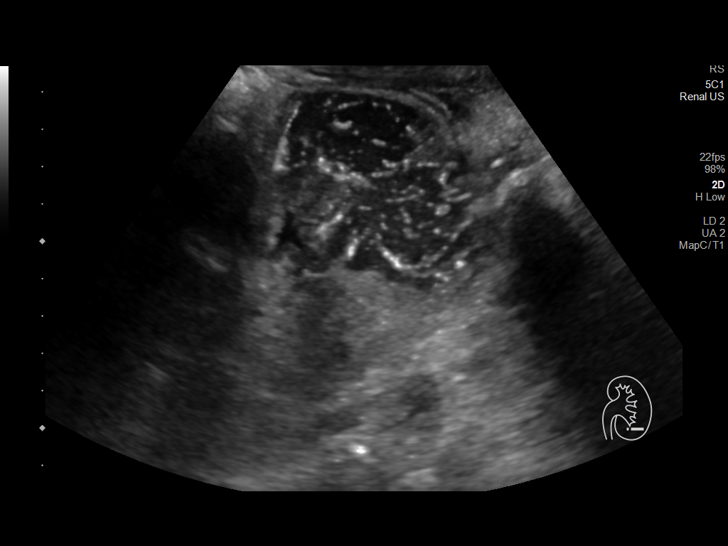
[im 43/43]
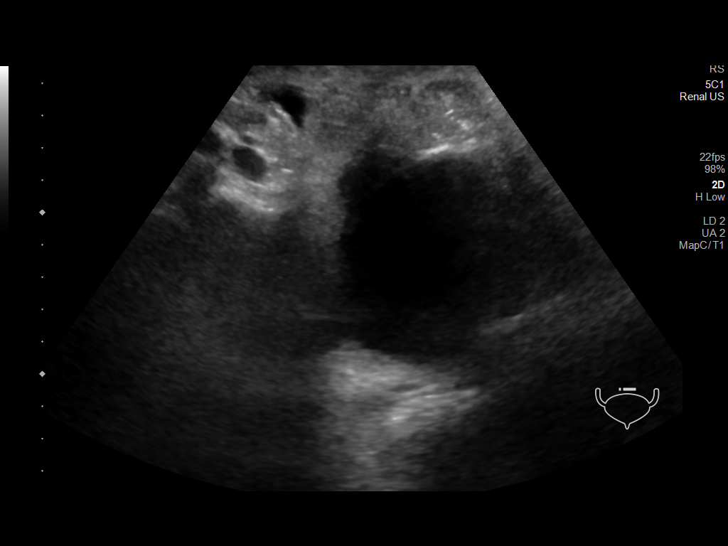

[14 of 25 positions shown; findings below may reference images not displayed]

FINDINGS: Right Kidney:

Renal measurements: 7.8 x 5.5 x 6.6 cm = volume: 148 mL. Increased
renal echogenicity. No hydronephrosis.

Left Kidney:

Renal measurements: 5.5 x 4.1 x 4.1 cm = volume: 47.1 mL. Increased
renal echogenicity. Suspect renal atrophy, although the left kidney
is poorly visualized secondary to adjacent bowel gas.

Bladder:

Appears normal for degree of bladder distention.

Other:

Trace perinephric fluid and small volume abdominal ascites.
Incidental note is made of a right-sided pleural effusion.
IMPRESSION: Increased renal echogenicity, suggesting medical renal disease.

Suboptimal evaluation of the left kidney.  Favor left renal atrophy.

Right pleural effusion with small volume ascites, suggesting fluid
overload.

## 2021-07-17 MED ORDER — LEVETIRACETAM IN NACL 500 MG/100ML IV SOLN
500.0000 mg | Freq: Two times a day (BID) | INTRAVENOUS | Status: DC
  Administered 2021-07-17 – 2021-07-27 (×21): 500 mg via INTRAVENOUS
  Filled 2021-07-17 (×21): qty 100

## 2021-07-17 MED ORDER — NOREPINEPHRINE 16 MG/250ML-% IV SOLN
0.0000 ug/min | INTRAVENOUS | Status: DC
  Administered 2021-07-18: 16 ug/min via INTRAVENOUS
  Administered 2021-07-19: 12 ug/min via INTRAVENOUS
  Filled 2021-07-17 (×3): qty 250

## 2021-07-17 MED ORDER — VASOPRESSIN 20 UNITS/100 ML INFUSION FOR SHOCK
0.0000 [IU]/min | INTRAVENOUS | Status: DC
  Administered 2021-07-17 – 2021-07-18 (×2): 0.03 [IU]/min via INTRAVENOUS
  Filled 2021-07-17 (×3): qty 100

## 2021-07-17 MED ORDER — SODIUM CHLORIDE 0.9 % IV SOLN
12.5000 mg | Freq: Four times a day (QID) | INTRAVENOUS | Status: DC | PRN
  Filled 2021-07-17: qty 0.5

## 2021-07-17 MED ORDER — PANTOPRAZOLE 2 MG/ML SUSPENSION
40.0000 mg | Freq: Every day | ORAL | Status: DC
  Administered 2021-07-17 – 2021-07-27 (×11): 40 mg
  Filled 2021-07-17 (×12): qty 20

## 2021-07-17 MED ORDER — SODIUM BICARBONATE 650 MG PO TABS
650.0000 mg | ORAL_TABLET | Freq: Four times a day (QID) | ORAL | Status: DC
  Administered 2021-07-17 – 2021-07-18 (×8): 650 mg
  Filled 2021-07-17 (×8): qty 1

## 2021-07-17 MED ORDER — MIDODRINE HCL 5 MG PO TABS
5.0000 mg | ORAL_TABLET | Freq: Three times a day (TID) | ORAL | Status: DC
  Administered 2021-07-17 – 2021-07-19 (×7): 5 mg
  Filled 2021-07-17 (×8): qty 1

## 2021-07-17 MED ORDER — LEVETIRACETAM IN NACL 1000 MG/100ML IV SOLN
1000.0000 mg | Freq: Once | INTRAVENOUS | Status: AC
  Administered 2021-07-17: 1000 mg via INTRAVENOUS
  Filled 2021-07-17: qty 100

## 2021-07-17 MED ORDER — ONDANSETRON HCL 4 MG/2ML IJ SOLN
4.0000 mg | Freq: Four times a day (QID) | INTRAMUSCULAR | Status: DC | PRN
  Administered 2021-07-17: 4 mg via INTRAVENOUS
  Filled 2021-07-17: qty 2

## 2021-07-17 MED ORDER — SODIUM BICARBONATE 8.4 % IV SOLN
50.0000 meq | Freq: Once | INTRAVENOUS | Status: AC
  Administered 2021-07-17: 50 meq via INTRAVENOUS
  Filled 2021-07-17: qty 50

## 2021-07-17 NOTE — Progress Notes (Addendum)
eLink Physician-Brief Progress Note Patient Name: Gary Stevenson DOB: Feb 27, 1941 MRN: [id number]   Date of Service  07/17/2021  HPI/Events of Note  Patient has gone from requiring 40 % FiO2 to 100 % over the course of the evening and is now also on a PEEP of 8 with sats of 89 - 90 %, per bedside RN lungs are clear but diminished, breath sounds  are symmetrical, Pressor requirements have also increased from 6 to 16 mcg.  eICU Interventions  Stat portable CXR, ABG, lactic acid and BNP ordered. One amp of Sodium bicarbonate ordered iv push for metabolic acidosis.        [name] 07/17/2021, 10:11 PM

## 2021-07-17 NOTE — Progress Notes (Signed)
Rn called and informed RT of low sat and o2 titration up to 100%. At 2035, I attempted to wean fio2 to 60%. Pt unable to maintain sat. Placed pt back up to 100%. Increased peep to 8. Pt sats currently 88-90. Rn attempted to get in touch with doctor. Waiting for a return call.

## 2021-07-17 NOTE — Progress Notes (Signed)
Inpatient Rehab Admissions Coordinator:   Pt. Remians on the ventilator, not appropriate for CIR at this time. I will sign off, but MD may place consult if Pt. Appears to be an appropriate candidate once pt. Is off the vent.   Clemens Catholic, [place], [place] Admissions Coordinator  [phone] ([place]) [phone] (office)

## 2021-07-17 NOTE — Progress Notes (Signed)
LTM maintained at bedside. Sedation stopped this am per doctors. No skin breakdown noted. Impedance = good.

## 2021-07-17 NOTE — Progress Notes (Signed)
OT Cancellation Note  Patient Details Name: Gary Stevenson MRN: [id number] DOB: 07-Sep-1940   Cancelled Treatment:    Reason Eval/Treat Not Completed: Patient not medically ready RASS -5, overall unresponsive. Will hold therapy attempts and follow-up at a later date.  Layla Maw 07/17/2021, 12:31 PM

## 2021-07-17 NOTE — Procedures (Signed)
EEG Procedure CPT/Type of Study: 81275; 24hr EEG with video Referring Provider: Elgergawy Primary Neurological Diagnosis: seizure, status epilepticus, SDH  History: This is a 81 yr old patient, undergoing an EEG to evaluate for SDH, seizures. Clinical State: disoriented  Technical Description:  The EEG was performed using standard setting per the guidelines of American Clinical Neurophysiology Society (ACNS).  A minimum of 21 electrodes were placed on scalp according to the International 10-20 or/and 10-10 Systems. Supplemental electrodes were placed as needed. Single EKG electrode was also used to detect cardiac arrhythmia. Patient's behavior was continuously recorded on video simultaneously with EEG. A minimum of 16 channels were used for data display. Each epoch of study was reviewed manually daily and as needed using standard referential and bipolar montages. Computerized quantitative EEG analysis (such as compressed spectral array analysis, trending, automated spike & seizure detection) were used as indicated.   Day 1: from 1134 07/16/21 to 0730 07/17/21  EEG Description: Overall Amplitude:Normal Predominant Frequency: The background activity showed theta, with about 4-6 Hz, that was frequent. Superimposed Frequencies: occasional delta and some beta activity bilaterally The background was symmetric  Background Abnormalities: focal slowing; right central delta-theta focal slowing Rhythmic or periodic pattern: Bilateral independent periodic discharges; spiky 1-2hz  BIPDs with some subtle ictal evolution during the initial portion of the record, improved Epileptiform activity: Yes; spiky morphology to BIPDs, improved overnight Electrographic seizures: Yes; subtle subclinical ictal evolution to left and right central BIPDs during the initial portion of the recording with buildup of rhythmic 10hz  fast activity. These were resolved in the afternoon and evening.  Events: no   Breach rhythm:  no  Reactivity: Present  Stimulation procedures:  Hyperventilation: not done Photic stimulation: not done  Sleep Background: Stage II  EKG:no significant arrhythmia  Impression: This was an abnormal continuous video EEG due to BIPDs with some brief electrographic seizures during the initial portion of the recording, indicative of a multifocal epileptogenic disturbance. Seizures were improved in the afternoon and evening.

## 2021-07-17 NOTE — Progress Notes (Signed)
eLink Physician-Brief Progress Note Patient Name: Gary Stevenson DOB: 07/20/1940 MRN: [id number]   Date of Service  07/17/2021  HPI/Events of Note  Patient vomited despite PRN iv Zofran, last QTC 0.469. CXR does not show pneumothorax or pulmonary edema, raising the possibility of a PE, creatinine is 1.93.  eICU Interventions  Phenergan 12.5 mg iv Q 6 hours PRN nausea / vomiting not relieved by Zofran ordered. Given recent surgery and bilateral subdural hematomas I feel he cannot be empirically anticoagulated, if PE  were documented by a CTA (which would most likely severely damage his kidneys), there is a question of whether  he could be safely anticoagulated, therefore I will order a D-Dimer, perfusion scan and echocardiogram for now and consider CTA chest if his renal function normalizes. Vasopressin gtt has been added to Levophed.        Frederik Pear 07/17/2021, 10:46 PM

## 2021-07-17 NOTE — Progress Notes (Signed)
Neurology Progress Note  Brief HPI per note 07/16/21:  Patient became unresponsive on 07/14/21. He was found to be hypoxic and hypotensive. He was connected to [facility] without noted seizure activity (but patient on sedation). He was continued on Phenobarbital and level was normal. He was continued on Keppra. He was transferred to ICU and intubated.   S: Patient is unable to participate in ROS due to unresponsiveness. In interim since above, his Keppra has been increased to 571m IV q12 hours due to seizure activity on EEG. He remains on Phenobarbital.   O: Current vital signs: BP 123/75    Pulse 83    Temp (!) 97.4 F (36.3 C) (Oral)    Resp (!) 30    Ht '5\' 5"'  (1.651 m)    Wt 53.1 kg    SpO2 99%    BMI 19.48 kg/m  Vital signs in last 24 hours: Temp:  [94.9 F (34.9 C)-102.8 F (39.3 C)] 97.4 F (36.3 C) (01/20 0722) Pulse Rate:  [72-109] 83 (01/20 0600) Resp:  [14-33] 30 (01/20 0600) BP: (90-132)/(45-116) 123/75 (01/20 0600) SpO2:  [96 %-100 %] 99 % (01/20 0600) Arterial Line BP: (94-130)/(47-67) 128/62 (01/20 0600) FiO2 (%):  [30 %-50 %] 30 % (01/20 0354)  GENERAL: Acutely ill appearing male lying unresponsive in ICU bed.  HEENT: Normocephalic and atraumatic. LUNGS: Normal respiratory effort.  CV: RRR on tele.  Ext: warm.  NEURO:  Mental Status: Unresponsive to voice, loud clapping, or tactile/noxious stimuli. No speech. Patient with ETT in place on ventilator. He is off sedation.  He does not follow commands.  His eyes on conjugate. Pupils small but equal round and reactive. Weak corneal reflex. No gag. Weak cough noted. Does grimace to nares stimulation and moves his head to left. No movement of extremities with noxious stimuli. No tremors or myoclonus noted. Gait deferred due to bedrest.   Medications  Current Facility-Administered Medications:    0.9 %  sodium chloride infusion, 250 mL, Intravenous, Continuous, Meier, NHortencia Conradi MD, Last Rate: 10 mL/hr at 07/17/21 0600,  Infusion Verify at 07/17/21 0600   acetaminophen (TYLENOL) tablet 325-650 mg, 325-650 mg, Per Tube, Q6H PRN, URalph Dowdy RPH, 650 mg at 07/16/21 2321   alum & mag hydroxide-simeth (MAALOX/MYLANTA) 200-200-20 MG/5ML suspension 30 mL, 30 mL, Per Tube, Q4H PRN, Ueland, Emma M, RPH   ceFEPIme (MAXIPIME) 2 g in sodium chloride 0.9 % 100 mL IVPB, 2 g, Intravenous, Q24H, [place], PCharisse March MD, Stopped at 07/16/21 1237   chlorhexidine gluconate (MEDLINE KIT) (PERIDEX) 0.12 % solution 15 mL, 15 mL, Mouth Rinse, BID, MMaryjane Hurter MD, 15 mL at 07/16/21 1940   Chlorhexidine Gluconate Cloth 2 % PADS 6 each, 6 each, Topical, Daily, Swinteck, BAaron Edelman MD, 6 each at 07/17/21 0238   dexmedetomidine (PRECEDEX) 400 MCG/100ML (4 mcg/mL) infusion, 0.4-1.2 mcg/kg/hr, Intravenous, Titrated, DSpero Geralds MD, Stopped at 07/17/21 0312   dextrose 5 % solution, , Intravenous, Continuous, DSpero Geralds MD, Last Rate: 75 mL/hr at 07/17/21 0600, Infusion Verify at 07/17/21 0600   docusate (COLACE) 50 MG/5ML liquid 100 mg, 100 mg, Per Tube, BID, MMaryjane Hurter MD, 100 mg at 07/16/21 2140   feeding supplement (VITAL AF 1.2 CAL) liquid 1,000 mL, 1,000 mL, Per Tube, Continuous, DSpero Geralds MD, Last Rate: 60 mL/hr at 07/16/21 1000, Rate Change at 07/16/21 1000   fentaNYL (SUBLIMAZE) injection 50-200 mcg, 50-200 mcg, Intravenous, Q30 min PRN, MMaryjane Hurter MD, 25 mcg at 07/15/21 [id number]  folic acid injection 1 mg, 1 mg, Intravenous, Daily, Elgergawy, Silver Huguenin, MD, 1 mg at 07/16/21 1050   levETIRAcetam (KEPPRA) IVPB 1000 mg/100 mL premix, 1,000 mg, Intravenous, Once, Donnetta Simpers, MD, Last Rate: 400 mL/hr at 07/17/21 0744, 1,000 mg at 07/17/21 0744   levETIRAcetam (KEPPRA) IVPB 500 mg/100 mL premix, 500 mg, Intravenous, Q12H, Donnetta Simpers, MD   lip balm (CARMEX) ointment, , Topical, PRN, Elgergawy, Silver Huguenin, MD, 75 application at 35/70/17 1038   MEDLINE mouth rinse, 15 mL, Mouth Rinse, 10 times  per day, Maryjane Hurter, MD, 15 mL at 07/17/21 0551   menthol-cetylpyridinium (CEPACOL) lozenge 3 mg, 1 lozenge, Oral, PRN **OR** phenol (CHLORASEPTIC) mouth spray 1 spray, 1 spray, Mouth/Throat, PRN, Swinteck, Aaron Edelman, MD   midodrine (PROAMATINE) tablet 5 mg, 5 mg, Per Tube, TID WC, Spero Geralds, MD   multivitamin with minerals tablet 1 tablet, 1 tablet, Per Tube, Daily, Ralph Dowdy, RPH, 1 tablet at 07/16/21 1038   norepinephrine (LEVOPHED) 16 mg in 29m premix infusion, 0-40 mcg/min, Intravenous, Titrated, DSpero Geralds MD, Last Rate: 4.69 mL/hr at 07/17/21 0600, 5 mcg/min at 07/17/21 0600   pantoprazole (PROTONIX) injection 40 mg, 40 mg, Intravenous, Q12H, Elgergawy, DSilver Huguenin MD, 40 mg at 07/16/21 2141   PHENobarbital (LUMINAL) tablet 64.8 mg, 64.8 mg, Per Tube, BID, Elgergawy, DSilver Huguenin MD, 64.8 mg at 07/16/21 1936   polyethylene glycol (MIRALAX / GLYCOLAX) packet 17 g, 17 g, Per Tube, Daily, MMaryjane Hurter MD, 17 g at 07/16/21 1037   polyethylene glycol (MIRALAX / GLYCOLAX) packet 17 g, 17 g, Per Tube, Daily PRN, Ueland, Emma M, RPH   senna (SENOKOT) tablet 8.6 mg, 1 tablet, Per Tube, BID, Ueland, Emma M, RPH, 8.6 mg at 07/16/21 2141   sodium bicarbonate tablet 650 mg, 650 mg, Per Tube, BID, DSpero Geralds MD, 650 mg at 07/16/21 2146   sodium chloride flush (NS) 0.9 % injection 10-40 mL, 10-40 mL, Intracatheter, Q12H, MMaryjane Hurter MD, 10 mL at 07/16/21 2149   sodium chloride flush (NS) 0.9 % injection 10-40 mL, 10-40 mL, Intracatheter, PRN, MMaryjane Hurter MD   thiamine tablet 100 mg, 100 mg, Per Tube, Daily **OR** thiamine (B-1) injection 100 mg, 100 mg, Intravenous, Daily, MMaryjane Hurter MD, 100 mg at 07/16/21 1043  Imaging MD has reviewed images in epic and the results pertinent to this consultation are:  MRI Brain 07/15/21.  Stable bilateral frontal subacute subdural hematoma. No midline shift. No new intracranial abnormality. Extensive chronic white  matter disease and parenchymal volume loss, unchanged.  cEEG 07/17/21 read This was an abnormal continuous video EEG due to BIPDs with some brief electrographic seizures during the initial portion of the recording, indicative of a multifocal epileptogenic disturbance. Seizures were improved in the afternoon and evening.   Assessment: 81yo male who came in for right hip surgery on 07/10/21 due to a fall. He was noted to have a seizure post op with risk factors of SDH, brain atrophy, prior evacuation of SDH as well as multiple recent falls. He was loaded with Keppra and started on maintenance therapy. cEEG showed non convulsive status epilepticus arising from right hemisphere likely secondary to SDH. He was then loaded with Phenobarbital after which his EEG improved. He was continued on Keppra 2556mIV q12hrs. His neuro exam continued to improve on 07/12/21. However, he had red MEWS and subsequently was transferred to ICU and intubated. No significant change in exam after stopping sedation.  cEEG showed seizures yesterday but improved overnight. Keppra maintenance was increased to 542m IV q12.   Impression: -seizures likely from SDH, bifrontal.  -Suspicion for hypoxic/anoxic brain injury due to hypoperfusion event with low BP and hypoxia/hypercapnia.   Recommendations/Plan:  -Keppra increased to 5081mIV q12 hours.  -We will continue cEEG overnight due to BIPDs and multifocal epileptogenic disturbance. - Has to be seizure free before discontinuing EEG.  -seizure precautions.   Pt seen by KaClance BollMSN, APN-BC/Nurse Practitioner/Neuro and later by MD. Note and plan to be edited as needed by MD.  Pager: [phone] ADDENDUM Performed a face to face diagnostic evaluation.   I have reviewed the contents of history and physical exam as documented by PA/ARNP/Resident and agree with above documentation.  I have discussed and formulated the above plan as documented. Edits to  the note have been made as needed.  Impression/Key exam findings/Plan: Off sedation with patchy brainstem reflexes. Pupils equal round and sluggish reaction to light, weak corneals and cough. No gag. Grimaces to nares stimulation. No response to noxious stimuli in any of the extremities. Had seizures yesterday afternoon but improved overnight. Keppra increased to 50063mID. Will continue cEEG overnight and has to be seizure free for over 24 hours before taking off cEEG.  This patient is critically ill and at significant risk of neurological worsening, death and care requires constant monitoring of vital signs, hemodynamics,respiratory and cardiac monitoring, neurological assessment, discussion with family, other specialists and medical decision making of high complexity. I spent 35 minutes of neurocritical care time  in the care of  this patient. This was time spent independent of any time provided by nurse practitioner or PA.  [facility]net[facility] Pager Number [phone]  7:35 PM   SalDonnetta SimpersD [facility] [phone] 7pm to 7am, please call on call as listed on AMION.

## 2021-07-17 NOTE — Progress Notes (Signed)
NAME:  Gary Stevenson, MRN:  [id number], DOB:  [birth year], LOS: 9 ADMISSION DATE:  07/20/2021, CONSULTATION DATE: 07/14/2021 REFERRING MD:  Marlyce Huge, CHIEF COMPLAINT:  AMS, Respiratory failure   History of Present Illness:  Gary Stevenson is a 81 y.o. M with PMH significant for right frontal subdural hematomas status post surgical evacuation in 2012, lung cancer status post lobectomy, alcohol abuse, CKD who presented to the ED 1/12 after a fall and was found to have R acetabular fx with interval displacement and underwent R total hip arthroplasty on 07/19/2021.  He was later found with decreased responsiveness and seizure activity that lasted for around 1 minute.  He was seen by Neurology and started on Phenobarbital and Keppra with LTM EEG.  Neurology felt seizure was most likely secondary to subdural hematoma and he was gradually improving until 1/17 when he was noted to have worsening mental status again with hypotension.  ABG 7.134 and pCO2 60.7, patient's mental status was not amenable for Bipap, so PCCM consulted for transfer.   Pertinent  Medical History   Past Medical History:  Diagnosis Date   Arthritis    Brain aneurysm    ETOH abuse    intoxicated   Lung cancer ([place])    Significant Hospital Events: Including procedures, antibiotic start and stop dates in addition to other pertinent events   1/11 admission for R hip fx 1/12 R hip arthoplasty 1/13 Sz activity 1/17 worsening mental status, PCCM consult and ICU txfr, intubated  1/18 CT head with stable small bilateral subdural hematomas but no acute intracranial abnormalities. 1/18 EEG >> abnormal comatose EEG, no epileptiform discharges 1/18 MRI brain >> stable bilateral frontal subacute subdural hematoma.  No new intracranial abnormalities. 1/19 CXR>> stable multilobar pneumonia, resolved RUL collapse, continued LLL consolidation/collapse 1/19 cEEG >>BIPDs with some brief electrographic seizures during the initial portion of the  recording, indicative of a multifocal epileptogenic disturbance. Seizures were improved in the afternoon and evening   Interim History / Subjective:  No acute events overnight Had Continuous EEG overnight Eyes open this morning  Objective   Blood pressure 123/75, pulse 83, temperature (!) 97.4 F (36.3 C), temperature source Oral, resp. rate (!) 30, height 5\' 5"  (1.651 m), weight 53.1 kg, SpO2 99 %.    Vent Mode: PRVC FiO2 (%):  [30 %-50 %] 30 % Set Rate:  [28 bmp] 28 bmp Vt Set:  [490 mL] 490 mL PEEP:  [5 cmH20] 5 cmH20 Plateau Pressure:  [20 cmH20-25 cmH20] 20 cmH20   Intake/Output Summary (Last 24 hours) at 07/17/2021 0723 Last data filed at 07/17/2021 0600 Gross per 24 hour  Intake 3882.89 ml  Output 200 ml  Net 3682.89 ml   Filed Weights   06/28/2021 1129 07/15/21 0343 07/16/21 0343  Weight: 45.5 kg 51.9 kg 53.1 kg    Examination: General: Chronically ill, frail elderly man intubated.  NAD HENT: ET tube in place.  Pupils sluggish to light. Lungs: CTAB. Diminished lung sounds. Cardiovascular: RRR.  No murmurs rubs or gallops Abdomen: Soft.  Nondistended.  Normal BS Extremities: Warm and dry. Edematous upper extremities Skin: Right groin incision site C/D/L with some edema around surgical site. Neuro: Somnolent. Eyes open intemittently. No withdrawal to painful stimuli.   Labs reviewed: CBGs: 72,110,122 Na 129<--138, sCr 1.93<-1.54 WBC 19.8, hgb 8.3  Resolved Hospital Problem list   Hypernatremia Respiratory acidosis Assessment & Plan:  Acute metabolic encephalopathy Chronic subdural hematoma History of seizure Occasionally opens eyes but no significant change in mental  status. cEEG shows some brief electrographic seizures yesterday morning that improved later in the day. Off sedation.  --Neuro following, appreciate rec --Continue cEEG  Acute hypoxic hypercarbic respiratory failure Aspiration pneumonitis On minimal vent settings. Intermittent fevers in the  last 48hr with worsening leukocytosis. Changed abx yesterday to target Enterobacter.  --Continue full vent support --Continue cefepime --Monitor WBC, fever curve --VAP bundle  Septic shock Patient went of levophed early this morning. MAP dropped to the 50s so he was put back on Levophed.  --Resume Levophed, wean for MAP >65 --Continue midodrine 5 mg TID, we have room to go up  AKI on CKD stage IIIa None anion gap metabolic acidosis Hyponatremia Renal function continues to worsen. Net I/O ~+11 L. Still with minimal UOP. Very edematous on exam. Bicarb still trending down. Significant drop in sodium from 138->129. Will discontinue hypotonic fluids and monitor.  --Check U/S renal --Bladder scan --Increase bicarb to 650 mg QID --Discontinue D5LR --Strict I&O's  Close displaced fracture of the right femoral neck Status post right hip total arthroplasty on 1/12 by orthopedic surgery.  Surgical site with mild edema covered with dressing. C/d/l --Continue pain control  Severe protein caloric malnutrition Failure to thrive Hypoglycemia Patient with intermittent hypoglycemic episodes during hospitalization. TF current at goal. CBGS improving.  --Continue TF --Closely monitor CBGs --D/c D5LR   Best Practice (right click and "Reselect all SmartList Selections" daily)   Diet/type: NPO DVT prophylaxis: systemic heparin GI prophylaxis: PPI Lines: Central line and Arterial Line Foley:  Yes, and it is still needed Code Status:  full code Last date of multidisciplinary goals of care discussion [1/18, spoke to daughter at bedside]  Labs   CBC: Recent Labs  Lab 07/14/21 0210 07/14/21 2245 07/14/21 2326 07/15/21 7893 07/15/21 0308 07/16/21 0203 07/16/21 0429 07/17/21 0319  WBC 9.4 7.3  --  2.7*  --  14.2*  --  19.8*  HGB 10.5* 9.6*   < > 9.7* 9.2* 8.8* 8.5* 8.3*  HCT 32.9* 30.3*   < > 29.7* 27.0* 26.3* 25.0* 25.6*  MCV 90.1 92.1  --  90.5  --  88.9  --  89.2  PLT 352 326  --   287  --  243  --  268   < > = values in this interval not displayed.    Basic Metabolic Panel: Recent Labs  Lab 07/13/21 0019 07/14/21 0210 07/14/21 2245 07/14/21 2326 07/15/21 8101 07/15/21 0308 07/16/21 0203 07/16/21 0429 07/17/21 0319  NA 147* 145 140   < > 141 144 135 138 129*  K 4.3 4.5 4.4   < > 4.2 4.2 4.4 4.6 4.6  CL 121* 122* 118*  --  119*  --  113*  --  108  CO2 16* 18* 18*  --  17*  --  16*  --  14*  GLUCOSE 82 126* 145*  --  89  --  104*  --  126*  BUN 37* 28* 28*  --  28*  --  29*  --  40*  CREATININE 1.44* 1.27* 1.26*  --  1.24  --  1.54*  --  1.93*  CALCIUM 8.1* 7.7* 7.3*  --  7.2*  --  7.1*  --  7.1*  MG 1.9 1.6*  --   --  1.5*  --  2.3  --  2.2  PHOS 3.3 2.5  --   --  4.2  --  3.4  --  3.2   < > = values in this  interval not displayed.   GFR: Estimated Creatinine Clearance: 22.9 mL/min (A) (by C-G formula based on SCr of 1.93 mg/dL (H)). Recent Labs  Lab 07/14/21 2245 07/14/21 2320 07/15/21 0145 07/15/21 0208 07/16/21 0203 07/17/21 0319  WBC 7.3  --   --  2.7* 14.2* 19.8*  LATICACIDVEN  --  0.9 1.2  --   --   --     Liver Function Tests: Recent Labs  Lab 07/11/21 0248 07/11/21 0632 07/11/21 0744 07/14/21 2245  AST 23 23 22 28   ALT 8 8 8 10   ALKPHOS 97 100 101 183*  BILITOT 0.4 0.5 0.5 0.7  PROT 5.1* 5.1* 5.2* 6.0*  ALBUMIN 1.5* 1.5* 1.5* <1.5*   No results for input(s): LIPASE, AMYLASE in the last 168 hours. No results for input(s): AMMONIA in the last 168 hours.  ABG    Component Value Date/Time   PHART 7.278 (L) 07/16/2021 0429   PCO2ART 35.0 07/16/2021 0429   PO2ART 99 07/16/2021 0429   HCO3 16.2 (L) 07/16/2021 0429   TCO2 17 (L) 07/16/2021 0429   ACIDBASEDEF 10.0 (H) 07/16/2021 0429   O2SAT 96.0 07/16/2021 0429     Coagulation Profile: Recent Labs  Lab 07/14/21 2245  INR 1.2    Cardiac Enzymes: No results for input(s): CKTOTAL, CKMB, CKMBINDEX, TROPONINI in the last 168 hours.  HbA1C: No results found for:  HGBA1C  CBG: Recent Labs  Lab 07/16/21 1155 07/16/21 1739 07/16/21 1911 07/16/21 2304 07/17/21 0316  GLUCAP 100* 120* 101* 72 110*   Critical care time: 30

## 2021-07-17 NOTE — Procedures (Signed)
Cortrak  Person Inserting Tube:  Briarrose Shor, Creola Corn, RD Tube Type:  Cortrak - 43 inches Tube Size:  10 Tube Location:  Right nare Initial Placement:  Postpyloric Secured by: Bridle Technique Used to Measure Tube Placement:  Marking at nare/corner of mouth Cortrak Secured At:  100 cm  Cortrak Tube Team Note:  Consult received to place a Cortrak feeding tube.   X-ray is required, abdominal x-ray has been ordered by the Cortrak team. Please confirm tube placement before using the Cortrak tube.   If the tube becomes dislodged please keep the tube and contact the Cortrak team at [email or web address] ([email or web address]) for replacement.  If after hours and replacement cannot be delayed, place a NG tube and confirm placement with an abdominal x-ray.     Theone Stanley., MS, RD, LDN (she/her/hers) RD pager number and weekend/on-call pager number located in [place].

## 2021-07-17 NOTE — Progress Notes (Signed)
Post ABG titrated slowly to peep of 4. Sats went from 84 to 93%. MD aware. Will repeat abg in an hour to reassess. Fio2 remains at 100%

## 2021-07-18 ENCOUNTER — Inpatient Hospital Stay (HOSPITAL_COMMUNITY): Payer: Medicare HMO

## 2021-07-18 DIAGNOSIS — J9602 Acute respiratory failure with hypercapnia: Secondary | ICD-10-CM

## 2021-07-18 DIAGNOSIS — J9601 Acute respiratory failure with hypoxia: Secondary | ICD-10-CM

## 2021-07-18 LAB — ECHOCARDIOGRAM COMPLETE
AR max vel: 3.75 cm2
AV Area VTI: 2.92 cm2
AV Area mean vel: 3.32 cm2
AV Mean grad: 3 mmHg
AV Peak grad: 5.2 mmHg
Ao pk vel: 1.14 m/s
Height: 65 in
S' Lateral: 3.4 cm
Weight: 1703.71 oz

## 2021-07-18 LAB — LACTIC ACID, PLASMA
Lactic Acid, Venous: 1.4 mmol/L (ref 0.5–1.9)
Lactic Acid, Venous: 1.7 mmol/L (ref 0.5–1.9)

## 2021-07-18 LAB — POCT I-STAT 7, (LYTES, BLD GAS, ICA,H+H)
Acid-base deficit: 11 mmol/L — ABNORMAL HIGH (ref 0.0–2.0)
Bicarbonate: 16.3 mmol/L — ABNORMAL LOW (ref 20.0–28.0)
Calcium, Ion: 1.16 mmol/L (ref 1.15–1.40)
HCT: 32 % — ABNORMAL LOW (ref 39.0–52.0)
Hemoglobin: 10.9 g/dL — ABNORMAL LOW (ref 13.0–17.0)
O2 Saturation: 91 %
Patient temperature: 99.7
Potassium: 4.9 mmol/L (ref 3.5–5.1)
Sodium: 132 mmol/L — ABNORMAL LOW (ref 135–145)
TCO2: 18 mmol/L — ABNORMAL LOW (ref 22–32)
pCO2 arterial: 43.1 mmHg (ref 32.0–48.0)
pH, Arterial: 7.19 — CL (ref 7.350–7.450)
pO2, Arterial: 77 mmHg — ABNORMAL LOW (ref 83.0–108.0)

## 2021-07-18 LAB — COMPREHENSIVE METABOLIC PANEL
ALT: 13 U/L (ref 0–44)
AST: 38 U/L (ref 15–41)
Albumin: <1.5 g/dL — ABNORMAL LOW (ref 3.5–5.0)
Alkaline Phosphatase: 153 U/L — ABNORMAL HIGH (ref 38–126)
Anion gap: 9 (ref 5–15)
BUN: 52 mg/dL — ABNORMAL HIGH (ref 8–23)
CO2: 17 mmol/L — ABNORMAL LOW (ref 22–32)
Calcium: 7.4 mg/dL — ABNORMAL LOW (ref 8.9–10.3)
Chloride: 106 mmol/L (ref 98–111)
Creatinine, Ser: 2.48 mg/dL — ABNORMAL HIGH (ref 0.61–1.24)
GFR, Estimated: 26 mL/min — ABNORMAL LOW (ref >60)
Glucose, Bld: 82 mg/dL (ref 70–99)
Potassium: 4.9 mmol/L (ref 3.5–5.1)
Sodium: 132 mmol/L — ABNORMAL LOW (ref 135–145)
Total Bilirubin: 0.5 mg/dL (ref 0.3–1.2)
Total Protein: 5.3 g/dL — ABNORMAL LOW (ref 6.5–8.1)

## 2021-07-18 LAB — CULTURE, RESPIRATORY W GRAM STAIN

## 2021-07-18 LAB — CBC
HCT: 26.1 % — ABNORMAL LOW (ref 39.0–52.0)
Hemoglobin: 8.5 g/dL — ABNORMAL LOW (ref 13.0–17.0)
MCH: 29.1 pg (ref 26.0–34.0)
MCHC: 32.6 g/dL (ref 30.0–36.0)
MCV: 89.4 fL (ref 80.0–100.0)
Platelets: 227 10*3/uL (ref 150–400)
RBC: 2.92 MIL/uL — ABNORMAL LOW (ref 4.22–5.81)
RDW: 18.1 % — ABNORMAL HIGH (ref 11.5–15.5)
WBC: 7 10*3/uL (ref 4.0–10.5)
nRBC: 1.9 % — ABNORMAL HIGH (ref 0.0–0.2)

## 2021-07-18 LAB — GLUCOSE, CAPILLARY
Glucose-Capillary: 104 mg/dL — ABNORMAL HIGH (ref 70–99)
Glucose-Capillary: 60 mg/dL — ABNORMAL LOW (ref 70–99)
Glucose-Capillary: 71 mg/dL (ref 70–99)
Glucose-Capillary: 71 mg/dL (ref 70–99)
Glucose-Capillary: 72 mg/dL (ref 70–99)
Glucose-Capillary: 77 mg/dL (ref 70–99)
Glucose-Capillary: 79 mg/dL (ref 70–99)
Glucose-Capillary: 98 mg/dL (ref 70–99)

## 2021-07-18 LAB — BRAIN NATRIURETIC PEPTIDE: B Natriuretic Peptide: 680 pg/mL — ABNORMAL HIGH (ref 0.0–100.0)

## 2021-07-18 LAB — MAGNESIUM: Magnesium: 2.2 mg/dL (ref 1.7–2.4)

## 2021-07-18 LAB — D-DIMER, QUANTITATIVE: D-Dimer, Quant: 7.04 ug/mL-FEU — ABNORMAL HIGH (ref 0.00–0.50)

## 2021-07-18 LAB — PHOSPHORUS: Phosphorus: 3.7 mg/dL (ref 2.5–4.6)

## 2021-07-18 MED ORDER — VANCOMYCIN VARIABLE DOSE PER UNSTABLE RENAL FUNCTION (PHARMACIST DOSING): Status: DC

## 2021-07-18 MED ORDER — SODIUM CHLORIDE 0.9 % IV SOLN
1.0000 g | Freq: Two times a day (BID) | INTRAVENOUS | Status: DC
  Administered 2021-07-18 – 2021-07-20 (×6): 1 g via INTRAVENOUS
  Filled 2021-07-18 (×6): qty 1

## 2021-07-18 MED ORDER — DEXTROSE 50 % IV SOLN
12.5000 g | INTRAVENOUS | Status: AC
  Administered 2021-07-18: 12.5 g via INTRAVENOUS
  Filled 2021-07-18: qty 50

## 2021-07-18 MED ORDER — VANCOMYCIN HCL 1000 MG/200ML IV SOLN
1000.0000 mg | Freq: Once | INTRAVENOUS | Status: AC
  Administered 2021-07-18: 1000 mg via INTRAVENOUS
  Filled 2021-07-18: qty 200

## 2021-07-18 MED ORDER — DEXTROSE-NACL 5-0.9 % IV SOLN: INTRAVENOUS | Status: AC

## 2021-07-18 NOTE — Progress Notes (Signed)
Pharmacy Antibiotic Note  Gary Stevenson is a 81 y.o. male admitted on 06/30/2021 with pneumonia and sepsis.  Pharmacy has been consulted for vancomycin/merrem dosing.  Renal function worsening, Scr 2.48 Previously on cefepime, concerned for increased seizure risk  Plan: Vancomycin 1000mg  LD x1 followed by pulse dosing due to worsening renal function Cefepime transitioned to meropenem 1gm q12h Plan to obtain vancomycin random on 1/23 to guide further therapy  Will monitor for acute changes in renal function and adjust as needed F/u cultures results and de-escalate as appropriate   Height: 5\' 5"  (165.1 cm) Weight: 48.3 kg (106 lb 7.7 oz) IBW/kg (Calculated) : 61.5  Temp (24hrs), Avg:97.4 F (36.3 C), Min:92.8 F (33.8 C), Max:99.7 F (37.6 C)  Recent Labs  Lab 07/14/21 2245 07/14/21 2320 07/15/21 0145 07/15/21 0208 07/16/21 0203 07/17/21 0319 07/17/21 2330 07/18/21 0258 07/18/21 0610 07/18/21 0625  WBC 7.3  --   --  2.7* 14.2* 19.8*  --   --   --  7.0  CREATININE 1.26*  --   --  1.24 1.54* 1.93*  --   --  2.48*  --   LATICACIDVEN  --  0.9 1.2  --   --   --  1.4 1.7  --   --     Estimated Creatinine Clearance: 16.2 mL/min (A) (by C-G formula based on SCr of 2.48 mg/dL (H)).    No Known Allergies    Microbiology results: 1/18 BCx: IP 1/17 Sputum: Enterobacter cloacae  1/11 MRSA PCR: negative  Thank you for allowing pharmacy to be a part of this patients care.  Donnald Garre, PharmD Clinical Pharmacist  Please check AMION for all [place] numbers After 10:00 PM, call [place] [phone]

## 2021-07-18 NOTE — Progress Notes (Signed)
Brief Neuro Update:  Overnight cEEG with no seizures. Will discontinue LTM. Continue Keppra 500mg  BID.  Recommend alternative to Cefepime. Cefepime will probably cause recurrence of seizures specially with his AKI.  Will order repeat MRI Brain without contrast for tomorrow.  [place] Pager Number [phone]

## 2021-07-18 NOTE — Progress Notes (Signed)
eLink Physician-Brief Progress Note Patient Name: DERRION TRITZ DOB: 07/19/1940 MRN: [id number]   Date of Service  07/18/2021  HPI/Events of Note  Enteral feeding was held and blood sugar is now 60 mg / dl, serum sodium 129.  eICU Interventions  D 5 % NS ordered at 50 ml / hour x 24 hours.        Kerry Kass Rakayla Ricklefs 07/18/2021, 3:38 AM

## 2021-07-18 NOTE — Procedures (Signed)
EEG Procedure CPT/Type of Study: 60109; 24hr EEG with video Referring Provider: Elgergawy Primary Neurological Diagnosis: seizure, status epilepticus, SDH  History: This is a 81 yr old patient, undergoing an EEG to evaluate for SDH, seizures. Clinical State: disoriented  Technical Description:  The EEG was performed using standard setting per the guidelines of American Clinical Neurophysiology Society (ACNS).  A minimum of 21 electrodes were placed on scalp according to the International 10-20 or/and 10-10 Systems. Supplemental electrodes were placed as needed. Single EKG electrode was also used to detect cardiac arrhythmia. Patient's behavior was continuously recorded on video simultaneously with EEG. A minimum of 16 channels were used for data display. Each epoch of study was reviewed manually daily and as needed using standard referential and bipolar montages. Computerized quantitative EEG analysis (such as compressed spectral array analysis, trending, automated spike & seizure detection) were used as indicated.   Day 2: from 0730 07/17/21 to 0730 07/18/21  EEG Description: Overall Amplitude:Normal Predominant Frequency: The background activity showed theta, with about 4-6 Hz, that was frequent. Superimposed Frequencies: occasional delta and some beta activity bilaterally The background was symmetric  Background Abnormalities: focal slowing; right central delta-theta focal slowing Rhythmic or periodic pattern: Bilateral independent periodic discharges; spiky 1-2hz  BIPDs  Epileptiform activity: Yes; spiky morphology to BIPDs Electrographic seizures: No Events: no   Breach rhythm: no  Reactivity: Present  Stimulation procedures:  Hyperventilation: not done Photic stimulation: not done  Sleep Background: Stage II  EKG:no significant arrhythmia  Impression: This was an abnormal continuous video EEG due to BIPDs with epileptiform morphology, indicative of a multifocal  epileptogenic disturbance. No discrete seizures were seen.

## 2021-07-18 NOTE — Progress Notes (Signed)
°  Echocardiogram 2D Echocardiogram has been performed.  Gary Killings 07/18/2021, 9:10 AM

## 2021-07-18 NOTE — Progress Notes (Signed)
LTM discontinued at bedside. No skin breakdown noted. Results pending.

## 2021-07-18 NOTE — Progress Notes (Signed)
NAME:  Gary Stevenson, MRN:  [id number], DOB:  1941-03-01, LOS: 80 ADMISSION DATE:  07/19/2021, CONSULTATION DATE: 07/14/2021 REFERRING MD:  Gary Stevenson, CHIEF COMPLAINT:  AMS, Respiratory failure   History of Present Illness:  Gary Stevenson is a 81 y.o. M with PMH significant for right frontal subdural hematomas status post surgical evacuation in 2012, lung cancer status post lobectomy, alcohol abuse, CKD who presented to the ED 1/12 after a fall and was found to have R acetabular fx with interval displacement and underwent R total hip arthroplasty on 07/26/2021.  He was later found with decreased responsiveness and seizure activity that lasted for around 1 minute.  He was seen by Neurology and started on Phenobarbital and Keppra with LTM EEG.  Neurology felt seizure was most likely secondary to subdural hematoma and he was gradually improving until 1/17 when he was noted to have worsening mental status again with hypotension.  ABG 7.134 and pCO2 60.7, patient's mental status was not amenable for Bipap, so PCCM consulted for transfer.   Pertinent  Medical History   Past Medical History:  Diagnosis Date   Arthritis    Brain aneurysm    ETOH abuse    intoxicated   Lung cancer ([place])    Significant Hospital Events: Including procedures, antibiotic start and stop dates in addition to other pertinent events   1/11 admission for R hip fx 1/12 R hip arthoplasty 1/13 Sz activity 1/17 worsening mental status, PCCM consult and ICU txfr, intubated  1/18 CT head with stable small bilateral subdural hematomas but no acute intracranial abnormalities. 1/18 EEG >> abnormal comatose EEG, no epileptiform discharges 1/18 MRI brain >> stable bilateral frontal subacute subdural hematoma.  No new intracranial abnormalities. 1/19 CXR>> stable multilobar pneumonia, resolved RUL collapse, continued LLL consolidation/collapse 1/19 cEEG >>BIPDs with some brief electrographic seizures during the initial portion of the  recording, indicative of a multifocal epileptogenic disturbance. Seizures were improved in the afternoon and evening    Interim History / Subjective:   Worsening shock overnight with increasing PEEP/FiO2 requirements.  He is now anuric with worsening acidosis NG tube placed for emesis and small bowel obstruction  Objective   Blood pressure 116/67, pulse 99, temperature 99.1 F (37.3 C), temperature source Axillary, resp. rate (!) 28, height 5\' 5"  (1.651 m), weight 48.3 kg, SpO2 99 %.    Vent Mode: PRVC FiO2 (%):  [50 %-100 %] 75 % Set Rate:  [28 bmp] 28 bmp Vt Set:  [490 mL] 490 mL PEEP:  [4 cmH20-8 cmH20] 5 cmH20 Plateau Pressure:  [20 cmH20-26 cmH20] 20 cmH20   Intake/Output Summary (Last 24 hours) at 07/18/2021 0901 Last data filed at 07/18/2021 0800 Gross per 24 hour  Intake 1298.17 ml  Output 1730 ml  Net -431.83 ml   Filed Weights   07/15/21 0343 07/16/21 0343 07/18/21 0616  Weight: 51.9 kg 53.1 kg 48.3 kg    Examination: Gen:      No acute distress HEENT:  EOMI, sclera anicteric Neck:     No masses; no thyromegaly, ET tube Lungs:    Clear to auscultation bilaterally; normal respiratory effort CV:         Regular rate and rhythm; no murmurs Abd:      + bowel sounds; soft, non-tender; no palpable masses, no distension Ext:    No edema; adequate peripheral perfusion Skin:      Warm and dry; no rash Neuro: Unresponsive  Labs/imaging reviewed Significant for ABG with pH 7.19 Sodium 132,  creatinine 2.48, hemoglobin 8.5 D-dimer 7.04 No new imaging  Resolved Hospital Problem list   Hypernatremia Respiratory acidosis  Assessment & Plan:  Acute metabolic encephalopathy Chronic subdural hematoma History of seizure Continues to have poor mental status of sedation Appreciate neurology recommendation EEG to be discontinued.  Plan on MRI tomorrow  Acute hypoxic hypercarbic respiratory failure Aspiration pneumonitis Has worsening respiratory status with shock,  increased vent requirements V/Q ordered overnight for elevated D-dimer but I suspect this is due to multiorgan failure We will hold off on VQ scan for now given instability with severe acidosis.  Will reconsider if echocardiogram shows severe LV dysfunction Continue cefepime, add vancomycin  Septic shock He is back on Levophed and vasopressin.  Wean as tolerated.  AKI on CKD stage IIIa Is now anuric with severe acidosis Check lactic acid Not a good candidate for dialysis.  See goals of care discussion below  Ileus Tube feeds on hold.  Continue NG tube to suction  Close displaced fracture of the right femoral neck Status post right hip total arthroplasty on 1/12 by orthopedic surgery.  Surgical site with mild edema covered with dressing. C/d/l Continue pain control  Severe protein caloric malnutrition Failure to thrive Hypoglycemia Tube feeds on hold  Goals of care Discussed with Gary Stevenson daughter at bedside.  I reviewed clinical course over the past 24 hours with concern for worsening multiorgan failure with new septic shock, anuric renal failure.  We discussed that prognosis for recovery is extremely poor.  Gary Stevenson has agreed to DNR. She also agreed that he is not a candidate for dialysis and will not escalate I have recommended her to speak with family and transition to comfort measures and she is considering this  Best Practice (right click and "Reselect all SmartList Selections" daily)   Diet/type: NPO DVT prophylaxis: systemic heparin GI prophylaxis: PPI Lines: Central line and Arterial Line Foley:  Yes, and it is still needed Code Status:  full code Last date of multidisciplinary goals of care discussion [1/21, spoke to daughter at bedside]  Critical care time:    The patient is critically ill with multiple organ system failure and requires high complexity decision making for assessment and support, frequent evaluation and titration of therapies, advanced monitoring,  review of radiographic studies and interpretation of complex data.   Critical Care Time devoted to patient care services, exclusive of separately billable procedures, described in this note is 55 minutes.   Gary Garfinkel MD [facility] See Amion for pager  If no response to pager , please call [phone] until 7pm After 7:00 pm call [place]  [phone] 07/18/2021, 9:01 AM

## 2021-07-19 ENCOUNTER — Inpatient Hospital Stay (HOSPITAL_COMMUNITY): Payer: Medicare HMO

## 2021-07-19 LAB — GLUCOSE, CAPILLARY
Glucose-Capillary: 96 mg/dL (ref 70–99)
Glucose-Capillary: 89 mg/dL (ref 70–99)
Glucose-Capillary: 83 mg/dL (ref 70–99)
Glucose-Capillary: 72 mg/dL (ref 70–99)
Glucose-Capillary: 82 mg/dL (ref 70–99)
Glucose-Capillary: 88 mg/dL (ref 70–99)

## 2021-07-19 LAB — BASIC METABOLIC PANEL
Anion gap: 11 (ref 5–15)
BUN: 53 mg/dL — ABNORMAL HIGH (ref 8–23)
CO2: 14 mmol/L — ABNORMAL LOW (ref 22–32)
Calcium: 7.3 mg/dL — ABNORMAL LOW (ref 8.9–10.3)
Chloride: 111 mmol/L (ref 98–111)
Creatinine, Ser: 2.14 mg/dL — ABNORMAL HIGH (ref 0.61–1.24)
GFR, Estimated: 31 mL/min — ABNORMAL LOW (ref >60)
Glucose, Bld: 103 mg/dL — ABNORMAL HIGH (ref 70–99)
Potassium: 4 mmol/L (ref 3.5–5.1)
Sodium: 136 mmol/L (ref 135–145)

## 2021-07-19 LAB — CBC
HCT: 22.3 % — ABNORMAL LOW (ref 39.0–52.0)
Hemoglobin: 7.7 g/dL — ABNORMAL LOW (ref 13.0–17.0)
MCH: 29.4 pg (ref 26.0–34.0)
MCHC: 34.5 g/dL (ref 30.0–36.0)
MCV: 85.1 fL (ref 80.0–100.0)
Platelets: 222 10*3/uL (ref 150–400)
RBC: 2.62 MIL/uL — ABNORMAL LOW (ref 4.22–5.81)
RDW: 17.8 % — ABNORMAL HIGH (ref 11.5–15.5)
WBC: 8.5 10*3/uL (ref 4.0–10.5)
nRBC: 2.4 % — ABNORMAL HIGH (ref 0.0–0.2)

## 2021-07-19 LAB — POCT I-STAT 7, (LYTES, BLD GAS, ICA,H+H)
Acid-base deficit: 9 mmol/L — ABNORMAL HIGH (ref 0.0–2.0)
Bicarbonate: 15.1 mmol/L — ABNORMAL LOW (ref 20.0–28.0)
Calcium, Ion: 1.19 mmol/L (ref 1.15–1.40)
HCT: 28 % — ABNORMAL LOW (ref 39.0–52.0)
Hemoglobin: 9.5 g/dL — ABNORMAL LOW (ref 13.0–17.0)
O2 Saturation: 98 %
Potassium: 4.1 mmol/L (ref 3.5–5.1)
Sodium: 137 mmol/L (ref 135–145)
TCO2: 16 mmol/L — ABNORMAL LOW (ref 22–32)
pCO2 arterial: 27.3 mmHg — ABNORMAL LOW (ref 32.0–48.0)
pH, Arterial: 7.351 (ref 7.350–7.450)
pO2, Arterial: 111 mmHg — ABNORMAL HIGH (ref 83.0–108.0)

## 2021-07-19 LAB — PHOSPHORUS: Phosphorus: 3.6 mg/dL (ref 2.5–4.6)

## 2021-07-19 LAB — LACTIC ACID, PLASMA: Lactic Acid, Venous: 1.3 mmol/L (ref 0.5–1.9)

## 2021-07-19 LAB — MAGNESIUM: Magnesium: 2.1 mg/dL (ref 1.7–2.4)

## 2021-07-19 IMAGING — MR MR MRA HEAD W/O CM
2 series · 18 of 48 positions shown · non-contrast
Comparison: None.

CLINICAL DATA: Initial evaluation for neuro deficit, stroke.

EXAM:
MRA NECK WITHOUT CONTRAST
MRA HEAD WITHOUT CONTRAST
TECHNIQUE: Angiographic images of the Circle of Willis were acquired using MRA
technique without intravenous contrast.

[Series 2: ax (id) · axial · 1.0mm · 0.43mm/px · z∈[-65,+16]mm · 17 of 176 slices shown]
[im 1/176]
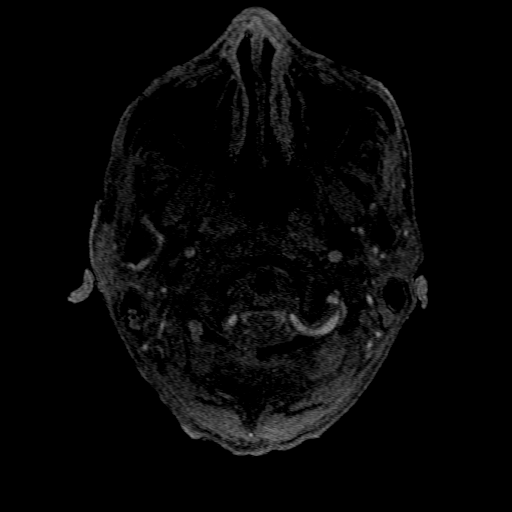
[im 4/176]
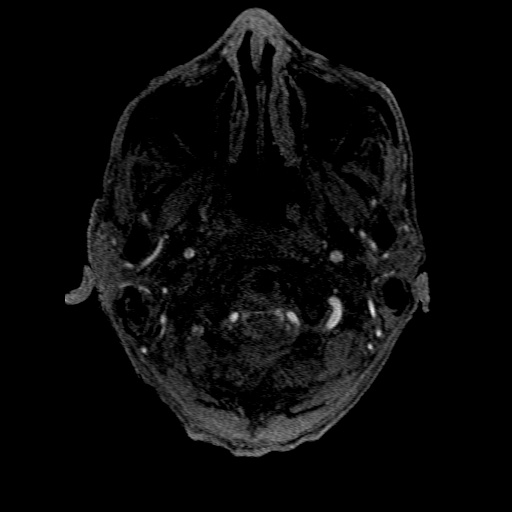
[im 8/176]
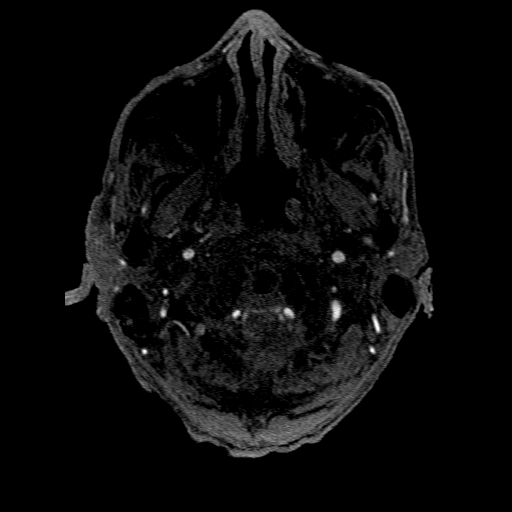
[im 12/176]
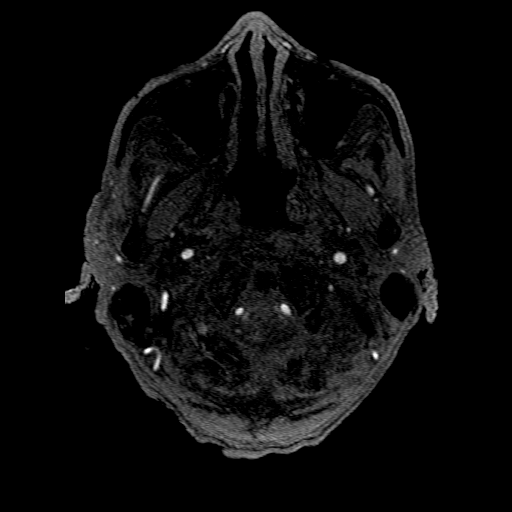
[im 16/176]
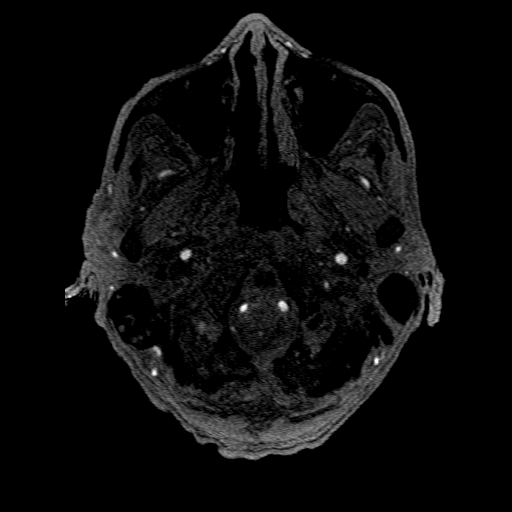
[im 20/176]
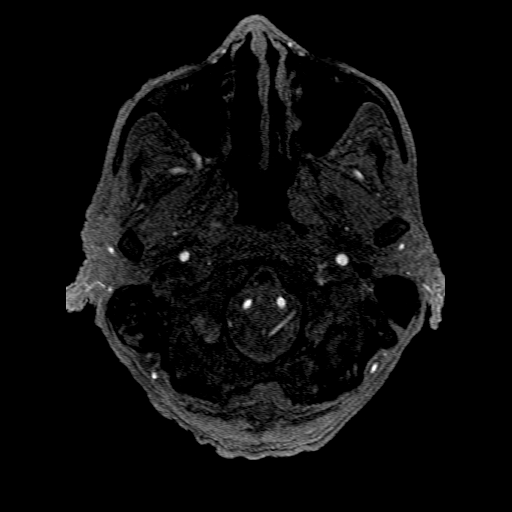
[im 23/176]
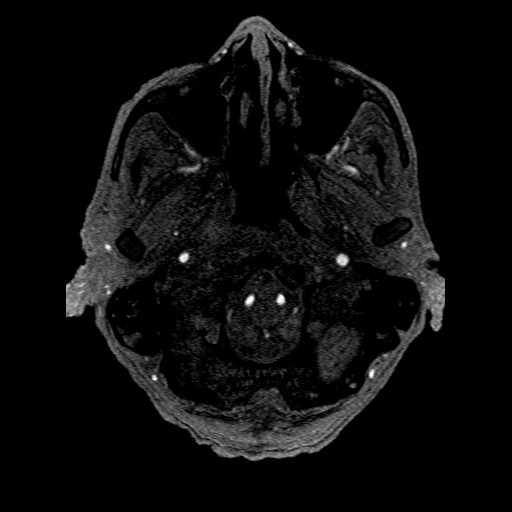
[im 27/176]
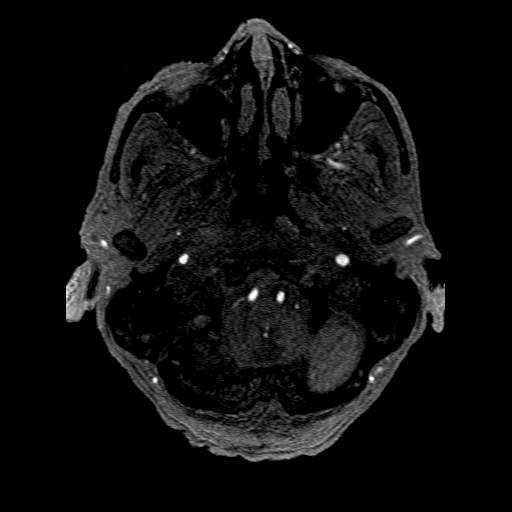
[im 31/176]
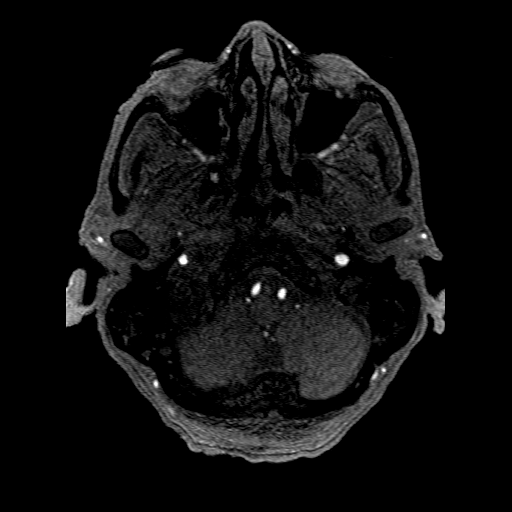
[im 54/176]
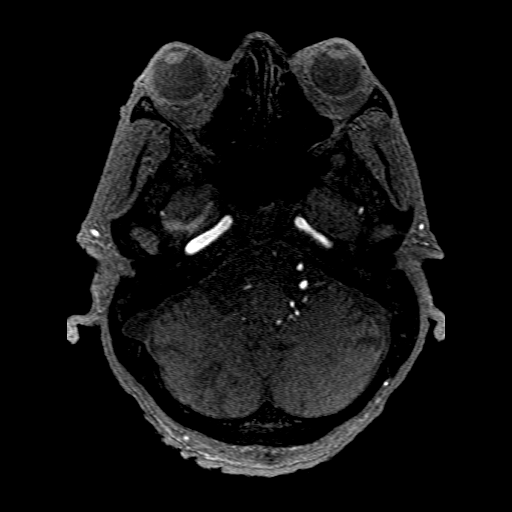
[im 77/176]
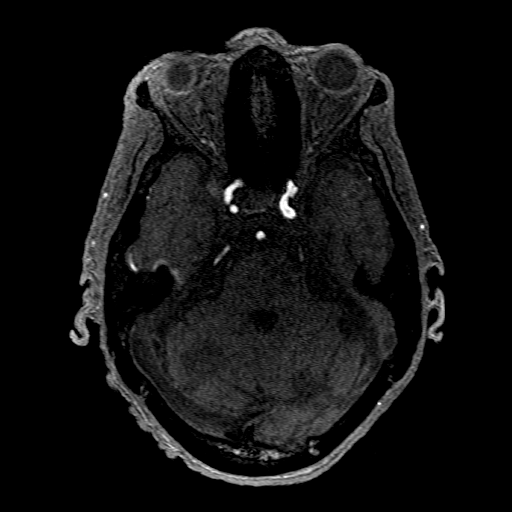
[im 88/176]
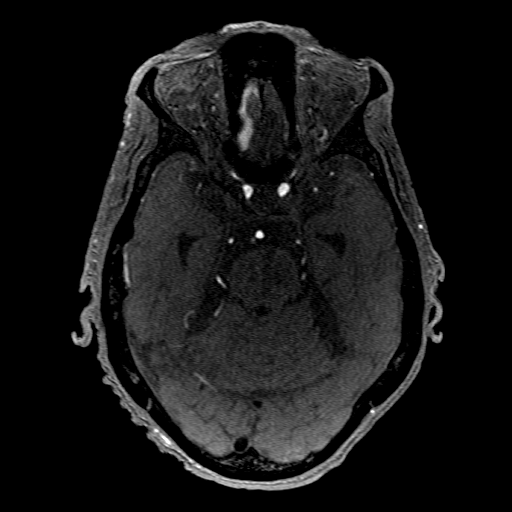
[im 99/176]
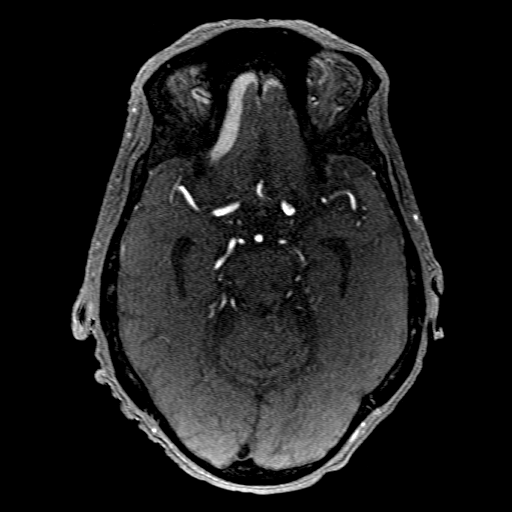
[im 122/176]
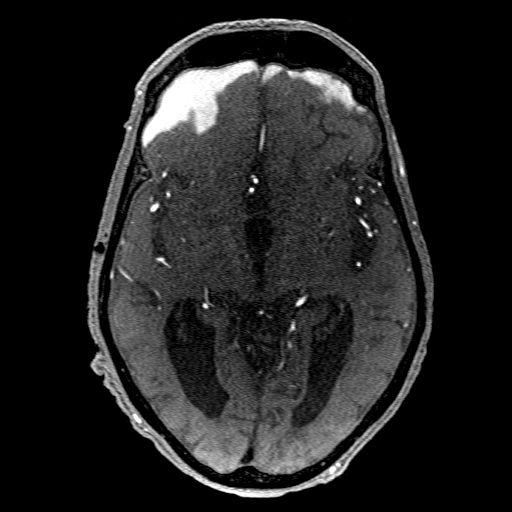
[im 145/176]
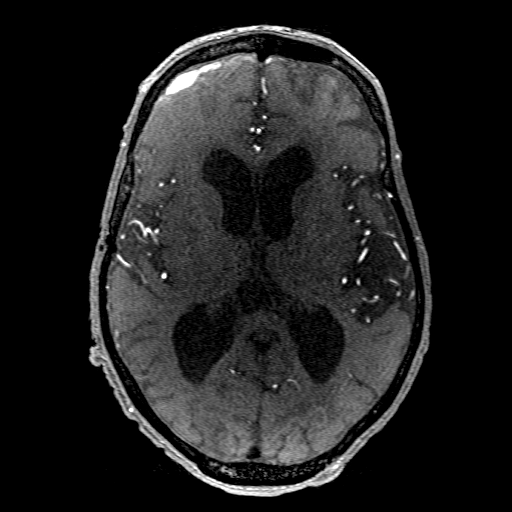
[im 149/176]
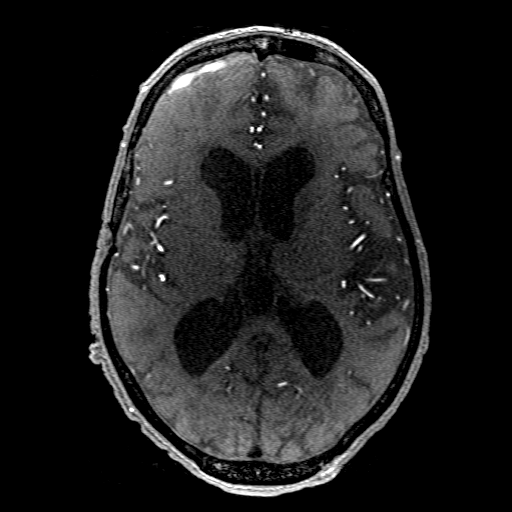
[im 168/176]
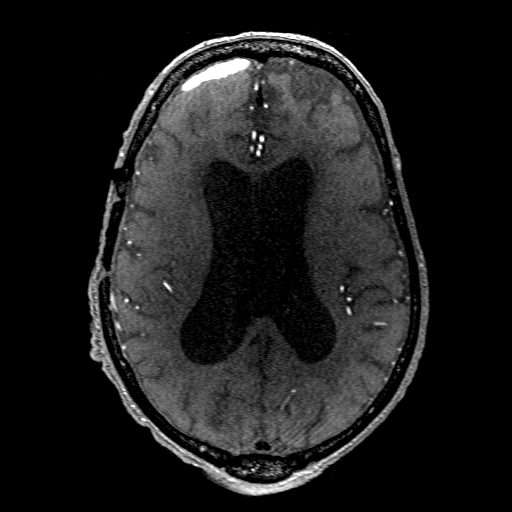

[Series 201: pjn:ax (id) · sagittal · 1.0mm · 0.43mm/px · 1 of 3 slices shown]
[im 1/3]
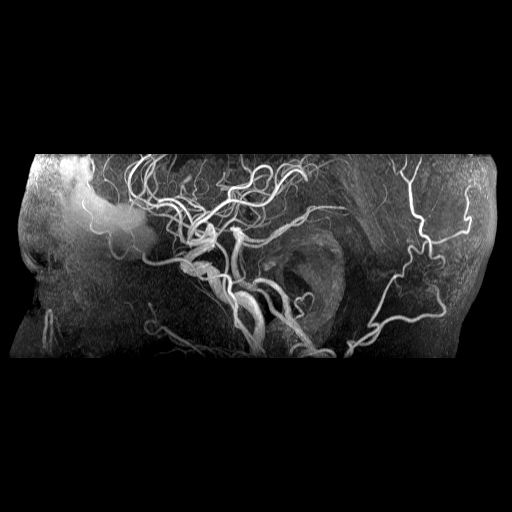

[18 of 48 positions shown; findings below may reference images not displayed]

FINDINGS: MRA NECK FINDINGS

AORTIC ARCH: Examination technically limited by lack of IV contrast.

Aortic arch and origin of the great vessels not well seen on this
exam.

RIGHT CAROTID SYSTEM: Visualized right CCA patent from its origin to
the bifurcation without stenosis. Atheromatous irregularity about
the right carotid bulb/proximal right ICA without hemodynamically
significant stenosis. Right ICA patent distally without stenosis or
evidence for dissection.

LEFT CAROTID SYSTEM: Left CCA patent from its origin to the
bifurcation without stenosis. Atheromatous irregularity about the
left carotid bulb/proximal left ICA without hemodynamically
significant stenosis. Left ICA patent distally without stenosis or
evidence for dissection.

VERTEBRAL ARTERIES: Both vertebral arteries arise from subclavian
arteries. No visible proximal subclavian artery stenosis. Vertebral
arteries largely code dominant and patent within the neck without
stenosis or evidence for dissection.

MRA HEAD FINDINGS

ANTERIOR CIRCULATION:

Visualized distal cervical segments of the internal carotid arteries
are patent with antegrade flow. Petrous, cavernous, and supraclinoid
segments patent without stenosis. Approximate 5 mm wide necked
outpouching arising from the cavernous left ICA suspicious for
aneurysm (series 2, image 76). A1 segments patent bilaterally.
Normal anterior communicating artery complex. Anterior cerebral
arteries patent without stenosis. Normal in stenosis or occlusion.
Normal MCA bifurcations. Distal MCA branches well perfused and
symmetric.

POSTERIOR CIRCULATION:

Both V4 segments patent without stenosis. Both PICA origins patent
and normal. Basilar patent without stenosis. Superior cerebellar
arteries patent bilaterally. Both PCAs primarily supplied via the
basilar well perfused or distal aspects.

Extra-axial collections overlying the right greater than left
frontal convexities again noted, grossly similar. Scattered ischemic
infarcts also grossly similar to prior.
IMPRESSION: MRA HEAD IMPRESSION:

1. Negative MRA for large vessel occlusion. No hemodynamically
significant or correctable stenosis.
2. 5 mm wide necked outpouching extending from the cavernous left
ICA, suspicious for possible aneurysm.

MRA NECK IMPRESSION:

1. Atheromatous irregularity about the left greater than right
carotid bifurcations without hemodynamically significant stenosis.
2. Wide patency of both vertebral arteries within the neck.
3. No evidence for dissection or other acute vascular abnormality.
No identifiable embolic source.

## 2021-07-19 IMAGING — DX DG CHEST 1V PORT
1 series · 1 of 1 positions shown · non-contrast
Comparison: Chest radiograph [DATE].

CLINICAL DATA: Acute respiratory failure

EXAM:
PORTABLE CHEST 1 VIEW

[chest ap]
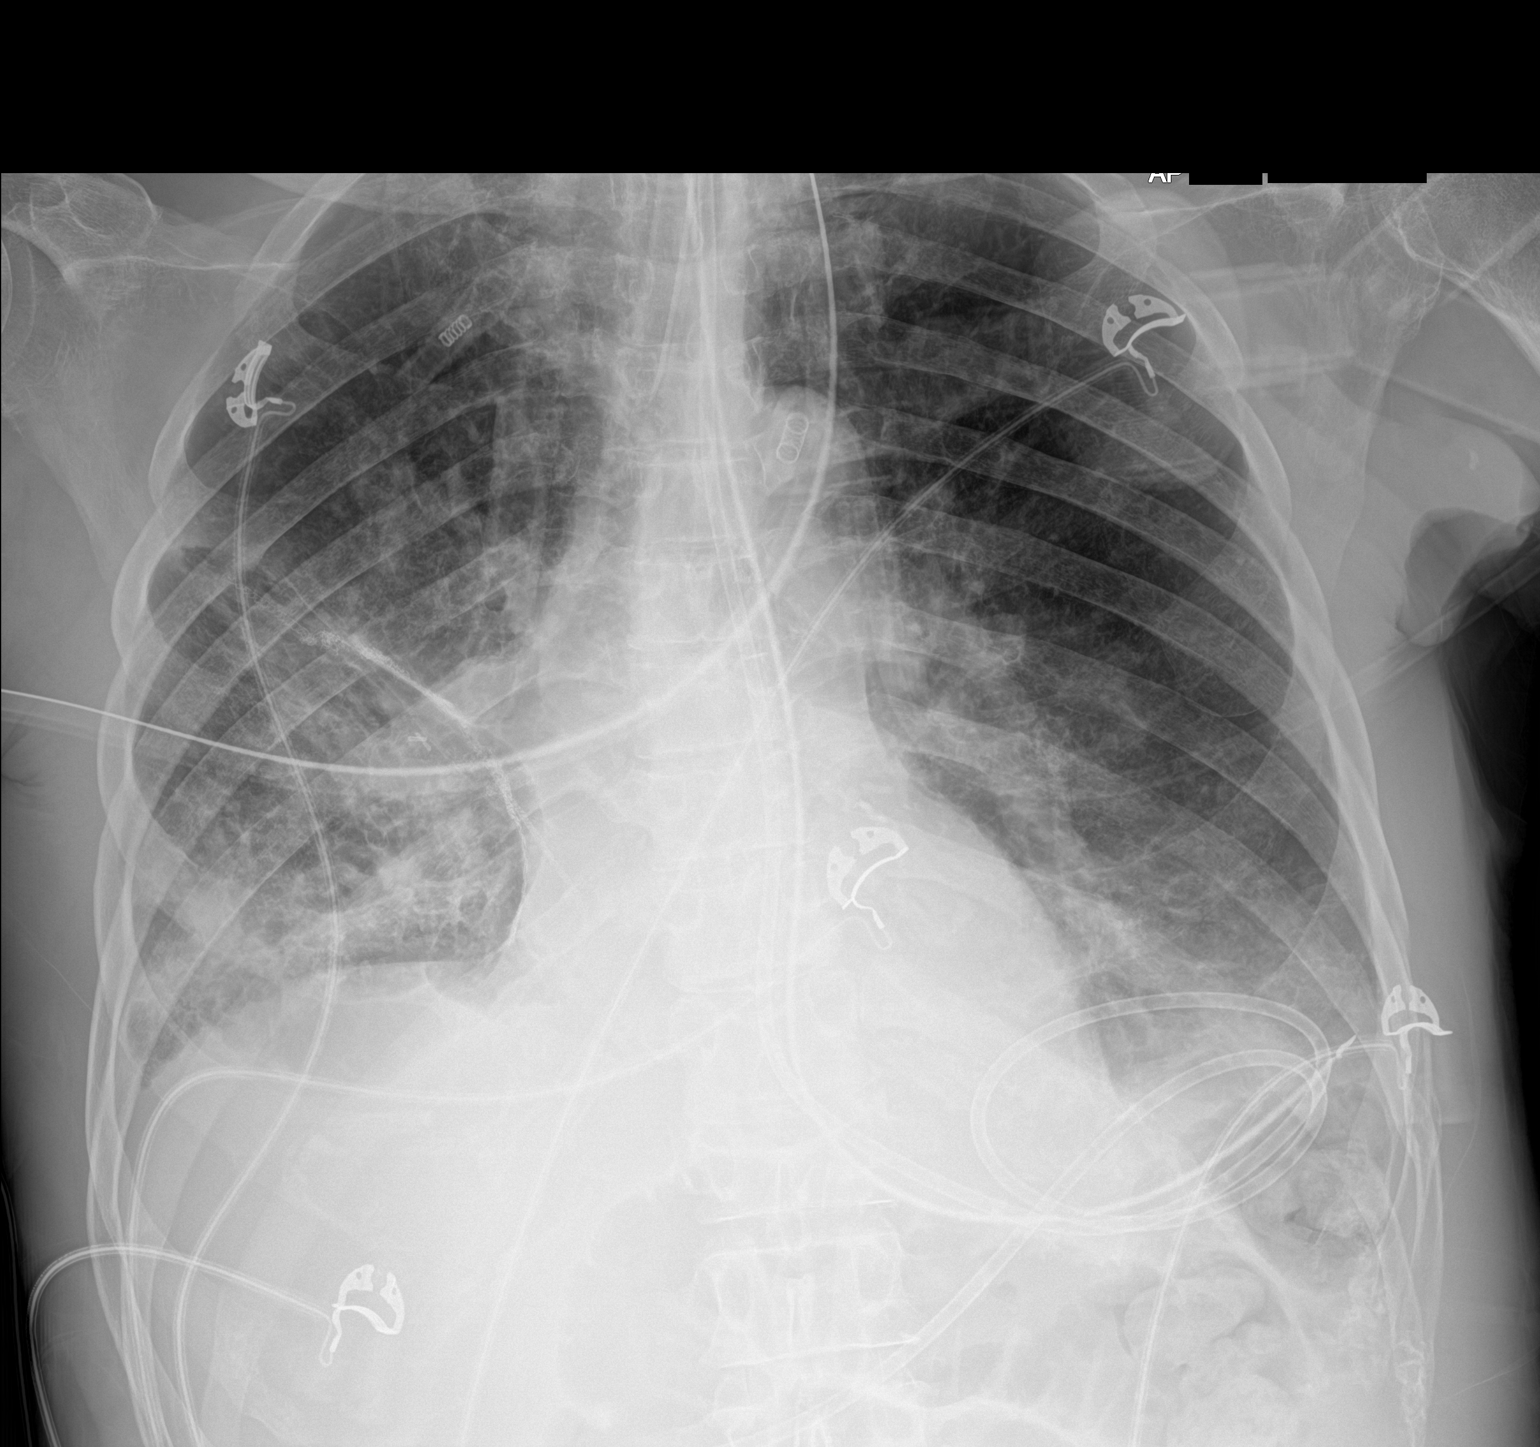

[1 of 1 positions shown; findings below may reference images not displayed]

FINDINGS: ETT distal trachea. Enteric tube courses inferior to the diaphragm.
Monitoring leads overlie the patient. Stable cardiac and mediastinal
contours. Similar-appearing bilateral predominately mid lower lung
airspace opacities. No large pleural effusion or pneumothorax.
IMPRESSION: Similar-appearing bilateral airspace opacities.

ETT distal trachea.

## 2021-07-19 IMAGING — MR MR MRA NECK W/O CM
1 of 3 series · 1 of 48 positions shown · non-contrast
Comparison: None.

CLINICAL DATA: Initial evaluation for neuro deficit, stroke.

EXAM:
MRA NECK WITHOUT CONTRAST
MRA HEAD WITHOUT CONTRAST
TECHNIQUE: Angiographic images of the Circle of Willis were acquired using MRA
technique without intravenous contrast.

[Series 453: projection images · sagittal · 2.4mm · 0.21mm/px · 1 of 3 slices shown]
[im 1/3]
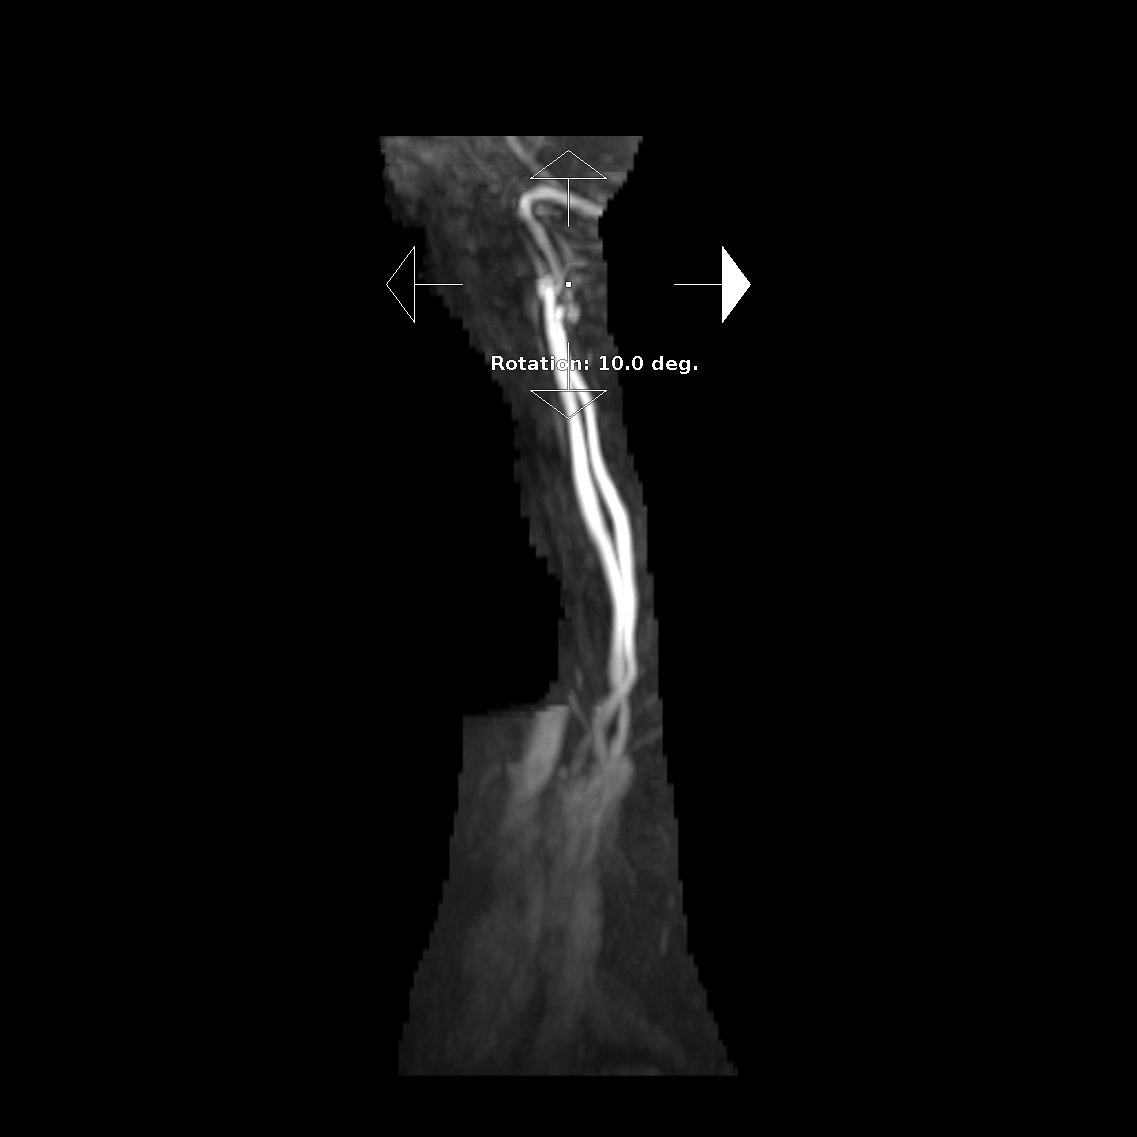

[1 of 48 positions shown; findings below may reference images not displayed]

FINDINGS: MRA NECK FINDINGS

AORTIC ARCH: Examination technically limited by lack of IV contrast.

Aortic arch and origin of the great vessels not well seen on this
exam.

RIGHT CAROTID SYSTEM: Visualized right CCA patent from its origin to
the bifurcation without stenosis. Atheromatous irregularity about
the right carotid bulb/proximal right ICA without hemodynamically
significant stenosis. Right ICA patent distally without stenosis or
evidence for dissection.

LEFT CAROTID SYSTEM: Left CCA patent from its origin to the
bifurcation without stenosis. Atheromatous irregularity about the
left carotid bulb/proximal left ICA without hemodynamically
significant stenosis. Left ICA patent distally without stenosis or
evidence for dissection.

VERTEBRAL ARTERIES: Both vertebral arteries arise from subclavian
arteries. No visible proximal subclavian artery stenosis. Vertebral
arteries largely code dominant and patent within the neck without
stenosis or evidence for dissection.

MRA HEAD FINDINGS

ANTERIOR CIRCULATION:

Visualized distal cervical segments of the internal carotid arteries
are patent with antegrade flow. Petrous, cavernous, and supraclinoid
segments patent without stenosis. Approximate 5 mm wide necked
outpouching arising from the cavernous left ICA suspicious for
aneurysm (series 2, image 76). A1 segments patent bilaterally.
Normal anterior communicating artery complex. Anterior cerebral
arteries patent without stenosis. Normal in stenosis or occlusion.
Normal MCA bifurcations. Distal MCA branches well perfused and
symmetric.

POSTERIOR CIRCULATION:

Both V4 segments patent without stenosis. Both PICA origins patent
and normal. Basilar patent without stenosis. Superior cerebellar
arteries patent bilaterally. Both PCAs primarily supplied via the
basilar well perfused or distal aspects.

Extra-axial collections overlying the right greater than left
frontal convexities again noted, grossly similar. Scattered ischemic
infarcts also grossly similar to prior.
IMPRESSION: MRA HEAD IMPRESSION:

1. Negative MRA for large vessel occlusion. No hemodynamically
significant or correctable stenosis.
2. 5 mm wide necked outpouching extending from the cavernous left
ICA, suspicious for possible aneurysm.

MRA NECK IMPRESSION:

1. Atheromatous irregularity about the left greater than right
carotid bifurcations without hemodynamically significant stenosis.
2. Wide patency of both vertebral arteries within the neck.
3. No evidence for dissection or other acute vascular abnormality.
No identifiable embolic source.

## 2021-07-19 IMAGING — MR MR HEAD W/O CM
10 of 11 series · 39 of 48 positions shown · non-contrast
Comparison: Brain MRI [DATE] and earlier.
COMPARISON: Brain MRI [DATE] and earlier.

Addendum:
CLINICAL DATA: 80-year-old male with altered mental status.
Subdural hematoma. Respiratory failure.

EXAM:
MRI HEAD WITHOUT CONTRAST
TECHNIQUE: Multiplanar, multiecho pulse sequences of the brain and surrounding
structures were obtained without intravenous contrast.

[Series 5: DWI · axial · 3.0mm · 0.88mm/px · z∈[-111,+32]mm · 8 of 100 slices shown (1 of 4)]
[im 1/100]
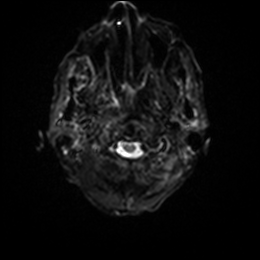
[im 15/100]
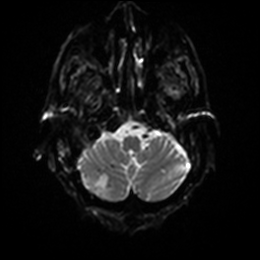
[im 29/100]
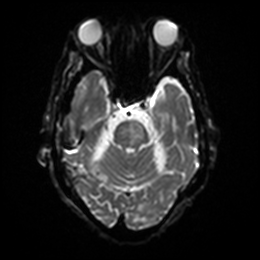
[im 43/100]
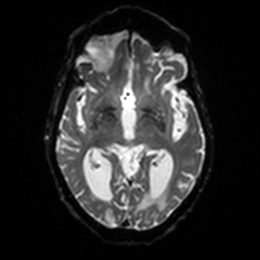
[im 57/100]
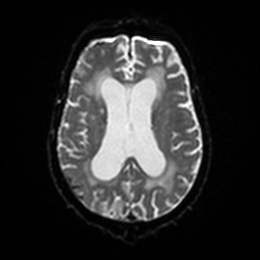
[im 71/100]
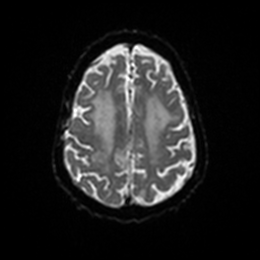
[im 85/100]
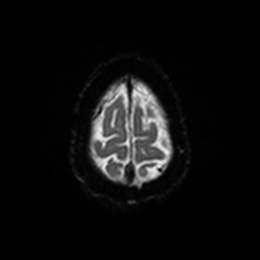
[im 100/100]
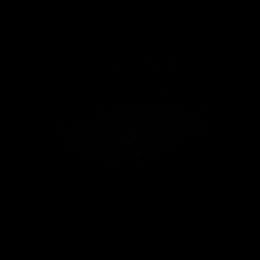

[Series 6: DWI · axial · 3.0mm · 0.88mm/px · z∈[-111,+32]mm · 5 of 50 slices shown (2 of 4)]
[im 1/50]
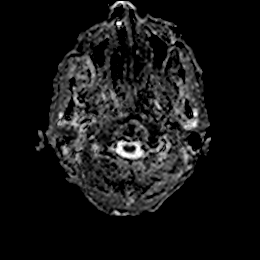
[im 13/50]
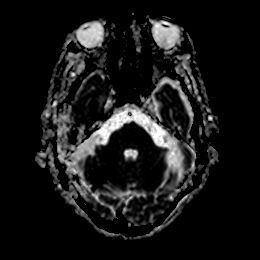
[im 25/50]
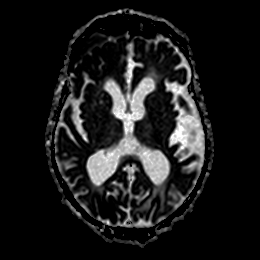
[im 37/50]
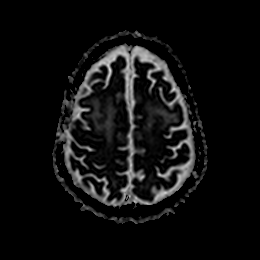
[im 50/50]
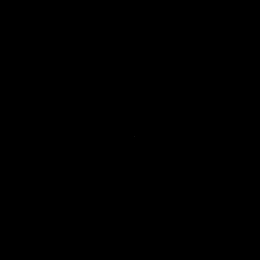

[Series 7: DWI · coronal · 4.0mm · 0.88mm/px · 6 of 64 slices shown (3 of 4)]
[im 1/64]
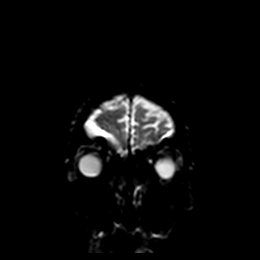
[im 13/64]
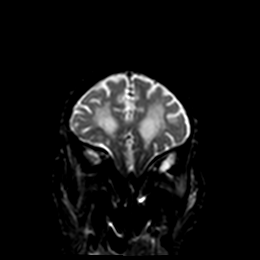
[im 26/64]
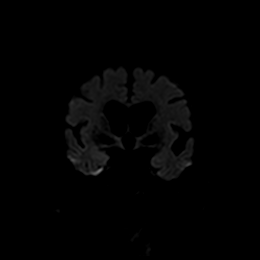
[im 38/64]
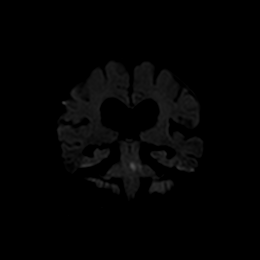
[im 51/64]
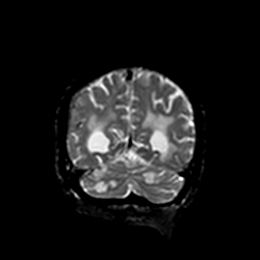
[im 64/64]
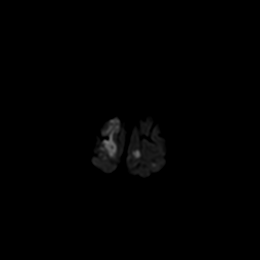

[Series 8: DWI · coronal · 4.0mm · 0.88mm/px · 3 of 32 slices shown (4 of 4)]
[im 1/32]
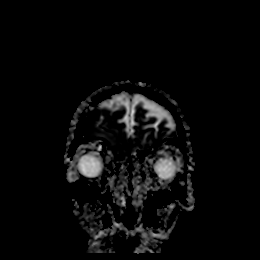
[im 16/32]
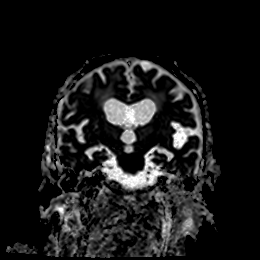
[im 32/32]
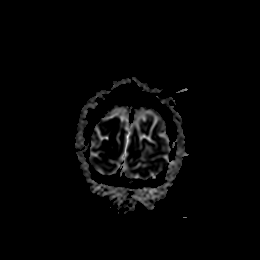

[Series 9: T1 · sagittal · 5.0mm · 0.75mm/px · 2 of 23 slices shown]
[im 1/23]
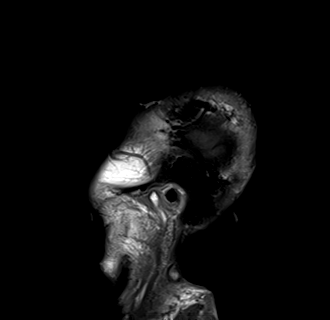
[im 23/23]
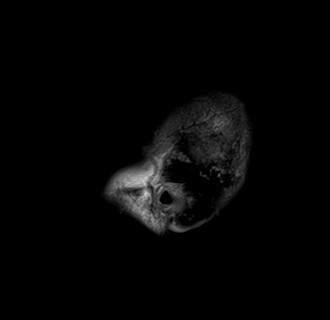

[Series 10: T2 · axial · 5.0mm · 0.72mm/px · z∈[-110,+31]mm · 2 of 24 slices shown (1 of 2)]
[im 1/24]
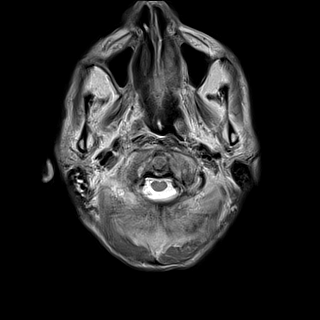
[im 24/24]
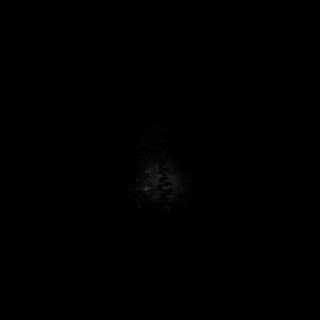

[Series 11: FLAIR · axial · 5.0mm · 0.45mm/px · z∈[-106,+34]mm · 2 of 25 slices shown]
[im 1/25]
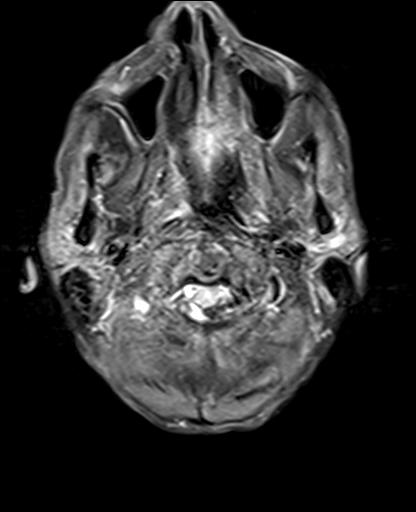
[im 25/25]
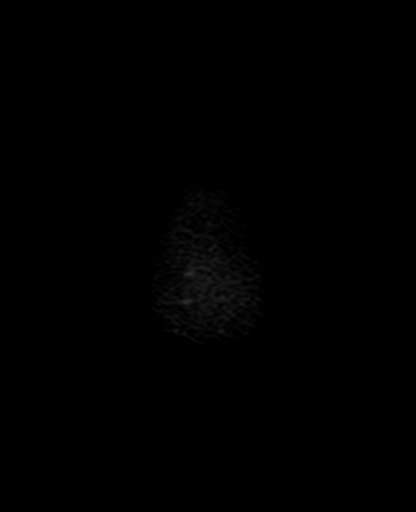

[Series 13: pha_images · axial · 3.0mm · 0.90mm/px · z∈[-122,+45]mm · 5 of 55 slices shown]
[im 1/55]
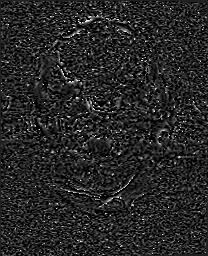
[im 14/55]
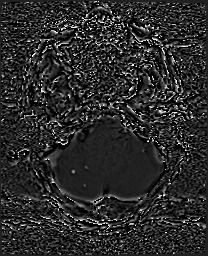
[im 28/55]
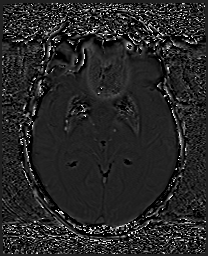
[im 41/55]
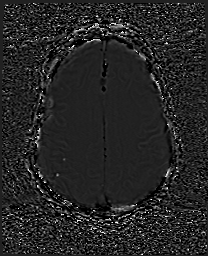
[im 55/55]
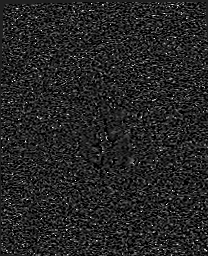

[Series 14: swi_images · axial · 3.0mm · 0.90mm/px · z∈[-122,-55]mm · 3 of 60 slices shown]
[im 1/60]
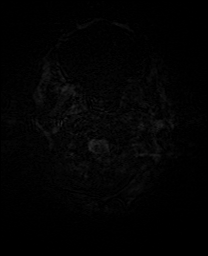
[im 12/60]
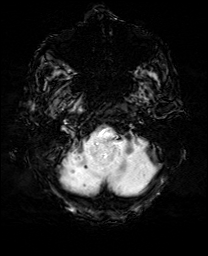
[im 24/60]
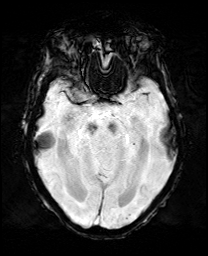

[Series 17: T2 · coronal · 5.0mm · 0.34mm/px · 3 of 29 slices shown (2 of 2)]
[im 1/29]
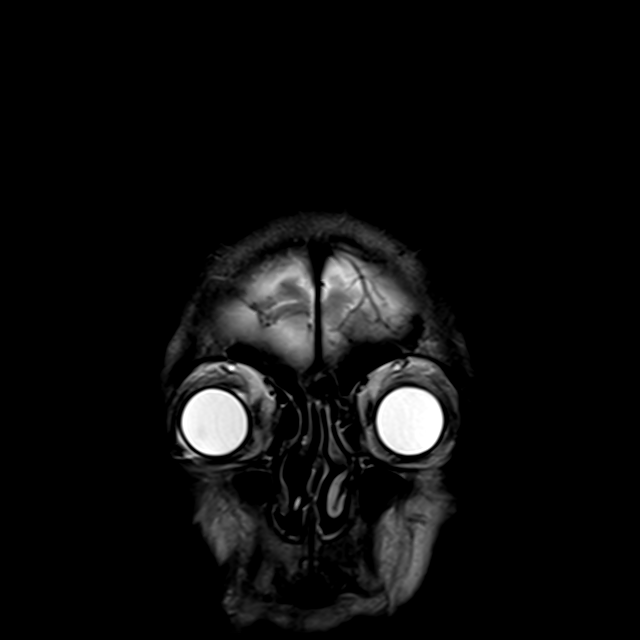
[im 15/29]
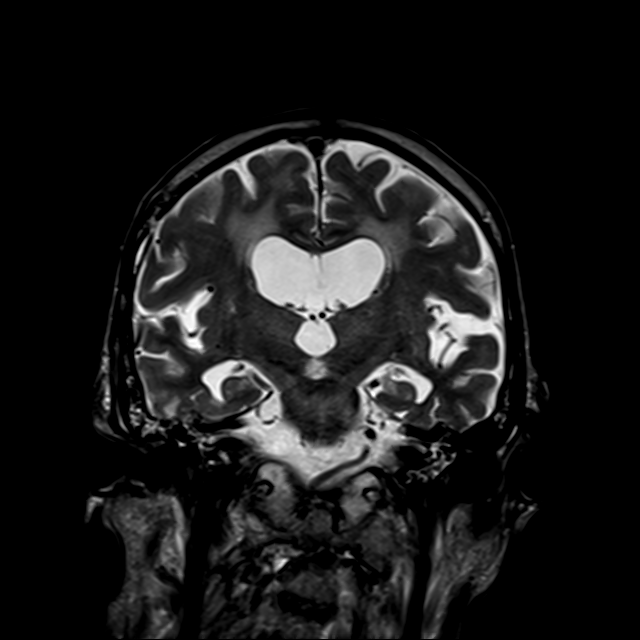
[im 29/29]
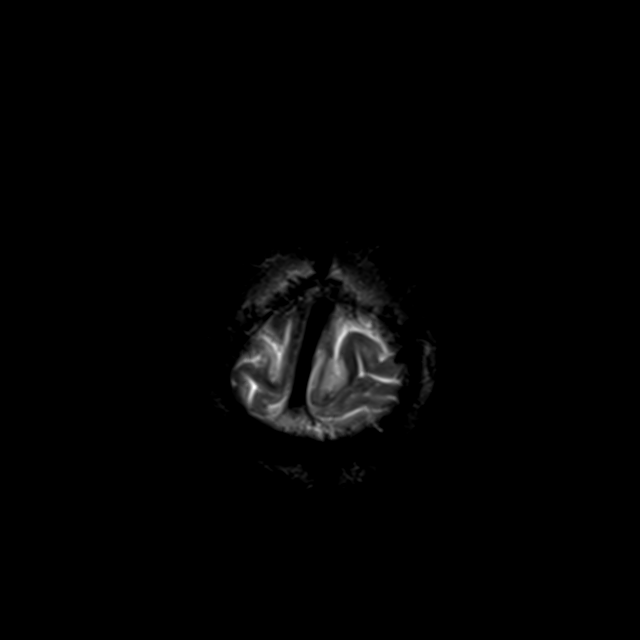

[39 of 48 positions shown; findings below may reference images not displayed]

FINDINGS: Brain: Right greater than left subfrontal hematomas have not
significantly changed since [DATE] (series 11, image 13). That
on the right measures 8-9 mm in thickness, on the left 3 mm. There
is also trace right subdural in middle cranial fossa, unchanged.
Stable mild regional mass effect.

But there is extensive new restricted diffusion indicating acute
infarcts widely scattered bilateral cerebellum (series 5, image 61)
involving nearly the entire left calf pons (series 5, image 65)
affecting both occipital poles (series 5, image 69) and posterior
cortex and subcortical white matter of the parietal lobes more so
the right (series 5, image 79). Deep gray nuclei are spared.
Anterior circulation is spared (DWI artifact related to subdural
hematoma).

Cytotoxic edema in the affected areas. No definite acute hemorrhage.
Scattered chronic microhemorrhages in the posterior fossa, deep gray
nuclei, parietal and occipital lobes are not significantly changed
allowing for SWI imaging today.

Basilar cisterns remain patent. Stable ventriculomegaly, probably ex
vacuo related. Stable Patchy and confluent cerebral white matter T2
and FLAIR hyperintensity. No midline shift, evidence of mass lesion.
Cervicomedullary junction and pituitary are within normal limits.

Vascular: Major intracranial vascular flow voids are preserved.

Skull and upper cervical spine: Stable pronounced degenerative
ligamentous hypertrophy about the odontoid. Visualized bone marrow
signal is within normal limits.

Sinuses/Orbits: Stable, negative.

Other: Intubated. Right nasoenteric tube in place. Bilateral mastoid
effusions have mildly increased.
IMPRESSION: 1. Extensive acute infarcts in the bilateral posterior circulation.
Subtotal involvement of the left pons. Scattered bilateral
cerebellar, occipital, and parietal lobe involvement. Cytotoxic
edema without acute hemorrhage. No significant mass effect at this
time.

2. Anterior circulation is spared. Pattern suggests a recent embolic
event to the posterior circulation.

3. Right > left mostly subfrontal SDH is stable since [DATE]. No
significant mass effect.

4. Underlying cerebral volume loss, chronic micro-hemorrhages and
chronic white matter disease.

ADDENDUM:
Study discussed by telephone with Dr. SVEN on
[DATE] at [D5] hours.

*** End of Addendum ***
FINDINGS: Brain: Right greater than left subfrontal hematomas have not
significantly changed since [DATE] (series 11, image 13). That
on the right measures 8-9 mm in thickness, on the left 3 mm. There
is also trace right subdural in middle cranial fossa, unchanged.
Stable mild regional mass effect.

But there is extensive new restricted diffusion indicating acute
infarcts widely scattered bilateral cerebellum (series 5, image 61)
involving nearly the entire left calf pons (series 5, image 65)
affecting both occipital poles (series 5, image 69) and posterior
cortex and subcortical white matter of the parietal lobes more so
the right (series 5, image 79). Deep gray nuclei are spared.
Anterior circulation is spared (DWI artifact related to subdural
hematoma).

Cytotoxic edema in the affected areas. No definite acute hemorrhage.
Scattered chronic microhemorrhages in the posterior fossa, deep gray
nuclei, parietal and occipital lobes are not significantly changed
allowing for SWI imaging today.

Basilar cisterns remain patent. Stable ventriculomegaly, probably ex
vacuo related. Stable Patchy and confluent cerebral white matter T2
and FLAIR hyperintensity. No midline shift, evidence of mass lesion.
Cervicomedullary junction and pituitary are within normal limits.

Vascular: Major intracranial vascular flow voids are preserved.

Skull and upper cervical spine: Stable pronounced degenerative
ligamentous hypertrophy about the odontoid. Visualized bone marrow
signal is within normal limits.

Sinuses/Orbits: Stable, negative.

Other: Intubated. Right nasoenteric tube in place. Bilateral mastoid
effusions have mildly increased.
IMPRESSION: 1. Extensive acute infarcts in the bilateral posterior circulation.
Subtotal involvement of the left pons. Scattered bilateral
cerebellar, occipital, and parietal lobe involvement. Cytotoxic
edema without acute hemorrhage. No significant mass effect at this
time.

2. Anterior circulation is spared. Pattern suggests a recent embolic
event to the posterior circulation.

3. Right > left mostly subfrontal SDH is stable since [DATE]. No
significant mass effect.

4. Underlying cerebral volume loss, chronic micro-hemorrhages and
chronic white matter disease.

## 2021-07-19 MED ORDER — ALTEPLASE 2 MG IJ SOLR
2.0000 mg | Freq: Once | INTRAMUSCULAR | Status: AC
  Administered 2021-07-19: 2 mg
  Filled 2021-07-19: qty 2

## 2021-07-19 MED ORDER — DEXTROSE-NACL 5-0.9 % IV SOLN: INTRAVENOUS | Status: DC

## 2021-07-19 MED ORDER — ALTEPLASE 2 MG IJ SOLR
2.0000 mg | Freq: Once | INTRAMUSCULAR | Status: AC
  Administered 2021-07-19: 2 mg
  Filled 2021-07-19 (×2): qty 2

## 2021-07-19 MED ORDER — MIDODRINE HCL 5 MG PO TABS
10.0000 mg | ORAL_TABLET | Freq: Three times a day (TID) | ORAL | Status: DC
  Administered 2021-07-19 – 2021-07-23 (×12): 10 mg
  Filled 2021-07-19 (×11): qty 2

## 2021-07-19 MED ORDER — MIDODRINE HCL 5 MG PO TABS: 10.0000 mg | ORAL_TABLET | Freq: Three times a day (TID) | ORAL | Status: DC

## 2021-07-19 NOTE — Progress Notes (Addendum)
NAME:  Gary Stevenson, MRN:  [id number], DOB:  July 04, 1940, LOS: 30 ADMISSION DATE:  07/13/2021, CONSULTATION DATE: 07/14/2021 REFERRING MD:  Marlyce Huge, CHIEF COMPLAINT:  AMS, Respiratory failure   History of Present Illness:  Gary Stevenson is a 81 y.o. M with PMH significant for right frontal subdural hematomas status post surgical evacuation in 2012, lung cancer status post lobectomy, alcohol abuse, CKD who presented to the ED 1/12 after a fall and was found to have R acetabular fx with interval displacement and underwent R total hip arthroplasty on 07/11/2021.  He was later found with decreased responsiveness and seizure activity that lasted for around 1 minute.  He was seen by Neurology and started on Phenobarbital and Keppra with LTM EEG.  Neurology felt seizure was most likely secondary to subdural hematoma and he was gradually improving until 1/17 when he was noted to have worsening mental status again with hypotension.  ABG 7.134 and pCO2 60.7, patient's mental status was not amenable for Bipap, so PCCM consulted for transfer.   Pertinent  Medical History   Past Medical History:  Diagnosis Date   Arthritis    Brain aneurysm    ETOH abuse    intoxicated   Lung cancer ([place])    Significant Hospital Events: Including procedures, antibiotic start and stop dates in addition to other pertinent events   1/11 admission for R hip fx 1/12 R hip arthoplasty 1/13 Sz activity 1/17 worsening mental status, PCCM consult and ICU txfr, intubated  1/18 CT head with stable small bilateral subdural hematomas but no acute intracranial abnormalities. 1/18 EEG >> abnormal comatose EEG, no epileptiform discharges 1/18 MRI brain >> stable bilateral frontal subacute subdural hematoma.  No new intracranial abnormalities. 1/19 CXR>> stable multilobar pneumonia, resolved RUL collapse, continued LLL consolidation/collapse 1/19 cEEG >>BIPDs with some brief electrographic seizures during the initial portion of the  recording, indicative of a multifocal epileptogenic disturbance. Seizures were improved in the afternoon and evening 1/21 worsening shock, acidosis.  Antibiotics changed to Vanco and meropenem.  Made DNR after family meeting  Interim History / Subjective:   Antibiotics changed to Vanco, meropenem yesterday  Objective   Blood pressure (!) 99/58, pulse 64, temperature (!) 97.4 F (36.3 C), temperature source Oral, resp. rate (!) 28, height 5\' 5"  (1.651 m), weight 48.3 kg, SpO2 100 %.    Vent Mode: PRVC FiO2 (%):  [50 %-75 %] 50 % Set Rate:  [28 bmp] 28 bmp Vt Set:  [490 mL] 490 mL PEEP:  [5 cmH20] 5 cmH20 Plateau Pressure:  [19 cmH20-25 cmH20] 22 cmH20   Intake/Output Summary (Last 24 hours) at 07/19/2021 0734 Last data filed at 07/19/2021 0600 Gross per 24 hour  Intake 2488.87 ml  Output 950 ml  Net 1538.87 ml   Filed Weights   07/15/21 0343 07/16/21 0343 07/18/21 0616  Weight: 51.9 kg 53.1 kg 48.3 kg    Examination: Blood pressure (!) 107/46, pulse 69, temperature (!) 97.4 F (36.3 C), temperature source Oral, resp. rate (!) 28, height 5\' 5"  (1.651 m), weight 48.3 kg, SpO2 100 %. Gen:      No acute distress HEENT:  EOMI, sclera anicteric Neck:     No masses; no thyromegaly Lungs:    Clear to auscultation bilaterally; normal respiratory effort CV:         Regular rate and rhythm; no murmurs Abd:      + bowel sounds; soft, non-tender; no palpable masses, no distension Ext:    1+ edema; adequate  peripheral perfusion Skin:      Warm and dry; no rash Neuro: alert and oriented x 3 Psych: normal mood and affect   Labs/imaging reviewed Significant for creatinine with slight improvement to 2.14 Hemoglobin down to 7.7 Sputum cultures with Enterobacter, Klebsiella   Resolved Hospital Problem list   Hypernatremia Respiratory acidosis  Assessment & Plan:  Acute metabolic encephalopathy Chronic subdural hematoma History of seizure Continues to have poor mental status off  sedation MRI with extensive embolic strokes. Appreciate neurology recommendation  Acute hypoxic hypercarbic respiratory failure Klebsiella, Enterobacter pneumonia Antibiotics broadened to Vanco and meropenem on 1/21 Follow cultures  Septic shock Weaning off pressors  AKI on CKD stage IIIa Follow urine output and creatinine Check lactic acid Not a good candidate for dialysis.  See goals of care discussion below  Ileus Tube feeds on hold.  Continue NG tube to suction  Close displaced fracture of the right femoral neck Status post right hip total arthroplasty on 1/12 by orthopedic surgery.  Surgical site with mild edema covered with dressing. C/d/l Continue pain control  Severe protein caloric malnutrition Failure to thrive Hypoglycemia Tube feeds on hold due to ileus Start D5  Goals of care Discussed with [name] daughter at bedside on 1/22.  I reviewed clinical course over the past 24 hours with concern for worsening multiorgan failure with new septic shock, anuric renal failure.  We discussed that prognosis for recovery is extremely poor.  [name] has agreed to DNR. She also agreed that he is not a candidate for dialysis and will not escalate I have recommended her to speak with family and transition to comfort measures and she is considering this.  Given new MRI findings we will need to revisit goals of care conversation today  Best Practice (right click and "Reselect all SmartList Selections" daily)   Diet/type: NPO DVT prophylaxis: not indicated.  On hold due to recent subdural hemorrhage GI prophylaxis: PPI Lines: Central line and Arterial Line Foley:  N/A Code Status:  full code Last date of multidisciplinary goals of care discussion [1/21, spoke to daughter at bedside]  Critical care time:    The patient is critically ill with multiple organ system failure and requires high complexity decision making for assessment and support, frequent evaluation and titration  of therapies, advanced monitoring, review of radiographic studies and interpretation of complex data.   Critical Care Time devoted to patient care services, exclusive of separately billable procedures, described in this note is 55 minutes.   [name] MD [facility] See Amion for pagera  If no response to pager , please call [phone] until 7pm After 7:00 pm call Elink  [phone] 07/19/2021, 7:55 AM

## 2021-07-19 NOTE — Progress Notes (Signed)
°  Interdisciplinary Goals of Care Family Meeting   Date carried out:: 07/19/2021  Location of the meeting: Bedside  Member's involved: Dr. Vaughan Browner CCM, Dr. Sarita Haver, Neurology and family including wife and daughter   Durable Power of Attorney or acting medical decision maker: Wife and daughter  Discussion: We discussed goals of care for Gary Stevenson .  Discussed his neurologic status with MRI findings of strokes, no reflexes.  Remains in multiorgan failure with septic shock.  Overall prognosis for recovery is poor  Family has reiterated that they agree to a DNR, no dialysis or escalation of care.  However continue all other support for now.  They want to wait for 10 days before making a decision on withdrawal.  Palliative care consult has been placed   Code status: Limited Code or DNR with short term, Mechanical ventilation, Cental IV access, and IV vasoactive drugs  Disposition: Continue current acute care  Time spent for the meeting: 15 mins  [name] MD [facility] 07/19/2021, 4:15 PM

## 2021-07-19 NOTE — Progress Notes (Signed)
At 0230 PT transported from room 91m02 to MRI on Vent. Back in room at 0337. Pt on vent. No complication noted. Will continue to monitor.

## 2021-07-19 NOTE — Progress Notes (Signed)
Neurology Progress Note  Brief HPI:  Patient had worsening shock yesterday and pressors were added. Patient continues to be intubated. MRI brain was performed yesterday which showed extensive acute embolic strokes in posterior circulation.  S: Patient is unable to participate in ROS due to unresponsiveness.   O: Current vital signs: BP (!) 106/59    Pulse 86    Temp (!) 97.3 F (36.3 C) (Axillary)    Resp (!) 25    Ht _0  (1.651 m)    Wt 48.3 kg    SpO2 98%    BMI 17.72 kg/m  Vital signs in last 24 hours: Temp:  [94.5 F (34.7 C)-97.4 F (36.3 C)] 97.3 F (36.3 C) (01/22 1100) Pulse Rate:  [60-86] 86 (01/22 1145) Resp:  [16-29] 25 (01/22 1145) BP: (86-132)/(46-75) 106/59 (01/22 1130) SpO2:  [97 %-100 %] 98 % (01/22 1145) Arterial Line BP: (85-155)/(37-70) 95/41 (01/22 1115) FiO2 (%):  [40 %-60 %] 40 % (01/22 1100)  GENERAL: Toxic appearing. Unresponsive. Resting comfortably.  HEENT: Normocephalic and atraumatic. LUNGS: Normal respiratory effort.  CV: RRR on tele.  Ext: warm.  NEURO: Has been off sedation for over 24+ hours. Mental Status:Unresponsive. GCS 3.  Speech/Language: Mute speech.   Cranial Nerves:  Patient does not respond to name calling, loud clapping, or noxious stimuli. No response to brow pressure. Eyes closed. Pupils 11m and equal round and sluggish reaction. ETT in place. Weak corneal reflexes BL, weak cough but no gag. Does not move extremities even to pain.  Gait- deferred. Bedrest.  Medications  Current Facility-Administered Medications:    0.9 %  sodium chloride infusion, 250 mL, Intravenous, Continuous, MMaryjane Hurter MD, Last Rate: 10 mL/hr at 07/19/21 1000, Infusion Verify at 07/19/21 1000   acetaminophen (TYLENOL) tablet 325-650 mg, 325-650 mg, Per Tube, Q6H PRN, URalph Dowdy RPH, 650 mg at 07/16/21 2321   alum & mag hydroxide-simeth (MAALOX/MYLANTA) 200-200-20 MG/5ML suspension 30 mL, 30 mL, Per Tube, Q4H PRN, URalph Dowdy RPH, 30 mL at  07/19/21 09735  chlorhexidine gluconate (MEDLINE KIT) (PERIDEX) 0.12 % solution 15 mL, 15 mL, Mouth Rinse, BID, MMaryjane Hurter MD, 15 mL at 07/19/21 0859   Chlorhexidine Gluconate Cloth 2 % PADS 6 each, 6 each, Topical, Daily, Swinteck, BAaron Edelman MD, 6 each at 07/19/21 0431   dextrose 5 %-0.9 % sodium chloride infusion, , Intravenous, Continuous, Mannam, Praveen, MD, Last Rate: 50 mL/hr at 07/19/21 0934, New Bag at 07/19/21 0934   docusate (COLACE) 50 MG/5ML liquid 100 mg, 100 mg, Per Tube, BID, MMaryjane Hurter MD, 100 mg at 07/19/21 03299  feeding supplement (VITAL AF 1.2 CAL) liquid 1,000 mL, 1,000 mL, Per Tube, Continuous, DSpero Geralds MD, Last Rate: 20 mL/hr at 07/17/21 1630, Infusion Verify at 07/17/21 1630   fentaNYL (SUBLIMAZE) injection 50-200 mcg, 50-200 mcg, Intravenous, Q30 min PRN, MMaryjane Hurter MD, 25 mcg at 024/26/8304196  folic acid injection 1 mg, 1 mg, Intravenous, Daily, Elgergawy, DSilver Huguenin MD, 1 mg at 07/19/21 0945   levETIRAcetam (KEPPRA) IVPB 500 mg/100 mL premix, 500 mg, Intravenous, Q12H, KDonnetta Simpers MD, Stopped at 07/19/21 0234   lip balm (CARMEX) ointment, , Topical, PRN, Elgergawy, DSilver Huguenin MD, 75 application at 022/29/792006   MEDLINE mouth rinse, 15 mL, Mouth Rinse, 10 times per day, MMaryjane Hurter MD, 15 mL at 07/19/21 0933   menthol-cetylpyridinium (CEPACOL) lozenge 3 mg, 1 lozenge, Oral, PRN **OR** phenol (CHLORASEPTIC) mouth spray 1 spray,  1 spray, Mouth/Throat, PRN, Swinteck, Aaron Edelman, MD   meropenem (MERREM) 1 g in sodium chloride 0.9 % 100 mL IVPB, 1 g, Intravenous, Q12H, Mannam, Praveen, MD, Stopped at 07/19/21 0955   midodrine (PROAMATINE) tablet 10 mg, 10 mg, Per Tube, Q8H, Mannam, Praveen, MD, 10 mg at 07/19/21 4081   multivitamin with minerals tablet 1 tablet, 1 tablet, Per Tube, Daily, Ralph Dowdy, RPH, 1 tablet at 07/19/21 4481   norepinephrine (LEVOPHED) 16 mg in 22m premix infusion, 0-40 mcg/min, Intravenous, Titrated,  Mannam, Praveen, MD, Last Rate: 5.16 mL/hr at 07/19/21 1000, 5.5 mcg/min at 07/19/21 1000   ondansetron (ZOFRAN) injection 4 mg, 4 mg, Intravenous, Q6H PRN, ALacinda Axon MD, 4 mg at 07/17/21 1144   pantoprazole sodium (PROTONIX) 40 mg/20 mL oral suspension 40 mg, 40 mg, Per Tube, Daily, ALacinda Axon MD, 40 mg at 07/19/21 0954   PHENobarbital (LUMINAL) tablet 64.8 mg, 64.8 mg, Per Tube, BID, Elgergawy, DSilver Huguenin MD, 64.8 mg at 07/19/21 0920   polyethylene glycol (MIRALAX / GLYCOLAX) packet 17 g, 17 g, Per Tube, Daily, MMaryjane Hurter MD, 17 g at 07/18/21 0925   polyethylene glycol (MIRALAX / GLYCOLAX) packet 17 g, 17 g, Per Tube, Daily PRN, Ueland, Emma M, RPH   promethazine (PHENERGAN) 12.5 mg in sodium chloride 0.9 % 50 mL IVPB, 12.5 mg, Intravenous, Q6H PRN, Ogan, OKerry Kass MD   senna (SENOKOT) tablet 8.6 mg, 1 tablet, Per Tube, BID, URalph Dowdy RPH, 8.6 mg at 07/18/21 2208   sodium chloride flush (NS) 0.9 % injection 10-40 mL, 10-40 mL, Intracatheter, Q12H, MMaryjane Hurter MD, 40 mL at 07/19/21 0932   sodium chloride flush (NS) 0.9 % injection 10-40 mL, 10-40 mL, Intracatheter, PRN, MMaryjane Hurter MD   thiamine tablet 100 mg, 100 mg, Per Tube, Daily, 100 mg at 07/17/21 0928 **OR** thiamine (B-1) injection 100 mg, 100 mg, Intravenous, Daily, MMaryjane Hurter MD, 100 mg at 07/19/21 0930   vancomycin variable dose per unstable renal function (pharmacist dosing), , Does not apply, See admin instructions, Mannam, Praveen, MD   vasopressin (PITRESSIN) 20 Units in sodium chloride 0.9 % 100 mL infusion-*FOR SHOCK*, 0-0.03 Units/min, Intravenous, Continuous, Ogan, OKerry Kass MD, Stopped at 07/18/21 1026  Imaging MD has reviewed images in epic and the results pertinent to this consultation are:  MRI Brain 07/19/21.  1. Extensive acute infarcts in the bilateral posterior circulation. Subtotal involvement of the left pons. Scattered bilateral cerebellar, occipital,  and parietal lobe involvement. Cytotoxic edema without acute hemorrhage. No significant mass effect at this time.  2. Anterior circulation is spared. Pattern suggests a recent embolic event to the posterior circulation.  3. Right > left mostly subfrontal SDH is stable since 07/15/2021. No significant mass effect.  4. Underlying cerebral volume loss, chronic micro-hemorrhages and chronic white matter disease.  Assessment: 81yo male s/p fall with ORIF right femur and acetabulum. The next day, he was found responsive only to pain and had a seizure. EEG was positive for sharp waves on 07/10/21 and he was started on AEDs. His mental status improved. We signed off but were called back on 07/15/21 due to patient becoming unresponsive again. He was intubated. Later, he experienced hypotension and hypoxia thought to be the reason for change in his mental status. His Keppra was increased due to BIPDs and multifocal epileptogenic disturbance. His neurological exam on 07/17/21 had worsened. A MRI brain was done which showed acute infarcts. He has continued to decline  to the point of no brain stem reflexes and is back in septic shock. PCCM made patient a DNR and are talking to family again today about progressing to comfort care.   Recommendations/Plan:  -Agree with comfort care.  -Unfortunately his neurology exam continues to worsen.  -Neurology will sign off but will be available for questions or concerns.   Pt seen by Clance Boll, MSN, APN-BC/Nurse Practitioner/Neuro and later by MD. Note and plan to be edited as needed by MD.  Pager: [phone]  NEUROHOSPITALIST ADDENDUM Performed a face to face diagnostic evaluation.   I have reviewed the contents of history and physical exam as documented by PA/ARNP/Resident and agree with above documentation.  I have discussed and formulated the above plan as documented. Edits to the note have been made as needed.  Impression/Key exam findings/Plan: 81 y/o M  with acute unresponsiveness in the setting of respiratory failure with concern for anoxic injury. cEEG with seizures which resolved with Keppra 53m BID but did have epileptiform discharges. Continued home phenobarb. Exam today off sedation for more than 24 hours with BL pupils 242m equal round with sluggish reaction to very bright light, corneal is barely positive, weak cough, absent gag, dolls eyes with eyes moving with the head. No response to nares stimulation or to noxious stimuli in any extremities with flaccid tone in all extremities. No attempts to communicate or comprehend language, does not localize, no spontaneous movements, no evidence of awareness.   I discussed with family that althou he is not brain dead, he probably does not have sufficiently preserved brainstem function to permit survival without continued care. He will likely need tracheostomy and be ventilator dependent for breathing and will likely need a PEG tube for feeding. He will require round the clock care and will be high risk for Ventilator associated Pneumonia, bedsores. In the long run, he is either going to be in a persistent vegetative state or may progress to minimally conscious state. He is not going to be back to how he was.  Family opted for continued care at this time.  SaDonnetta SimpersMD [facility] [phone] If 7pm to 7am, please call on call as listed on AMION.

## 2021-07-20 ENCOUNTER — Inpatient Hospital Stay (HOSPITAL_COMMUNITY): Payer: Medicare HMO

## 2021-07-20 DIAGNOSIS — I639 Cerebral infarction, unspecified: Secondary | ICD-10-CM

## 2021-07-20 DIAGNOSIS — Z515 Encounter for palliative care: Secondary | ICD-10-CM

## 2021-07-20 DIAGNOSIS — I6340 Cerebral infarction due to embolism of unspecified cerebral artery: Secondary | ICD-10-CM | POA: Insufficient documentation

## 2021-07-20 DIAGNOSIS — R0603 Acute respiratory distress: Secondary | ICD-10-CM

## 2021-07-20 DIAGNOSIS — S72001A Fracture of unspecified part of neck of right femur, initial encounter for closed fracture: Secondary | ICD-10-CM | POA: Diagnosis not present

## 2021-07-20 LAB — CBC
HCT: 22.6 % — ABNORMAL LOW (ref 39.0–52.0)
Hemoglobin: 8 g/dL — ABNORMAL LOW (ref 13.0–17.0)
MCH: 30.2 pg (ref 26.0–34.0)
MCHC: 35.4 g/dL (ref 30.0–36.0)
MCV: 85.3 fL (ref 80.0–100.0)
Platelets: 233 10*3/uL (ref 150–400)
RBC: 2.65 MIL/uL — ABNORMAL LOW (ref 4.22–5.81)
RDW: 17.8 % — ABNORMAL HIGH (ref 11.5–15.5)
WBC: 9.9 10*3/uL (ref 4.0–10.5)
nRBC: 1.5 % — ABNORMAL HIGH (ref 0.0–0.2)

## 2021-07-20 LAB — CULTURE, BLOOD (ROUTINE X 2)
Culture: NO GROWTH
Culture: NO GROWTH

## 2021-07-20 LAB — GLUCOSE, CAPILLARY
Glucose-Capillary: 100 mg/dL — ABNORMAL HIGH (ref 70–99)
Glucose-Capillary: 105 mg/dL — ABNORMAL HIGH (ref 70–99)
Glucose-Capillary: 111 mg/dL — ABNORMAL HIGH (ref 70–99)
Glucose-Capillary: 84 mg/dL (ref 70–99)
Glucose-Capillary: 89 mg/dL (ref 70–99)
Glucose-Capillary: 97 mg/dL (ref 70–99)

## 2021-07-20 LAB — BASIC METABOLIC PANEL
Anion gap: 12 (ref 5–15)
BUN: 59 mg/dL — ABNORMAL HIGH (ref 8–23)
CO2: 13 mmol/L — ABNORMAL LOW (ref 22–32)
Calcium: 7.6 mg/dL — ABNORMAL LOW (ref 8.9–10.3)
Chloride: 112 mmol/L — ABNORMAL HIGH (ref 98–111)
Creatinine, Ser: 2.42 mg/dL — ABNORMAL HIGH (ref 0.61–1.24)
GFR, Estimated: 26 mL/min — ABNORMAL LOW (ref >60)
Glucose, Bld: 100 mg/dL — ABNORMAL HIGH (ref 70–99)
Potassium: 4 mmol/L (ref 3.5–5.1)
Sodium: 137 mmol/L (ref 135–145)

## 2021-07-20 LAB — MAGNESIUM: Magnesium: 2.1 mg/dL (ref 1.7–2.4)

## 2021-07-20 LAB — PHOSPHORUS: Phosphorus: 3.9 mg/dL (ref 2.5–4.6)

## 2021-07-20 LAB — VANCOMYCIN, RANDOM: Vancomycin Rm: 9

## 2021-07-20 IMAGING — DX DG CHEST 1V PORT
1 series · 1 of 1 positions shown · non-contrast
Comparison: Chest x-ray [DATE].

CLINICAL DATA: 80-year-old male with history of acute respiratory
failure.

EXAM:
PORTABLE CHEST 1 VIEW

[chest]
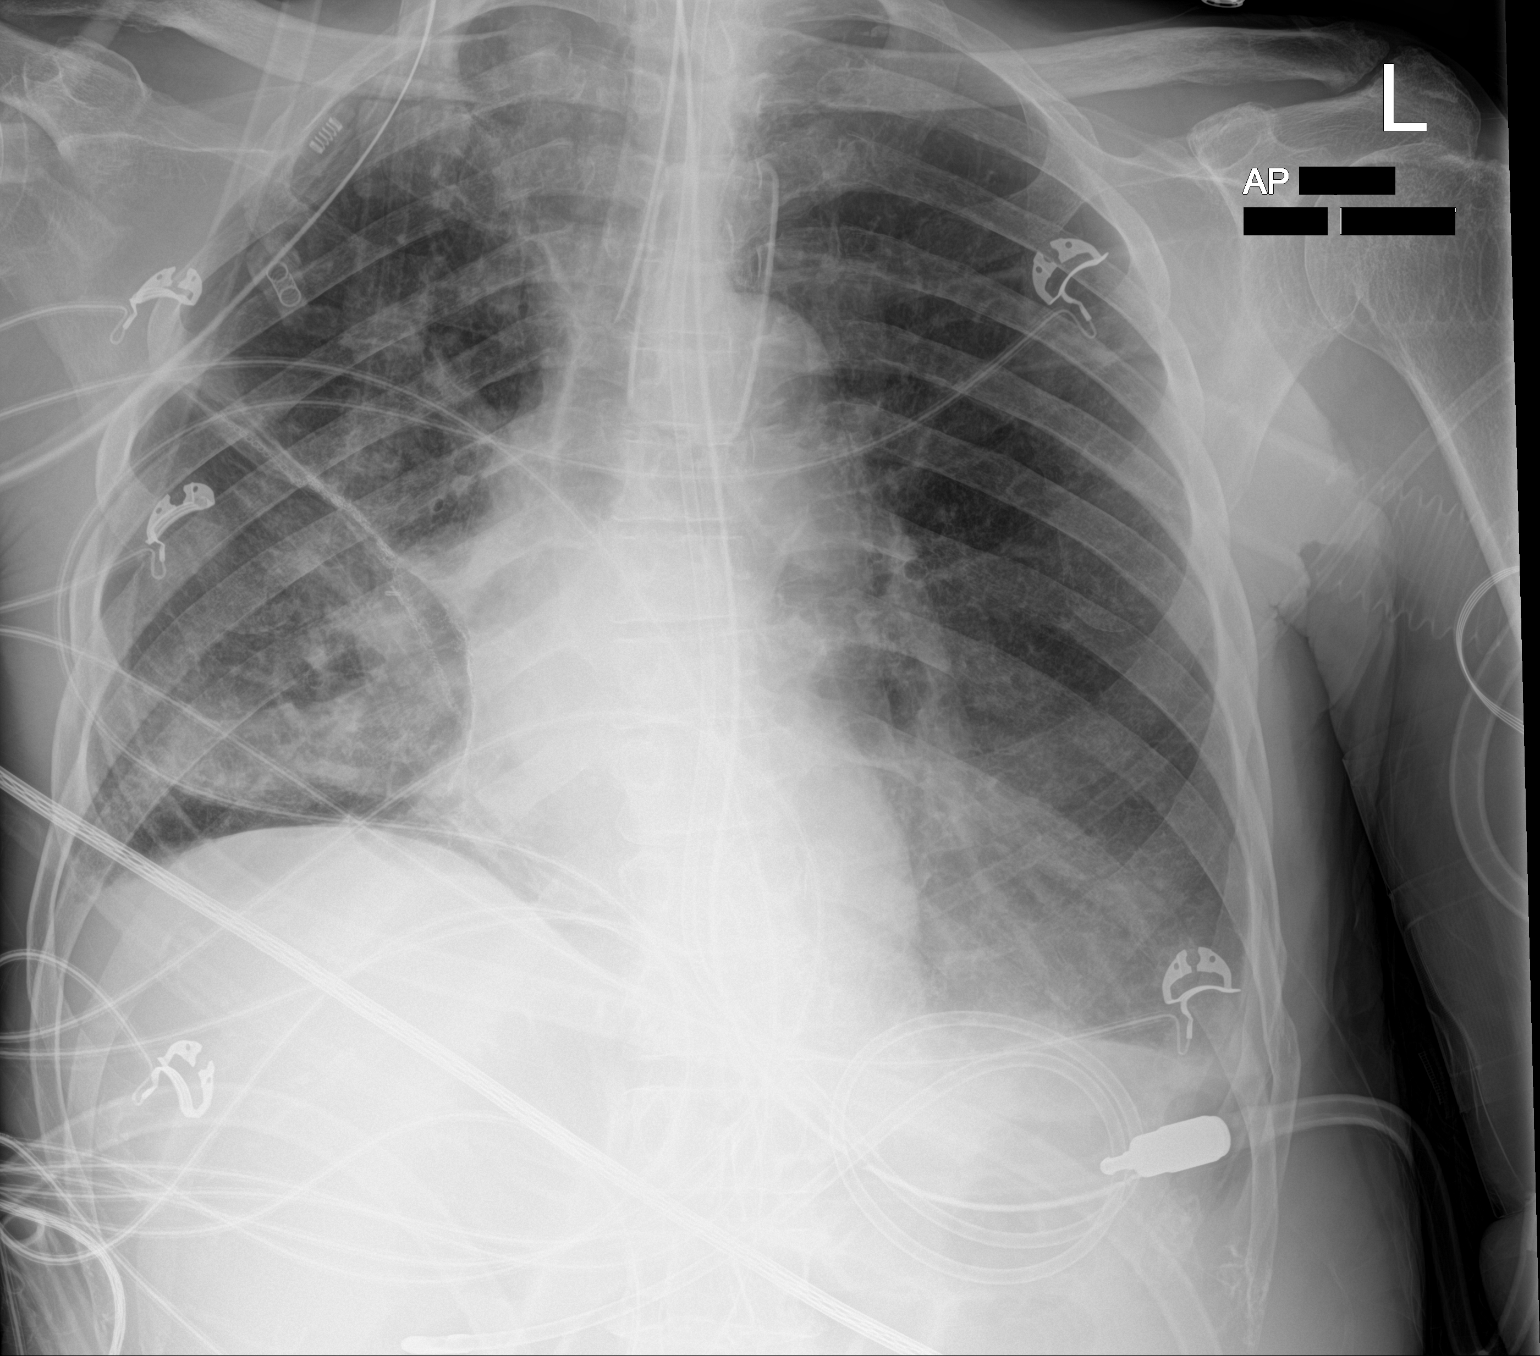

[1 of 1 positions shown; findings below may reference images not displayed]

FINDINGS: An endotracheal tube is in place with tip 3.8 cm above the carina. A
nasogastric tube is seen extending into the stomach. Patchy
multifocal airspace consolidation is noted throughout the right mid
to lower lung and at the left lung base. Small bilateral pleural
effusions. No pneumothorax. No evidence of pulmonary edema. Heart
size is normal. The patient is rotated to the right on today's exam,
resulting in distortion of the mediastinal contours and reduced
diagnostic sensitivity and specificity for mediastinal pathology.
IMPRESSION: 1. Support apparatus, as above.
2. Multilobar bilateral pneumonia (right greater than left) with
small bilateral pleural effusions.

## 2021-07-20 IMAGING — DX DG ABDOMEN 1V
1 series · 1 of 1 positions shown · non-contrast
Comparison: [DATE].

CLINICAL DATA: Ileus.

EXAM:
ABDOMEN - 1 VIEW

[abdomen]
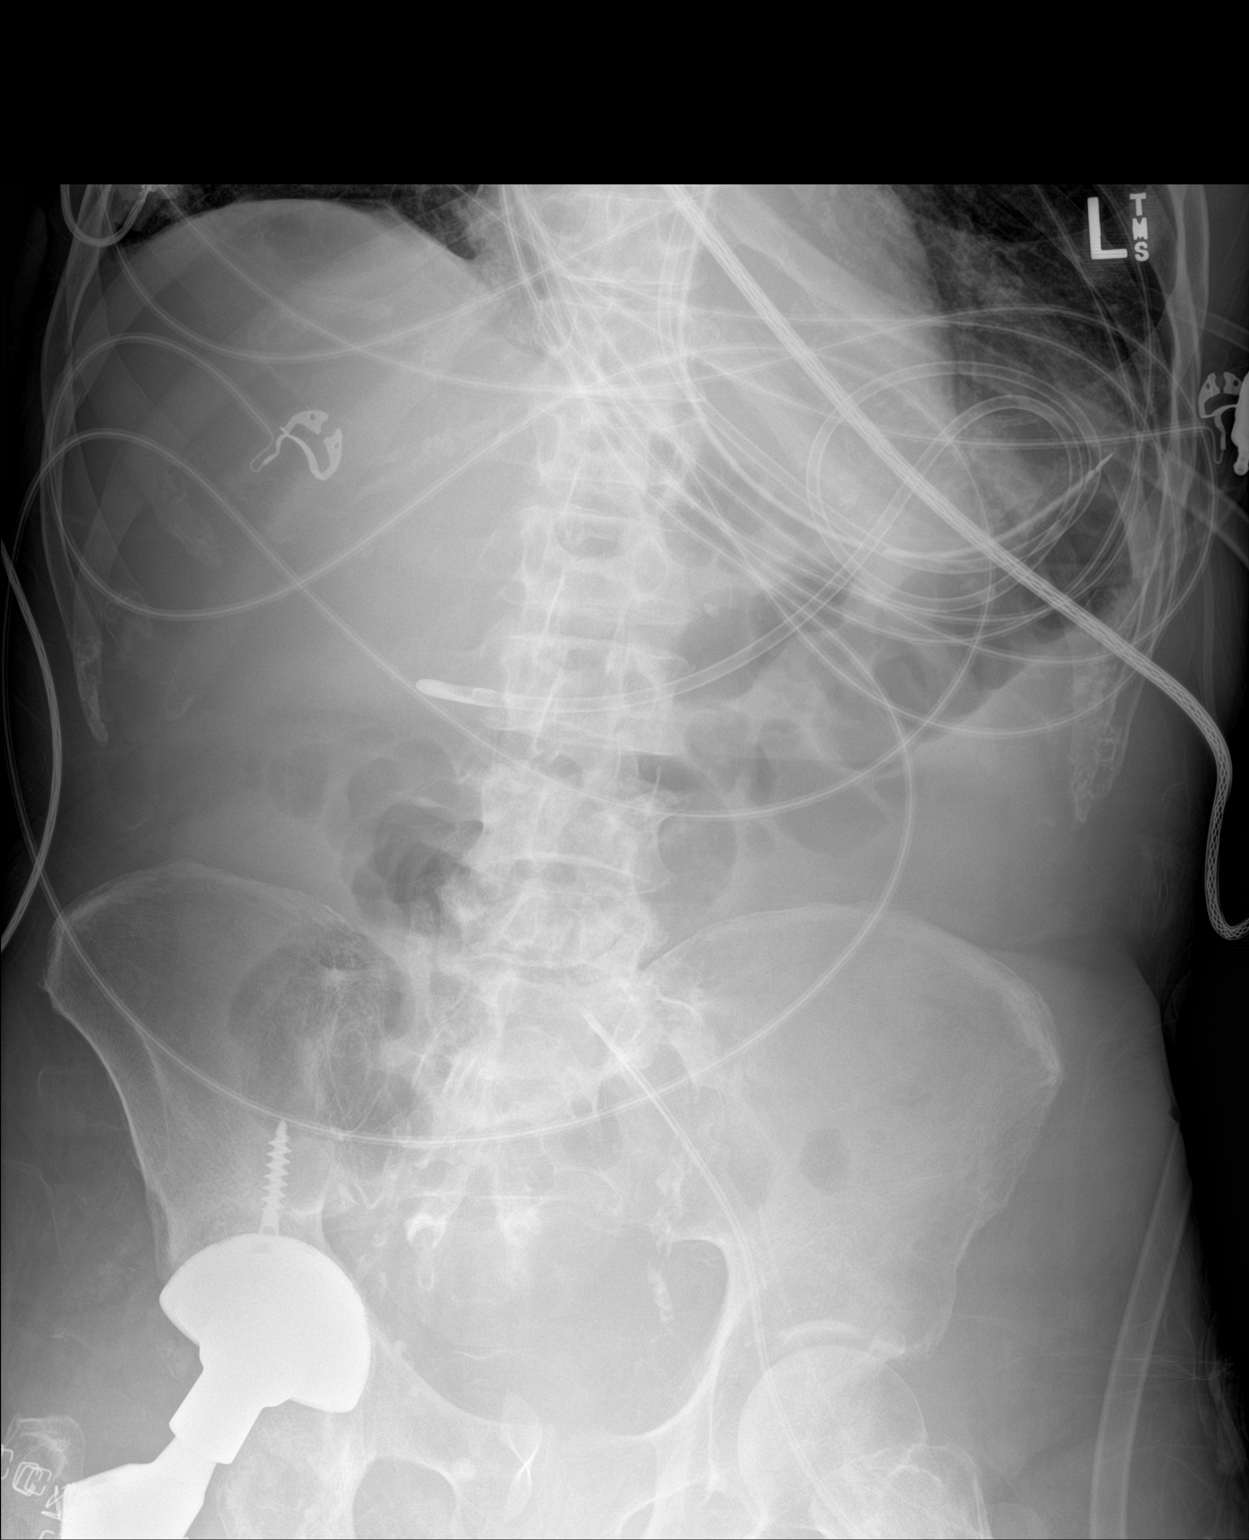

[1 of 1 positions shown; findings below may reference images not displayed]

FINDINGS: Feeding tube is coiled in the stomach with the tip in the pyloric
region. Nasogastric tube terminates in the stomach. Left-sided
femoral central line terminates near the iliac bifurcation.

Gas is seen in nondistended colon. Small bowel dilatation seen on
[DATE] has nearly completely resolved.

Bibasilar airspace opacification in the visualized lungs, similar.
IMPRESSION: 1. Resolving ileus or partial small bowel obstruction.
2. Bibasilar airspace opacification, better evaluated on chest
radiograph done the same day.

## 2021-07-20 MED ORDER — FOLIC ACID 1 MG PO TABS
1.0000 mg | ORAL_TABLET | Freq: Every day | ORAL | Status: DC
  Administered 2021-07-20 – 2021-07-27 (×8): 1 mg
  Filled 2021-07-20 (×8): qty 1

## 2021-07-20 MED ORDER — METOCLOPRAMIDE HCL 5 MG/ML IJ SOLN: 10.0000 mg | INTRAMUSCULAR | Status: DC | PRN

## 2021-07-20 MED ORDER — LORAZEPAM 2 MG/ML IJ SOLN: 2.0000 mg | INTRAMUSCULAR | Status: AC

## 2021-07-20 MED ORDER — BISACODYL 10 MG RE SUPP: 10.0000 mg | Freq: Every day | RECTAL | Status: DC

## 2021-07-20 MED ORDER — SODIUM BICARBONATE 8.4 % IV SOLN
INTRAVENOUS | Status: DC
  Filled 2021-07-20 (×7): qty 1000

## 2021-07-20 NOTE — Progress Notes (Signed)
Met with daughter at bedside. She seems to want trach/PEG SNF if offered. Will have palliative talk to her so that she understands that this may not be the best path forward for Rylynn.  She is agreeable.  Erskine Emery MD PCCM

## 2021-07-20 NOTE — Consult Note (Signed)
Consultation Note Date: 07/20/2021   Patient Name: Gary Stevenson  DOB: November 30, 1940  MRN: [id number]  Age / Sex: 81 y.o., male  PCP: Seward Carol, MD Referring Physician: Candee Furbish, MD  Reason for Consultation: Establishing goals of care and Psychosocial/spiritual support  HPI/Patient Profile: 81 y.o. male   admitted on 07/16/2021 after a fall and was found to have a right acetabular fracture with interval displacement and underwent right hip arthroplasty on 07/21/2021. PMH significant for chronic subdurals, lung Ca, ETOH abuse, CKD.     Neurology was consulted postoperatively for concern of seizure.   He was later found with decreased responsiveness and seizure activity that lasted for around 1 minute.  He was seen by Neurology and started on Phenobarbital and Keppra with LTM EEG.  Neurology felt seizure was most likely secondary to subdural hematoma and he was gradually improving until 1/17 when he was noted to have worsening mental status again with hypotension.  Patient was transferred to the ICU, intubated. Today is day 11 of his hospitalization.  He remains intubated off all sedation and is minimally responsive and unable to follow any commands.Marland Kitchen  MRI of the brain significant for acute infarcts, with worsening neurology exam.  MRI Brain 07/19/21.  1. Extensive acute infarcts in the bilateral posterior circulation. Subtotal involvement of the left pons. Scattered bilateral cerebellar, occipital, and parietal lobe involvement. Cytotoxic edema without acute hemorrhage. No significant mass effect at this time.  2. Anterior circulation is spared. Pattern suggests a recent embolic event to the posterior circulation.  3. Right > left mostly subfrontal SDH is stable since 07/15/2021. No significant mass effect.  4. Underlying cerebral volume loss, chronic micro-hemorrhages and chronic white matter  disease.   Recommendation is for comfort care.  According to patient's daughter Gary Stevenson she tells me that patient lives alone but that she brings meals to him on a daily basis.  She reports that he does have significant alcohol use currently,  and that she believes this attributes to his frequent falls in the home and overall state of un - wellness.   Family face treatment option decisions, advanced directive decisions and anticipatory care needs.   Clinical Assessment and Goals of Care:  This NP Wadie Lessen reviewed medical records, received report from team, assessed the patient and then meet at the patient's bedside along with his  step -daughter/Gary Stevenson (daughter tells me he is the only father that she is known and he cared for her since birth)  To discuss diagnosis, prognosis, GOC, EOL wishes disposition and options.   Concept of Palliative Care was introduced as specialized medical care for people and their families living with serious illness.  If focuses on providing relief from the symptoms and stress of a serious illness.  The goal is to improve quality of life for both the patient and the family.  Values and goals of care important to patient and family were attempted to be elicited.    Created space and opportunity for patient  and family to  explore thoughts and feelings regarding current medical situation.  Daughter verbalizes an understanding of the seriousness of her father's current medical situation however she bases her trust and hope in her deep sense of faith, and that ultimately "God has the last say so."  She verbalizes that her father has stated to her in the past that he wants everything done "until the end of his survival".  Education offered on the concept of the limitations of medical interventions to prolong life when the body fails to thrive.  Education offered and explored  on concepts of human mortality and medical futility.     A  discussion was had today  regarding advanced directives.  Concepts specific to code status, artifical feeding and hydration, continued IV antibiotics and rehospitalization was had.  The difference between a aggressive medical intervention path  and a palliative comfort care path for this patient at this time was had. Hard Choices booklet left for review   Natural trajectory and expectations at EOL were discussed.  Questions and concerns addressed.  Patient  encouraged to call with questions or concerns.     PMT will continue to support holistically.          No documented healthcare power of attorney or advanced care planning documents.  Gary Stevenson present today tells me that she does have documents, stating that she is HPOA, and that she will bring them to the hospital tomorrow for scanning. Gary Stevenson states that there are other known children to patient.       SUMMARY OF RECOMMENDATIONS    Code Status/Advance Care Planning: DNR   Palliative Prophylaxis:  Aspiration, Delirium Protocol, Frequent Pain Assessment, and Oral Care  Additional Recommendations (Limitations, Scope, Preferences): Full Scope Treatment  Psycho-social/Spiritual:  Desire for further Chaplaincy support:no-declines hospital spiritual care support.  She states she has her own minister and bishop and that "adding somewhat into the mix will only complicate things for her."   Prognosis:  Long-term poor prognosis  Discharge Planning: To Be Determined      Primary Diagnoses: Present on Admission:  Acute on chronic blood loss anemia  Closed displaced fracture of right femoral neck (HCC)  Displaced fracture of right femoral neck ([place])   I have reviewed the medical record, interviewed the patient and family, and examined the patient. The following aspects are pertinent.  Past Medical History:  Diagnosis Date   Arthritis    Brain aneurysm    ETOH abuse    intoxicated   Lung cancer ([place])    Social History   Socioeconomic  History   Marital status: Single    Spouse name: Not on file   Number of children: Not on file   Years of education: Not on file   Highest education level: Not on file  Occupational History   Not on file  Tobacco Use   Smoking status: Former   Smokeless tobacco: Never  Vaping Use   Vaping Use: Never used  Substance and Sexual Activity   Alcohol use: Yes    Comment: "I drink whenever I have money"   Drug use: No   Sexual activity: Not on file  Other Topics Concern   Not on file  Social History Narrative   Not on file   Social Determinants of Health   Financial Resource Strain: Not on file  Food Insecurity: Not on file  Transportation Needs: Not on file  Physical Activity: Not on file  Stress: Not on file  Social Connections: Not on  file   History reviewed. No pertinent family history. Scheduled Meds:  bisacodyl  10 mg Rectal Q0600   chlorhexidine gluconate (MEDLINE KIT)  15 mL Mouth Rinse BID   Chlorhexidine Gluconate Cloth  6 each Topical Daily   docusate  100 mg Per Tube BID   folic acid  1 mg Per Tube Daily   LORazepam  2 mg Intravenous STAT   mouth rinse  15 mL Mouth Rinse 10 times per day   midodrine  10 mg Per Tube Q8H   multivitamin with minerals  1 tablet Per Tube Daily   pantoprazole sodium  40 mg Per Tube Daily   phenobarbital  64.8 mg Per Tube BID   polyethylene glycol  17 g Per Tube Daily   senna  1 tablet Per Tube BID   sodium chloride flush  10-40 mL Intracatheter Q12H   thiamine  100 mg Per Tube Daily   Or   thiamine  100 mg Intravenous Daily   Continuous Infusions:  sodium chloride 10 mL/hr at 07/20/21 0800   feeding supplement (VITAL AF 1.2 CAL) 1,000 mL (07/20/21 0933)   levETIRAcetam Stopped (07/20/21 0338)   meropenem (MERREM) IV 1 g (07/20/21 0924)   norepinephrine (LEVOPHED) Adult infusion 1 mcg/min (07/20/21 0800)   promethazine (PHENERGAN) injection (IM or IVPB)     sodium bicarbonate 150 mEq in D5W infusion 100 mL/hr at 07/20/21 0800    PRN Meds:.acetaminophen, alum & mag hydroxide-simeth, fentaNYL (SUBLIMAZE) injection, lip balm, menthol-cetylpyridinium **OR** phenol, metoCLOPramide (REGLAN) injection, polyethylene glycol, promethazine (PHENERGAN) injection (IM or IVPB), sodium chloride flush Medications Prior to Admission:  Prior to Admission medications   Medication Sig Start Date End Date Taking? Authorizing Provider  HYDROcodone-acetaminophen (NORCO/VICODIN) 5-325 MG tablet Take 1 tablet by mouth every 6 (six) hours as needed for moderate pain. 05/27/21  Yes Fredia Sorrow, MD  naproxen sodium (ALEVE) 220 MG tablet Take 220 mg by mouth daily as needed (pain).   Yes [provider]  indomethacin (INDOCIN) 50 MG capsule Take 1 capsule (50 mg total) by mouth 2 (two) times daily as needed. Patient not taking: Reported on 07/03/2021 02/19/21   Volney American, PA-C  meloxicam (MOBIC) 15 MG tablet Take 1 tablet (15 mg total) by mouth daily. Patient not taking: Reported on 07/03/2021 12/17/20   Wallene Huh, DPM   No Known Allergies Review of Systems  Unable to perform ROS: Intubated   Physical Exam Constitutional:      Appearance: He is underweight. He is ill-appearing.     Interventions: He is intubated.  Pulmonary:     Effort: He is intubated.  Skin:    General: Skin is warm and dry.  Neurological:     Mental Status: He is unresponsive.    Vital Signs: BP 127/72    Pulse 60    Temp 97.6 F (36.4 C) (Oral)    Resp (!) 28    Ht '5\' 5"'  (1.651 m)    Wt 43.8 kg    SpO2 100%    BMI 16.07 kg/m  Pain Scale: CPOT   Pain Score: Asleep   SpO2: SpO2: 100 % O2 Device:SpO2: 100 % O2 Flow Rate: .O2 Flow Rate (L/min): 5 L/min  IO: Intake/output summary:  Intake/Output Summary (Last 24 hours) at 07/20/2021 1146 Last data filed at 07/20/2021 0800 Gross per 24 hour  Intake 1479.57 ml  Output 550 ml  Net 929.57 ml    LBM: Last BM Date: 07/20/21 Baseline Weight: Weight: 45.5 kg  Most recent weight:  Weight: 43.8 kg     Palliative Assessment/Data: 20 %    PMT will continue support holistically   Discussed with Dr. Tamala Julian and bedside RN.  Signed by: Wadie Lessen, NP   Please contact Palliative Medicine Team phone at [phone] for questions and concerns.  For individual provider: See Shea Evans

## 2021-07-20 NOTE — Progress Notes (Signed)
PT Cancellation Note  Patient Details Name: Gary Stevenson MRN: [id number] DOB: January 02, 1941   Cancelled Treatment:    Reason Eval/Treat Not Completed: Patient not medically ready (pt remains on vent with medical decline. Will sign off and await new order should pt status change)   Paris [place] B Broderic Bara 07/20/2021, 7:41 AM Bayard Males, PT Acute Rehabilitation Services Pager: [phone] Office: [phone]

## 2021-07-20 NOTE — Progress Notes (Signed)
OT Cancellation Note  Patient Details Name: Gary Stevenson MRN: [id number] DOB: 1941-06-05   Cancelled Treatment:    Reason Eval/Treat Not Completed: Patient not medically ready pt remains on vent with medical decline and unresponsive. Will sign off and await new OT order should pt status change  Layla Maw 07/20/2021, 10:40 AM

## 2021-07-20 NOTE — Progress Notes (Signed)
NAME:  Gary Stevenson, MRN:  [id number], DOB:  1940-07-24, LOS: 12 ADMISSION DATE:  07/21/2021, CONSULTATION DATE: 07/14/2021 REFERRING MD:  Marlyce Huge, CHIEF COMPLAINT:  AMS, Respiratory failure   History of Present Illness:  Gary Stevenson is a 81 y.o. M with PMH significant for right frontal subdural hematomas status post surgical evacuation in 2012, lung cancer status post lobectomy, alcohol abuse, CKD who presented to the ED 1/12 after a fall and was found to have R acetabular fx with interval displacement and underwent R total hip arthroplasty on 07/23/2021.  He was later found with decreased responsiveness and seizure activity that lasted for around 1 minute.  He was seen by Neurology and started on Phenobarbital and Keppra with LTM EEG.  Neurology felt seizure was most likely secondary to subdural hematoma and he was gradually improving until 1/17 when he was noted to have worsening mental status again with hypotension.  ABG 7.134 and pCO2 60.7, patient's mental status was not amenable for Bipap, so PCCM consulted for transfer.   Pertinent  Medical History   Past Medical History:  Diagnosis Date   Arthritis    Brain aneurysm    ETOH abuse    intoxicated   Lung cancer ([place])    Significant Hospital Events: Including procedures, antibiotic start and stop dates in addition to other pertinent events   1/11 admission for R hip fx 1/12 R hip arthoplasty 1/13 Sz activity 1/17 worsening mental status, PCCM consult and ICU txfr, intubated  1/18 CT head with stable small bilateral subdural hematomas but no acute intracranial abnormalities. 1/18 EEG >> abnormal comatose EEG, no epileptiform discharges 1/18 MRI brain >> stable bilateral frontal subacute subdural hematoma.  No new intracranial abnormalities. 1/19 CXR>> stable multilobar pneumonia, resolved RUL collapse, continued LLL consolidation/collapse 1/19 cEEG >>BIPDs with some brief electrographic seizures during the initial portion of the  recording, indicative of a multifocal epileptogenic disturbance. Seizures were improved in the afternoon and evening 1/21 worsening shock, acidosis.  Antibiotics changed to Vanco and meropenem.  Made DNR after family meeting  Interim History / Subjective:   Afebrile, twitching noted overnight  Objective   Blood pressure (!) 100/50, pulse 69, temperature 97.6 F (36.4 C), temperature source Oral, resp. rate (!) 28, height 5\' 5"  (1.651 m), weight 43.8 kg, SpO2 93 %.    Vent Mode: PRVC FiO2 (%):  [40 %] 40 % Set Rate:  [28 bmp] 28 bmp Vt Set:  [490 mL] 490 mL PEEP:  [5 cmH20] 5 cmH20 Plateau Pressure:  [20 cmH20-23 cmH20] 20 cmH20   Intake/Output Summary (Last 24 hours) at 07/20/2021 0701 Last data filed at 07/20/2021 0600 Gross per 24 hour  Intake 1905.19 ml  Output 550 ml  Net 1355.19 ml    Filed Weights   07/18/21 0616 07/20/21 0400 07/20/21 0500  Weight: 48.3 kg 43.8 kg 43.8 kg    Constitutional: ill appearing comatose man  Eyes: pupils small, sluggish to light, equal (see neuro exam) Ears, nose, mouth, and throat: ETT in place with copious mucopurulent secretions Cardiovascular: RRR, ext warm Respiratory: rhonci bilaterally, passive on vent Gastrointestinal: + BS, soft Skin: No rashes, normal turgor, global anasarca, R hip site CDI Neurologic: No corneals, sluggish pupils, +cough/gag, minimal oculocephalic, cannot get him to respond to pain Psychiatric: cannot assess  Stable anemia WBC stable Plts stable Cr stable Glucose low Bicarb low CXR stable R>L basilar opacities  Resolved Hospital Problem list   Hypernatremia Respiratory acidosis  Assessment & Plan:  Acute metabolic  encephalopathy Chronic subdural hematoma History of seizure Extensive embolic strokes- Poor exam remains off sedation - Neurology following, AEDs at their discretion - Seizure precautions - ?need for [facility] and TEE? Not sure changes overall outcome here  Acute hypoxic hypercarbic  respiratory failure Klebsiella, Enterobacter pneumonia Septic shock - DC vanc - Meropenem x 4 more days then stop, should be ~8 days coverage  AKI on CKD stage IIIa -Follow urine output and creatinine -Avoid nephrotoxins -Fluids as below  Ileus - Check KUB - Consider reglan + trickle feed challenge  Close displaced fracture of the right femoral neck- Status post right hip total arthroplasty on 1/12 by orthopedic surgery.  Surgical site with mild edema covered with dressing. C/d/l  Severe protein caloric malnutrition Failure to thrive Hypoglycemia Acidemia related to CKD and GI losses - Change fluids to D5 w/ bicarb  Goals of care 1/22 by Dr. Vaughan Browner; no escalation, continue current level of care, DNR; not HD canddiate  Best Practice (right click and "Reselect all SmartList Selections" daily)   Diet/type: NPO DVT prophylaxis: not indicated.  On hold due to recent subdural hemorrhage GI prophylaxis: PPI Lines: Central line and Arterial Line Foley:  N/A Code Status:  full code Last date of multidisciplinary goals of care discussion [1/21, spoke to daughter at bedside]   Patient critically ill due to shock, resp failure Interventions to address this today vent/sedation titration Risk of deterioration without these interventions is high  I personally spent 33 minutes providing critical care not including any separately billable procedures  Erskine Emery MD [facility]  Prefer epic messenger for cross cover needs If after hours, please call E-link

## 2021-07-20 NOTE — TOC CAGE-AID Note (Signed)
Transition of Care [facility]) - CAGE-AID Screening   Patient Details  Name: Gary Stevenson MRN: [id number] Date of Birth: April 18, 1941  Transition of Care [facility]) CM/SW Contact:    Aven Cegielski C Tarpley[place]Carter, [place] Phone Number: 07/20/2021, 3:29 PM   Clinical Narrative: Pt is unable to participate in Cage Aid.  Dezaria Methot Tarpley-Carter, MSW, LCSW-A Pronouns:  She/Her/Hers [place] Transitions of Care Clinical Social Worker Direct Number:  [phone] [email or web address]  CAGE-AID Screening: Substance Abuse Screening unable to be completed due to: : Patient unable to participate             Substance Abuse Education Offered: No

## 2021-07-20 NOTE — Progress Notes (Signed)
Unilateral facial twitching in the setting of a previously epileptiform EEG. Will treat with ativan x 1.   Roland Rack, MD [facility] [phone]  If 7pm- 7am, please page neurology on call as listed in [place].

## 2021-07-20 NOTE — Progress Notes (Addendum)
STROKE TEAM PROGRESS NOTE   ATTENDING NOTE: I reviewed above note and agree with the assessment and plan. Pt was seen and examined.   81 year old male with history of right frontal SDH status post surgery in 2012, lung cancer status post right upper lobe resection 01/2009, alcohol abuse, CKD admitted on 07/14/2021 for a fall resulting right femoral and hip fracture.  Status post orthopedic surgery but then developed a seizure put on Keppra.  Long-term EEG showed possible right hemisphere nonconvulsive status, added phenobarbital after load.  MRI x2 showed no acute infarct.  On 07/15/2021 patient became unresponsive with hypotension and desaturation.  He was intubated for airway protection.  EEG showed no seizure but epileptiform morphology.  Concerning for septic shock, put on cefepime and Vanco.  Blood culture negative so far.  After LTM removed, MRI was done on 07/19/2021 showed extensive bilateral cerebellum, bilateral septal and left pontine infarct.  MRA head and neck unremarkable except left ICA siphon 2.5 mm aneurysm.  EF 50 to 55%.  Creatinine 2.48-2.14.  Hemoglobin 7.7-9.5.  LE venous Doppler no DVT.  LDL and A1c pending.  On exam, daughter at bedside, on our initial discussion, she wanted everything done, even with a tracheostomy feeding tube and long-term ventilation.  I showed her my exam that patient intubated, in coma, not responsive, not open eyes on voice, eyes midline, pupil bilaterally 1.5 mm, nonreactive to light, no doll's eyes, no corneal bilaterally, very weak gag reflexes present, not moving extremities on pain summation.  I let daughter understand the poor exam and likely poor outcome given the exam and MRI findings. She seems to understand more of the gravity of patient condition.  She stated the patient has a living will and she would bring over.  She is in agreement to discuss with palliative care services.  Etiology for patient stroke unclear, could be due to endocarditis secondary  to sepsis, occult A. fib, or hypoperfusion due to hypotension.  If family still wants aggressive care, may consider TEE to rule out endocarditis.  Avoid low BP, currently on midodrine.  Continue telemetry monitoring.  Continue supportive care.  No antiplatelet at this time until endocarditis is ruled out.  For detailed assessment and plan, please refer to above as I have made changes wherever appropriate.   Gary Hawking, Gary Stevenson Stroke Neurology 07/20/2021 7:03 PM  This patient is critically ill due to extensive posterior circulation infarcts, respiratory failure, sepsis, septic shock and at significant risk of neurological worsening, death form recurrent stroke, hemorrhagic transformation, renal failure, heart failure, sepsis. This patient's care requires constant monitoring of vital signs, hemodynamics, respiratory and cardiac monitoring, review of multiple databases, neurological assessment, discussion with family, other specialists and medical decision making of high complexity. I spent 45 minutes of neurocritical care time in the care of this patient. I had long discussion with daughter at bedside, updated pt current condition, treatment plan and potential prognosis, and answered all the questions.  She expressed understanding and appreciation.      INTERVAL HISTORY His grandson at the bedside. Neurological function is poor. No response to voice or noxious stimuli. Eyes appear dysconjugate with forced opening. Family meeting for [place] discussion pending. Plan for TEE to rule out endocarditis as source of stroke then start DAPT therapy if appropriate. Lipid panel and Ha1C ordered with morning labs.   Update: TEE scheduled for tomorrow- 1/23  Vitals:   07/20/21 0615 07/20/21 0630 07/20/21 0645 07/20/21 0700  BP:      Pulse:  69 67 69 65  Resp: (!) 28 (!) 28 (!) 28 (!) 22  Temp:      TempSrc:      SpO2: 100% 100% 93% 98%  Weight:      Height:       CBC:  Recent Labs  Lab 07/19/21 0500  07/19/21 0805 07/20/21 0412  WBC 8.5  --  9.9  HGB 7.7* 9.5* 8.0*  HCT 22.3* 28.0* 22.6*  MCV 85.1  --  85.3  PLT 222  --  630   Basic Metabolic Panel:  Recent Labs  Lab 07/19/21 0500 07/19/21 0805 07/20/21 0412  NA 136 137 137  K 4.0 4.1 4.0  CL 111  --  112*  CO2 14*  --  13*  GLUCOSE 103*  --  100*  BUN 53*  --  59*  CREATININE 2.14*  --  2.42*  CALCIUM 7.3*  --  7.6*  MG 2.1  --  2.1  PHOS 3.6  --  3.9   Lipid Panel:  Recent Labs  Lab 07/15/21 0208  TRIG 14   HgbA1c: No results for input(s): HGBA1C in the last 168 hours. Urine Drug Screen: No results for input(s): LABOPIA, COCAINSCRNUR, LABBENZ, AMPHETMU, THCU, LABBARB in the last 168 hours.  Alcohol Level No results for input(s): ETH in the last 168 hours.  IMAGING past 24 hours MR ANGIO HEAD WO CONTRAST  Result Date: 07/19/2021 CLINICAL DATA:  Initial evaluation for neuro deficit, stroke. EXAM: MRA NECK WITHOUT CONTRAST MRA HEAD WITHOUT CONTRAST TECHNIQUE: Angiographic images of the Circle of Willis were acquired using MRA technique without intravenous contrast. COMPARISON:  None. FINDINGS: MRA NECK FINDINGS AORTIC ARCH: Examination technically limited by lack of IV contrast. Aortic arch and origin of the great vessels not well seen on this exam. RIGHT CAROTID SYSTEM: Visualized right CCA patent from its origin to the bifurcation without stenosis. Atheromatous irregularity about the right carotid bulb/proximal right ICA without hemodynamically significant stenosis. Right ICA patent distally without stenosis or evidence for dissection. LEFT CAROTID SYSTEM: Left CCA patent from its origin to the bifurcation without stenosis. Atheromatous irregularity about the left carotid bulb/proximal left ICA without hemodynamically significant stenosis. Left ICA patent distally without stenosis or evidence for dissection. VERTEBRAL ARTERIES: Both vertebral arteries arise from subclavian arteries. No visible proximal subclavian artery  stenosis. Vertebral arteries largely code dominant and patent within the neck without stenosis or evidence for dissection. MRA HEAD FINDINGS ANTERIOR CIRCULATION: Visualized distal cervical segments of the internal carotid arteries are patent with antegrade flow. Petrous, cavernous, and supraclinoid segments patent without stenosis. Approximate 5 mm wide necked outpouching arising from the cavernous left ICA suspicious for aneurysm (series 2, image 76). A1 segments patent bilaterally. Normal anterior communicating artery complex. Anterior cerebral arteries patent without stenosis. Normal in stenosis or occlusion. Normal MCA bifurcations. Distal MCA branches well perfused and symmetric. POSTERIOR CIRCULATION: Both V4 segments patent without stenosis. Both PICA origins patent and normal. Basilar patent without stenosis. Superior cerebellar arteries patent bilaterally. Both PCAs primarily supplied via the basilar well perfused or distal aspects. Extra-axial collections overlying the right greater than left frontal convexities again noted, grossly similar. Scattered ischemic infarcts also grossly similar to prior. IMPRESSION: MRA HEAD IMPRESSION: 1. Negative MRA for large vessel occlusion. No hemodynamically significant or correctable stenosis. 2. 5 mm wide necked outpouching extending from the cavernous left ICA, suspicious for possible aneurysm. MRA NECK IMPRESSION: 1. Atheromatous irregularity about the left greater than right carotid bifurcations without hemodynamically significant stenosis.  2. Wide patency of both vertebral arteries within the neck. 3. No evidence for dissection or other acute vascular abnormality. No identifiable embolic source. Electronically Signed   By: Jeannine Boga M.D.   On: 07/19/2021 19:30   MR ANGIO NECK WO CONTRAST  Result Date: 07/19/2021 CLINICAL DATA:  Initial evaluation for neuro deficit, stroke. EXAM: MRA NECK WITHOUT CONTRAST MRA HEAD WITHOUT CONTRAST TECHNIQUE:  Angiographic images of the Circle of Willis were acquired using MRA technique without intravenous contrast. COMPARISON:  None. FINDINGS: MRA NECK FINDINGS AORTIC ARCH: Examination technically limited by lack of IV contrast. Aortic arch and origin of the great vessels not well seen on this exam. RIGHT CAROTID SYSTEM: Visualized right CCA patent from its origin to the bifurcation without stenosis. Atheromatous irregularity about the right carotid bulb/proximal right ICA without hemodynamically significant stenosis. Right ICA patent distally without stenosis or evidence for dissection. LEFT CAROTID SYSTEM: Left CCA patent from its origin to the bifurcation without stenosis. Atheromatous irregularity about the left carotid bulb/proximal left ICA without hemodynamically significant stenosis. Left ICA patent distally without stenosis or evidence for dissection. VERTEBRAL ARTERIES: Both vertebral arteries arise from subclavian arteries. No visible proximal subclavian artery stenosis. Vertebral arteries largely code dominant and patent within the neck without stenosis or evidence for dissection. MRA HEAD FINDINGS ANTERIOR CIRCULATION: Visualized distal cervical segments of the internal carotid arteries are patent with antegrade flow. Petrous, cavernous, and supraclinoid segments patent without stenosis. Approximate 5 mm wide necked outpouching arising from the cavernous left ICA suspicious for aneurysm (series 2, image 76). A1 segments patent bilaterally. Normal anterior communicating artery complex. Anterior cerebral arteries patent without stenosis. Normal in stenosis or occlusion. Normal MCA bifurcations. Distal MCA branches well perfused and symmetric. POSTERIOR CIRCULATION: Both V4 segments patent without stenosis. Both PICA origins patent and normal. Basilar patent without stenosis. Superior cerebellar arteries patent bilaterally. Both PCAs primarily supplied via the basilar well perfused or distal aspects.  Extra-axial collections overlying the right greater than left frontal convexities again noted, grossly similar. Scattered ischemic infarcts also grossly similar to prior. IMPRESSION: MRA HEAD IMPRESSION: 1. Negative MRA for large vessel occlusion. No hemodynamically significant or correctable stenosis. 2. 5 mm wide necked outpouching extending from the cavernous left ICA, suspicious for possible aneurysm. MRA NECK IMPRESSION: 1. Atheromatous irregularity about the left greater than right carotid bifurcations without hemodynamically significant stenosis. 2. Wide patency of both vertebral arteries within the neck. 3. No evidence for dissection or other acute vascular abnormality. No identifiable embolic source. Electronically Signed   By: Jeannine Boga M.D.   On: 07/19/2021 19:30    PHYSICAL EXAM  Physical Exam  Constitutional: Ill appearing african [place] male  Cardiovascular: NSR 60s, hypotensive- Systolic BP in the 27P during exam Respiratory: Intubated  Neuro: Patient is intubated with no sedation running. Does not respond to noxious stimuli, no withdrawal to pain or spontaneous movement in any extremity. Right eye slightly deviated to the right, left eye midline. Minimal dolls eyes. Pupils 57mm and sluggish, no corneal reflex. No gag reflex, minimal cough with endotracheal suctioning,    ASSESSMENT/PLAN Mr. MELESIO MADARA is a 81 y.o. male with history of prior tobacco use, brain aneurysm, right frontal SDH s/p surgical evacuation in 2012, lung cancer s/p RML lobectomy 01/2009, ETOH abuse, and CKD who is admitted s/p fall 07/01/2021 for resultant right femoral neck fracutre and right acetabular fracture. Initially neurology was consulted for post-operative concern for seizure. A few hours after the procedure, the patient was seen  to have high amplitude right upper extremity "shaking" bilaterally that lasted approximately 1 minute in duration as witnessed by nursing. The onset was not  witnessed. He has been encephalopathic since after surgery, but was noted to be alert and oriented prior to procedure. On 1/12 he underwent a R hip arthroplasty. 1/13 seizure activity was noted.   1/17- transferred to ICU and intubated. 1/18 MRI brain showed stable bilateral frontal subacute subdural hematoma.  1/19 cEEG- BIPDs with some brief electrographic seizures during the initial portion of the recording, indicative of a multifocal epileptogenic disturbance.  1/22- MRI - Extensive acute infarcts in the bilateral posterior circulation. Subtotal involvement of the left pons. Scattered bilateral cerebellar, occipital, and parietal lobe involvement. Cytotoxic edema without acute hemorrhage. Right > left mostly subfrontal SDH is stable since scan on 07/15/2021.   If family wishes to continue with stroke work up, plan for TEE in patient and for a loop recorder or 30 day monitor at discharge.   Stroke:  Scattered bilateral cerebellar, occipital, and parietal infarcts with left pontine involvement likely secondary to cardio embolic source Code Stroke- Stable small bilateral subdural hematomas over the anterior and inferior aspects of the frontal lobes bilaterally not significantly changed from the prior exam. There is mild mass effect on the frontal lobe on the right with no midline shift. Atrophy with chronic microvascular ischemic changes. 1/22/20233- MRI  Extensive acute infarcts in the bilateral posterior circulation. Subtotal involvement of the left pons. Scattered bilateral cerebellar, occipital, and parietal lobe involvement. Cytotoxic edema without acute hemorrhage. Right > left mostly subfrontal SDH is stable since 07/15/2021.  MRA  No LVO, 5 mm wide necked outpouching extending from the cavernous left ICA, suspicious for possible aneurysm. 2D Echo 50-55% Lower extremity DVT LDL No results found for requested labs within last 26280 hours. HgbA1c No results found for requested labs within last  26280 hours. VTE prophylaxis - SCDs No antithrombotic prior to admission, now on No antithrombotic. Rule out endocarditis then start DAPT if appropriate Therapy recommendations:  Pending Disposition:  Pending  Hypertension Home meds:  None Stable Permissive hypertension (OK if < 220/120) but gradually normalize in 5-7 days Long-term BP goal normotensive  Hyperlipidemia Home meds:  None, resumed in hospital LDL No results found for requested labs within last 26280 hours., goal < 70 Continue statin at discharge  Other Stroke Risk Factors Advanced Age >/= 21  Cigarette smoker, advised to stop smoking ETOH use, alcohol level, 2 drink(s) a day Obesity, Body mass index is 16.07 kg/m., BMI >/= 30 associated with increased stroke risk, recommend weight loss, diet and exercise as appropriate  Hx stroke/TIA Chronic SDH- now noted to be stable on recent scans  Other Active Problems Respiratory Failure- Hypoxic hypercarbic Acute metabolic encephalopathy AKI on CKD Close displaced fracture of the right femoral neck Status post right hip total arthroplasty on 1/12 by orthopedic surgery.   Severe protein caloric malnutrition Failure to thrive Hypoglycemia Acidemia related to CKD and GI losses D5 w/ bicarb Klebsiella, Enterobacter pneumonia Septic shock DC vanc, on Meropenem  Seizure History of seizure on keppra, recent seizure activity?  Hospital day # 12  Patient seen and examined by NP/APP with Gary. Gary to update note as needed.   Janine Ores, DNP, FNP-BC [facility] Pager: [phone]   To contact Stroke Continuity provider, please refer to [email or web address] After hours, contact General Neurology

## 2021-07-21 DIAGNOSIS — S72001A Fracture of unspecified part of neck of right femur, initial encounter for closed fracture: Secondary | ICD-10-CM | POA: Diagnosis not present

## 2021-07-21 LAB — BASIC METABOLIC PANEL
Anion gap: 11 (ref 5–15)
BUN: 59 mg/dL — ABNORMAL HIGH (ref 8–23)
CO2: 19 mmol/L — ABNORMAL LOW (ref 22–32)
Calcium: 7.3 mg/dL — ABNORMAL LOW (ref 8.9–10.3)
Chloride: 107 mmol/L (ref 98–111)
Creatinine, Ser: 2.08 mg/dL — ABNORMAL HIGH (ref 0.61–1.24)
GFR, Estimated: 32 mL/min — ABNORMAL LOW (ref >60)
Glucose, Bld: 128 mg/dL — ABNORMAL HIGH (ref 70–99)
Potassium: 3.4 mmol/L — ABNORMAL LOW (ref 3.5–5.1)
Sodium: 137 mmol/L (ref 135–145)

## 2021-07-21 LAB — GLUCOSE, CAPILLARY
Glucose-Capillary: 104 mg/dL — ABNORMAL HIGH (ref 70–99)
Glucose-Capillary: 104 mg/dL — ABNORMAL HIGH (ref 70–99)
Glucose-Capillary: 98 mg/dL (ref 70–99)
Glucose-Capillary: 84 mg/dL (ref 70–99)
Glucose-Capillary: 98 mg/dL (ref 70–99)
Glucose-Capillary: 103 mg/dL — ABNORMAL HIGH (ref 70–99)

## 2021-07-21 LAB — PHENOBARBITAL LEVEL: Phenobarbital: 16.4 ug/mL (ref 15.0–30.0)

## 2021-07-21 LAB — POCT I-STAT 7, (LYTES, BLD GAS, ICA,H+H)
Acid-base deficit: 1 mmol/L (ref 0.0–2.0)
Bicarbonate: 21.1 mmol/L (ref 20.0–28.0)
Calcium, Ion: 1.11 mmol/L — ABNORMAL LOW (ref 1.15–1.40)
HCT: 30 % — ABNORMAL LOW (ref 39.0–52.0)
Hemoglobin: 10.2 g/dL — ABNORMAL LOW (ref 13.0–17.0)
O2 Saturation: 90 %
Patient temperature: 98.3
Potassium: 3.7 mmol/L (ref 3.5–5.1)
Sodium: 140 mmol/L (ref 135–145)
TCO2: 22 mmol/L (ref 22–32)
pCO2 arterial: 27.6 mmHg — ABNORMAL LOW (ref 32.0–48.0)
pH, Arterial: 7.491 — ABNORMAL HIGH (ref 7.350–7.450)
pO2, Arterial: 53 mmHg — ABNORMAL LOW (ref 83.0–108.0)

## 2021-07-21 LAB — CBC
HCT: 25 % — ABNORMAL LOW (ref 39.0–52.0)
Hemoglobin: 8.5 g/dL — ABNORMAL LOW (ref 13.0–17.0)
MCH: 28.7 pg (ref 26.0–34.0)
MCHC: 34 g/dL (ref 30.0–36.0)
MCV: 84.5 fL (ref 80.0–100.0)
Platelets: 222 10*3/uL (ref 150–400)
RBC: 2.96 MIL/uL — ABNORMAL LOW (ref 4.22–5.81)
RDW: 17.6 % — ABNORMAL HIGH (ref 11.5–15.5)
WBC: 11.6 10*3/uL — ABNORMAL HIGH (ref 4.0–10.5)
nRBC: 1.1 % — ABNORMAL HIGH (ref 0.0–0.2)

## 2021-07-21 LAB — LIPID PANEL
Cholesterol: 58 mg/dL (ref 0–200)
HDL: <10 mg/dL — ABNORMAL LOW (ref >40)
Triglycerides: 83 mg/dL (ref <150)
VLDL: 17 mg/dL (ref 0–40)

## 2021-07-21 LAB — HEMOGLOBIN A1C
Hgb A1c MFr Bld: 5.7 % — ABNORMAL HIGH (ref 4.8–5.6)
Mean Plasma Glucose: 117 mg/dL

## 2021-07-21 LAB — PHOSPHORUS: Phosphorus: 3 mg/dL (ref 2.5–4.6)

## 2021-07-21 LAB — MAGNESIUM: Magnesium: 2 mg/dL (ref 1.7–2.4)

## 2021-07-21 MED ORDER — PHENOBARBITAL SODIUM 65 MG/ML IJ SOLN: 65.0000 mg | Freq: Two times a day (BID) | INTRAMUSCULAR | Status: DC

## 2021-07-21 MED ORDER — SODIUM CHLORIDE 0.9 % IV SOLN
1.0000 g | Freq: Two times a day (BID) | INTRAVENOUS | Status: AC
  Administered 2021-07-21 – 2021-07-22 (×4): 1 g via INTRAVENOUS
  Filled 2021-07-21 (×4): qty 1

## 2021-07-21 MED ORDER — PHENOBARBITAL 32.4 MG PO TABS
64.8000 mg | ORAL_TABLET | Freq: Two times a day (BID) | ORAL | Status: DC
  Administered 2021-07-21 (×2): 64.8 mg
  Filled 2021-07-21 (×2): qty 2

## 2021-07-21 MED ORDER — NOREPINEPHRINE 4 MG/250ML-% IV SOLN: 0.0000 ug/min | INTRAVENOUS | Status: DC

## 2021-07-21 MED ORDER — ENOXAPARIN SODIUM 30 MG/0.3ML IJ SOSY
30.0000 mg | PREFILLED_SYRINGE | INTRAMUSCULAR | Status: DC
  Administered 2021-07-21 – 2021-07-22 (×2): 30 mg via SUBCUTANEOUS
  Filled 2021-07-21 (×2): qty 0.3

## 2021-07-21 MED ORDER — NOREPINEPHRINE 16 MG/250ML-% IV SOLN
0.0000 ug/min | INTRAVENOUS | Status: DC
  Administered 2021-07-21: 21:00:00 1 ug/min via INTRAVENOUS

## 2021-07-21 MED ORDER — ENOXAPARIN SODIUM 40 MG/0.4ML IJ SOSY
40.0000 mg | PREFILLED_SYRINGE | INTRAMUSCULAR | Status: DC
Start: 2021-07-21 — End: 2021-07-21

## 2021-07-21 MED ORDER — BISACODYL 10 MG RE SUPP: 10.0000 mg | Freq: Every day | RECTAL | Status: DC

## 2021-07-21 MED ORDER — POTASSIUM CHLORIDE 20 MEQ PO PACK
20.0000 meq | PACK | Freq: Once | ORAL | Status: AC
  Administered 2021-07-21: 05:00:00 20 meq
  Filled 2021-07-21: qty 1

## 2021-07-21 MED ORDER — SODIUM CHLORIDE 0.9 % IV SOLN: 1.0000 g | Freq: Two times a day (BID) | INTRAVENOUS | Status: DC

## 2021-07-21 NOTE — Progress Notes (Addendum)
eLink Physician-Brief Progress Note Patient Name: Gary Stevenson DOB: September 30, 1940 MRN: [id number]   Date of Service  07/21/2021  HPI/Events of Note  Hypotensive with SBP 80s. Reviewed notes regarding ongoing [place]. Last discussion with family considering trach/PEG if offered.  No evidence of bleed. Patient has central line. Hold on IVF due to fluid balance +22L since admission.   eICU Interventions  Will start levophed for presumed septic shock Order ABG to check pH   3:00 AM Repleted K, Phos and Mg per protocol     Intervention Category Major Interventions: Hypotension - evaluation and management  [name] 07/21/2021, 8:44 PM

## 2021-07-21 NOTE — Progress Notes (Signed)
The patient was seen and examined at his bedside in the medical ICU.  I did come to the bedside to evaluate the patient for assessment for transesophageal echocardiogram. The patient is intubated and sedated.  Physical exam: GEN: Intubated and sedated  HEENT: Swollen tongue, poor dentition NECK: No JVD; No carotid bruits LYMPHATICS: No lymphadenopathy CARDIAC: S1S2 noted, RRR, no murmurs, rubs, gallops RESPIRATORY: Bronchial breath sounds  ABDOMEN: Soft, non-tender, non-distended, bowel sounds noted, no guarding EXTREMITIES: Bilateral edema MUSCULOSKELETAL: Unable to perform SKIN: Warm and dry NEUROLOGIC: Unable to perform  Assessment & Plan   Recent CVA  The primary team has requested for a transesophageal echocardiogram.  At this time after assessing the patient completely at his bedside in the medical ICU, he does have significant swollen tongue with concern for blood in his suction tube.  In light of his swollen tongue there may be concern for obstruction and safety as we passed the transesophageal probe.  It would be in the patient's best interest to wait until it is clinically safe to proceed with a transesophageal echocardiogram to avoid any adverse outcome in this already critically ill and fragile patient.   The ICU physician (Dr. Tamala Julian), the ICU resident as well as the bedside nurse worse present during the time of my assessment.  All in agreement.   I did call the patient's daughter Evie Lacks [phone]) who is listed on his chart.

## 2021-07-21 NOTE — TOC Progression Note (Addendum)
Transition of Care [facility]) - Progression Note    Patient Details  Name: Gary Stevenson MRN: [id number] Date of Birth: 05-28-1941  Transition of [facility]) CM/SW [place], RN Phone Number:[phone]  07/21/2021, 3:39 PM  Clinical Narrative:    Patient remains medically unstable for disposition planning. Family pursuing aggressive care. Will not be appropriate for CIR. Most likely will need long term care. Disposition needs TBD.      Barriers to Discharge: Continued Medical Work up  Expected Discharge Plan and Services                                                 Social Determinants of Health (SDOH) Interventions    Readmission Risk Interventions No flowsheet data found.

## 2021-07-21 NOTE — Progress Notes (Signed)
Nutrition Follow-up  DOCUMENTATION CODES:   Severe malnutrition in context of chronic illness, Underweight  INTERVENTION:   Continue tube feeding via Cortrak tube: Vital AF 1.2 at goal rate of 60 ml/h (1440 ml per day)  Provides 1728 kcal, 108 gm protein, 1168 ml free water daily  When IVF discontinued, recommend free water flushes 150 ml every 6 hours.   NUTRITION DIAGNOSIS:   Severe Malnutrition related to chronic illness as evidenced by severe fat depletion, severe muscle depletion.  Ongoing   GOAL:   Patient will meet greater than or equal to 90% of their needs  Progressing   MONITOR:   Diet advancement, PO intake, Supplement acceptance, Labs, Weight trends, Skin  REASON FOR ASSESSMENT:   Consult Hip fracture protocol  ASSESSMENT:   81 y.o. male with medical history of alcohol abuse, lung cancer, brain aneurysm, and arthritis. He was admitted due to low hemoglobin with hx of chronic anemia in preparation for elective total R hip replacement surgery. Found to have bilateral embolic strokes.  Discussed patient in ICU rounds and with RN today. Off pressors. Receiving antibiotics for one more day. Palliative care team following. Family is deciding on goals of care.   Cortrak was replaced 1/20, tip is in the pyloric region per X-ray 1/23.  TF has been on hold since 1/21 d/t ileus. Per x-ray 1/23 ileus resolving. Tube feeding resumed at 20 ml/h yesterday, currently receiving Vital AF 1.2 at 60 ml/h, goal rate, tolerating well. Low K likely related to phenobarbital use.   Patient remains intubated on ventilator support MV: 10 L/min Temp (24hrs), Avg:96.7 F (35.9 C), Min:92.8 F (33.8 C), Max:97.6 F (36.4 C)   Labs reviewed. K 3.4, has been replaced with 20 mEq KCl via tube this morning. CBG: [id number]  Medications reviewed and include Colace, folic acid, multivitamin with minerals, Protonix, Phenobarbital, Miralax, Senokot, thiamine, Keppra, sodium  bicarb. IVF: D5 with sodium bicarb at 100 ml/h  I/O net +20.8 L since admission  Admission weight 45.5 kg Current weight 43.8 kg  Diet Order:   Diet Order             Diet NPO time specified  Diet effective now                   EDUCATION NEEDS:   No education needs have been identified at this time  Skin:  Skin Assessment: Skin Integrity Issues: Skin Integrity Issues:: Stage III Stage II: scrotum Stage III: sacrum Incisions: closed rt hip  Last BM:  1/24 type 7  Height:   Ht Readings from Last 1 Encounters:  07/14/21 5\' 5"  (1.651 m)    Weight:   Wt Readings from Last 1 Encounters:  07/20/21 43.8 kg    Ideal Body Weight:  56.8 kg  BMI:  Body mass index is 16.07 kg/m.  Estimated Nutritional Needs:   Kcal:  1600-1800 kcal  Protein:  80-95 grams  Fluid:  >/= 1.7 L/day    Lucas Mallow RD, LDN, CNSC Please refer to Amion for contact information.

## 2021-07-21 NOTE — Progress Notes (Signed)
PCCM Note  Spoke with Dr. Harriet Masson with cardiology who has concerns about the appropriateness of a TEE in this patient.  I explained that I would like the exam to determine need for anticoagulation and help with family discussions.  I await her final decision in ability to perform TEE on this patient.  Erskine Emery MD PCCM

## 2021-07-21 NOTE — Progress Notes (Signed)
NAME:  Gary Stevenson, MRN:  [id number], DOB:  03/24/1941, LOS: 88 ADMISSION DATE:  07/11/2021, CONSULTATION DATE: 07/14/2021 REFERRING MD:  Marlyce Huge, CHIEF COMPLAINT:  AMS, Respiratory failure   History of Present Illness:  Gary Stevenson is a 81 y.o. M with PMH significant for right frontal subdural hematomas status post surgical evacuation in 2012, lung cancer status post lobectomy, alcohol abuse, CKD who presented to the ED 1/12 after a fall and was found to have R acetabular fx with interval displacement and underwent R total hip arthroplasty on 07/23/2021.  He was later found with decreased responsiveness and seizure activity that lasted for around 1 minute.  He was seen by Neurology and started on Phenobarbital and Keppra with LTM EEG.  Neurology felt seizure was most likely secondary to subdural hematoma and he was gradually improving until 1/17 when he was noted to have worsening mental status again with hypotension.  ABG 7.134 and pCO2 60.7, patient's mental status was not amenable for Bipap, so PCCM consulted for transfer.   Pertinent  Medical History   Past Medical History:  Diagnosis Date   Arthritis    Brain aneurysm    ETOH abuse    intoxicated   Lung cancer ([place])    Significant Hospital Events: Including procedures, antibiotic start and stop dates in addition to other pertinent events   1/11 admission for R hip fx 1/12 R hip arthoplasty 1/13 Sz activity 1/17 worsening mental status, PCCM consult and ICU txfr, intubated  1/18 CT head with stable small bilateral subdural hematomas but no acute intracranial abnormalities. 1/18 EEG >> abnormal comatose EEG, no epileptiform discharges 1/18 MRI brain >> stable bilateral frontal subacute subdural hematoma.  No new intracranial abnormalities. 1/19 CXR>> stable multilobar pneumonia, resolved RUL collapse, continued LLL consolidation/collapse 1/19 cEEG >>BIPDs with some brief electrographic seizures during the initial portion of the  recording, indicative of a multifocal epileptogenic disturbance. Seizures were improved in the afternoon and evening 1/21 worsening shock, acidosis.  Antibiotics changed to Vanco and meropenem.  Made DNR after family meeting 1/23 palliative met with family and seems that family wants full scope of offered tx, continue DNR   Interim History / Subjective:   K replaced I/O for urinary retention   Objective   Blood pressure 108/66, pulse 79, temperature (!) 97.5 F (36.4 C), temperature source Oral, resp. rate (!) 30, height '5\' 5"'  (1.651 m), weight 43.8 kg, SpO2 100 %.    Vent Mode: PSV;CPAP FiO2 (%):  [30 %-40 %] 30 % Set Rate:  [28 bmp] 28 bmp Vt Set:  [490 mL] 490 mL PEEP:  [5 cmH20] 5 cmH20 Pressure Support:  [10 cmH20] 10 cmH20 Plateau Pressure:  [18 cmH20-26 cmH20] 19 cmH20   Intake/Output Summary (Last 24 hours) at 07/21/2021 0837 Last data filed at 07/21/2021 0600 Gross per 24 hour  Intake 3211.38 ml  Output 275 ml  Net 2936.38 ml   Filed Weights   07/18/21 0616 07/20/21 0400 07/20/21 0500  Weight: 48.3 kg 43.8 kg 43.8 kg    Constitutional: Chronically and acutely ill appearing, cachectic elderly M intubated, off sedation  HEENT: Chronic R sided corneal clouding/abnormality. ETT secure   Neuro: Slight L sided withdrawal to painful stimuli. No cough, gag. Initiates respirations  CV: rrr s1s2 cap refill < 3 sec  Pulm: Mechanically ventilated  GI: Thin, ndnt + bowel sounds  GU: external urinary pouch  MSK: Anasarca superimposed on extremities with evident muscle wasting. Chronic toe deformities  Skin: c/d/w  Resolved Hospital Problem list   Hypernatremia Respiratory acidosis Septic shock  Assessment & Plan:   Acute metabolic encephalopathy Bilateral infarcts involving cerebellar, occipital, and parietal regions with L pontine involvement, with cytotoxic edema  Chronic SDH Hx seizures P -TEE  1/24 -- AC determination pending result -AEDs per neuro  -continue off  sedation   Acute hypoxic and hypercarbic respiratory failure PNA due to klebsiella pneumoniae and enterobacter cloacae  -ETT 1/17>>  P -mero end 1/25  -need to determine [place] with family re possible trach    AKI on CKD IIIa  -trend renal indices, UOP   Ileus, improving  - EN advancing   Severe protein calorie malnutrition FTT in adult Hypoglycemia  Metabolic acidosis related to GI losses, AKI on CKD  - Follow CBGs -continue on D5 with bicarb  -EN as above  Displaced R femoral neck fracture R acetabulum fx  R femoral head impaction fx -s/p R total hip arthroplasty 1/12  DNR status Goals of Care -1/22 by Dr. Vaughan Browner; no escalation, continue current level of care, DNR; not HD candiate -Seen by palliative 1/23 & family (step-daughter) indicates that code status is DNR but would accept all offered medical interventions  -prognosis for meaningful survival is poor. step-daughter plans to bring paperwork re HCPOA on 1/24-- cont Raymond talks when this is produced.   Best Practice (right click and "Reselect all SmartList Selections" daily)   Diet/type: NPO EN via cortrak DVT prophylaxis: lovenox GI prophylaxis: PPI Lines: N/A Foley:  N/A Code Status:  full code Last date of multidisciplinary goals of care discussion [1/23 --palliative care met with step-daughter]   CRITICAL CARE Performed by: Cristal Generous   Total critical care time: 37 minutes  Critical care time was exclusive of separately billable procedures and treating other patients. Critical care was necessary to treat or prevent imminent or life-threatening deterioration.  Critical care was time spent personally by me on the following activities: development of treatment plan with patient and/or surrogate as well as nursing, discussions with consultants, evaluation of patient's response to treatment, examination of patient, obtaining history from patient or surrogate, ordering and performing treatments and  interventions, ordering and review of laboratory studies, ordering and review of radiographic studies, pulse oximetry and re-evaluation of patient's condition.  Eliseo Gum MSN, AGACNP-BC [place] for pager  07/21/2021, 8:37 AM

## 2021-07-21 NOTE — Progress Notes (Signed)
eLink Physician-Brief Progress Note Patient Name: Gary Stevenson DOB: 08-18-1940 MRN: [id number]   Date of Service  07/21/2021  HPI/Events of Note  K+ 3.4, Cr 2.08, patient has 425 ml of urine in the bladder secondary to bladder outlet obstruction.  eICU Interventions  KCL 20 meq via OG x 1 ordered, patient had bladder decompressed via IN-OUT catheterization.        Kerry Kass Jaelin Devincentis 07/21/2021, 4:55 AM

## 2021-07-21 NOTE — Progress Notes (Addendum)
STROKE TEAM PROGRESS NOTE   ATTENDING NOTE: I reviewed above note and agree with the assessment and plan. Pt was seen and examined.   Pt RN, daughter are at the bedside. Pt still intubated and not responsive. On exam, pt in coma, not open eyes on voice or pain, bilateral pupils 2.61mm, reactive to light, pupils down to 1.8mm with light. No corneal, no doll's eyes, very weak gag reflexes present, not moving either extremities on pain stimulation.   TEE was not able to be performed due to swollen tongue per RN. Plan to reassess in am. If TEE not able to be performed, then ASA 81 is reasonable to add. Otherwise, would like to rule out endocarditis first before antiplatelet to avoid mycotic aneurysm rupturing. Pt does have a 78mm wide necked outpouching extending from the cavernous left ICA, suspicious for possible aneurysm, no previous image to compare, not sure about the acuity of the aneurysm, but less likely for mycotic aneurysm given isolated appearance.   OK with CCM to taper phenobarbital for mental status. Will check spot EEG in the next a couple of days to make sure pt not seizing. No need statin given low cholesterol level.   Again discussed with daughter about poor prognosis and consideration of comfort care. Palliative care involved.  For detailed assessment and plan, please refer to above as I have made changes wherever appropriate.   Rosalin Hawking, MD PhD Stroke Neurology 07/21/2021 4:48 PM  This patient is critically ill due to stroke, coma, respiratory failure, cerebral aneurysm, seizure and at significant risk of neurological worsening, death form brainstem stroke, respiratory failure, renal failure, aneurysm rupture, sepsis, septic shock. This patient's care requires constant monitoring of vital signs, hemodynamics, respiratory and cardiac monitoring, review of multiple databases, neurological assessment, discussion with family, other specialists and medical decision making of high  complexity. I spent 35 minutes of neurocritical care time in the care of this patient.     INTERVAL HISTORY No family at the bedside. Neurological function is poor. No response to voice or noxious stimuli. Eyes appear dysconjugate with forced opening. Family meeting for [place] discussion pending. Plan for TEE to rule out endocarditis as source of stroke then start DAPT therapy if appropriate. Lipid panel and Ha1C ordered with morning labs. Awaiting cardiology decision for TEE. Per family discussion yesterday, they are planning on providing [place] with a copy of the patients living will.   Vitals:   07/21/21 1059 07/21/21 1100 07/21/21 1200 07/21/21 1211  BP: 104/64 122/74 (!) 96/59   Pulse:  87 83   Resp:  17 16   Temp:    (!) 97.4 F (36.3 C)  TempSrc:    Oral  SpO2:  100% 99%   Weight:      Height:       CBC:  Recent Labs  Lab 07/20/21 0412 07/21/21 0322  WBC 9.9 11.6*  HGB 8.0* 8.5*  HCT 22.6* 25.0*  MCV 85.3 84.5  PLT 233 177    Basic Metabolic Panel:  Recent Labs  Lab 07/20/21 0412 07/21/21 0322  NA 137 137  K 4.0 3.4*  CL 112* 107  CO2 13* 19*  GLUCOSE 100* 128*  BUN 59* 59*  CREATININE 2.42* 2.08*  CALCIUM 7.6* 7.3*  MG 2.1 2.0  PHOS 3.9 3.0    Lipid Panel:  Recent Labs  Lab 07/21/21 0322  CHOL 58  TRIG 83  HDL <10*  CHOLHDL NOT CALCULATED  VLDL 17  LDLCALC NOT CALCULATED  HgbA1c: No results for input(s): HGBA1C in the last 168 hours. Urine Drug Screen: No results for input(s): LABOPIA, COCAINSCRNUR, LABBENZ, AMPHETMU, THCU, LABBARB in the last 168 hours.  Alcohol Level No results for input(s): ETH in the last 168 hours.  IMAGING past 24 hours No results found.  PHYSICAL EXAM  Physical Exam  Constitutional: Ill appearing african [place] male  Cardiovascular: NSR 60s, hypotensive- Systolic BP in the 21H during exam Respiratory: Intubated  Neuro: Patient is intubated with no sedation running. Does not respond to noxious stimuli, no  withdrawal to pain or spontaneous movement in any extremity. Eyes midline, no dolls eyes. Pupils 28mm and sluggish, no corneal reflex. No gag reflex or cough with endotracheal suctioning,    ASSESSMENT/PLAN Mr. Gary Stevenson is a 81 y.o. male with history of prior tobacco use, brain aneurysm, right frontal SDH s/p surgical evacuation in 2012, lung cancer s/p RML lobectomy 01/2009, ETOH abuse, and CKD who is admitted s/p fall 07/14/2021 for resultant right femoral neck fracutre and right acetabular fracture. Initially neurology was consulted for post-operative concern for seizure. A few hours after the procedure, the patient was seen to have high amplitude right upper extremity "shaking" bilaterally that lasted approximately 1 minute in duration as witnessed by nursing. The onset was not witnessed. He has been encephalopathic since after surgery, but was noted to be alert and oriented prior to procedure.  1/12 he underwent a R hip arthroplasty.  1/13 seizure activity was noted.  1/17- transferred to ICU and intubated. 1/18 MRI brain showed stable bilateral frontal subacute subdural hematoma.  1/19 cEEG- BIPDs with some brief electrographic seizures during the initial portion of the recording, indicative of a multifocal epileptogenic disturbance.  1/22- MRI - Extensive acute infarcts in the bilateral posterior circulation. Subtotal involvement of the left pons. Scattered bilateral cerebellar, occipital, and parietal lobe involvement. Cytotoxic edema without acute hemorrhage. Right > left mostly subfrontal SDH is stable since scan on 07/15/2021.  1/23- Breakthrough seizure activity, discuss with Dr. Hortense Ramal as far as titration of phenobarbital and keppra.   If family wishes to continue with stroke work up, plan for TEE in patient and for a loop recorder or 30 day monitor at discharge.   Stroke:  Scattered bilateral cerebellar, occipital, and parietal infarcts with left pontine involvement likely secondary to  cardio embolic source Code Stroke- Stable small bilateral subdural hematomas over the anterior and inferior aspects of the frontal lobes bilaterally not significantly changed from the prior exam. There is mild mass effect on the frontal lobe on the right with no midline shift. Atrophy with chronic microvascular ischemic changes. 1/22/20233- MRI  Extensive acute infarcts in the bilateral posterior circulation. Subtotal involvement of the left pons. Scattered bilateral cerebellar, occipital, and parietal lobe involvement. Cytotoxic edema without acute hemorrhage. Right > left mostly subfrontal SDH is stable since 07/15/2021.  MRA  No LVO, 5 mm wide necked outpouching extending from the cavernous left ICA, suspicious for possible aneurysm. 2D Echo 50-55% Lower extremity DVT LDL low but not calculated due to HDL < 10, but total cholesterol only 58. HgbA1c pending VTE prophylaxis - SCDs No antithrombotic prior to admission, now on No antithrombotic. Rule out endocarditis then start DAPT if appropriate. However, if TEE not able to be done, start ASA 81 is reasonable. Therapy recommendations:  Pending Disposition:  Pending  Hypertension Home meds:  None Stable Permissive hypertension (OK if < 220/120) but gradually normalize in 5-7 days Long-term BP goal normotensive  Hyperlipidemia Home meds:  None, resumed in hospital LDL No results found for requested labs within last 26280 hours., goal < 70 Continue statin at discharge  Other Stroke Risk Factors Advanced Age >/= 64  Cigarette smoker, advised to stop smoking ETOH use, alcohol level, 2 drink(s) a day Obesity, Body mass index is 16.07 kg/m., BMI >/= 30 associated with increased stroke risk, recommend weight loss, diet and exercise as appropriate  Hx stroke/TIA Chronic SDH- now noted to be stable on recent scans  Other Active Problems Respiratory Failure- Hypoxic hypercarbic Acute metabolic encephalopathy AKI on CKD Close displaced  fracture of the right femoral neck Status post right hip total arthroplasty on 1/12 by orthopedic surgery.   Severe protein caloric malnutrition Failure to thrive Hypoglycemia Acidemia related to CKD and GI losses D5 w/ bicarb Klebsiella, Enterobacter pneumonia Septic shock DC vanc, on Meropenem  Seizure History of seizure on keppra, recent seizure activity? Phenobarbital and levetiracetam   Hospital day # 13  Patient seen and examined by NP/APP with MD. MD to update note as needed.   Janine Ores, DNP, FNP-BC [facility] Pager: [phone]   To contact Stroke Continuity provider, please refer to [email or web address] After hours, contact General Neurology

## 2021-07-21 NOTE — Progress Notes (Addendum)
Addendum 1450  CCM brief family communication note  Spoke with pt HCPO / stepdaughter Gary Stevenson and mother at bedside. Discussed clinical updates and goals of care.  Gary Stevenson is clear that if offered, will accept trach / peg and feels that prognosticated outcome as discussed by multiple providers would be acceptable to pt.   Will discuss possible trach with PCCM MD  ___________  Attempted to reach Gary Stevenson for clinical updates -- no answer at number in chart    Eliseo Gum MSN, AGACNP-BC [place] 07/21/2021, 3:33 PM

## 2021-07-22 ENCOUNTER — Inpatient Hospital Stay (HOSPITAL_COMMUNITY): Payer: Medicare HMO

## 2021-07-22 DIAGNOSIS — R7881 Bacteremia: Secondary | ICD-10-CM

## 2021-07-22 DIAGNOSIS — I517 Cardiomegaly: Secondary | ICD-10-CM

## 2021-07-22 DIAGNOSIS — S72001A Fracture of unspecified part of neck of right femur, initial encounter for closed fracture: Secondary | ICD-10-CM | POA: Diagnosis not present

## 2021-07-22 DIAGNOSIS — I10 Essential (primary) hypertension: Secondary | ICD-10-CM

## 2021-07-22 LAB — BASIC METABOLIC PANEL
Anion gap: 9 (ref 5–15)
BUN: 61 mg/dL — ABNORMAL HIGH (ref 8–23)
CO2: 22 mmol/L (ref 22–32)
Calcium: 7.1 mg/dL — ABNORMAL LOW (ref 8.9–10.3)
Chloride: 107 mmol/L (ref 98–111)
Creatinine, Ser: 2.09 mg/dL — ABNORMAL HIGH (ref 0.61–1.24)
GFR, Estimated: 31 mL/min — ABNORMAL LOW (ref >60)
Glucose, Bld: 135 mg/dL — ABNORMAL HIGH (ref 70–99)
Potassium: 3.8 mmol/L (ref 3.5–5.1)
Sodium: 138 mmol/L (ref 135–145)

## 2021-07-22 LAB — GLUCOSE, CAPILLARY
Glucose-Capillary: 113 mg/dL — ABNORMAL HIGH (ref 70–99)
Glucose-Capillary: 104 mg/dL — ABNORMAL HIGH (ref 70–99)
Glucose-Capillary: 85 mg/dL (ref 70–99)
Glucose-Capillary: 76 mg/dL (ref 70–99)
Glucose-Capillary: 85 mg/dL (ref 70–99)
Glucose-Capillary: 88 mg/dL (ref 70–99)

## 2021-07-22 LAB — PHOSPHORUS: Phosphorus: 2.2 mg/dL — ABNORMAL LOW (ref 2.5–4.6)

## 2021-07-22 LAB — CBC
HCT: 23.1 % — ABNORMAL LOW (ref 39.0–52.0)
Hemoglobin: 8 g/dL — ABNORMAL LOW (ref 13.0–17.0)
MCH: 28.9 pg (ref 26.0–34.0)
MCHC: 34.6 g/dL (ref 30.0–36.0)
MCV: 83.4 fL (ref 80.0–100.0)
Platelets: 206 10*3/uL (ref 150–400)
RBC: 2.77 MIL/uL — ABNORMAL LOW (ref 4.22–5.81)
RDW: 17.2 % — ABNORMAL HIGH (ref 11.5–15.5)
WBC: 15.9 10*3/uL — ABNORMAL HIGH (ref 4.0–10.5)
nRBC: 1 % — ABNORMAL HIGH (ref 0.0–0.2)

## 2021-07-22 LAB — MAGNESIUM: Magnesium: 1.8 mg/dL (ref 1.7–2.4)

## 2021-07-22 IMAGING — DX DG CHEST 1V
1 series · 1 of 1 positions shown · non-contrast
Comparison: [DATE]

CLINICAL DATA: Pneumonia.

EXAM:
CHEST  1 VIEW

[chest]
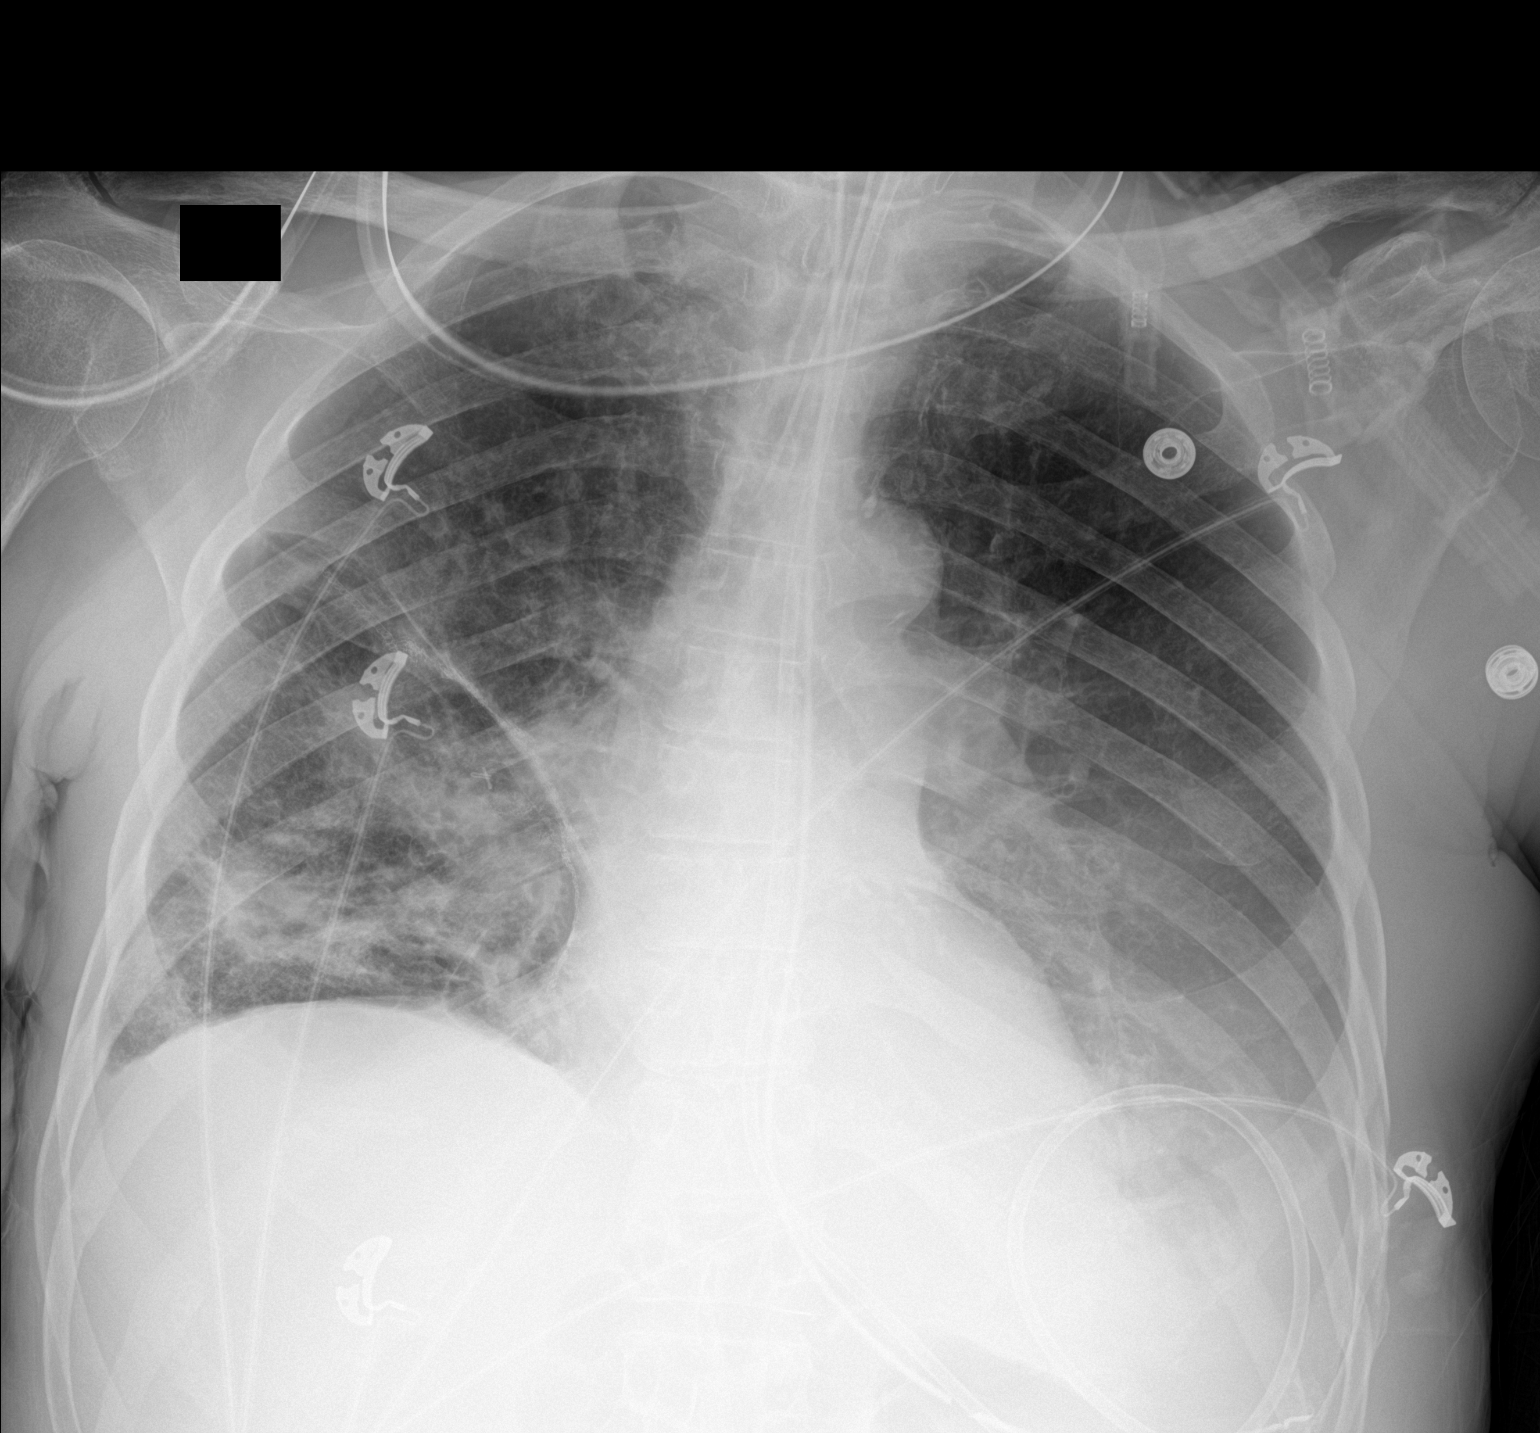

[1 of 1 positions shown; findings below may reference images not displayed]

FINDINGS: The endotracheal tube remains in good position, 3.6 cm above the
carina. The NG tube and feeding tube appears stable.

Stable surgical changes involving the right lung. Persistent
bilateral infiltrates. No pneumothorax.
IMPRESSION: 1. Stable support apparatus.
2. Persistent bilateral infiltrates.

## 2021-07-22 MED ORDER — PHENOBARBITAL 32.4 MG PO TABS: 16.2000 mg | ORAL_TABLET | Freq: Every day | ORAL | Status: DC

## 2021-07-22 MED ORDER — ASPIRIN 81 MG PO CHEW
81.0000 mg | CHEWABLE_TABLET | Freq: Every day | ORAL | Status: DC
  Administered 2021-07-22 – 2021-07-27 (×6): 81 mg
  Filled 2021-07-22 (×6): qty 1

## 2021-07-22 MED ORDER — MAGNESIUM SULFATE 2 GM/50ML IV SOLN
2.0000 g | Freq: Once | INTRAVENOUS | Status: AC
  Administered 2021-07-22: 04:00:00 2 g via INTRAVENOUS
  Filled 2021-07-22: qty 50

## 2021-07-22 MED ORDER — PHENOBARBITAL 32.4 MG PO TABS
16.2000 mg | ORAL_TABLET | Freq: Two times a day (BID) | ORAL | Status: AC
  Administered 2021-07-25 – 2021-07-27 (×6): 16.2 mg
  Filled 2021-07-22 (×6): qty 1

## 2021-07-22 MED ORDER — ENOXAPARIN SODIUM 30 MG/0.3ML IJ SOSY: 30.0000 mg | PREFILLED_SYRINGE | INTRAMUSCULAR | Status: DC

## 2021-07-22 MED ORDER — POTASSIUM CHLORIDE 20 MEQ PO PACK: 40.0000 meq | PACK | Freq: Once | ORAL | Status: DC

## 2021-07-22 MED ORDER — MIDAZOLAM HCL 2 MG/2ML IJ SOLN: 5.0000 mg | Freq: Once | INTRAMUSCULAR | Status: DC

## 2021-07-22 MED ORDER — ETOMIDATE 2 MG/ML IV SOLN: 40.0000 mg | Freq: Once | INTRAVENOUS | Status: DC

## 2021-07-22 MED ORDER — VECURONIUM BROMIDE 10 MG IV SOLR: 10.0000 mg | Freq: Once | INTRAVENOUS | Status: DC

## 2021-07-22 MED ORDER — FENTANYL CITRATE (PF) 100 MCG/2ML IJ SOLN: 200.0000 ug | Freq: Once | INTRAMUSCULAR | Status: DC

## 2021-07-22 MED ORDER — PHENOBARBITAL 32.4 MG PO TABS
32.4000 mg | ORAL_TABLET | Freq: Two times a day (BID) | ORAL | Status: AC
  Administered 2021-07-22 – 2021-07-24 (×6): 32.4 mg
  Filled 2021-07-22 (×5): qty 1

## 2021-07-22 MED ORDER — PHENOBARBITAL 32.4 MG PO TABS
32.4000 mg | ORAL_TABLET | Freq: Two times a day (BID) | ORAL | Status: DC
  Filled 2021-07-22: qty 1

## 2021-07-22 MED ORDER — POTASSIUM PHOSPHATES 15 MMOLE/5ML IV SOLN
15.0000 mmol | Freq: Once | INTRAVENOUS | Status: AC
  Administered 2021-07-22: 04:00:00 15 mmol via INTRAVENOUS
  Filled 2021-07-22: qty 5

## 2021-07-22 MED ORDER — PHENOBARBITAL 32.4 MG PO TABS: 16.2000 mg | ORAL_TABLET | Freq: Two times a day (BID) | ORAL | Status: DC

## 2021-07-22 NOTE — Progress Notes (Addendum)
STROKE TEAM PROGRESS NOTE   ATTENDING NOTE: I reviewed above note and agree with the assessment and plan. Pt was seen and examined.   No family at bedside.  Patient RN, CCM Dr. Tamala Julian RN at bedside.  Patient neuro unchanged, still unresponsive, intubated, however on weaning.  No doll's eyes, no corneals, bilateral pupillary reflex present, left stronger than right.  Very weak gag reflexes.  No movement in all extremities on pain stimulation.  TEE done this morning showed no endocarditis on MV and AV.  There is a filamentous mobile structure on the underside of the TV. Suspect chordal apparatus but cannot completely exclude vegetation.  However, no PFO or ASD.  This would not explain the stroke.  The aneurysm seen on MRI likely not mycotic aneurysm.  We will start aspirin 81 for stroke prevention.  No DAPT for now given cerebral aneurysm and potential upcoming procedure including trach and PEG.  Per Dr. Tamala Julian, palliative care services has talked with patient family and they would like aggressive care including PEG and trach and nursing home placement.  Started tapering off phenobarbital to see if improved patient mental status, will put on long-term EEG to rule out seizure while tapering off phenobarbital.  For detailed assessment and plan, please refer to above as I have made changes wherever appropriate.   Rosalin Hawking, MD PhD Stroke Neurology 07/22/2021 2:33 PM   This patient is critically ill due to posterior stroke, nonconvulsive status, respiratory failure, unresponsiveness, sepsis and at significant risk of neurological worsening, death form recurrent stroke, seizure, septic shock, renal failure, heart failure. This patient's care requires constant monitoring of vital signs, hemodynamics, respiratory and cardiac monitoring, review of multiple databases, neurological assessment, discussion with family, other specialists and medical decision making of high complexity. I spent 35 minutes of  neurocritical care time in the care of this patient.  I discussed with CCM Dr. Tamala Julian.     INTERVAL HISTORY Patient is seen in his room with his daughters at the bedside.  His neurological exam continues to be poor, and he has been hypotensive overnight.  Unable to perform TEE yesterday due to tongue swelling, will try again today.  Plan is to taper phenobarbital to see if neuro exam improves and spot check EEG to ensure that no seizures are present.    Vitals:   07/22/21 1019 07/22/21 1030 07/22/21 1045 07/22/21 1047  BP: 99/61 98/60 91/62  97/60  Pulse: 94 90 93 92  Resp: 20 17 18 18   Temp:      TempSrc:      SpO2: 100% 100% 100% 100%  Weight:      Height:       CBC:  Recent Labs  Lab 07/21/21 0322 07/21/21 2107 07/22/21 0051  WBC 11.6*  --  15.9*  HGB 8.5* 10.2* 8.0*  HCT 25.0* 30.0* 23.1*  MCV 84.5  --  83.4  PLT 222  --  235    Basic Metabolic Panel:  Recent Labs  Lab 07/21/21 0322 07/21/21 2107 07/22/21 0051  NA 137 140 138  K 3.4* 3.7 3.8  CL 107  --  107  CO2 19*  --  22  GLUCOSE 128*  --  135*  BUN 59*  --  61*  CREATININE 2.08*  --  2.09*  CALCIUM 7.3*  --  7.1*  MG 2.0  --  1.8  PHOS 3.0  --  2.2*    Lipid Panel:  Recent Labs  Lab 07/21/21 0322  CHOL 58  TRIG 83  HDL <10*  CHOLHDL NOT CALCULATED  VLDL 17  LDLCALC NOT CALCULATED    HgbA1c:  Recent Labs  Lab 07/21/21 0322  HGBA1C 5.7*   Urine Drug Screen: No results for input(s): LABOPIA, COCAINSCRNUR, LABBENZ, AMPHETMU, THCU, LABBARB in the last 168 hours.  Alcohol Level No results for input(s): ETH in the last 168 hours.  IMAGING past 24 hours DG Chest 1 View  Result Date: 07/22/2021 CLINICAL DATA:  Pneumonia. EXAM: CHEST  1 VIEW COMPARISON:  07/20/2021 FINDINGS: The endotracheal tube remains in good position, 3.6 cm above the carina. The NG tube and feeding tube appears stable. Stable surgical changes involving the right lung. Persistent bilateral infiltrates. No pneumothorax.  IMPRESSION: 1. Stable support apparatus. 2. Persistent bilateral infiltrates. Electronically Signed   By: Marijo Sanes M.D.   On: 07/22/2021 08:53    PHYSICAL EXAM  Physical Exam  Constitutional: Ill appearing african [place] male   Respiratory: Intubated, respirations synchronous with ventilator  Neuro: Patient is intubated with no sedation running. Does not respond to noxious stimuli, no movement to pain or spontaneous movement in any extremity. Eyes midline, no dolls eyes. Pupils 8mm and sluggish, no corneal reflex. No gag reflex or cough with endotracheal suctioning   ASSESSMENT/PLAN Mr. RONIN REHFELDT is a 81 y.o. male with history of prior tobacco use, brain aneurysm, right frontal SDH s/p surgical evacuation in 2012, lung cancer s/p RML lobectomy 01/2009, ETOH abuse, and CKD who is admitted s/p fall 07/03/2021 for resultant right femoral neck fracutre and right acetabular fracture. Initially neurology was consulted for post-operative concern for seizure. A few hours after the procedure, the patient was seen to have high amplitude right upper extremity "shaking" bilaterally that lasted approximately 1 minute in duration as witnessed by nursing. The onset was not witnessed. He has been encephalopathic since after surgery, but was noted to be alert and oriented prior to procedure.  1/12 he underwent a R hip arthroplasty.  1/13 seizure activity was noted.  1/17- transferred to ICU and intubated. 1/18 MRI brain showed stable bilateral frontal subacute subdural hematoma.  1/19 cEEG- BIPDs with some brief electrographic seizures during the initial portion of the recording, indicative of a multifocal epileptogenic disturbance.  1/22- MRI - Extensive acute infarcts in the bilateral posterior circulation. Subtotal involvement of the left pons. Scattered bilateral cerebellar, occipital, and parietal lobe involvement. Cytotoxic edema without acute hemorrhage. Right > left mostly subfrontal SDH is  stable since scan on 07/15/2021.  1/23- Breakthrough seizure activity, discuss with Dr. Hortense Ramal as far as titration of phenobarbital and keppra.  1/24 Unable to perform TEE due to tongue swelling.  Will reassess 1/25  If family wishes to continue with stroke work up, plan for TEE in patient and for a loop recorder or 30 day monitor at discharge.   Stroke:  Scattered bilateral cerebellar, occipital, and parietal infarcts with left pontine involvement likely secondary to cardio embolic source Code Stroke- Stable small bilateral subdural hematomas over the anterior and inferior aspects of the frontal lobes bilaterally not significantly changed from the prior exam. There is mild mass effect on the frontal lobe on the right with no midline shift. Atrophy with chronic microvascular ischemic changes. 1/22/20233- MRI  Extensive acute infarcts in the bilateral posterior circulation. Subtotal involvement of the left pons. Scattered bilateral cerebellar, occipital, and parietal lobe involvement. Cytotoxic edema without acute hemorrhage. Right > left mostly subfrontal SDH is stable since 07/15/2021.  MRA  No LVO, 5 mm wide necked  outpouching extending from the cavernous left ICA, suspicious for possible aneurysm. 2D Echo 50-55% Lower extremity DVT LDL low but not calculated due to HDL < 10, but total cholesterol only 58. HgbA1c pending VTE prophylaxis - SCDs No antithrombotic prior to admission, now on No antithrombotic. Rule out endocarditis then start DAPT if appropriate. However, if TEE not able to be done, start ASA 81 is reasonable. Therapy recommendations:  Pending Disposition:  Pending  Hypertension Home meds:  None Stable Permissive hypertension (OK if < 220/120) but gradually normalize in 5-7 days Long-term BP goal normotensive  Hyperlipidemia Home meds:  None, resumed in hospital LDL No results found for requested labs within last 26280 hours., goal < 70 Continue statin at discharge  Other  Stroke Risk Factors Advanced Age >/= 61  Cigarette smoker, advised to stop smoking ETOH use, alcohol level, 2 drink(s) a day Hx stroke/TIA Chronic SDH- now noted to be stable on recent scans  Other Active Problems Respiratory Failure- Hypoxic hypercarbic Acute metabolic encephalopathy AKI on CKD Close displaced fracture of the right femoral neck Status post right hip total arthroplasty on 1/12 by orthopedic surgery.   Severe protein caloric malnutrition Failure to thrive Hypoglycemia Acidemia related to CKD and GI losses D5 w/ bicarb Klebsiella, Enterobacter pneumonia Septic shock DC vanc, on Meropenem  Seizure History of seizure on keppra, recent seizure activity? Phenobarbital and levetiracetam  Taper phenobarbital to see if neuro exam improves  Hospital day # 14  Patient seen and examined by NP/APP with MD. MD to update note as needed.   [place] , MSN, AGACNP-BC [facility] See Amion for schedule and pager information 07/22/2021 11:41 AM     To contact Stroke Continuity provider, please refer to [email or web address] After hours, contact General Neurology

## 2021-07-22 NOTE — TOC Progression Note (Signed)
Transition of Care [facility]) - Progression Note    Patient Details  Name: Gary Stevenson MRN: [id number] Date of Birth: 02-24-1941  [facility]) CM/SW Contact  Joanne Chars, LCSW Phone Number: 07/22/2021, 3:03 PM  Clinical Narrative:   [facility] reviewed pt and reports he is not a candidate for their program.    Expected Discharge Plan: Long Term Acute Care (LTAC) Barriers to Discharge: Continued Medical Work up  Expected Discharge Plan and Services Expected Discharge Plan: Long Term Acute Care (LTAC) In-house Referral: Clinical Social Work Discharge Planning Services: CM Consult Post Acute Care Choice: NA Living arrangements for the past 2 months: Apartment                 DME Arranged: N/A DME Agency: NA       HH Arranged: NA [place] Agency: NA         Social Determinants of Health (SDOH) Interventions    Readmission Risk Interventions Readmission Risk Prevention Plan 07/22/2021  Transportation Screening Complete  PCP or Specialist Appt within 3-5 Days Complete  HRI or [place] Not Complete  HRI or Home Care Consult comments patient currently not medically stable  Social Work Consult for [place] Planning/Counseling Complete  Palliative Care Screening Complete  Medication Review [employer]) Referral to Pharmacy  Some recent data might be hidden

## 2021-07-22 NOTE — Progress Notes (Signed)
NAME:  Gary Stevenson, MRN:  [id number], DOB:  03-04-1941, LOS: 52 ADMISSION DATE:  07/19/2021, CONSULTATION DATE: 07/14/2021 REFERRING MD:  Marlyce Huge, CHIEF COMPLAINT:  AMS, Respiratory failure   History of Present Illness:  Gary Stevenson is a 81 y.o. M with PMH significant for right frontal subdural hematomas status post surgical evacuation in 2012, lung cancer status post lobectomy, alcohol abuse, CKD who presented to the ED 1/12 after a fall and was found to have R acetabular fx with interval displacement and underwent R total hip arthroplasty on 07/14/2021.  He was later found with decreased responsiveness and seizure activity that lasted for around 1 minute.  He was seen by Neurology and started on Phenobarbital and Keppra with LTM EEG.  Neurology felt seizure was most likely secondary to subdural hematoma and he was gradually improving until 1/17 when he was noted to have worsening mental status again with hypotension.  ABG 7.134 and pCO2 60.7, patient's mental status was not amenable for Bipap, so PCCM consulted for transfer.   Pertinent  Medical History   Past Medical History:  Diagnosis Date   Arthritis    Brain aneurysm    ETOH abuse    intoxicated   Lung cancer ([place])    Significant Hospital Events: Including procedures, antibiotic start and stop dates in addition to other pertinent events   1/11 admission for R hip fx 1/12 R hip arthoplasty 1/13 Sz activity 1/17 worsening mental status, PCCM consult and ICU txfr, intubated  1/18 CT head with stable small bilateral subdural hematomas but no acute intracranial abnormalities. 1/18 EEG >> abnormal comatose EEG, no epileptiform discharges 1/18 MRI brain >> stable bilateral frontal subacute subdural hematoma.  No new intracranial abnormalities. 1/19 CXR>> stable multilobar pneumonia, resolved RUL collapse, continued LLL consolidation/collapse 1/19 cEEG >>BIPDs with some brief electrographic seizures during the initial portion of the  recording, indicative of a multifocal epileptogenic disturbance. Seizures were improved in the afternoon and evening 1/21 worsening shock, acidosis.  Antibiotics changed to Vanco and meropenem.  Made DNR after family meeting 1/23 palliative met with family and seems that family wants full scope of offered tx, continue DNR   Interim History / Subjective:  Mild hypotension overnight requiring brief pressors. Remains poorly responsive.  Objective   Blood pressure (!) 103/58, pulse 92, temperature 98 F (36.7 C), temperature source Oral, resp. rate (!) 25, height '5\' 5"'  (1.651 m), weight 43.8 kg, SpO2 100 %.    Vent Mode: PRVC FiO2 (%):  [30 %-50 %] 40 % Set Rate:  [28 bmp] 28 bmp Vt Set:  [490 mL] 490 mL PEEP:  [5 cmH20] 5 cmH20 Pressure Support:  [10 cmH20] 10 cmH20 Plateau Pressure:  [16 cmH20-21 cmH20] 21 cmH20   Intake/Output Summary (Last 24 hours) at 07/22/2021 3300 Last data filed at 07/22/2021 0630 Gross per 24 hour  Intake 4275.13 ml  Output 965 ml  Net 3310.13 ml    Filed Weights   07/20/21 0400 07/20/21 0500  Weight: 43.8 kg 43.8 kg    Chronically ill man in NAD Temporal wasting, +JVD Tongue swelling stable Pupils small, sluggish, corneal clouding on R chronic Roving R eye, left eye stays midline No corneals No response to painful stimuli, no cough/gag Cannot get a motor response Diffuse anasarca stable  K low repleting  Resolved Hospital Problem list   Hypernatremia Respiratory acidosis Septic shock  Assessment & Plan:   Acute metabolic encephalopathy Bilateral infarcts involving cerebellar, occipital, and parietal regions with L pontine involvement,  with cytotoxic edema  Chronic SDH L ICA aneurysm r/o mycotic Hx seizures P - TEE when cleared by cardiology r/o IE which would preclude DAPT, atrial/ventricular clot which would require AC - GDMT, AEDs per neurology - Will order baby aspirin for now  Acute hypoxic and hypercarbic respiratory failure-ETT  1/17 PNA due to klebsiella pneumoniae and enterobacter cloacae  Worsening white count, secretions 1/25 P -mero end 1/25  -recheck CXR, tracheal aspirate given rising white count and worsening secretions -will have TOC speak with family regarding what disposition would look like should we pursue trach/PEG; if they are okay with this we will proceed  AKI on CKD IIIa  -trend renal indices, UOP   Ileus, resolved  Severe protein calorie malnutrition FTT in adult Hypoglycemia  Metabolic acidosis related to GI losses, AKI on CKD - improved Volume overload/third spacing - Follow CBGs - TF at goal, remove OGT - DC bicarb gtt, watch sugars with TF at goal  Displaced R femoral neck fracture R acetabulum fx  R femoral head impaction fx -s/p R total hip arthroplasty 1/12  DNR status Goals of Care Continues daily Family seems to want to pursue LTACH Once TEE, Trach/PEG completed can work toward this Before we commit to trach, will have TOC discuss disposition with family  Best Practice (right click and "Reselect all SmartList Selections" daily)   Diet/type: NPO EN via cortrak DVT prophylaxis: lovenox GI prophylaxis: PPI Lines: N/A Foley:  N/A Code Status:  full code Last date of multidisciplinary goals of care discussion [1/24 by Shirlee Limerick Bowser]   Patient critically ill due to respiratory failure, encephalopathy Interventions to address this today family discussions, vent titration Risk of deterioration without these interventions is high  I personally spent 33 minutes providing critical care not including any separately billable procedures  Erskine Emery MD [facility]  Prefer epic messenger for cross cover needs If after hours, please call E-link

## 2021-07-22 NOTE — Progress Notes (Signed)
EEG LTM HOOK UP. [facility] notified. Impedes under 10 okm.

## 2021-07-22 NOTE — TOC Initial Note (Signed)
Transition of Care [facility]) - Initial/Assessment Note    Patient Details  Name: Gary Stevenson MRN: [id number] Date of Birth: 08-Oct-1940  Transition of Care [facility]) CM/SW Contact:    Angelita Ingles, RN Phone Number:[phone]  07/22/2021, 1:11 PM  Clinical Narrative:                 [facility] consulted for patient with high risk for readmission. Patient is currently unresponsive and unable to participate in assessment. CM spoke with daughter Gary Stevenson via the telephone. Per daughter patient is from home alone where he functioned independently with no DME or home health. Daughter states that she assisted him with transportation and delivered food to him otherwise patient had no other needs. TOC will follow for disposition planning.   Expected Discharge Plan: Long Term Acute Care (LTAC) Barriers to Discharge: Continued Medical Work up   Patient Goals and CMS Choice Patient states their goals for this hospitalization and ongoing recovery are:: Patient currently unresponsive questions answered per daughter Gary Stevenson   Choice offered to / list presented to : NA  Expected Discharge Plan and Services Expected Discharge Plan: Long Term Acute Care (LTAC) In-house Referral: Clinical Social Work Discharge Planning Services: CM Consult Post Acute Care Choice: NA Living arrangements for the past 2 months: Apartment                 DME Arranged: N/A DME Agency: NA       HH Arranged: NA [place] Agency: NA        Prior Living Arrangements/Services Living arrangements for the past 2 months: Apartment Lives with:: Self Patient language and need for interpreter reviewed:: Yes Do you feel safe going back to the place where you live?:  (Patient unable to respond)      Need for Family Participation in Patient Care: Yes (Comment) Care giver support system in place?: Yes (comment) Current home services:  (none) Criminal Activity/Legal Involvement Pertinent to Current Situation/Hospitalization:  No - Comment as needed  Activities of Daily Living Home Assistive Devices/Equipment: Eyeglasses ADL Screening (condition at time of admission) Patient's cognitive ability adequate to safely complete daily activities?: No Is the patient deaf or have difficulty hearing?: Yes ([place]) Does the patient have difficulty seeing, even when wearing glasses/contacts?: No Does the patient have difficulty concentrating, remembering, or making decisions?: Yes Patient able to express need for assistance with ADLs?: Yes Does the patient have difficulty dressing or bathing?: Yes Independently performs ADLs?: No Communication: Independent Dressing (OT): Needs assistance Is this a change from baseline?: Pre-admission baseline Grooming: Needs assistance Is this a change from baseline?: Pre-admission baseline Feeding: Independent Bathing: Needs assistance Is this a change from baseline?: Pre-admission baseline Toileting: Needs assistance Is this a change from baseline?: Pre-admission baseline In/Out Bed: Needs assistance Is this a change from baseline?: Pre-admission baseline Walks in Home: Independent Does the patient have difficulty walking or climbing stairs?: Yes Weakness of Legs: Both Weakness of Arms/Hands: None  Permission Sought/Granted   Permission granted to share information with : No              Emotional Assessment Appearance:: Appears stated age Attitude/Demeanor/Rapport: Unresponsive Affect (typically observed): Unable to Assess Orientation: :  (unable to assess) Alcohol / Substance Use: Not Applicable Psych Involvement: No (comment)  Admission diagnosis:  Displaced fracture of right femoral neck ([place]) [S72.001A] Patient Active Problem List   Diagnosis Date Noted   Cerebral embolism with cerebral infarction 07/20/2021   Hypotension    Acute  respiratory failure with hypoxia and hypercapnia (HCC)    Screening due    Septic shock (HCC)    Pressure injury of skin 07/11/2021    Protein-calorie malnutrition, severe 07/01/2021   Acute on chronic blood loss anemia 07/22/2021   Closed displaced fracture of right femoral neck ([place]) 07/11/2021   Displaced fracture of right femoral neck ([place]) 06/30/2021   Choroidal hemorrhage of right eye 11/02/2015   Lesion of eye 11/02/2015   S/P PKP (penetrating keratoplasty) 11/02/2015   Anemia 03/17/2014   Alcohol abuse 03/17/2014   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   Essential hypertension 03/17/2014   Anemia due to chronic blood loss 03/17/2014   Heme + stool 03/17/2014   Normocytic anemia 03/17/2014   COPD UNSPECIFIED 01/03/2009   DYSPNEA 01/03/2009   SWELLING/MASS/LUMP IN CHEST 01/03/2009   PCP:  Seward Carol, MD Pharmacy:   [id number] - [place], [address] [place] [address] Phone: [phone] Fax: [phone]     Social Determinants of Health (SDOH) Interventions    Readmission Risk Interventions Readmission Risk Prevention Plan 07/22/2021  Transportation Screening Complete  PCP or Specialist Appt within 3-5 Days Complete  HRI or [place] Not Complete  HRI or Home Care Consult comments patient currently not medically stable  Social Work Consult for [place] Planning/Counseling Complete  Palliative Care Screening Complete  Medication Review Press photographer) Referral to Pharmacy  Some recent data might be hidden

## 2021-07-22 NOTE — CV Procedure (Signed)
° ° °  TRANSESOPHAGEAL ECHOCARDIOGRAM   NAME:  KAYMEN ADRIAN   MRN: [id number] DOB:  1940/12/13   ADMIT DATE: 07/21/2021  INDICATIONS:  Stroke  PROCEDURE:   Informed consent was obtained prior to the procedure.   The patient was sedated on the vent. The transesophageal probe was inserted in the esophagus and stomach without difficulty and multiple views were obtained.    COMPLICATIONS:    There were no immediate complications.  FINDINGS:  LEFT VENTRICLE: EF = 55%. No regional wall motion abnormalities.  RIGHT VENTRICLE: Normal size and function.   LEFT ATRIUM: Moderately dilated  LEFT ATRIAL APPENDAGE: No thrombus.   RIGHT ATRIUM: Moderately dilated  AORTIC VALVE:  [place].  Trivial AI. No vegetation.   MITRAL VALVE:    Normal. Trivial MR. No vegetation   TRICUSPID VALVE: Normal. Trivial TR. There was a filamentous mobile structure on the underside of the TV. Suspect chordal apparatus but cannot completely exclude vegetation   PULMONIC VALVE: Normal. Trivial PI.   INTERATRIAL SEPTUM: No PFO or ASD.  PERICARDIUM: No effusion  DESCENDING AORTA: Moderate plaque   Benay Spice 12:48 PM

## 2021-07-23 ENCOUNTER — Inpatient Hospital Stay (HOSPITAL_COMMUNITY): Payer: Medicare HMO

## 2021-07-23 DIAGNOSIS — S72001A Fracture of unspecified part of neck of right femur, initial encounter for closed fracture: Secondary | ICD-10-CM | POA: Diagnosis not present

## 2021-07-23 DIAGNOSIS — J9601 Acute respiratory failure with hypoxia: Secondary | ICD-10-CM | POA: Diagnosis not present

## 2021-07-23 LAB — BASIC METABOLIC PANEL
Anion gap: 9 (ref 5–15)
Anion gap: 9 (ref 5–15)
BUN: 66 mg/dL — ABNORMAL HIGH (ref 8–23)
BUN: 67 mg/dL — ABNORMAL HIGH (ref 8–23)
CO2: 23 mmol/L (ref 22–32)
CO2: 22 mmol/L (ref 22–32)
Calcium: 7.2 mg/dL — ABNORMAL LOW (ref 8.9–10.3)
Calcium: 7.2 mg/dL — ABNORMAL LOW (ref 8.9–10.3)
Chloride: 105 mmol/L (ref 98–111)
Chloride: 107 mmol/L (ref 98–111)
Creatinine, Ser: 1.91 mg/dL — ABNORMAL HIGH (ref 0.61–1.24)
Creatinine, Ser: 2.16 mg/dL — ABNORMAL HIGH (ref 0.61–1.24)
GFR, Estimated: 35 mL/min — ABNORMAL LOW (ref >60)
GFR, Estimated: 30 mL/min — ABNORMAL LOW (ref >60)
Glucose, Bld: 88 mg/dL (ref 70–99)
Glucose, Bld: 103 mg/dL — ABNORMAL HIGH (ref 70–99)
Potassium: 3.8 mmol/L (ref 3.5–5.1)
Potassium: 4.3 mmol/L (ref 3.5–5.1)
Sodium: 137 mmol/L (ref 135–145)
Sodium: 138 mmol/L (ref 135–145)

## 2021-07-23 LAB — CBC
HCT: 23 % — ABNORMAL LOW (ref 39.0–52.0)
Hemoglobin: 8.2 g/dL — ABNORMAL LOW (ref 13.0–17.0)
MCH: 29.6 pg (ref 26.0–34.0)
MCHC: 35.7 g/dL (ref 30.0–36.0)
MCV: 83 fL (ref 80.0–100.0)
Platelets: 183 10*3/uL (ref 150–400)
RBC: 2.77 MIL/uL — ABNORMAL LOW (ref 4.22–5.81)
RDW: 17.2 % — ABNORMAL HIGH (ref 11.5–15.5)
WBC: 15.3 10*3/uL — ABNORMAL HIGH (ref 4.0–10.5)
nRBC: 0.5 % — ABNORMAL HIGH (ref 0.0–0.2)

## 2021-07-23 LAB — GLUCOSE, CAPILLARY
Glucose-Capillary: 106 mg/dL — ABNORMAL HIGH (ref 70–99)
Glucose-Capillary: 108 mg/dL — ABNORMAL HIGH (ref 70–99)
Glucose-Capillary: 72 mg/dL (ref 70–99)
Glucose-Capillary: 78 mg/dL (ref 70–99)
Glucose-Capillary: 87 mg/dL (ref 70–99)
Glucose-Capillary: 88 mg/dL (ref 70–99)
Glucose-Capillary: 96 mg/dL (ref 70–99)

## 2021-07-23 LAB — LDL CHOLESTEROL, DIRECT: Direct LDL: 15.6 mg/dL (ref 0–99)

## 2021-07-23 LAB — PHOSPHORUS: Phosphorus: 3.1 mg/dL (ref 2.5–4.6)

## 2021-07-23 LAB — TSH: TSH: 5.942 u[IU]/mL — ABNORMAL HIGH (ref 0.350–4.500)

## 2021-07-23 LAB — MAGNESIUM
Magnesium: 2.2 mg/dL (ref 1.7–2.4)
Magnesium: 2 mg/dL (ref 1.7–2.4)

## 2021-07-23 IMAGING — DX DG CHEST 1V PORT
1 series · 1 of 1 positions shown · non-contrast
Comparison: [DATE]

CLINICAL DATA: Tracheostomy placement

EXAM:
PORTABLE CHEST 1 VIEW

[chest]
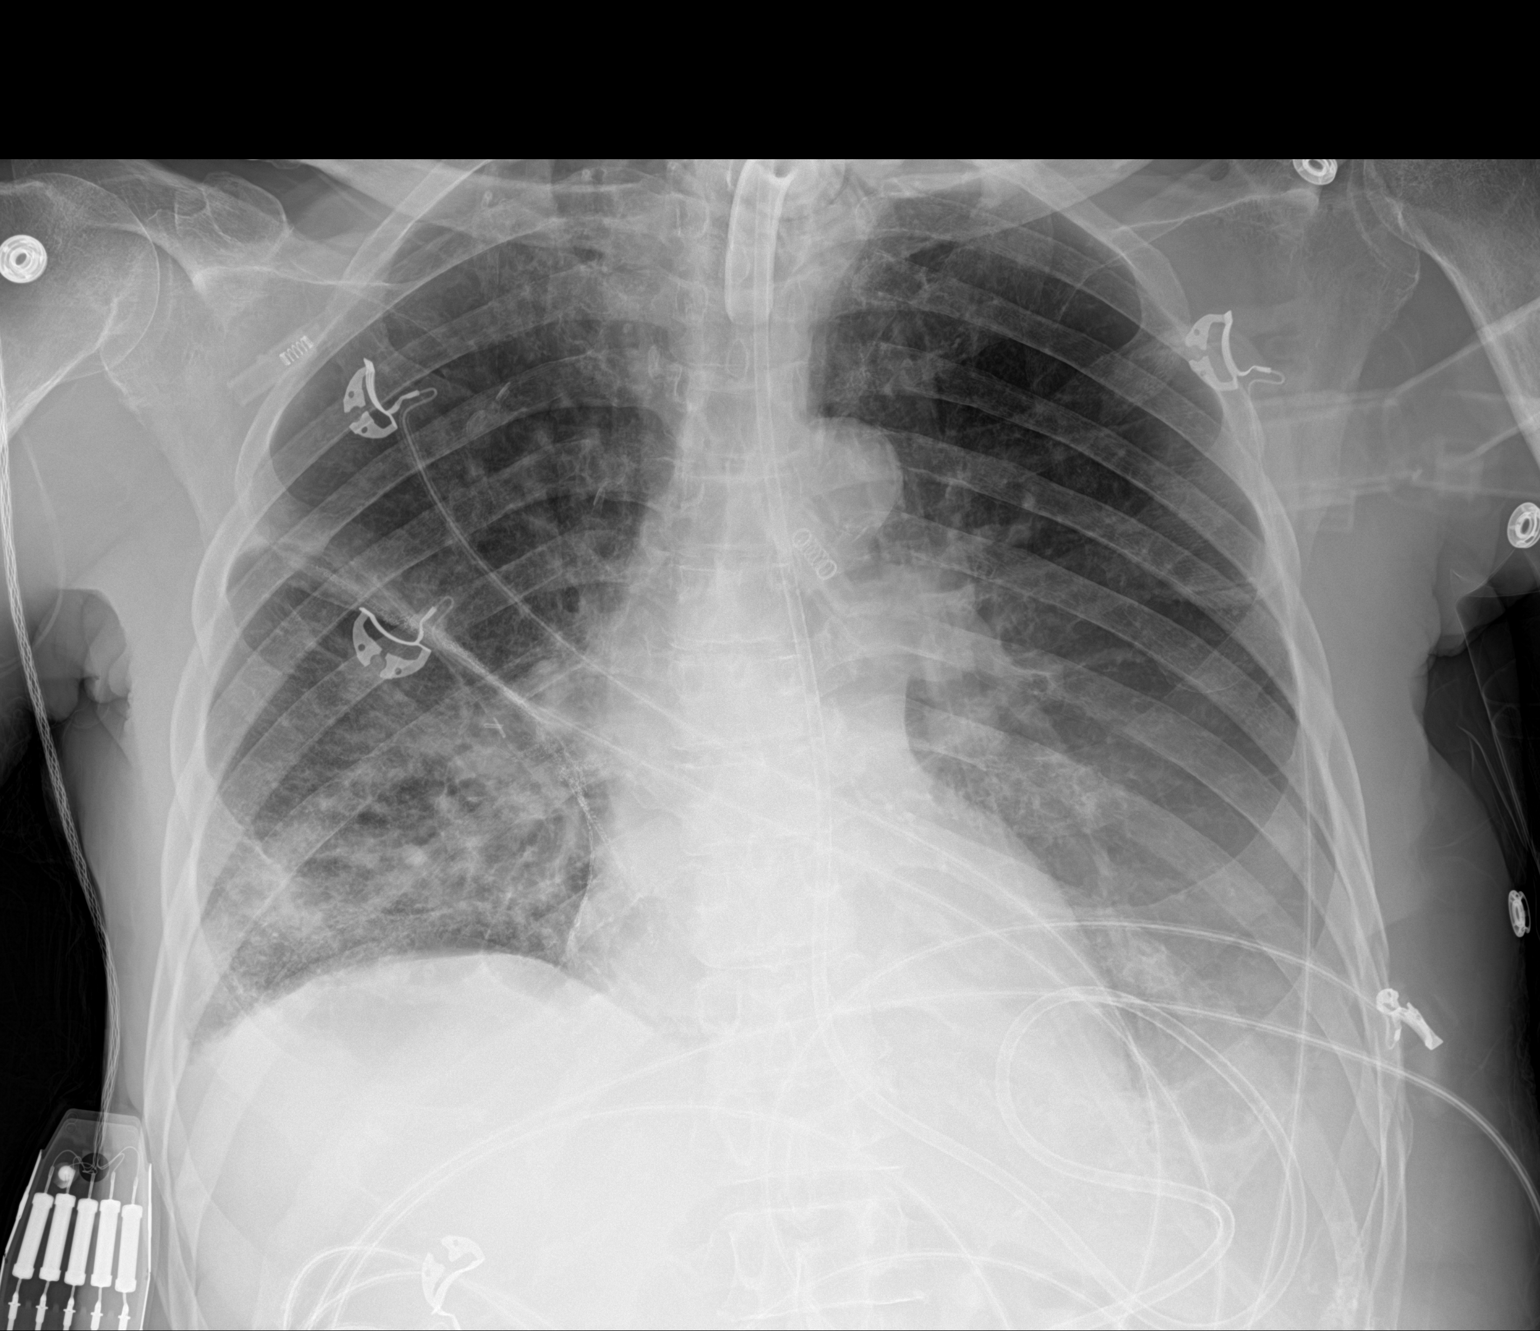

[1 of 1 positions shown; findings below may reference images not displayed]

FINDINGS: Interval removal of ET tube and placement of tracheostomy tube which
appears appropriately position. Previously seen small bore enteric
tube has been removed. Large bore feeding tube remains in place with
distal tip extending beyond the inferior margin of the film.

Normal heart size. Persistent patchy bibasilar opacities, right
worse than left. Small bilateral pleural effusions, left greater
than right. No pneumothorax.
IMPRESSION: 1. Interval placement of tracheostomy tube.
2. Persistent patchy bibasilar opacities and small bilateral pleural
effusions.

## 2021-07-23 IMAGING — CT CT ABDOMEN W/O CM
2 of 4 series · 16 of 46 positions shown, 18 images · non-contrast
Comparison: PET-CT [DATE]

CLINICAL DATA: Evaluate anatomy for potential G-tube.

EXAM:
CT ABDOMEN WITHOUT CONTRAST
TECHNIQUE: Multidetector CT imaging of the abdomen was performed following the
standard protocol without IV contrast.
RADIATION DOSE REDUCTION: This exam was performed according to the
departmental dose-optimization program which includes automated
exposure control, adjustment of the mA and/or kV according to
patient size and/or use of iterative reconstruction technique.

[Series 3: a/p w/o 5mm · axial · non-contrast · 0.76mm/px · z∈[+1067,+1282]mm · 13 of 49 slices shown, 15 images]
[im 3/49  soft-tissue]
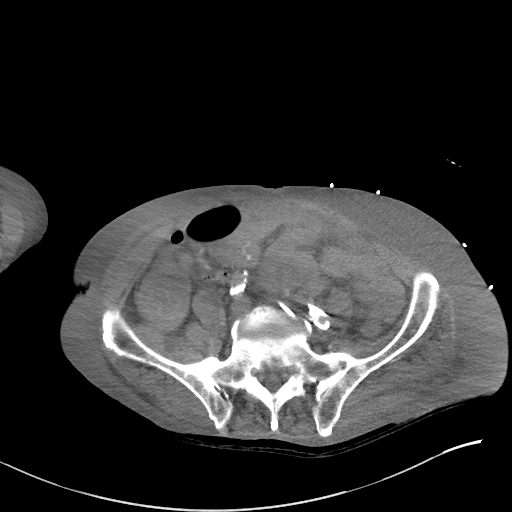
[im 3/49  bone]
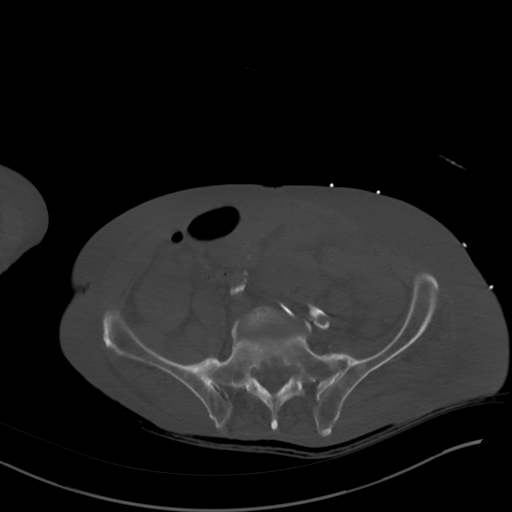
[im 7/49  soft-tissue]
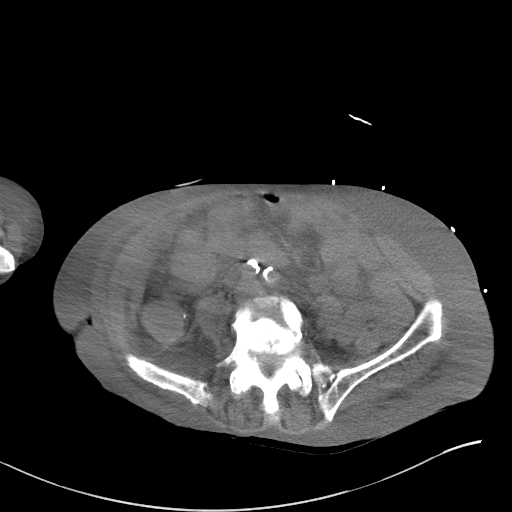
[im 11/49  soft-tissue]
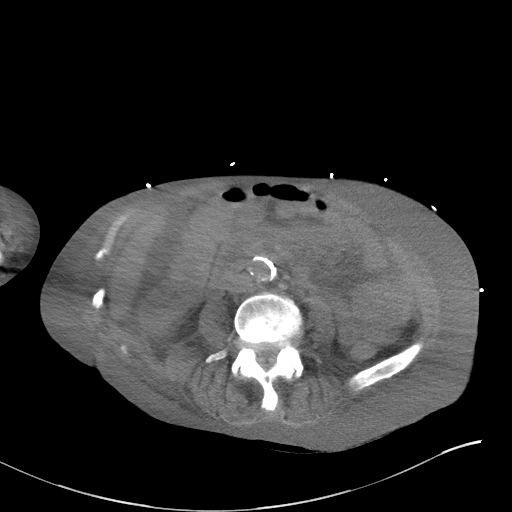
[im 14/49  soft-tissue]
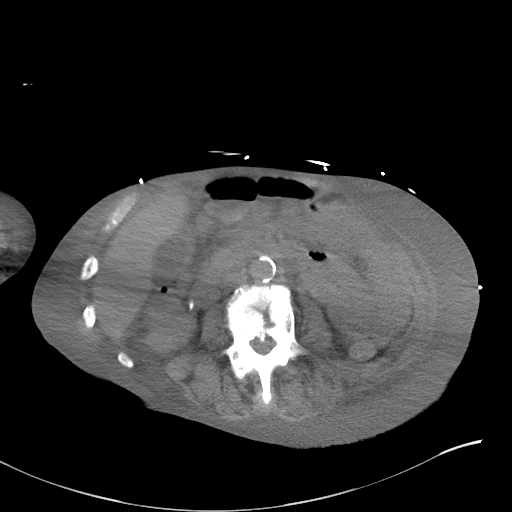
[im 18/49  soft-tissue]
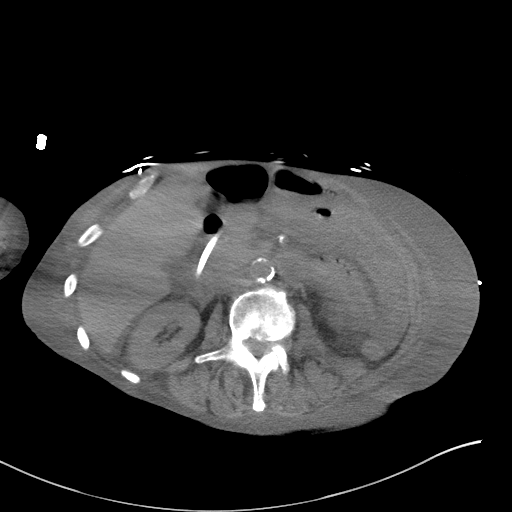
[im 20/49  soft-tissue]
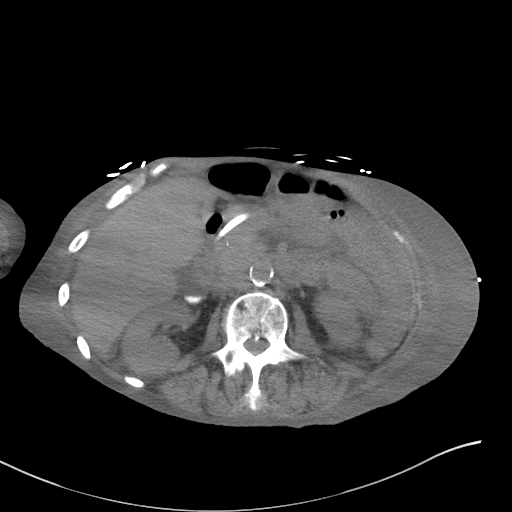
[im 25/49  soft-tissue]
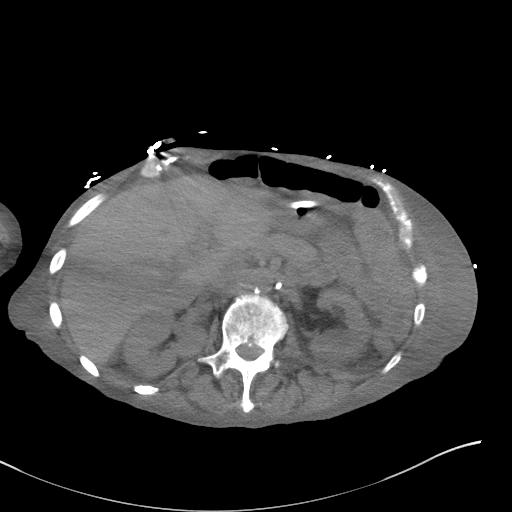
[im 29/49  soft-tissue]
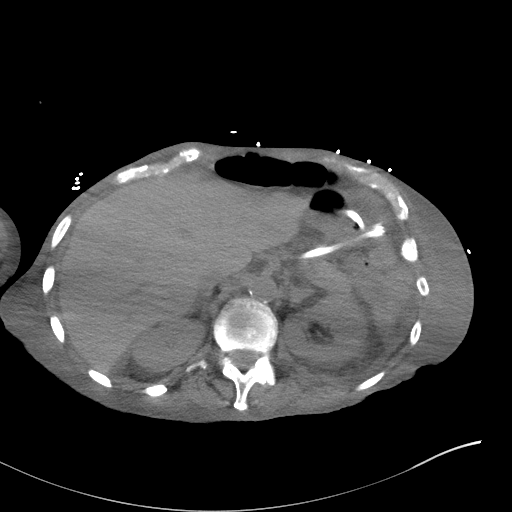
[im 31/49  soft-tissue]
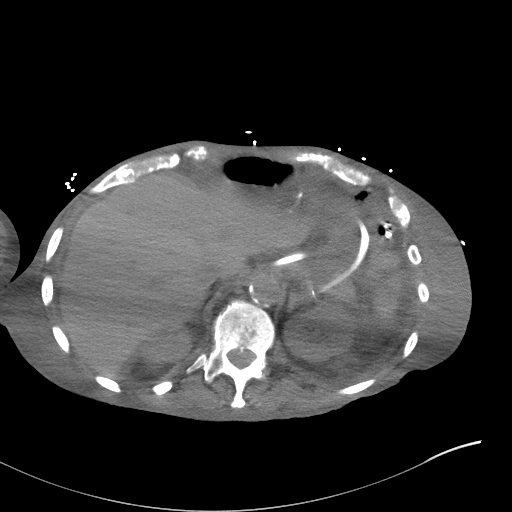
[im 31/49  bone]
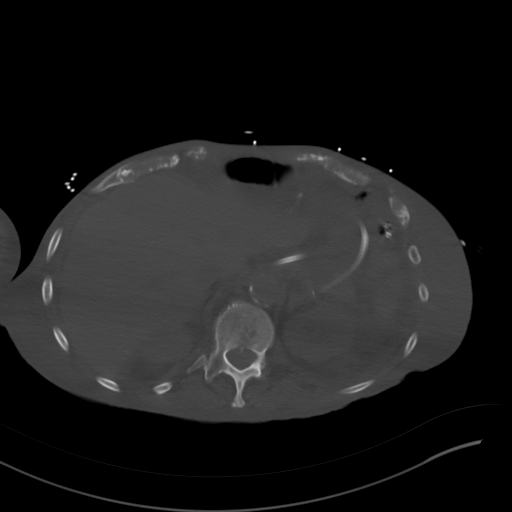
[im 35/49  soft-tissue]
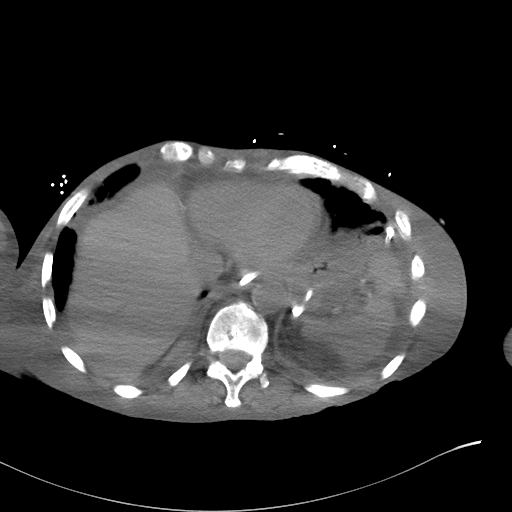
[im 38/49  soft-tissue]
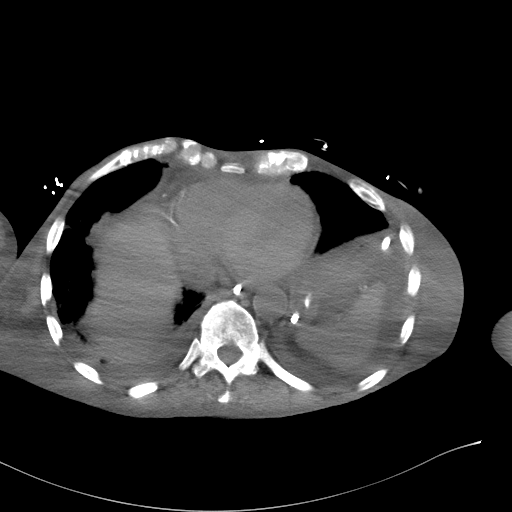
[im 42/49  soft-tissue]
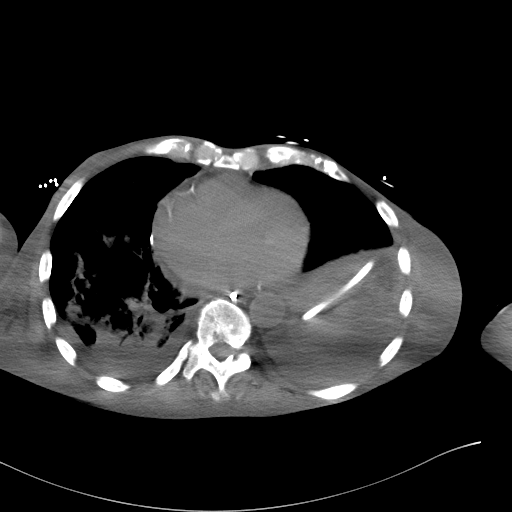
[im 46/49  soft-tissue]
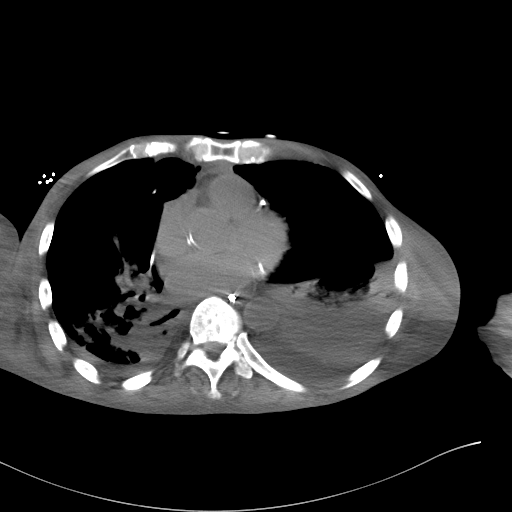

[Series 6: a/p w/o cor · coronal · non-contrast · 0.59mm/px · 3 of 111 slices shown]
[im 37/111  soft-tissue]
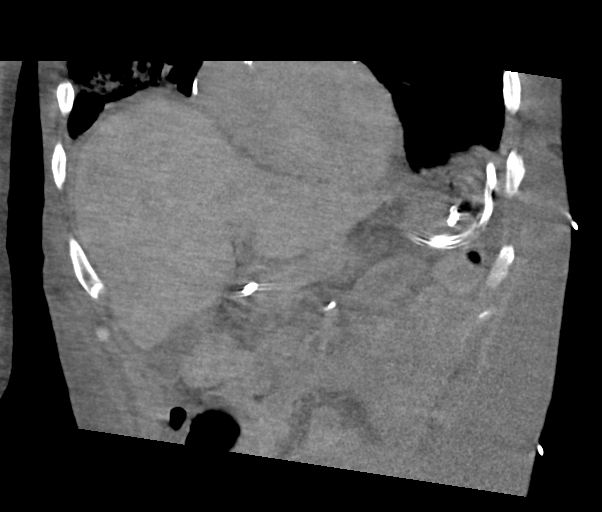
[im 49/111  soft-tissue]
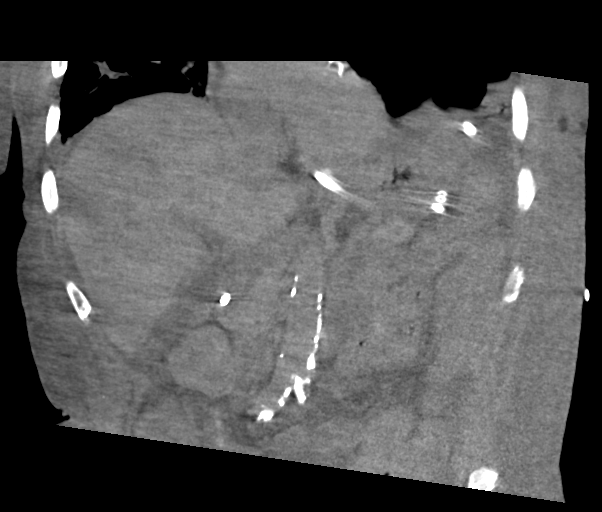
[im 62/111  soft-tissue]
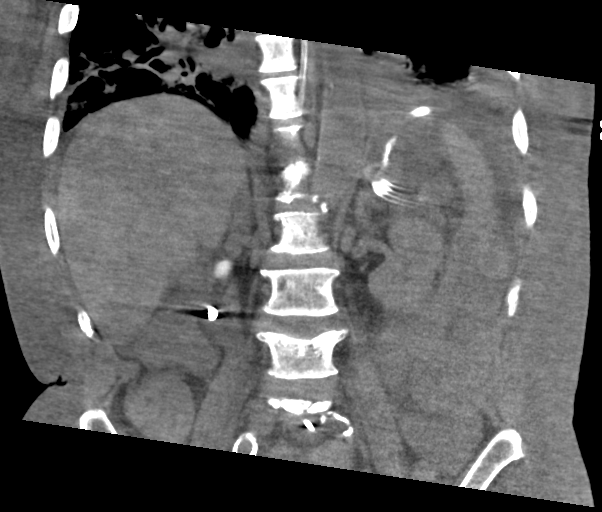

[16 of 46 positions shown; findings below may reference images not displayed]

FINDINGS: Lower chest: Trace right pleural fluid and at least a small left
pleural effusion. There is extensive consolidation in the left lower
lobe. Patchy airspace disease throughout the right lower lung with
postsurgical changes in the right lung.

Hepatobiliary: Trace perihepatic ascites. High-density material
layering in the gallbladder is suggestive for cholelithiasis. No
gross abnormality to the liver.

Pancreas: Unremarkable. No pancreatic ductal dilatation or
surrounding inflammatory changes.

Spleen: Normal in size without focal abnormality.

Adrenals/Urinary Tract: Normal appearance of the adrenal glands.
Normal appearance of both kidneys without stones or hydronephrosis.
Calcifications in the right renal hilum are probably vascular in
etiology.

Stomach/Bowel: Nasogastric feeding tube extends into the stomach and
terminates in the proximal duodenum. There is a large amount of
colon anterior to the stomach. There is no significant bowel
distension. Limited evaluation for bowel inflammation.

Vascular/Lymphatic: Diffuse atherosclerotic calcifications in the
aorta and iliac arteries. Negative for an abdominal aortic aneurysm.

Other: Trace perihepatic ascites. There may be trace ascites in the
left upper quadrant of the abdomen. Negative for free air. Evidence
for diffuse subcutaneous edema.

Musculoskeletal: Compression fractures involving L1, L4 and L5 of
unknown age.
IMPRESSION: 1. Transverse colon is located between the anterior abdominal wall
and the stomach. The colon location could preclude placement of a
percutaneous gastrostomy tube.
2. Trace upper abdominal ascites.
3. Extensive airspace disease in the right lower lung and a large
amount of consolidation in the left lower lobe. Bilateral pleural
effusions. Findings are concerning for bilateral pneumonia.
4. Lumbar vertebral body compression fractures of unknown age.

## 2021-07-23 MED ORDER — POLYETHYLENE GLYCOL 3350 17 G PO PACK: 17.0000 g | PACK | Freq: Every day | ORAL | Status: DC | PRN

## 2021-07-23 MED ORDER — DEXTROSE 10 % IV SOLN: INTRAVENOUS | Status: DC

## 2021-07-23 MED ORDER — DOCUSATE SODIUM 50 MG/5ML PO LIQD: 100.0000 mg | Freq: Two times a day (BID) | ORAL | Status: DC | PRN

## 2021-07-23 MED ORDER — MIDODRINE HCL 5 MG PO TABS
10.0000 mg | ORAL_TABLET | Freq: Three times a day (TID) | ORAL | Status: DC
  Administered 2021-07-23 – 2021-07-24 (×3): 10 mg
  Filled 2021-07-23 (×3): qty 2

## 2021-07-23 MED ORDER — AMIODARONE IV BOLUS ONLY 150 MG/100ML
150.0000 mg | Freq: Once | INTRAVENOUS | Status: AC
  Administered 2021-07-23: 150 mg via INTRAVENOUS
  Filled 2021-07-23: qty 100

## 2021-07-23 MED ORDER — FENTANYL CITRATE (PF) 100 MCG/2ML IJ SOLN
50.0000 ug | Freq: Once | INTRAMUSCULAR | Status: AC
  Administered 2021-07-23: 50 ug via INTRAVENOUS

## 2021-07-23 MED ORDER — ETOMIDATE 2 MG/ML IV SOLN
0.3000 mg/kg | Freq: Once | INTRAVENOUS | Status: AC
  Administered 2021-07-23: 20 mg via INTRAVENOUS

## 2021-07-23 MED ORDER — FUROSEMIDE 10 MG/ML IJ SOLN: 40.0000 mg | Freq: Three times a day (TID) | INTRAMUSCULAR | Status: DC

## 2021-07-23 MED ORDER — NOREPINEPHRINE 16 MG/250ML-% IV SOLN: 0.0000 ug/min | INTRAVENOUS | Status: DC

## 2021-07-23 MED ORDER — ROCURONIUM BROMIDE 10 MG/ML (PF) SYRINGE
PREFILLED_SYRINGE | INTRAVENOUS | Status: AC
  Filled 2021-07-23: qty 10

## 2021-07-23 MED ORDER — HEPARIN SODIUM (PORCINE) 5000 UNIT/ML IJ SOLN
5000.0000 [IU] | Freq: Three times a day (TID) | INTRAMUSCULAR | Status: DC
  Administered 2021-07-23 – 2021-07-27 (×13): 5000 [IU] via SUBCUTANEOUS
  Filled 2021-07-23 (×14): qty 1

## 2021-07-23 MED ORDER — ROCURONIUM BROMIDE 10 MG/ML (PF) SYRINGE
PREFILLED_SYRINGE | Freq: Once | INTRAVENOUS | Status: AC
  Administered 2021-07-23: 50 mg via INTRAVENOUS

## 2021-07-23 MED ORDER — MIDODRINE HCL 5 MG PO TABS: 15.0000 mg | ORAL_TABLET | Freq: Three times a day (TID) | ORAL | Status: DC

## 2021-07-23 MED ORDER — FUROSEMIDE 10 MG/ML IJ SOLN
40.0000 mg | Freq: Three times a day (TID) | INTRAMUSCULAR | Status: AC
  Administered 2021-07-23 (×3): 40 mg via INTRAVENOUS
  Filled 2021-07-23 (×3): qty 4

## 2021-07-23 MED ORDER — FENTANYL CITRATE (PF) 100 MCG/2ML IJ SOLN
INTRAMUSCULAR | Status: AC
  Filled 2021-07-23: qty 2

## 2021-07-23 MED ORDER — SENNA 8.6 MG PO TABS
1.0000 | ORAL_TABLET | Freq: Every day | ORAL | Status: DC | PRN
Start: 2021-07-23 — End: 2021-07-28

## 2021-07-23 MED ORDER — ETOMIDATE 2 MG/ML IV SOLN
INTRAVENOUS | Status: AC
  Filled 2021-07-23: qty 20

## 2021-07-23 MED ORDER — MIDAZOLAM HCL 2 MG/2ML IJ SOLN: 1.0000 mg | Freq: Once | INTRAMUSCULAR | Status: DC

## 2021-07-23 MED ORDER — POTASSIUM CHLORIDE 20 MEQ PO PACK
40.0000 meq | PACK | Freq: Two times a day (BID) | ORAL | Status: AC
  Administered 2021-07-23 (×2): 40 meq
  Filled 2021-07-23 (×2): qty 2

## 2021-07-23 MED ORDER — DEXTROSE-NACL 5-0.9 % IV SOLN: INTRAVENOUS | Status: DC

## 2021-07-23 MED ORDER — MIDAZOLAM HCL 2 MG/2ML IJ SOLN
INTRAMUSCULAR | Status: AC
  Filled 2021-07-23: qty 2

## 2021-07-23 MED ORDER — NOREPINEPHRINE 4 MG/250ML-% IV SOLN: 0.0000 ug/min | INTRAVENOUS | Status: DC

## 2021-07-23 NOTE — Progress Notes (Signed)
LTM maint complete - no skin breakdown under:  FP1 FP2 C3 C4 F8

## 2021-07-23 NOTE — Progress Notes (Signed)
eLink Physician-Brief Progress Note Patient Name: Gary Stevenson DOB: 05/29/1941 MRN: [id number]   Date of Service  07/23/2021  HPI/Events of Note  Glucose borderline low, pt NPO for planned procedure  eICU Interventions  Started on D5NS @50ml /hr. Will continue to monitor glucose trend.         [place] 07/23/2021, 5:35 AM

## 2021-07-23 NOTE — Procedures (Addendum)
Patient Name: Gary Stevenson  MRN: [id number]  Epilepsy Attending: Lora Havens  Referring Physician/Provider: Dr Rosalin Hawking Duration: 07/22/2021 1608 to 07/23/2021 1608   Patient history: 81yo m with right SDH, now with seizure like activity. EEG to evaluate for seizure   Level of alertness: comatose    AEDs during EEG study: LEV, phenobarb   Technical aspects: This EEG study was done with scalp electrodes positioned according to the 10-20 International system of electrode placement. Electrical activity was acquired at a sampling rate of 500Hz  and reviewed with a high frequency filter of 70Hz  and a low frequency filter of 1Hz . EEG data were recorded continuously and digitally stored.    Description: No posterior dominant rhythm was seen.  EEG showed continuous generalized and lateralized right hemisphere 3 to 6 Hz theta-delta slowing admixed with intermittent generalized 15 to 18 Hz beta activity.  Bilaterally independent periodic discharges with epileptiform morphology were also seen in left and right hemisphere intermittently at 1 to 2 Hz, more prominent when awake/stimulated.  Hyperventilation and photic stimulation were not performed.     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  This EEG pattern is on the ictal-interictal continuum with low to intermediate potential for seizures.  Additionally there is evidence of moderate to severe diffuse encephalopathy, nonspecific etiology.  No definite seizures were seen during the study.  [name]

## 2021-07-23 NOTE — Progress Notes (Signed)
Patient was transported to CT & back to 2M02 without any complications.

## 2021-07-23 NOTE — Procedures (Signed)
Percutaneous Tracheostomy Procedure Note   CAMBRIDGE DELEO  [id number]  07-03-1940  Date:07/23/21  Time:11:26 AM   Provider Performing:Simon Llamas Grier Mitts, NP-C  Procedure: Percutaneous Tracheostomy with Bronchoscopic Guidance (31600)  Indication(s) Prolonged Respiratory Failure  Consent Risks of the procedure as well as the alternatives and risks of each were explained to the patient and/or caregiver.  Consent for the procedure was obtained.  Anesthesia Etomidate, Versed, Fentanyl, Rocuronium  Time Out Verified patient identification, verified procedure, site/side was marked, verified correct patient position, special equipment/implants available, medications/allergies/relevant history reviewed, required imaging and test results available.   Sterile Technique Maximal sterile technique including sterile barrier drape, hand hygiene, sterile gown, sterile gloves, mask, hair covering.    Procedure Description Appropriate anatomy identified by palpation.  Patient's neck prepped and draped in sterile fashion.  1% lidocaine with epinephrine was used to anesthetize skin overlying neck.  1.5cm incision made and blunt dissection performed until tracheal rings could be easily palpated. Care was taken to avoid superficial blood vessels. Then a size 6 Shiley tracheostomy was placed under bronchoscopic visualization using usual Seldinger technique and serial dilation.  Bronchoscope confirmed placement above the carina.  Tracheostomy was sutured in place with adhesive pad to protect skin under pressure. Patient connected to ventilator.   Complications/Tolerance None; patient tolerated the procedure well. Chest X-ray is ordered to confirm no post-procedural complication.   EBL Minimal   Specimen(s) None   Supervised by Dr. Tamala Julian for entirety of procedure.    Noe Gens, MSN, APRN, NP-C, AGACNP-BC [facility] 07/23/2021, 11:32 AM   Please see [email or web address] for  pager details.   From 7A-7P if no response, please call [phone] After hours, please call ELink [phone]

## 2021-07-23 NOTE — Progress Notes (Addendum)
STROKE TEAM PROGRESS NOTE   INTERVAL HISTORY Patient is seen in his room with his RN at the bedside.  He has recently had a tracheostomy placed and is hypotensive after the procedure.  His neurological exam continues to be poor.  Vitals:   07/23/21 0800 07/23/21 0900 07/23/21 1000 07/23/21 1048  BP: (!) 101/57 114/81 113/74 (!) 81/43  Pulse: 82 82 87   Resp: 17 (!) 28 (!) 28   Temp: (!) 97.4 F (36.3 C)     TempSrc: Oral     SpO2: 100% 100% 100%   Weight:      Height:       CBC:  Recent Labs  Lab 07/22/21 0051 07/23/21 0318  WBC 15.9* 15.3*  HGB 8.0* 8.2*  HCT 23.1* 23.0*  MCV 83.4 83.0  PLT 206 183    Basic Metabolic Panel:  Recent Labs  Lab 07/22/21 0051 07/23/21 0318  NA 138 137  K 3.8 3.8  CL 107 105  CO2 22 23  GLUCOSE 135* 88  BUN 61* 66*  CREATININE 2.09* 1.91*  CALCIUM 7.1* 7.2*  MG 1.8 2.2  PHOS 2.2* 3.1    Lipid Panel:  Recent Labs  Lab 07/21/21 0322  CHOL 58  TRIG 83  HDL <10*  CHOLHDL NOT CALCULATED  VLDL 17  LDLCALC NOT CALCULATED    HgbA1c:  Recent Labs  Lab 07/21/21 0322  HGBA1C 5.7*    Urine Drug Screen: No results for input(s): LABOPIA, COCAINSCRNUR, LABBENZ, AMPHETMU, THCU, LABBARB in the last 168 hours.  Alcohol Level No results for input(s): ETH in the last 168 hours.  IMAGING past 24 hours Overnight EEG with video  Result Date: 07/23/2021 Charlsie Quest, MD     07/23/2021  9:41 AM Patient Name: Gary Stevenson MRN: [id number] Epilepsy Attending: Charlsie Quest Referring Physician/Provider: Dr Marvel Plan Duration: 07/22/2021 1608 to 07/23/2021 0930  Patient history: 81yo m with right SDH, now with seizure like activity. EEG to evaluate for seizure  Level of alertness: comatose  AEDs during EEG study: LEV, phenobarb  Technical aspects: This EEG study was done with scalp electrodes positioned according to the 10-20 International system of electrode placement. Electrical activity was acquired at a sampling rate of 500Hz  and  reviewed with a high frequency filter of 70Hz  and a low frequency filter of 1Hz . EEG data were recorded continuously and digitally stored.  Description: No posterior dominant rhythm was seen.  EEG showed continuous generalized and lateralized right hemisphere 3 to 6 Hz theta-delta slowing admixed with intermittent generalized 15 to 18 Hz beta activity.  Bilaterally independent periodic discharges with epileptiform morphology were also seen in left and right hemisphere intermittently at 1 to 2 Hz, more prominent when awake/stimulated.  Hyperventilation and photic stimulation were not performed.   ABNORMALITY - Bilateral independent periodic discharges, left and right hemisphere - Continuous slow, generalized and lateralized right hemisphere  IMPRESSION: This study showed evidence of of independent epileptogenicity arising from left and right hemisphere.  This EEG pattern is on the ictal-interictal continuum with low to intermediate potential for seizures.  Additionally there is evidence of moderate to severe diffuse encephalopathy, nonspecific etiology.  No definite seizures were seen during the study. Charlsie Quest    ECHO TEE  Result Date: 07/22/2021    TRANSESOPHOGEAL ECHO REPORT   Patient Name:   Gary Stevenson Date of Exam: 07/22/2021 Medical Rec #:  [id number]      Height:  65.0 in Accession #:    [id number]     Weight:       96.6 lb Date of Birth:  [birth year]      BSA:          1.452 m Patient Age:    81 years       BP:           108/69 mmHg Patient Gender: M              HR:           98 bpm. Exam Location:  Inpatient Procedure: Transesophageal Echo, Color Doppler and Cardiac Doppler Indications:     Bacteremia  History:         Patient has prior history of Echocardiogram examinations, most                  recent 07/18/2021. COPD; Risk Factors:Hypertension.  Sonographer:     Irving Burton Senior RDCS Referring Phys:  [id number] Bevelyn Buckles BENSIMHON Diagnosing Phys: Arvilla Meres MD PROCEDURE: After discussion of  the risks and benefits of a TEE, an informed consent was obtained from the patient. The transesophogeal probe was passed without difficulty through the esophogus of the patient. Sedation performed by performing physician. The patient developed no complications during the procedure. No sedation required. IMPRESSIONS  1. Left ventricular ejection fraction, by estimation, is 55 to 60%. The left ventricle has normal function.  2. Right ventricular systolic function is normal. The right ventricular size is normal.  3. Left atrial size was moderately dilated. No left atrial/left atrial appendage thrombus was detected.  4. Right atrial size was moderately dilated.  5. No vegetation. The mitral valve is normal in structure. Trivial mitral valve regurgitation.  6. There is a filamentous-like lesion underneath the tricuspird valve which I suspect is part of the chordal structure but cannot completely exclude vegetation.  7. No vegetation. The aortic valve is normal in structure. Aortic valve regurgitation is trivial.  8. No vegetation. FINDINGS  Left Ventricle: Left ventricular ejection fraction, by estimation, is 55 to 60%. The left ventricle has normal function. The left ventricular internal cavity size was normal in size. Right Ventricle: The right ventricular size is normal. No increase in right ventricular wall thickness. Right ventricular systolic function is normal. Left Atrium: Left atrial size was moderately dilated. No left atrial/left atrial appendage thrombus was detected. Right Atrium: Right atrial size was moderately dilated. Pericardium: There is no evidence of pericardial effusion. Mitral Valve: No vegetation. The mitral valve is normal in structure. Trivial mitral valve regurgitation. Tricuspid Valve: There is a filamentous-like lesion underneath the tricuspird valve which I suspect is part of the chordal structure but cannot completely exclude vegetation. The tricuspid valve is normal in structure. Tricuspid  valve regurgitation is trivial. Aortic Valve: No vegetation. The aortic valve is normal in structure. Aortic valve regurgitation is trivial. Pulmonic Valve: The pulmonic valve was normal in structure. Pulmonic valve regurgitation is trivial. Aorta: The aortic root is normal in size and structure. Pulmonary Artery: No vegetation. IAS/Shunts: No atrial level shunt detected by color flow Doppler. Arvilla Meres MD Electronically signed by Arvilla Meres MD Signature Date/Time: 07/22/2021/6:08:08 PM    Final    VAS [place] LOWER EXTREMITY VENOUS (DVT)  Result Date: 07/21/2021  Lower Venous DVT Study Patient Name:  Gary Stevenson  Date of Exam:   07/20/2021 Medical Rec #: [id number]       Accession #:    [id number] Date of Birth:  1941/05/03       Patient Gender: M Patient Age:   57 years Exam Location:  [facility] Procedure:      VAS [place] LOWER EXTREMITY VENOUS (DVT) Referring Phys: Scheryl Marten Raygan Skarda --------------------------------------------------------------------------------  Indications: Stroke.  Risk Factors: None identified. Limitations: Poor ultrasound/tissue interface and patient positioning, patient immobility. Comparison Study: No prior studies. Performing Technologist: Chanda Busing RVT  Examination Guidelines: A complete evaluation includes B-mode imaging, spectral Doppler, color Doppler, and power Doppler as needed of all accessible portions of each vessel. Bilateral testing is considered an integral part of a complete examination. Limited examinations for reoccurring indications may be performed as noted. The reflux portion of the exam is performed with the patient in reverse Trendelenburg.  +---------+---------------+---------+-----------+----------+-------------------+  RIGHT     Compressibility Phasicity Spontaneity Properties Thrombus Aging       +---------+---------------+---------+-----------+----------+-------------------+  CFV       Full            Yes       Yes                                          +---------+---------------+---------+-----------+----------+-------------------+  SFJ       Full                                                                  +---------+---------------+---------+-----------+----------+-------------------+  FV Prox   Full                                                                  +---------+---------------+---------+-----------+----------+-------------------+  FV Mid    Full                                                                  +---------+---------------+---------+-----------+----------+-------------------+  FV Distal Full                                                                  +---------+---------------+---------+-----------+----------+-------------------+  PFV       Full                                                                  +---------+---------------+---------+-----------+----------+-------------------+  POP       Full  Yes       Yes                                         +---------+---------------+---------+-----------+----------+-------------------+  PTV       Full                                                                  +---------+---------------+---------+-----------+----------+-------------------+  PERO                                                       Not well visualized  +---------+---------------+---------+-----------+----------+-------------------+   +---------+---------------+---------+-----------+----------+-------------------+  LEFT      Compressibility Phasicity Spontaneity Properties Thrombus Aging       +---------+---------------+---------+-----------+----------+-------------------+  CFV       Full            Yes       Yes                                         +---------+---------------+---------+-----------+----------+-------------------+  SFJ       Full                                                                   +---------+---------------+---------+-----------+----------+-------------------+  FV Prox   Full                                                                  +---------+---------------+---------+-----------+----------+-------------------+  FV Mid    Full                                                                  +---------+---------------+---------+-----------+----------+-------------------+  FV Distal Full                                                                  +---------+---------------+---------+-----------+----------+-------------------+  PFV       Full                                                                  +---------+---------------+---------+-----------+----------+-------------------+  POP       Full            Yes       Yes                                         +---------+---------------+---------+-----------+----------+-------------------+  PTV       Full                                                                  +---------+---------------+---------+-----------+----------+-------------------+  PERO                                                       Not well visualized  +---------+---------------+---------+-----------+----------+-------------------+     Summary: RIGHT: - There is no evidence of deep vein thrombosis in the lower extremity. However, portions of this examination were limited- see technologist comments above.  - No cystic structure found in the popliteal fossa.  LEFT: - There is no evidence of deep vein thrombosis in the lower extremity. However, portions of this examination were limited- see technologist comments above.  - No cystic structure found in the popliteal fossa.  *See table(s) above for measurements and observations. Electronically signed by Gerarda Fraction on 07/21/2021 at 6:50:30 AM.    Final     PHYSICAL EXAM  Physical Exam  Constitutional: Ill appearing african [place] male   Respiratory: Tracheostomy in  place, respirations synchronous  with ventilator  Neuro: Patient is intubated with no sedation running. Does not respond to noxious stimuli, no movement to pain or spontaneous movement in any extremity. Eyes midline, no dolls eyes. Pupils 2mm and sluggish, no corneal reflex. No gag reflex and weak cough with endotracheal suctioning   ASSESSMENT/PLAN Mr. Gary Stevenson is a 81 y.o. male with history of prior tobacco use, brain aneurysm, right frontal SDH s/p surgical evacuation in 2012, lung cancer s/p RML lobectomy 01/2009, ETOH abuse, and CKD who is admitted s/p fall 06/30/2021 for resultant right femoral neck fracutre and right acetabular fracture. Initially neurology was consulted for post-operative concern for seizure. A few hours after the procedure, the patient was seen to have high amplitude right upper extremity "shaking" bilaterally that lasted approximately 1 minute in duration as witnessed by nursing. The onset was not witnessed. He has been encephalopathic since after surgery, but was noted to be alert and oriented prior to procedure.  1/12 he underwent a R hip arthroplasty.  1/13 seizure activity was noted.  1/17- transferred to ICU and intubated. 1/18 MRI brain showed stable bilateral frontal subacute subdural hematoma.  1/19 cEEG- BIPDs with some brief electrographic seizures during the initial portion of the recording, indicative of a multifocal epileptogenic disturbance.  1/22- MRI - Extensive acute infarcts in the bilateral posterior circulation. Subtotal involvement of the left pons. Scattered bilateral cerebellar, occipital, and parietal lobe involvement. Cytotoxic edema without acute hemorrhage. Right > left mostly subfrontal SDH is stable since scan on 07/15/2021.  1/23- Breakthrough seizure activity, discuss with Dr. Melynda Ripple as far as  titration of phenobarbital and keppra.  1/24 Unable to perform TEE due to tongue swelling.  Will reassess 1/25 1/25 TEE performed showing no PFO or ASD and filamentous structure on  underside of TV, likely chordal apparatus 1/26 EEG on ictal-interictal continuum with low-intermediate potential for seizures  If family wishes to continue with stroke work up, plan for a loop recorder or 30 day monitor at discharge.   Stroke:  Scattered bilateral cerebellar, occipital, and parietal infarcts with left pontine involvement likely secondary to cardio embolic source Code Stroke- Stable small bilateral subdural hematomas over the anterior and inferior aspects of the frontal lobes bilaterally not significantly changed from the prior exam. There is mild mass effect on the frontal lobe on the right with no midline shift. Atrophy with chronic microvascular ischemic changes. 1/22/20233- MRI  Extensive acute infarcts in the bilateral posterior circulation. Subtotal involvement of the left pons. Scattered bilateral cerebellar, occipital, and parietal lobe involvement. Cytotoxic edema without acute hemorrhage. Right > left mostly subfrontal SDH is stable since 07/15/2021.  MRA  No LVO, 5 mm wide necked outpouching extending from the cavernous left ICA, suspicious for possible aneurysm. 2D Echo 50-55% Lower extremity DVT TEE no endocarditis on MV and AV.  There is a filamentous mobile structure on the underside of the TV. Suspect chordal apparatus but cannot completely exclude vegetation.  However, no PFO or ASD.  This would not explain the stroke.  LDL 15.6 HgbA1c 5.7 VTE prophylaxis - SCDs No antithrombotic prior to admission, now on ASA 81 is reasonable. Do not recommend DAPT given cerebral angiogram and pending PEG prodedure Therapy recommendations:  Pending Disposition:  Pending  Hypertension Home meds:  None Stable Permissive hypertension (OK if < 220/120) but gradually normalize in 5-7 days Long-term BP goal normotensive Hypotensive post procedure, norepi as needed  Hyperlipidemia Home meds:  None, resumed in hospital LDL 15.6 , goal < 70 Continue statin at discharge  Other  Stroke Risk Factors Advanced Age >/= 90  Cigarette smoker, advised to stop smoking ETOH use, alcohol level, 2 drink(s) a day Hx stroke/TIA Chronic SDH- now noted to be stable on recent scans  Other Active Problems Respiratory Failure- Hypoxic hypercarbic s/p trach - CCM on board Acute metabolic encephalopathy AKI on CKD Close displaced fracture of the right femoral neck Status post right hip total arthroplasty on 1/12 by orthopedic surgery.   Severe protein caloric malnutrition Failure to thrive Hypoglycemia Acidemia related to CKD and GI losses D5 w/ bicarb Klebsiella, Enterobacter pneumonia Septic shock DC vanc, on Meropenem  Seizure History of seizure on keppra, recent seizure activity? Phenobarbital and levetiracetam  Taper phenobarbital to see if neuro exam improves LTM EEG shows "evidence of of independent epileptogenicity arising from left and right hemisphere.  This EEG pattern is on the ictal-interictal continuum with low to intermediate potential for seizures.  Additionally there is evidence of moderate to severe diffuse encephalopathy, nonspecific etiology.  No definite seizures were seen during the study."  Hospital day # 15  Patient seen and examined by NP/APP with MD. MD to update note as needed.   Cortney E Ernestina Columbia , MSN, AGACNP-BC [facility] See Amion for schedule and pager information 07/23/2021 12:09 PM    ATTENDING NOTE: I reviewed above note and agree with the assessment and plan. Pt was seen and examined.   No family at bedside.  Patient lying in bed, still unresponsive.  Had tracheostomy this morning, currently on trach care, still on vent.  Neuro exam  unchanged, not open eyes on voice or stimulation, no doll's eyes, not blinking to visual threat, however does have pupillary reflex bilaterally, however no corneal, not breathing over the vent but RR set at 28.  Not moving extremities on pain stimulation.  Currently on aspirin 81,  recommend to continue but against a DAPT at this time given cerebral aneurysm and upcoming PEG tube placement.  Continue taper phenobarbital, long-term EEG showed Ito interictal continuum with low to intermittent potential for seizure, severe diffuse encephalopathy.  LDL 15.6, no statin needed.  We will follow.  For detailed assessment and plan, please refer to above as I have made changes wherever appropriate.   Marvel Plan, MD PhD Stroke Neurology 07/23/2021 4:00 PM  This patient is critically ill due to extensive posterior stroke, nonconvulsive status, respiratory failure, sepsis, septic shock, renal failure and at significant risk of neurological worsening, death form recurrent stroke, status epilepticus, septic shock, heart failure. This patient's care requires constant monitoring of vital signs, hemodynamics, respiratory and cardiac monitoring, review of multiple databases, neurological assessment, discussion with family, other specialists and medical decision making of high complexity. I spent 30 minutes of neurocritical care time in the care of this patient.    To contact Stroke Continuity provider, please refer to [email or web address]. After hours, contact General Neurology

## 2021-07-23 NOTE — Procedures (Signed)
Diagnostic Bronchoscopy  Gary Stevenson  [id number]  1941-01-14  Date:07/23/21  Time:10:36 AM   Provider Performing:Fiore Detjen M Coy Saunas   Procedure: Diagnostic Bronchoscopy (40814)  Indication(s) Assist with direct visualization of tracheostomy placement  Consent Risks of the procedure as well as the alternatives and risks of each were explained to the patient and/or caregiver.  Consent for the procedure was obtained.   Anesthesia See separate tracheostomy note   Time Out Verified patient identification, verified procedure, site/side was marked, verified correct patient position, special equipment/implants available, medications/allergies/relevant history reviewed, required imaging and test results available.   Sterile Technique Usual hand hygiene, masks, gowns, and gloves were used   Procedure Description Bronchoscope advanced through endotracheal tube and into airway.  After suctioning out tracheal secretions, bronchoscope used to provide direct visualization of tracheostomy placement.   Complications/Tolerance None; patient tolerated the procedure well.   EBL None  Specimen(s) None

## 2021-07-23 NOTE — Progress Notes (Signed)
eLink Physician-Brief Progress Note Patient Name: Gary Stevenson DOB: Aug 17, 1940 MRN: [id number]   Date of Service  07/23/2021  HPI/Events of Note  Patient went into atrial fibrillation with RVR, rate 140's, SBP 102, QTC 427. EKG obtained by bedside RN. Echocardiogram yesterday showed normal LV function, with moderate bi-atrial enlargement.  eICU Interventions  Amiodarone 150 mg iv bolus x 1, BMP and Mg++ have been sent, if rhythm is not terminated by the bolus an infusion will be ordered, as well as consideration of full anticoagulation.         Kerry Kass Youcef Klas 07/23/2021, 8:09 PM

## 2021-07-23 NOTE — Progress Notes (Signed)
NAME:  Gary Stevenson, MRN:  [id number], DOB:  [birth year], LOS: 15 ADMISSION DATE:  07/15/2021, CONSULTATION DATE: 07/14/2021 REFERRING MD:  Gary Stevenson, CHIEF COMPLAINT:  AMS, Respiratory failure   History of Present Illness:  Gary Stevenson is a 81 y.o. M with PMH significant for right frontal subdural hematomas status post surgical evacuation in 2012, lung cancer status post lobectomy, alcohol abuse, CKD who presented to the ED 1/12 after a fall and was found to have R acetabular fx with interval displacement and underwent R total hip arthroplasty on 07-27-2021.  He was later found with decreased responsiveness and seizure activity that lasted for around 1 minute.  He was seen by Neurology and started on Phenobarbital and Keppra with LTM EEG.  Neurology felt seizure was most likely secondary to subdural hematoma and he was gradually improving until 1/17 when he was noted to have worsening mental status again with hypotension.  ABG 7.134 and pCO2 60.7, patient's mental status was not amenable for Bipap, so PCCM consulted for transfer.   Pertinent  Medical History   Past Medical History:  Diagnosis Date   Arthritis    Brain aneurysm    ETOH abuse    intoxicated   Lung cancer (HCC)    Significant Hospital Events: Including procedures, antibiotic start and stop dates in addition to other pertinent events   1/11 admission for R hip fx 1/12 R hip arthoplasty 1/13 Sz activity 1/17 worsening mental status, PCCM consult and ICU txfr, intubated  1/18 CT head with stable small bilateral subdural hematomas but no acute intracranial abnormalities. 1/18 EEG >> abnormal comatose EEG, no epileptiform discharges 1/18 MRI brain >> stable bilateral frontal subacute subdural hematoma.  No new intracranial abnormalities. 1/19 CXR>> stable multilobar pneumonia, resolved RUL collapse, continued LLL consolidation/collapse 1/19 cEEG >>BIPDs with some brief electrographic seizures during the initial portion of the  recording, indicative of a multifocal epileptogenic disturbance. Seizures were improved in the afternoon and evening 1/21 worsening shock, acidosis.  Antibiotics changed to Vanco and meropenem.  Made DNR after family meeting 1/23 palliative met with family and seems that family wants full scope of offered tx, continue DNR   Interim History / Subjective:  No events. On cEEG while doing phenobarb wean. Remains comatose.  Objective   Blood pressure (!) 90/58, pulse 83, temperature (!) 96.4 F (35.8 C), temperature source Axillary, resp. rate (!) 28, height 5\' 5"  (1.651 m), weight 43.8 kg, SpO2 100 %.    Vent Mode: PRVC FiO2 (%):  [40 %] 40 % Set Rate:  [28 bmp] 28 bmp Vt Set:  [490 mL] 490 mL PEEP:  [5 cmH20] 5 cmH20 Pressure Support:  [12 cmH20] 12 cmH20 Plateau Pressure:  [22 cmH20-27 cmH20] 22 cmH20   Intake/Output Summary (Last 24 hours) at 07/23/2021 7829 Last data filed at 07/23/2021 0700 Gross per 24 hour  Intake 826.03 ml  Output 650 ml  Net 176.03 ml    Filed Weights   07/20/21 0400 07/20/21 0500  Weight: 43.8 kg 43.8 kg    Chronically ill man in NAD Temporal wasting, +JVD OGT out, stable tongue swelling Pupils small, sluggish, corneal clouding on R chronic, more downward gaze today Roving R eye, left eye stays midline No corneals No response to painful stimuli, no cough/gag Cannot get a motor response Diffuse anasarca stable  Cr improved K low  Resolved Hospital Problem list   Hypernatremia Respiratory acidosis Septic shock  Ileus Assessment & Plan:   Acute metabolic encephalopathy Bilateral infarcts involving  cerebellar, occipital, and parietal regions with L pontine involvement, with cytotoxic edema  Chronic SDH L ICA aneurysm r/o mycotic Hx seizures P - DAPT once trach/PEG done - GDMT, AEDs, cEEG, phenobarb wean per neurology - Baby aspirin for now  Acute hypoxic and hypercarbic respiratory failure-ETT 1/17 PNA due to klebsiella pneumoniae and  enterobacter cloacae -mero end 1/25  Worsening white count, secretions 1/25- stable, sputum cx neg to date P -Secretion burden and mental status preclude extubation -Multiple conversations with family regarding prognosis and what Gary Stevenson would have wanted: they would like to pursue trach/PEG and LTACH -Trach today then usual post trach bundle  AKI on CKD IIIa  Severe protein calorie malnutrition FTT in adult Hypoglycemia  Metabolic acidosis related to GI losses, AKI on CKD - improved Volume overload/third spacing - D10 while NPO - Start lasix challenge - Resume TF after trach and hopefully get d10 off - PEG consult  Displaced R femoral neck fracture, R acetabulum fx , R femoral head impaction fx-s/p R total hip arthroplasty 1/12 - local care  DNR status Goals of Care LTACH is goal  Best Practice (right click and "Reselect all SmartList Selections" daily)   Diet/type: NPO EN via cortrak DVT prophylaxis: heparin GI prophylaxis: PPI Lines: N/A Foley:  N/A Code Status: DNR Last date of multidisciplinary goals of care discussion [1/24 by Gary Stevenson then 1/25 by Gary Stevenson]   Patient critically ill due to respiratory failure, encephalopathy Interventions to address this today family discussions, vent titration Risk of deterioration without these interventions is high  I personally spent 34 minutes providing critical care not including any separately billable procedures  Gary Halsted MD [facility]  Prefer epic messenger for cross cover needs If after hours, please call E-link

## 2021-07-24 ENCOUNTER — Encounter (HOSPITAL_COMMUNITY): Payer: Self-pay | Admitting: General Surgery

## 2021-07-24 DIAGNOSIS — Z930 Tracheostomy status: Secondary | ICD-10-CM

## 2021-07-24 DIAGNOSIS — J9601 Acute respiratory failure with hypoxia: Secondary | ICD-10-CM | POA: Diagnosis not present

## 2021-07-24 DIAGNOSIS — D62 Acute posthemorrhagic anemia: Secondary | ICD-10-CM | POA: Diagnosis not present

## 2021-07-24 DIAGNOSIS — I6340 Cerebral infarction due to embolism of unspecified cerebral artery: Secondary | ICD-10-CM | POA: Diagnosis not present

## 2021-07-24 DIAGNOSIS — S72001A Fracture of unspecified part of neck of right femur, initial encounter for closed fracture: Secondary | ICD-10-CM | POA: Diagnosis not present

## 2021-07-24 DIAGNOSIS — Z4659 Encounter for fitting and adjustment of other gastrointestinal appliance and device: Secondary | ICD-10-CM

## 2021-07-24 LAB — CBC
HCT: 22.6 % — ABNORMAL LOW (ref 39.0–52.0)
Hemoglobin: 7.6 g/dL — ABNORMAL LOW (ref 13.0–17.0)
MCH: 28.7 pg (ref 26.0–34.0)
MCHC: 33.6 g/dL (ref 30.0–36.0)
MCV: 85.3 fL (ref 80.0–100.0)
Platelets: 214 10*3/uL (ref 150–400)
RBC: 2.65 MIL/uL — ABNORMAL LOW (ref 4.22–5.81)
RDW: 17.3 % — ABNORMAL HIGH (ref 11.5–15.5)
WBC: 13.9 10*3/uL — ABNORMAL HIGH (ref 4.0–10.5)
nRBC: 0.4 % — ABNORMAL HIGH (ref 0.0–0.2)

## 2021-07-24 LAB — CULTURE, RESPIRATORY W GRAM STAIN

## 2021-07-24 LAB — BASIC METABOLIC PANEL
Anion gap: 8 (ref 5–15)
BUN: 67 mg/dL — ABNORMAL HIGH (ref 8–23)
CO2: 21 mmol/L — ABNORMAL LOW (ref 22–32)
Calcium: 7.2 mg/dL — ABNORMAL LOW (ref 8.9–10.3)
Chloride: 108 mmol/L (ref 98–111)
Creatinine, Ser: 2.16 mg/dL — ABNORMAL HIGH (ref 0.61–1.24)
GFR, Estimated: 30 mL/min — ABNORMAL LOW (ref >60)
Glucose, Bld: 102 mg/dL — ABNORMAL HIGH (ref 70–99)
Potassium: 4.9 mmol/L (ref 3.5–5.1)
Sodium: 137 mmol/L (ref 135–145)

## 2021-07-24 LAB — GLUCOSE, CAPILLARY
Glucose-Capillary: 77 mg/dL (ref 70–99)
Glucose-Capillary: 96 mg/dL (ref 70–99)
Glucose-Capillary: 89 mg/dL (ref 70–99)
Glucose-Capillary: 96 mg/dL (ref 70–99)
Glucose-Capillary: 93 mg/dL (ref 70–99)
Glucose-Capillary: 77 mg/dL (ref 70–99)
Glucose-Capillary: 92 mg/dL (ref 70–99)

## 2021-07-24 LAB — MAGNESIUM: Magnesium: 2.2 mg/dL (ref 1.7–2.4)

## 2021-07-24 LAB — PHOSPHORUS: Phosphorus: 3.2 mg/dL (ref 2.5–4.6)

## 2021-07-24 MED ORDER — ALBUMIN HUMAN 25 % IV SOLN
50.0000 g | Freq: Once | INTRAVENOUS | Status: AC
  Administered 2021-07-24: 50 g via INTRAVENOUS
  Filled 2021-07-24: qty 200

## 2021-07-24 MED ORDER — MIDODRINE HCL 5 MG PO TABS
15.0000 mg | ORAL_TABLET | Freq: Three times a day (TID) | ORAL | Status: DC
  Administered 2021-07-24 – 2021-07-27 (×11): 15 mg
  Filled 2021-07-24 (×11): qty 3

## 2021-07-24 MED ORDER — ALBUMIN HUMAN 25 % IV SOLN
12.5000 g | Freq: Once | INTRAVENOUS | Status: DC
  Filled 2021-07-24: qty 50

## 2021-07-24 NOTE — Procedures (Signed)
Request to IR for possible percutaneous G-tube placement -- CT abd w/o contrast shows transverse colon to be overlying the stomach, as such he is not a candidate for G-tube placement in IR. Recommend surgical consultation if G-tube is still desired.  Dr. Shearon Stalls aware.  IR remains available as needed.  Candiss Norse, PA-C

## 2021-07-24 NOTE — Procedures (Addendum)
Patient Name: Gary Stevenson  MRN: [id number]  Epilepsy Attending: Lora Havens  Referring Physician/Provider: Dr Rosalin Hawking Duration: 07/23/2021 1608 to 07/24/2021 1608   Patient history: 81yo m with right SDH, now with seizure like activity. EEG to evaluate for seizure   Level of alertness: awake/lethargic, asleep   AEDs during EEG study: LEV, phenobarb   Technical aspects: This EEG study was done with scalp electrodes positioned according to the 10-20 International system of electrode placement. Electrical activity was acquired at a sampling rate of 500Hz  and reviewed with a high frequency filter of 70Hz  and a low frequency filter of 1Hz . EEG data were recorded continuously and digitally stored.    Description: No posterior dominant rhythm was seen.  Sleep was characterized by sleep spindles (12 to 14 Hz), maximum frontocentral region.  EEG showed continuous generalized and lateralized right hemisphere 3 to 6 Hz theta-delta slowing admixed with intermittent generalized 15 to 18 Hz beta activity.  EEG showed periodic discharges with triphasic morphology which at times were generalized and at times looked independent from left and right hemisphere, at 1 to 2 Hz, more prominent when awake/stimulated.  Hyperventilation and photic stimulation were not performed.     ABNORMALITY - Bilateral independent periodic discharges with triphasic morphology, left and right hemisphere - Continuous slow, generalized and lateralized right hemisphere   IMPRESSION: This study showed bilateral independent periodic discharges with triphasic morphology. This EEG pattern is on the ictal-interictal continuum.  However, the morphology, frequency and reactivity to stimulation could also be seen due to toxic-metabolic causes. Additionally there is evidence of moderate to severe diffuse encephalopathy, nonspecific etiology.  No definite seizures were seen during the study.   [name]

## 2021-07-24 NOTE — Progress Notes (Addendum)
STROKE TEAM PROGRESS NOTE   INTERVAL HISTORY Patient is seen in his room with his RN at the bedside.  He has recently had a tracheostomy placed and plan for PEG tube today. He is on of levophed for blood pressure support. He is inconsistently opening his eyes to noxious stimuli with spontaneous blink. No blink to visual threat. Minimal withdrawal to pain on left upper extremity.   Vitals:   07/24/21 0645 07/24/21 0700 07/24/21 0715 07/24/21 0726  BP: (!) 86/51 103/61    Pulse: 72 76 73   Resp: (!) 28 (!) 28 19   Temp:    (!) 97.3 F (36.3 C)  TempSrc:    Oral  SpO2: 100% 100%    Weight:      Height:       CBC:  Recent Labs  Lab 07/23/21 0318 07/24/21 0435  WBC 15.3* 13.9*  HGB 8.2* 7.6*  HCT 23.0* 22.6*  MCV 83.0 85.3  PLT 183 214   Basic Metabolic Panel:  Recent Labs  Lab 07/23/21 0318 07/23/21 1800 07/23/21 2003 07/24/21 0435  NA 137 138  --  137  K 3.8 4.3  --  4.9  CL 105 107  --  108  CO2 23 22  --  21*  GLUCOSE 88 103*  --  102*  BUN 66* 67*  --  67*  CREATININE 1.91* 2.16*  --  2.16*  CALCIUM 7.2* 7.2*  --  7.2*  MG 2.2  --  2.0 2.2  PHOS 3.1  --   --  3.2   Lipid Panel:  Recent Labs  Lab 07/21/21 0322  CHOL 58  TRIG 83  HDL <10*  CHOLHDL NOT CALCULATED  VLDL 17  LDLCALC NOT CALCULATED   HgbA1c:  Recent Labs  Lab 07/21/21 0322  HGBA1C 5.7*   Urine Drug Screen: No results for input(s): LABOPIA, COCAINSCRNUR, LABBENZ, AMPHETMU, THCU, LABBARB in the last 168 hours.  Alcohol Level No results for input(s): ETH in the last 168 hours.  IMAGING past 24 hours CT ABDOMEN WO CONTRAST  Result Date: 07/24/2021 CLINICAL DATA:  Evaluate anatomy for potential G-tube. EXAM: CT ABDOMEN WITHOUT CONTRAST TECHNIQUE: Multidetector CT imaging of the abdomen was performed following the standard protocol without IV contrast. RADIATION DOSE REDUCTION: This exam was performed according to the departmental dose-optimization program which includes automated  exposure control, adjustment of the mA and/or kV according to patient size and/or use of iterative reconstruction technique. COMPARISON:  PET-CT 01/09/2009 FINDINGS: Lower chest: Trace right pleural fluid and at least a small left pleural effusion. There is extensive consolidation in the left lower lobe. Patchy airspace disease throughout the right lower lung with postsurgical changes in the right lung. Hepatobiliary: Trace perihepatic ascites. High-density material layering in the gallbladder is suggestive for cholelithiasis. No gross abnormality to the liver. Pancreas: Unremarkable. No pancreatic ductal dilatation or surrounding inflammatory changes. Spleen: Normal in size without focal abnormality. Adrenals/Urinary Tract: Normal appearance of the adrenal glands. Normal appearance of both kidneys without stones or hydronephrosis. Calcifications in the right renal hilum are probably vascular in etiology. Stomach/Bowel: Nasogastric feeding tube extends into the stomach and terminates in the proximal duodenum. There is a large amount of colon anterior to the stomach. There is no significant bowel distension. Limited evaluation for bowel inflammation. Vascular/Lymphatic: Diffuse atherosclerotic calcifications in the aorta and iliac arteries. Negative for an abdominal aortic aneurysm. Other: Trace perihepatic ascites. There may be trace ascites in the left upper quadrant of the abdomen.  Negative for free air. Evidence for diffuse subcutaneous edema. Musculoskeletal: Compression fractures involving L1, L4 and L5 of unknown age. IMPRESSION: 1. Transverse colon is located between the anterior abdominal wall and the stomach. The colon location could preclude placement of a percutaneous gastrostomy tube. 2. Trace upper abdominal ascites. 3. Extensive airspace disease in the right lower lung and a large amount of consolidation in the left lower lobe. Bilateral pleural effusions. Findings are concerning for bilateral  pneumonia. 4. Lumbar vertebral body compression fractures of unknown age. Electronically Signed   By: Richarda Overlie M.D.   On: 07/24/2021 08:31   DG Chest Port 1 View  Result Date: 07/23/2021 CLINICAL DATA:  Tracheostomy placement EXAM: PORTABLE CHEST 1 VIEW COMPARISON:  07/22/2021 FINDINGS: Interval removal of ET tube and placement of tracheostomy tube which appears appropriately position. Previously seen small bore enteric tube has been removed. Large bore feeding tube remains in place with distal tip extending beyond the inferior margin of the film. Normal heart size. Persistent patchy bibasilar opacities, right worse than left. Small bilateral pleural effusions, left greater than right. No pneumothorax. IMPRESSION: 1. Interval placement of tracheostomy tube. 2. Persistent patchy bibasilar opacities and small bilateral pleural effusions. Electronically Signed   By: Duanne Guess D.O.   On: 07/23/2021 12:48   VAS [place] LOWER EXTREMITY VENOUS (DVT)  Result Date: 07/21/2021  Lower Venous DVT Study Patient Name:  Gary Stevenson  Date of Exam:   07/20/2021 Medical Rec #: [id number]       Accession #:    [id number] Date of Birth: 1940-10-19       Patient Gender: M Patient Age:   81 years Exam Location:  [facility] Procedure:      VAS [place] LOWER EXTREMITY VENOUS (DVT) Referring Phys: Scheryl Marten Zareah Hunzeker --------------------------------------------------------------------------------  Indications: Stroke.  Risk Factors: None identified. Limitations: Poor ultrasound/tissue interface and patient positioning, patient immobility. Comparison Study: No prior studies. Performing Technologist: Chanda Busing RVT  Examination Guidelines: A complete evaluation includes B-mode imaging, spectral Doppler, color Doppler, and power Doppler as needed of all accessible portions of each vessel. Bilateral testing is considered an integral part of a complete examination. Limited examinations for reoccurring indications may be performed as  noted. The reflux portion of the exam is performed with the patient in reverse Trendelenburg.  +---------+---------------+---------+-----------+----------+-------------------+  RIGHT     Compressibility Phasicity Spontaneity Properties Thrombus Aging       +---------+---------------+---------+-----------+----------+-------------------+  CFV       Full            Yes       Yes                                         +---------+---------------+---------+-----------+----------+-------------------+  SFJ       Full                                                                  +---------+---------------+---------+-----------+----------+-------------------+  FV Prox   Full                                                                  +---------+---------------+---------+-----------+----------+-------------------+  FV Mid    Full                                                                  +---------+---------------+---------+-----------+----------+-------------------+  FV Distal Full                                                                  +---------+---------------+---------+-----------+----------+-------------------+  PFV       Full                                                                  +---------+---------------+---------+-----------+----------+-------------------+  POP       Full            Yes       Yes                                         +---------+---------------+---------+-----------+----------+-------------------+  PTV       Full                                                                  +---------+---------------+---------+-----------+----------+-------------------+  PERO                                                       Not well visualized  +---------+---------------+---------+-----------+----------+-------------------+   +---------+---------------+---------+-----------+----------+-------------------+  LEFT      Compressibility Phasicity Spontaneity Properties Thrombus  Aging       +---------+---------------+---------+-----------+----------+-------------------+  CFV       Full            Yes       Yes                                         +---------+---------------+---------+-----------+----------+-------------------+  SFJ       Full                                                                  +---------+---------------+---------+-----------+----------+-------------------+  FV Prox   Full                                                                  +---------+---------------+---------+-----------+----------+-------------------+  FV Mid    Full                                                                  +---------+---------------+---------+-----------+----------+-------------------+  FV Distal Full                                                                  +---------+---------------+---------+-----------+----------+-------------------+  PFV       Full                                                                  +---------+---------------+---------+-----------+----------+-------------------+  POP       Full            Yes       Yes                                         +---------+---------------+---------+-----------+----------+-------------------+  PTV       Full                                                                  +---------+---------------+---------+-----------+----------+-------------------+  PERO                                                       Not well visualized  +---------+---------------+---------+-----------+----------+-------------------+     Summary: RIGHT: - There is no evidence of deep vein thrombosis in the lower extremity. However, portions of this examination were limited- see technologist comments above.  - No cystic structure found in the popliteal fossa.  LEFT: - There is no evidence of deep vein thrombosis in the lower extremity. However, portions of this examination were limited- see technologist comments above.  - No  cystic structure found in the popliteal fossa.  *See table(s) above for measurements and observations. Electronically signed by Gerarda Fraction on 07/21/2021 at 6:50:30 AM.    Final     PHYSICAL EXAM  Physical Exam  Constitutional: Ill appearing african [place] male   Respiratory: Tracheostomy in  place, respirations synchronous with ventilator  Neuro: Patient has tracheostomy in place. Eyes open inconsistently with oral suctioning, no blink to threat. Pupils 2mm and sluggish, no corneal reflex. No gag reflex and weak cough with endotracheal suctioning. Slight withdrawal to pain in LUE   ASSESSMENT/PLAN Mr. ANDER Gary Stevenson is a 81 y.o. male with  history of prior tobacco use, brain aneurysm, right frontal SDH s/p surgical evacuation in 2012, lung cancer s/p RML lobectomy 01/2009, ETOH abuse, and CKD who is admitted s/p fall 07/19/2021 for resultant right femoral neck fracutre and right acetabular fracture. Initially neurology was consulted for post-operative concern for seizure. A few hours after the procedure, the patient was seen to have high amplitude right upper extremity "shaking" bilaterally that lasted approximately 1 minute in duration as witnessed by nursing. The onset was not witnessed. He has been encephalopathic since after surgery, but was noted to be alert and oriented prior to procedure.  1/12 he underwent a R hip arthroplasty.  1/13 seizure activity was noted.  1/17- transferred to ICU and intubated. 1/18 MRI brain showed stable bilateral frontal subacute subdural hematoma.  1/19 cEEG- BIPDs with some brief electrographic seizures during the initial portion of the recording, indicative of a multifocal epileptogenic disturbance.  1/22- MRI - Extensive acute infarcts in the bilateral posterior circulation. Subtotal involvement of the left pons. Scattered bilateral cerebellar, occipital, and parietal lobe involvement. Cytotoxic edema without acute hemorrhage. Right > left mostly subfrontal  SDH is stable since scan on 07/15/2021.  1/23- Breakthrough seizure activity, discuss with Dr. Melynda Ripple as far as titration of phenobarbital and keppra.  1/24 Unable to perform TEE due to tongue swelling.  Will reassess 1/25 1/25 TEE performed showing no PFO or ASD and filamentous structure on underside of TV, likely chordal apparatus 1/26 EEG on ictal-interictal continuum with low-intermediate potential for seizures 1/27 Plan for PEG today  If family wishes to continue with stroke work up, plan for a loop recorder or 30 day monitor at discharge.   Stroke:  Scattered bilateral cerebellar, occipital, and parietal infarcts with left pontine involvement, etiology not quite clear, could be occult A. Fib vs. hypoperfusion due to hypotension Code Stroke- Stable small bilateral subdural hematomas over the anterior and inferior aspects of the frontal lobes bilaterally not significantly changed from the prior exam. There is mild mass effect on the frontal lobe on the right with no midline shift. Atrophy with chronic microvascular ischemic changes. 1/22/20233- MRI  Extensive acute infarcts in the bilateral posterior circulation. Subtotal involvement of the left pons. Scattered bilateral cerebellar, occipital, and parietal lobe involvement. Cytotoxic edema without acute hemorrhage. Right > left mostly subfrontal SDH is stable since 07/15/2021.  MRA  No LVO, 5 mm wide necked outpouching extending from the cavernous left ICA, suspicious for possible aneurysm - likely not mycotic aneurysm as endocarditis has been ruled out  2D Echo 50-55% Lower extremity venous doppler - no DVT TEE no endocarditis on MV and AV.  There is a filamentous mobile structure on the underside of the TV. Suspect chordal apparatus but cannot completely exclude vegetation.  However, no PFO or ASD.  This would not explain the stroke.  LDL 15.6 HgbA1c 5.7 VTE prophylaxis - SCDs No antithrombotic prior to admission, now on ASA 81. Do not  recommend DAPT given cerebral aneurysm and pending PEG prodedure Therapy recommendations:  Pending Disposition:  Pending  Hypotension Stable now Off pressors Long-term BP goal normotensive  Seizure Developed a seizure after orthopedic surgery -> put on Keppra  Long-term EEG showed possible right hemisphere nonconvulsive status, added phenobarbital after load.  Currently on phenobarbital and levetiracetam  Per CCM, will taper phenobarbital to see if neuro exam improves LTM EEG monitor to ensure no seizure on phenobarbital taper  Lipid management LDL 15.6 , goal < 70 No statin needed given low LDL level  Other Stroke Risk Factors Advanced Age >/= 69  Former smoker ETOH use, alcohol level, 2 drink(s) a day Chronic SDH- now noted to be stable on recent scans  Other Active Problems Respiratory Failure- Hypoxic hypercarbic, s/p trach - CCM on board Acute metabolic encephalopathy AKI on CKD Close displaced fracture of the right femoral neck Status post right hip total arthroplasty on 1/12 by orthopedic surgery.   Severe protein caloric malnutrition, failure to thrive - pending PEG placement Hypoglycemia, resolved Klebsiella, Enterobacter pneumonia Septic shock Completed Abx course   Hospital day # 16  This patient is critically ill due to extensive posterior stroke, nonconvulsive status, respiratory failure, sepsis, septic shock, renal failure and at significant risk of neurological worsening, death form recurrent stroke, status epilepticus, septic shock, heart failure. This patient's care requires constant monitoring of vital signs, hemodynamics, respiratory and cardiac monitoring, review of multiple databases, neurological assessment, discussion with family, other specialists and medical decision making of high complexity. I spent 30 minutes of neurocritical care time in the care of this patient.  Marvel Plan, MD PhD Stroke Neurology 07/24/2021 6:28 PM  To contact Stroke  Continuity provider, please refer to [email or web address]. After hours, contact General Neurology

## 2021-07-24 NOTE — Consult Note (Signed)
Consult Note  Tee Richeson [facility] 10-31-1940  [id number].    Requesting MD: Dr. Shearon Stalls Chief Complaint/Reason for Consult: open gastrostomy tube placement  HPI:  This is an 81 yo black male who was admitted secondary to a fall with a R acetabular fx and underwent a R total hip arthroplasty on 06/30/2021 by ortho.  Since that time he has been found to have seizure activity.  He was on the ventilator and has since been trached.  He is currently minimally responsive under continuous EEG monitoring.  This is felt to be secondary to SDH.  Meetings have been had with CCM and palliative and apparently the step-daughter has wanted full care.  His stomach is posterior to his transverse colon and IR and [place] are unable to do a PEG tube.  He would require an open gastrostomy tube and so we have been consulted.  Patient is comatose on vent and no family at bedside.  History taken from chart.  ROS: ROS: Unable to obtain due to above  History reviewed. No pertinent family history.  Past Medical History:  Diagnosis Date   Arthritis    Brain aneurysm    ETOH abuse    intoxicated   Lung cancer [facility])     Past Surgical History:  Procedure Laterality Date   BRAIN SURGERY     COLONOSCOPY N/A 03/18/2014   Procedure: COLONOSCOPY;  Surgeon: Juanita Craver, MD;  Location: [facility];  Service: Endoscopy;  Laterality: N/A;   ESOPHAGOGASTRODUODENOSCOPY N/A 03/18/2014   Procedure: ESOPHAGOGASTRODUODENOSCOPY (EGD);  Surgeon: Juanita Craver, MD;  Location: [facility];  Service: Endoscopy;  Laterality: N/A;   LUNG SURGERY     TOTAL HIP ARTHROPLASTY Right 07/25/2021   Procedure: TOTAL HIP ARTHROPLASTY ANTERIOR APPROACH;  Surgeon: Rod Can, MD;  Location: [facility];  Service: Orthopedics;  Laterality: Right;    Social History:  reports that he has quit smoking. He has never used smokeless tobacco. He reports current alcohol use. He reports that he does not use drugs.  Allergies: No Known  Allergies  Medications Prior to Admission  Medication Sig Dispense Refill   HYDROcodone-acetaminophen (NORCO/VICODIN) 5-325 MG tablet Take 1 tablet by mouth every 6 (six) hours as needed for moderate pain. 14 tablet 0   naproxen sodium (ALEVE) 220 MG tablet Take 220 mg by mouth daily as needed (pain).     indomethacin (INDOCIN) 50 MG capsule Take 1 capsule (50 mg total) by mouth 2 (two) times daily as needed. (Patient not taking: Reported on 07/03/2021) 10 capsule 0   meloxicam (MOBIC) 15 MG tablet Take 1 tablet (15 mg total) by mouth daily. (Patient not taking: Reported on 07/03/2021) 30 tablet 2    Blood pressure (!) 93/50, pulse 64, temperature (!) 97.3 F (36.3 C), temperature source Oral, resp. rate 14, height 5\' 5"  (1.651 m), weight 43.8 kg, SpO2 100 %. Physical Exam: General: critically ill male laying in bed minimally responsive. HEENT: head covered in EEG leads.  Sclera are noninjected.  Pupils equal and round. EOMs intact. Neck with trach in place Heart: regular, rate, and rhythm.  Normal s1,s2. No obvious murmurs, gallops, or rubs noted.  Palpable radial and pedal pulses bilaterally.  Diffuse peripheral edema Lungs: coarse breath sounds, on vent.  Trach in place Abd: soft, ND, +BS, no masses, hernias, or organomegaly Skin: warm and dry with no masses, lesions, or rashes.  R hip wound showed to me by RN.  Wound has excoriated tissue and some necrotic tissue  below wound.   Neuro: barely withdrawals to pain with left upper wrist only    Results for orders placed or performed during the hospital encounter of 07/25/2021 (from the past 48 hour(s))  Glucose, capillary     Status: None   Collection Time: 07/22/21  3:15 PM  Result Value Ref Range   Glucose-Capillary 76 70 - 99 mg/dL    Comment: Glucose reference range applies only to samples taken after fasting for at least 8 hours.  Glucose, capillary     Status: None   Collection Time: 07/22/21  7:12 PM  Result Value Ref Range    Glucose-Capillary 85 70 - 99 mg/dL    Comment: Glucose reference range applies only to samples taken after fasting for at least 8 hours.  Glucose, capillary     Status: None   Collection Time: 07/22/21  8:17 PM  Result Value Ref Range   Glucose-Capillary 88 70 - 99 mg/dL    Comment: Glucose reference range applies only to samples taken after fasting for at least 8 hours.  Glucose, capillary     Status: None   Collection Time: 07/23/21 12:07 AM  Result Value Ref Range   Glucose-Capillary 88 70 - 99 mg/dL    Comment: Glucose reference range applies only to samples taken after fasting for at least 8 hours.  Basic metabolic panel     Status: Abnormal   Collection Time: 07/23/21  3:18 AM  Result Value Ref Range   Sodium 137 135 - 145 mmol/L   Potassium 3.8 3.5 - 5.1 mmol/L   Chloride 105 98 - 111 mmol/L   CO2 23 22 - 32 mmol/L   Glucose, Bld 88 70 - 99 mg/dL    Comment: Glucose reference range applies only to samples taken after fasting for at least 8 hours.   BUN 66 (H) 8 - 23 mg/dL   Creatinine, Ser 1.91 (H) 0.61 - 1.24 mg/dL   Calcium 7.2 (L) 8.9 - 10.3 mg/dL   GFR, Estimated 35 (L) >60 mL/min    Comment: (NOTE) Calculated using the CKD-EPI Creatinine Equation (2021)    Anion gap 9 5 - 15    Comment: Performed at [facility] [address], [place], [place] [address]  CBC     Status: Abnormal   Collection Time: 07/23/21  3:18 AM  Result Value Ref Range   WBC 15.3 (H) 4.0 - 10.5 K/uL   RBC 2.77 (L) 4.22 - 5.81 MIL/uL   Hemoglobin 8.2 (L) 13.0 - 17.0 g/dL   HCT 23.0 (L) 39.0 - 52.0 %   MCV 83.0 80.0 - 100.0 fL   MCH 29.6 26.0 - 34.0 pg   MCHC 35.7 30.0 - 36.0 g/dL   RDW 17.2 (H) 11.5 - 15.5 %   Platelets 183 150 - 400 K/uL   nRBC 0.5 (H) 0.0 - 0.2 %    Comment: Performed at [facility] [address], [place], [place] [address]  Magnesium     Status: None   Collection Time: 07/23/21  3:18 AM  Result Value Ref Range   Magnesium 2.2 1.7 - 2.4 mg/dL     Comment: Performed at [facility] [address], [place], [place] [address]  Phosphorus     Status: None   Collection Time: 07/23/21  3:18 AM  Result Value Ref Range   Phosphorus 3.1 2.5 - 4.6 mg/dL    Comment: Performed at [facility]  [address] [place], [place] [address]  LDL cholesterol, direct     Status: None   Collection Time: 07/23/21  3:18 AM  Result Value Ref Range   Direct LDL 15.6 0 - 99 mg/dL    Comment: Performed at [facility] [address] [place], [place] [address]  Glucose, capillary     Status: None   Collection Time: 07/23/21  3:22 AM  Result Value Ref Range   Glucose-Capillary 78 70 - 99 mg/dL    Comment: Glucose reference range applies only to samples taken after fasting for at least 8 hours.  Glucose, capillary     Status: None   Collection Time: 07/23/21  7:21 AM  Result Value Ref Range   Glucose-Capillary 72 70 - 99 mg/dL    Comment: Glucose reference range applies only to samples taken after fasting for at least 8 hours.  Glucose, capillary     Status: None   Collection Time: 07/23/21 11:16 AM  Result Value Ref Range   Glucose-Capillary 87 70 - 99 mg/dL    Comment: Glucose reference range applies only to samples taken after fasting for at least 8 hours.  Glucose, capillary     Status: Abnormal   Collection Time: 07/23/21  3:31 PM  Result Value Ref Range   Glucose-Capillary 106 (H) 70 - 99 mg/dL    Comment: Glucose reference range applies only to samples taken after fasting for at least 8 hours.  Basic metabolic panel     Status: Abnormal   Collection Time: 07/23/21  6:00 PM  Result Value Ref Range   Sodium 138 135 - 145 mmol/L   Potassium 4.3 3.5 - 5.1 mmol/L   Chloride 107 98 - 111 mmol/L   CO2 22 22 - 32 mmol/L   Glucose, Bld 103 (H) 70 - 99 mg/dL    Comment: Glucose reference range applies only to samples taken after fasting for at least 8 hours.   BUN 67 (H) 8 - 23 mg/dL   Creatinine, Ser 2.16 (H) 0.61 - 1.24 mg/dL    Calcium 7.2 (L) 8.9 - 10.3 mg/dL   GFR, Estimated 30 (L) >60 mL/min    Comment: (NOTE) Calculated using the CKD-EPI Creatinine Equation (2021)    Anion gap 9 5 - 15    Comment: Performed at [facility] [address], [place], [place] [address]  Glucose, capillary     Status: None   Collection Time: 07/23/21  7:16 PM  Result Value Ref Range   Glucose-Capillary 96 70 - 99 mg/dL    Comment: Glucose reference range applies only to samples taken after fasting for at least 8 hours.  Magnesium     Status: None   Collection Time: 07/23/21  8:03 PM  Result Value Ref Range   Magnesium 2.0 1.7 - 2.4 mg/dL    Comment: Performed at [facility], [place] [address] [place], [place] [address]  TSH     Status: Abnormal   Collection Time: 07/23/21  8:15 PM  Result Value Ref Range   TSH 5.942 (H) 0.350 - 4.500 uIU/mL    Comment: Performed by a 3rd Generation assay with a functional sensitivity of <=0.01 uIU/mL. Performed at [facility], [place] [address] [place], [place] [address]   Glucose, capillary     Status: Abnormal   Collection Time: 07/23/21 11:10 PM  Result Value Ref Range   Glucose-Capillary 108 (H) 70 - 99 mg/dL    Comment: Glucose reference  range applies only to samples taken after fasting for at least 8 hours.  Glucose, capillary     Status: None   Collection Time: 07/24/21  3:16 AM  Result Value Ref Range   Glucose-Capillary 77 70 - 99 mg/dL    Comment: Glucose reference range applies only to samples taken after fasting for at least 8 hours.  Glucose, capillary     Status: None   Collection Time: 07/24/21  4:18 AM  Result Value Ref Range   Glucose-Capillary 96 70 - 99 mg/dL    Comment: Glucose reference range applies only to samples taken after fasting for at least 8 hours.  Basic metabolic panel     Status: Abnormal   Collection Time: 07/24/21  4:35 AM  Result Value Ref Range   Sodium 137 135 - 145 mmol/L   Potassium 4.9 3.5 - 5.1 mmol/L   Chloride 108 98  - 111 mmol/L   CO2 21 (L) 22 - 32 mmol/L   Glucose, Bld 102 (H) 70 - 99 mg/dL    Comment: Glucose reference range applies only to samples taken after fasting for at least 8 hours.   BUN 67 (H) 8 - 23 mg/dL   Creatinine, Ser 2.16 (H) 0.61 - 1.24 mg/dL   Calcium 7.2 (L) 8.9 - 10.3 mg/dL   GFR, Estimated 30 (L) >60 mL/min    Comment: (NOTE) Calculated using the CKD-EPI Creatinine Equation (2021)    Anion gap 8 5 - 15    Comment: Performed at [facility] [address] [place], [place] [address]  CBC     Status: Abnormal   Collection Time: 07/24/21  4:35 AM  Result Value Ref Range   WBC 13.9 (H) 4.0 - 10.5 K/uL   RBC 2.65 (L) 4.22 - 5.81 MIL/uL   Hemoglobin 7.6 (L) 13.0 - 17.0 g/dL   HCT 22.6 (L) 39.0 - 52.0 %   MCV 85.3 80.0 - 100.0 fL   MCH 28.7 26.0 - 34.0 pg   MCHC 33.6 30.0 - 36.0 g/dL   RDW 17.3 (H) 11.5 - 15.5 %   Platelets 214 150 - 400 K/uL   nRBC 0.4 (H) 0.0 - 0.2 %    Comment: Performed at [facility] [address] [place], [place] [address]  Magnesium     Status: None   Collection Time: 07/24/21  4:35 AM  Result Value Ref Range   Magnesium 2.2 1.7 - 2.4 mg/dL    Comment: Performed at [facility] [address] [place], [place] [address]  Phosphorus     Status: None   Collection Time: 07/24/21  4:35 AM  Result Value Ref Range   Phosphorus 3.2 2.5 - 4.6 mg/dL    Comment: Performed at [facility] [address] [place], [place] [address]  Glucose, capillary     Status: None   Collection Time: 07/24/21  7:24 AM  Result Value Ref Range   Glucose-Capillary 89 70 - 99 mg/dL    Comment: Glucose reference range applies only to samples taken after fasting for at least 8 hours.  Glucose, capillary     Status: None   Collection Time: 07/24/21 11:12 AM  Result Value Ref Range   Glucose-Capillary 96 70 - 99 mg/dL    Comment: Glucose reference range applies only to samples taken after fasting for at least 8 hours.   CT ABDOMEN WO  CONTRAST  Result Date: 07/24/2021 CLINICAL DATA:  Evaluate anatomy  for potential G-tube. EXAM: CT ABDOMEN WITHOUT CONTRAST TECHNIQUE: Multidetector CT imaging of the abdomen was performed following the standard protocol without IV contrast. RADIATION DOSE REDUCTION: This exam was performed according to the departmental dose-optimization program which includes automated exposure control, adjustment of the mA and/or kV according to patient size and/or use of iterative reconstruction technique. COMPARISON:  PET-CT 01/09/2009 FINDINGS: Lower chest: Trace right pleural fluid and at least a small left pleural effusion. There is extensive consolidation in the left lower lobe. Patchy airspace disease throughout the right lower lung with postsurgical changes in the right lung. Hepatobiliary: Trace perihepatic ascites. High-density material layering in the gallbladder is suggestive for cholelithiasis. No gross abnormality to the liver. Pancreas: Unremarkable. No pancreatic ductal dilatation or surrounding inflammatory changes. Spleen: Normal in size without focal abnormality. Adrenals/Urinary Tract: Normal appearance of the adrenal glands. Normal appearance of both kidneys without stones or hydronephrosis. Calcifications in the right renal hilum are probably vascular in etiology. Stomach/Bowel: Nasogastric feeding tube extends into the stomach and terminates in the proximal duodenum. There is a large amount of colon anterior to the stomach. There is no significant bowel distension. Limited evaluation for bowel inflammation. Vascular/Lymphatic: Diffuse atherosclerotic calcifications in the aorta and iliac arteries. Negative for an abdominal aortic aneurysm. Other: Trace perihepatic ascites. There may be trace ascites in the left upper quadrant of the abdomen. Negative for free air. Evidence for diffuse subcutaneous edema. Musculoskeletal: Compression fractures involving L1, L4 and L5 of unknown age. IMPRESSION: 1. Transverse  colon is located between the anterior abdominal wall and the stomach. The colon location could preclude placement of a percutaneous gastrostomy tube. 2. Trace upper abdominal ascites. 3. Extensive airspace disease in the right lower lung and a large amount of consolidation in the left lower lobe. Bilateral pleural effusions. Findings are concerning for bilateral pneumonia. 4. Lumbar vertebral body compression fractures of unknown age. Electronically Signed   By: Markus Daft M.D.   On: 07/24/2021 08:31   DG Chest Port 1 View  Result Date: 07/23/2021 CLINICAL DATA:  Tracheostomy placement EXAM: PORTABLE CHEST 1 VIEW COMPARISON:  07/22/2021 FINDINGS: Interval removal of ET tube and placement of tracheostomy tube which appears appropriately position. Previously seen small bore enteric tube has been removed. Large bore feeding tube remains in place with distal tip extending beyond the inferior margin of the film. Normal heart size. Persistent patchy bibasilar opacities, right worse than left. Small bilateral pleural effusions, left greater than right. No pneumothorax. IMPRESSION: 1. Interval placement of tracheostomy tube. 2. Persistent patchy bibasilar opacities and small bilateral pleural effusions. Electronically Signed   By: Davina Poke D.O.   On: 07/23/2021 12:48   Overnight EEG with video  Result Date: 07/23/2021 Lora Havens, MD     07/24/2021  9:37 AM Patient Name: THEON SOBOTKA MRN: [id number] Epilepsy Attending: Lora Havens Referring Physician/Provider: Dr Rosalin Hawking Duration: 07/22/2021 1608 to 07/23/2021 1608  Patient history: 81yo m with right SDH, now with seizure like activity. EEG to evaluate for seizure  Level of alertness: comatose  AEDs during EEG study: LEV, phenobarb  Technical aspects: This EEG study was done with scalp electrodes positioned according to the 10-20 International system of electrode placement. Electrical activity was acquired at a sampling rate of 500Hz  and reviewed  with a high frequency filter of 70Hz  and a low frequency filter of 1Hz . EEG data were recorded continuously and digitally stored.  Description: No posterior dominant rhythm was seen.  EEG showed continuous generalized  and lateralized right hemisphere 3 to 6 Hz theta-delta slowing admixed with intermittent generalized 15 to 18 Hz beta activity.  Bilaterally independent periodic discharges with epileptiform morphology were also seen in left and right hemisphere intermittently at 1 to 2 Hz, more prominent when awake/stimulated.  Hyperventilation and photic stimulation were not performed.   ABNORMALITY - Bilateral independent periodic discharges, left and right hemisphere - Continuous slow, generalized and lateralized right hemisphere  IMPRESSION: This study showed evidence of of independent epileptogenicity arising from left and right hemisphere.  This EEG pattern is on the ictal-interictal continuum with low to intermediate potential for seizures.  Additionally there is evidence of moderate to severe diffuse encephalopathy, nonspecific etiology.  No definite seizures were seen during the study. Priyanka O Yadav    VAS [place] LOWER EXTREMITY VENOUS (DVT)  Result Date: 07/21/2021  Lower Venous DVT Study Patient Name:  BEREKET GERNERT  Date of Exam:   07/20/2021 Medical Rec #: [id number]       Accession #:    [id number] Date of Birth: [birth year]       Patient Gender: M Patient Age:   14 years Exam Location:  [facility] Procedure:      VAS [place] LOWER EXTREMITY VENOUS (DVT) Referring Phys: Cornelius Moras XU --------------------------------------------------------------------------------  Indications: Stroke.  Risk Factors: None identified. Limitations: Poor ultrasound/tissue interface and patient positioning, patient immobility. Comparison Study: No prior studies. Performing Technologist: Oliver Hum RVT  Examination Guidelines: A complete evaluation includes B-mode imaging, spectral Doppler, color Doppler, and power  Doppler as needed of all accessible portions of each vessel. Bilateral testing is considered an integral part of a complete examination. Limited examinations for reoccurring indications may be performed as noted. The reflux portion of the exam is performed with the patient in reverse Trendelenburg.  +---------+---------------+---------+-----------+----------+-------------------+  RIGHT     Compressibility Phasicity Spontaneity Properties Thrombus Aging       +---------+---------------+---------+-----------+----------+-------------------+  CFV       Full            Yes       Yes                                         +---------+---------------+---------+-----------+----------+-------------------+  SFJ       Full                                                                  +---------+---------------+---------+-----------+----------+-------------------+  FV Prox   Full                                                                  +---------+---------------+---------+-----------+----------+-------------------+  FV Mid    Full                                                                  +---------+---------------+---------+-----------+----------+-------------------+  FV Distal Full                                                                  +---------+---------------+---------+-----------+----------+-------------------+  PFV       Full                                                                  +---------+---------------+---------+-----------+----------+-------------------+  POP       Full            Yes       Yes                                         +---------+---------------+---------+-----------+----------+-------------------+  PTV       Full                                                                  +---------+---------------+---------+-----------+----------+-------------------+  PERO                                                       Not well visualized   +---------+---------------+---------+-----------+----------+-------------------+   +---------+---------------+---------+-----------+----------+-------------------+  LEFT      Compressibility Phasicity Spontaneity Properties Thrombus Aging       +---------+---------------+---------+-----------+----------+-------------------+  CFV       Full            Yes       Yes                                         +---------+---------------+---------+-----------+----------+-------------------+  SFJ       Full                                                                  +---------+---------------+---------+-----------+----------+-------------------+  FV Prox   Full                                                                  +---------+---------------+---------+-----------+----------+-------------------+  FV Mid    Full                                                                  +---------+---------------+---------+-----------+----------+-------------------+  FV Distal Full                                                                  +---------+---------------+---------+-----------+----------+-------------------+  PFV       Full                                                                  +---------+---------------+---------+-----------+----------+-------------------+  POP       Full            Yes       Yes                                         +---------+---------------+---------+-----------+----------+-------------------+  PTV       Full                                                                  +---------+---------------+---------+-----------+----------+-------------------+  PERO                                                       Not well visualized  +---------+---------------+---------+-----------+----------+-------------------+     Summary: RIGHT: - There is no evidence of deep vein thrombosis in the lower extremity. However, portions of this examination were limited- see technologist  comments above.  - No cystic structure found in the popliteal fossa.  LEFT: - There is no evidence of deep vein thrombosis in the lower extremity. However, portions of this examination were limited- see technologist comments above.  - No cystic structure found in the popliteal fossa.  *See table(s) above for measurements and observations. Electronically signed by Orlie Pollen on 07/21/2021 at 6:50:30 AM.    Final       Assessment/Plan Fall with R acetab fx s/p total hip arthoplasty 1/12 -benefit from ortho re-evaluating his wound Consult for open g-tube placement The patient has been seen, imaging reviewed, as well as the rest of his chart.  The patient is critically ill and currently minimally responsive secondary to previous seizure activity.  He is currently not on any sedation medications.  This patient would be extremely high risk for surgical intervention including a general anesthetic.  I would argue strongly against surgical intervention for a gastromy tube placement in this patient.  Unfortunately, his family member is not at bedside to discuss this with her.  We will try to contact her to discuss this situation.  I have reached out to the primary teach to discuss his care as well.  FEN: TFs BT:DVVO [id number]:[id number]  Acute metabolic/anoxic encephalopathy Acute hypoxemic and hypercapnic respiratory  failure S/p Tracheostomy 1/26 right frontal subdural hematomas status post surgical evacuation in 2012 lung cancer status post lobectomy alcohol abuse CKD    This care required moderate level of medical decision making.   Henreitta Cea, [facility] 07/24/2021, 2:12 PM Please see Amion for pager number during day hours 7:00am-4:30pm

## 2021-07-24 NOTE — Progress Notes (Signed)
LTM maintain done at bedside. No skin breakdown noted. Impedances good. EKG moved to ensure better signal. Results pending.

## 2021-07-24 NOTE — Progress Notes (Addendum)
NAME:  Gary Stevenson, MRN:  [id number], DOB:  05-Feb-1941, LOS: 87 ADMISSION DATE:  07/18/2021, CONSULTATION DATE: 07/14/2021 REFERRING MD:  Marlyce Huge, CHIEF COMPLAINT:  AMS, Respiratory failure   History of Present Illness:  81 y.o. M with PMH significant for right frontal subdural hematomas status post surgical evacuation in 2012, lung cancer status post lobectomy, alcohol abuse, CKD who presented to the ED 1/12 after a fall and was found to have R acetabular fx with interval displacement and underwent R total hip arthroplasty on 07/03/2021.  He was later found with decreased responsiveness and seizure activity that lasted for around 1 minute.  He was seen by Neurology and started on Phenobarbital and Keppra with LTM EEG.  Neurology felt seizure was most likely secondary to subdural hematoma and he was gradually improving until 1/17 when he was noted to have worsening mental status again with hypotension.  ABG 7.134 and pCO2 60.7, patient's mental status was not amenable for Bipap, so PCCM consulted for transfer. ICU course complicated by prolonged respiratory failure requiring tracheostomy, continuous EEG for seizure monitoring, intermittent vasopressor use & AFwRVR.   Pertinent  Medical History  Arthritis  Brain Aneurysm  ETOH Abuse  CKD  Lung Cancer  Significant Hospital Events: Including procedures, antibiotic start and stop dates in addition to other pertinent events   1/11 admission for R hip fx 1/12 R hip arthoplasty 1/13 Sz activity 1/17 worsening mental status, PCCM consult and ICU txfr, intubated  1/18 CT head with stable small bilateral subdural hematomas but no acute intracranial abnormalities. 1/18 EEG abnormal comatose EEG, no epileptiform discharges 1/18 MRI brain stable bilateral frontal subacute subdural hematoma.  No new intracranial abnormalities. 1/19 CXR stable multilobar pneumonia, resolved RUL collapse, continued LLL consolidation/collapse 1/19 cEEG with BIPDs with some  brief electrographic seizures during the initial portion of the recording, indicative of a multifocal epileptogenic disturbance. Seizures were improved in the afternoon and evening 1/21 worsening shock, acidosis.  Abx changed to Vanco + meropenem.  Made DNR after family meeting 1/23 palliative met with family and seems that family wants full scope of offered tx, continue DNR  1/26 cEEG while weaning phenobarbital, remains comatose   Interim History / Subjective:  Remains on cEEG Afebrile Started on levo overnight, had AFwRVR post s/p amio bolus   Objective   Blood pressure 103/61, pulse 73, temperature (!) 97.3 F (36.3 C), temperature source Oral, resp. rate 19, height '5\' 5"'  (1.651 m), weight 43.8 kg, SpO2 100 %.    Vent Mode: CPAP;PSV FiO2 (%):  [40 %] 40 % Set Rate:  [28 bmp] 28 bmp Vt Set:  [490 mL] 490 mL PEEP:  [5 cmH20] 5 cmH20 Pressure Support:  [12 cmH20] 12 cmH20 Plateau Pressure:  [20 cmH20-27 cmH20] 20 cmH20   Intake/Output Summary (Last 24 hours) at 07/24/2021 0745 Last data filed at 07/24/2021 0700 Gross per 24 hour  Intake 3088.85 ml  Output 1125 ml  Net 1963.85 ml   Filed Weights   07/20/21 0400 07/20/21 0500  Weight: 43.8 kg 43.8 kg    Exam: General: chronically ill appearing adult male lying in bed in NAD on vent  HEENT: MM pink/moist, trach with bloody secretions from site, EEG leads in place, anicteric, pupils 38m, temporal wasting   Neuro: eyes midline, eyes closed, slight eye opening when name called, slight withdrawal to pain CV: s1s2 RRR, SR 70's on monitor, no m/r/g PULM:  non-labored at rest, lungs coarse with rhonchi bilaterally, bloody secretions from trach GI:  soft, bsx4 active  Extremities: warm/dry, generalized UE 3+ edema  Skin: no rashes or lesions  Resolved Hospital Problem list   Hypernatremia Respiratory acidosis Septic shock  Ileus  Assessment & Plan:   Acute Metabolic Encephalopathy - multifactorial  Bilateral infarcts involving  cerebellar, occipital, and parietal regions with L pontine involvement, with cytotoxic edema  Chronic SDH L ICA aneurysm r/o mycotic Hx seizures -continuous EEG with phenobarbital weaning per Neurology  -continue antipleptics  -seizuer precautions  -DAPT once PEG completed, consider starting 1/28 if no bleeding  -continue ASA 14m  -follow serial neuro exam   Acute hypoxic and hypercarbic respiratory failure  PNA due to klebsiella pneumoniae and enterobacter cloacae   Intubated 1/17, trach placed 1/26. Completed Mero 1/25.  -PRVC 8cc/kg  -wean PEEP / FiO2 for sats >90% -mental status barrier to weaning at this point  -trach care per protocol  -clip trach sutures 7-10 days post placement (1/26) -family wants to pursue PEG / LTAC placement   Hypotension  AFwRVR  S/p amio bolus 1/26, returned to NSR  -hold further lasix  -wean levophed to off, take off MAR once weaned -albumin x1 -tele monitoring   AKI on CKD IIIa  Hypoglycemia  Metabolic acidosis related to GI losses, AKI on CKD   Volume overload/third spacing -D10 while NPO -hold lasix 1/27 with hypotension requiring pressors  -albumin 50g x1  -Trend BMP / urinary output -Replace electrolytes as indicated -Avoid nephrotoxic agents, ensure adequate renal perfusion  Anemia  Baseline Hgb 8-10 -trend CBC  -transfuse for Hgb <7%  Severe protein calorie malnutrition FTT in Adult -NPO for now until PEG placed, then resume TF when able  -PEG placement pending 1/26  -continue MVI, thiamine  Displaced R femoral neck fracture, R acetabulum fx , R femoral head impaction fx S/p R total hip arthroplasty 1/12 -local care to site  -PT when mental status permits   DNR Status Goals of Care -multiple conversations with family.  DNR but full medical care otherwise.    Best Practice (right click and "Reselect all SmartList Selections" daily)  Diet/type: NPO, TF  DVT prophylaxis: heparin GI prophylaxis: PPI Lines: N/A Foley:   N/A Code Status: DNR Last date of multidisciplinary goals of care discussion: per Dr. STamala Julian1/25. No change in goals of care. Daughter updated at bedside 1/27 on plan of care. LTAC referral.    Critical Care Time: 321minutes   BNoe Gens MSN, APRN, NP-C, AGACNP-BC [facility] 07/24/2021, 9:06 AM   Please see [email or web address] for pager details.   From 7A-7P if no response, please call [phone] After hours, please call ELink [phone]

## 2021-07-24 NOTE — Progress Notes (Signed)
PT Cancellation Note  Patient Details Name: Gary Stevenson MRN: [id number] DOB: July 07, 1940   Cancelled Treatment:    Reason Eval/Treat Not Completed: Patient not medically ready (pt with post trach order set. Pt remains on vent and unresponsive without current need for acute services. Discussed with RN and will sign off. Please reorder as pt is able to participate)   Renardo Cheatum B Yarelli Decelles 07/24/2021, 8:02 AM Bayard Males, PT Acute Rehabilitation Services Pager: [phone] Office: [phone]

## 2021-07-24 NOTE — Progress Notes (Addendum)
LTM maint complete - skin breakdown under:   Fp1 F3-nurse informed [facility] monitored, Event button test confirmed by [facility].

## 2021-07-25 DIAGNOSIS — S72001A Fracture of unspecified part of neck of right femur, initial encounter for closed fracture: Secondary | ICD-10-CM | POA: Diagnosis not present

## 2021-07-25 DIAGNOSIS — G931 Anoxic brain damage, not elsewhere classified: Secondary | ICD-10-CM | POA: Diagnosis not present

## 2021-07-25 DIAGNOSIS — E43 Unspecified severe protein-calorie malnutrition: Secondary | ICD-10-CM | POA: Diagnosis not present

## 2021-07-25 DIAGNOSIS — J9601 Acute respiratory failure with hypoxia: Secondary | ICD-10-CM | POA: Diagnosis not present

## 2021-07-25 LAB — CBC
HCT: 20.9 % — ABNORMAL LOW (ref 39.0–52.0)
Hemoglobin: 7.1 g/dL — ABNORMAL LOW (ref 13.0–17.0)
MCH: 28.9 pg (ref 26.0–34.0)
MCHC: 34 g/dL (ref 30.0–36.0)
MCV: 85 fL (ref 80.0–100.0)
Platelets: 195 10*3/uL (ref 150–400)
RBC: 2.46 MIL/uL — ABNORMAL LOW (ref 4.22–5.81)
RDW: 17.3 % — ABNORMAL HIGH (ref 11.5–15.5)
WBC: 12.1 10*3/uL — ABNORMAL HIGH (ref 4.0–10.5)
nRBC: 0.2 % (ref 0.0–0.2)

## 2021-07-25 LAB — GLUCOSE, CAPILLARY
Glucose-Capillary: 92 mg/dL (ref 70–99)
Glucose-Capillary: 86 mg/dL (ref 70–99)
Glucose-Capillary: 71 mg/dL (ref 70–99)
Glucose-Capillary: 80 mg/dL (ref 70–99)
Glucose-Capillary: 88 mg/dL (ref 70–99)
Glucose-Capillary: 80 mg/dL (ref 70–99)

## 2021-07-25 LAB — BASIC METABOLIC PANEL
Anion gap: 8 (ref 5–15)
BUN: 67 mg/dL — ABNORMAL HIGH (ref 8–23)
CO2: 20 mmol/L — ABNORMAL LOW (ref 22–32)
Calcium: 7.5 mg/dL — ABNORMAL LOW (ref 8.9–10.3)
Chloride: 108 mmol/L (ref 98–111)
Creatinine, Ser: 1.96 mg/dL — ABNORMAL HIGH (ref 0.61–1.24)
GFR, Estimated: 34 mL/min — ABNORMAL LOW (ref >60)
Glucose, Bld: 103 mg/dL — ABNORMAL HIGH (ref 70–99)
Potassium: 4.4 mmol/L (ref 3.5–5.1)
Sodium: 136 mmol/L (ref 135–145)

## 2021-07-25 LAB — MAGNESIUM: Magnesium: 2.1 mg/dL (ref 1.7–2.4)

## 2021-07-25 LAB — PHOSPHORUS: Phosphorus: 3.4 mg/dL (ref 2.5–4.6)

## 2021-07-25 MED ORDER — FUROSEMIDE 10 MG/ML IJ SOLN
80.0000 mg | Freq: Once | INTRAMUSCULAR | Status: AC
  Administered 2021-07-25: 80 mg via INTRAVENOUS
  Filled 2021-07-25: qty 8

## 2021-07-25 MED ORDER — CHLORHEXIDINE GLUCONATE CLOTH 2 % EX PADS
6.0000 | MEDICATED_PAD | Freq: Every day | CUTANEOUS | Status: DC
  Administered 2021-07-25: 6 via TOPICAL

## 2021-07-25 NOTE — Procedures (Addendum)
Patient Name: Gary Stevenson  MRN: [id number]  Epilepsy Attending: Lora Havens  Referring Physician/Provider: Dr Rosalin Hawking Duration: 07/24/2021 1608 to 07/25/2021 1608    Patient history: 81yo m with right SDH, now with seizure like activity. EEG to evaluate for seizure   Level of alertness: awake/lethargic, asleep   AEDs during EEG study: LEV, phenobarb   Technical aspects: This EEG study was done with scalp electrodes positioned according to the 10-20 International system of electrode placement. Electrical activity was acquired at a sampling rate of 500Hz  and reviewed with a high frequency filter of 70Hz  and a low frequency filter of 1Hz . EEG data were recorded continuously and digitally stored.    Description: No posterior dominant rhythm was seen.  Sleep was characterized by sleep spindles (12 to 14 Hz), maximum frontocentral region.  EEG showed continuous generalized and lateralized right hemisphere 3 to 6 Hz theta-delta slowing admixed with intermittent generalized 15 to 18 Hz beta activity. EEG showed periodic discharges which at times were generalized and at times looked independent from left and right hemisphere, at 1 to 2 Hz, more prominent when awake/stimulated.  Hyperventilation and photic stimulation were not performed.     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 This EEG pattern is on the ictal-interictal continuum with low to intermediate potential for seizures. Additionally there is evidence of moderate to severe diffuse encephalopathy, nonspecific etiology.  No definite seizures were seen during the study.   Gary Stevenson

## 2021-07-25 NOTE — Progress Notes (Signed)
LTM maint complete - - scabbed over skin breakdown near Fp1 F3-no skin breakdown under aforementioned leads. No breakdown under Fp2 F4 F8 [facility] monitored, Event button test confirmed by [facility].

## 2021-07-25 NOTE — Progress Notes (Signed)
LTM maintain done at bedside. No skin breakdown noted. Results pending.

## 2021-07-25 NOTE — Progress Notes (Signed)
NAME:  Gary Stevenson, MRN:  [id number], DOB:  1941/05/10, LOS: 78 ADMISSION DATE:  07/05/2021, CONSULTATION DATE: 07/14/2021 REFERRING MD:  Marlyce Huge, CHIEF COMPLAINT:  AMS, Respiratory failure   History of Present Illness:  81 y.o. M with PMH significant for right frontal subdural hematomas status post surgical evacuation in 2012, lung cancer status post lobectomy, alcohol abuse, CKD who presented to the ED 1/12 after a fall and was found to have R acetabular fx with interval displacement and underwent R total hip arthroplasty on 06/30/2021.  He was later found with decreased responsiveness and seizure activity that lasted for around 1 minute.  He was seen by Neurology and started on Phenobarbital and Keppra with LTM EEG.  Neurology felt seizure was most likely secondary to subdural hematoma and he was gradually improving until 1/17 when he was noted to have worsening mental status again with hypotension.  ABG 7.134 and pCO2 60.7, patient's mental status was not amenable for Bipap, so PCCM consulted for transfer. ICU course complicated by prolonged respiratory failure requiring tracheostomy, continuous EEG for seizure monitoring, intermittent vasopressor use & AFwRVR.   Pertinent  Medical History  Arthritis  Brain Aneurysm  ETOH Abuse  CKD  Lung Cancer  Significant Hospital Events: Including procedures, antibiotic start and stop dates in addition to other pertinent events   1/11 admission for R hip fx 1/12 R hip arthoplasty 1/13 Sz activity 1/17 worsening mental status, PCCM consult and ICU txfr, intubated  1/18 CT head with stable small bilateral subdural hematomas but no acute intracranial abnormalities. 1/18 EEG abnormal comatose EEG, no epileptiform discharges 1/18 MRI brain stable bilateral frontal subacute subdural hematoma.  No new intracranial abnormalities. 1/19 CXR stable multilobar pneumonia, resolved RUL collapse, continued LLL consolidation/collapse 1/19 cEEG with BIPDs with some  brief electrographic seizures during the initial portion of the recording, indicative of a multifocal epileptogenic disturbance. Seizures were improved in the afternoon and evening 1/21 worsening shock, acidosis.  Abx changed to Vanco + meropenem.  Made DNR after family meeting 1/23 palliative met with family and seems that family wants full scope of offered tx, continue DNR  1/26 cEEG while weaning phenobarbital, remains comatose  1/27 GI and surgery unable to place PEG  Interim History / Subjective:  No overnight issues  Objective   Blood pressure (!) 103/54, pulse 84, temperature (!) 96.3 F (35.7 C), temperature source Axillary, resp. rate 19, height _0  (1.651 m), weight 62.6 kg, SpO2 100 %.    Vent Mode: CPAP;PSV FiO2 (%):  [40 %] 40 % Set Rate:  [28 bmp] 28 bmp Vt Set:  [490 mL] 490 mL PEEP:  [5 cmH20] 5 cmH20 Pressure Support:  [15 cmH20] 15 cmH20 Plateau Pressure:  [22 OEV03-50 cmH20] 23 cmH20   Intake/Output Summary (Last 24 hours) at 07/25/2021 1254 Last data filed at 07/25/2021 1100 Gross per 24 hour  Intake 1712.67 ml  Output 625 ml  Net 1087.67 ml   Filed Weights   07/25/21 0218  Weight: 62.6 kg    Exam: Chronically ill appearing, cachectic,  EEG leads in place RRR no mrg Lungs are generally clear, sounds of mechanical ventilation auscultated Decreased muscle bulk and tone. Extremities thin He is not responsive to verbal or tactile stimuli He does breath over the vent  Resolved Hospital Problem list   Hypernatremia Respiratory acidosis Septic shock  Ileus AF with RVR  Assessment & Plan:   Acute Metabolic Encephalopathy - multifactorial  Bilateral infarcts involving cerebellar, occipital, and parietal regions  with L pontine involvement, with cytotoxic edema  Chronic SDH L ICA aneurysm r/o mycotic Hx seizures -continuous EEG with phenobarbital weaning per Neurology  -continue  antipleptics  -seizure precautions  -DAPT once PEG completed, consider  starting 1/28 if no bleeding  -continue ASA 40m  -follow serial neuro exam   Acute hypoxic and hypercarbic respiratory failure  PNA due to klebsiella pneumoniae and enterobacter cloacae   Intubated 1/17, trach placed 1/26. Completed Mero 1/25.  -PRVC 8cc/kg  -wean PEEP / FiO2 for sats >90% -mental status barrier to weaning at this point  -trach care per protocol  -clip trach sutures 7-10 days post placement (1/26) -family wants to pursue PEG / LTAC placement  - will diurese with IV lasix x 1  AKI on CKD IIIa  Hypoglycemia  Metabolic acidosis related to GI losses, AKI on CKD   Volume overload/third spacing -D10 while NPO -Trend BMP / urinary output -Replace electrolytes as indicated -Avoid nephrotoxic agents, ensure adequate renal perfusion - will diurese with IV lasix x 1  Anemia  Baseline Hgb 8-10 -trend CBC  -transfuse for Hgb <7%  Severe protein calorie malnutrition FTT in Adult - NPO, on D10 for hypoglycemia - unable to receive peg due to anatomy -continue MVI, thiamine  Displaced R femoral neck fracture, R acetabulum fx , R femoral head impaction fx S/p R total hip arthroplasty 1/12 -local care to site  -PT when mental status permits   DNR Status Goals of Care -multiple conversations with family.  DNR but full medical care otherwise.   - unclear what his disposition will be if we are not able to provide nutrition for him.  Best Practice (right click and "Reselect all SmartList Selections" daily)  Diet/type: NPO, TF  DVT prophylaxis: heparin GI prophylaxis: PPI Lines: N/A Foley:  N/A Code Status: DNR Last date of multidisciplinary goals of care discussion: updated by B. Ollis NP over the phone 1/27. Will contact again today.    Critical Care Time: 31 minutes   The patient is critically ill due to respiratory failure, encephalopathy, .  Critical care was necessary to treat or prevent imminent or life-threatening deterioration.  Critical care was time  spent personally by me on the following activities: development of treatment plan with patient and/or surrogate as well as nursing, discussions with consultants, evaluation of patient's response to treatment, examination of patient, obtaining history from patient or surrogate, ordering and performing treatments and interventions, ordering and review of laboratory studies, ordering and review of radiographic studies, pulse oximetry, re-evaluation of patient's condition and participation in multidisciplinary rounds.   Critical Care Time devoted to patient care services described in this note is 31 minutes. This time reflects time of care of this [place]. This critical care time does not reflect separately billable procedures or procedure time, teaching time or supervisory time of PA/NP/Med student/Med Resident etc but could involve care discussion time.       [facility] 07/25/2021 12:55 PM  Pager: see AMION  If no response to pager , please call critical care on call (see AMION) until 7pm After 7:00 pm call Elink

## 2021-07-26 DIAGNOSIS — N179 Acute kidney failure, unspecified: Secondary | ICD-10-CM | POA: Diagnosis not present

## 2021-07-26 DIAGNOSIS — G931 Anoxic brain damage, not elsewhere classified: Secondary | ICD-10-CM | POA: Diagnosis not present

## 2021-07-26 DIAGNOSIS — F1010 Alcohol abuse, uncomplicated: Secondary | ICD-10-CM

## 2021-07-26 DIAGNOSIS — S72001A Fracture of unspecified part of neck of right femur, initial encounter for closed fracture: Secondary | ICD-10-CM | POA: Diagnosis not present

## 2021-07-26 DIAGNOSIS — D62 Acute posthemorrhagic anemia: Secondary | ICD-10-CM | POA: Diagnosis not present

## 2021-07-26 DIAGNOSIS — S065XAA Traumatic subdural hemorrhage with loss of consciousness status unknown, initial encounter: Secondary | ICD-10-CM

## 2021-07-26 LAB — COMPREHENSIVE METABOLIC PANEL
ALT: 22 U/L (ref 0–44)
AST: 65 U/L — ABNORMAL HIGH (ref 15–41)
Albumin: <1.5 g/dL — ABNORMAL LOW (ref 3.5–5.0)
Alkaline Phosphatase: 458 U/L — ABNORMAL HIGH (ref 38–126)
Anion gap: 10 (ref 5–15)
BUN: 72 mg/dL — ABNORMAL HIGH (ref 8–23)
CO2: 19 mmol/L — ABNORMAL LOW (ref 22–32)
Calcium: 7.6 mg/dL — ABNORMAL LOW (ref 8.9–10.3)
Chloride: 106 mmol/L (ref 98–111)
Creatinine, Ser: 2.09 mg/dL — ABNORMAL HIGH (ref 0.61–1.24)
GFR, Estimated: 31 mL/min — ABNORMAL LOW (ref >60)
Glucose, Bld: 106 mg/dL — ABNORMAL HIGH (ref 70–99)
Potassium: 4.3 mmol/L (ref 3.5–5.1)
Sodium: 135 mmol/L (ref 135–145)
Total Bilirubin: 0.4 mg/dL (ref 0.3–1.2)
Total Protein: 4.9 g/dL — ABNORMAL LOW (ref 6.5–8.1)

## 2021-07-26 LAB — CBC
HCT: 23.6 % — ABNORMAL LOW (ref 39.0–52.0)
Hemoglobin: 7.8 g/dL — ABNORMAL LOW (ref 13.0–17.0)
MCH: 28.5 pg (ref 26.0–34.0)
MCHC: 33.1 g/dL (ref 30.0–36.0)
MCV: 86.1 fL (ref 80.0–100.0)
Platelets: 241 10*3/uL (ref 150–400)
RBC: 2.74 MIL/uL — ABNORMAL LOW (ref 4.22–5.81)
RDW: 18.2 % — ABNORMAL HIGH (ref 11.5–15.5)
WBC: 12.5 10*3/uL — ABNORMAL HIGH (ref 4.0–10.5)
nRBC: 0.2 % (ref 0.0–0.2)

## 2021-07-26 LAB — GLUCOSE, CAPILLARY
Glucose-Capillary: 93 mg/dL (ref 70–99)
Glucose-Capillary: 95 mg/dL (ref 70–99)
Glucose-Capillary: 93 mg/dL (ref 70–99)
Glucose-Capillary: 92 mg/dL (ref 70–99)
Glucose-Capillary: 84 mg/dL (ref 70–99)
Glucose-Capillary: 96 mg/dL (ref 70–99)

## 2021-07-26 LAB — MAGNESIUM: Magnesium: 2 mg/dL (ref 1.7–2.4)

## 2021-07-26 MED ORDER — CHLORHEXIDINE GLUCONATE CLOTH 2 % EX PADS
6.0000 | MEDICATED_PAD | CUTANEOUS | Status: DC
  Administered 2021-07-27: 6 via TOPICAL

## 2021-07-26 MED ORDER — FUROSEMIDE 10 MG/ML IJ SOLN
80.0000 mg | Freq: Once | INTRAMUSCULAR | Status: AC
  Administered 2021-07-26: 80 mg via INTRAVENOUS
  Filled 2021-07-26: qty 8

## 2021-07-26 NOTE — Progress Notes (Signed)
NAME:  Gary Stevenson, MRN:  [id number], DOB:  Feb 16, 1941, LOS: 50 ADMISSION DATE:  07/12/2021, CONSULTATION DATE: 07/14/2021 REFERRING MD:  Marlyce Huge, CHIEF COMPLAINT:  AMS, Respiratory failure   History of Present Illness:  81 y.o. M with PMH significant for right frontal subdural hematomas status post surgical evacuation in 2012, lung cancer status post lobectomy, alcohol abuse, CKD who presented to the ED 1/12 after a fall and was found to have R acetabular fx with interval displacement and underwent R total hip arthroplasty on 07/10/2021.  He was later found with decreased responsiveness and seizure activity that lasted for around 1 minute.  He was seen by Neurology and started on Phenobarbital and Keppra with LTM EEG.  Neurology felt seizure was most likely secondary to subdural hematoma and he was gradually improving until 1/17 when he was noted to have worsening mental status again with hypotension.  ABG 7.134 and pCO2 60.7, patient's mental status was not amenable for Bipap, so PCCM consulted for transfer. ICU course complicated by prolonged respiratory failure requiring tracheostomy, continuous EEG for seizure monitoring, intermittent vasopressor use & AFwRVR.   Pertinent  Medical History  Arthritis  Brain Aneurysm  ETOH Abuse  CKD  Lung Cancer  Significant Hospital Events: Including procedures, antibiotic start and stop dates in addition to other pertinent events   1/11 admission for R hip fx 1/12 R hip arthoplasty 1/13 Sz activity 1/17 worsening mental status, PCCM consult and ICU txfr, intubated  1/18 CT head with stable small bilateral subdural hematomas but no acute intracranial abnormalities. 1/18 EEG abnormal comatose EEG, no epileptiform discharges 1/18 MRI brain stable bilateral frontal subacute subdural hematoma.  No new intracranial abnormalities. 1/19 CXR stable multilobar pneumonia, resolved RUL collapse, continued LLL consolidation/collapse 1/19 cEEG with BIPDs with some  brief electrographic seizures during the initial portion of the recording, indicative of a multifocal epileptogenic disturbance. Seizures were improved in the afternoon and evening 1/21 worsening shock, acidosis.  Abx changed to Vanco + meropenem.  Made DNR after family meeting 1/23 palliative met with family and seems that family wants full scope of offered tx, continue DNR  1/26 cEEG while weaning phenobarbital, remains comatose  1/27 GI and surgery unable to place PEG  Interim History / Subjective:  unchanged  Objective   Blood pressure 107/70, pulse 76, temperature (!) 97.3 F (36.3 C), temperature source Oral, resp. rate 17, height '5\' 5"'  (1.651 m), weight 62.6 kg, SpO2 100 %.    Vent Mode: PRVC FiO2 (%):  [40 %] 40 % Set Rate:  [28 bmp] 28 bmp Vt Set:  [490 mL] 490 mL PEEP:  [5 cmH20] 5 cmH20 Pressure Support:  [15 cmH20] 15 cmH20 Plateau Pressure:  [15 GYI94-85 cmH20] 21 cmH20   Intake/Output Summary (Last 24 hours) at 07/26/2021 1149 Last data filed at 07/26/2021 0800 Gross per 24 hour  Intake 1490 ml  Output 825 ml  Net 665 ml   Filed Weights   07/25/21 0218 07/26/21 0143  Weight: 62.6 kg 62.6 kg    Exam: Cachectic, chronically and acutely ill appearing RRR no mrg Thin, no edema trach to vent Sarcopenia   Cr 2.06 Alb<1.5 Na 135 AP 458 WBC 12.5 Glucose 90s  Resolved Hospital Problem list   Hypernatremia Respiratory acidosis Septic shock  Ileus AF with RVR  Assessment & Plan:   Acute Metabolic Encephalopathy - multifactorial  Bilateral infarcts involving cerebellar, occipital, and parietal regions with L pontine involvement, with cytotoxic edema  Chronic SDH L ICA aneurysm  r/o mycotic Hx seizures -continuous EEG with phenobarbital weaning per Neurology. EEG reviewed this morning looks marginally better.  -continue antipleptics  -seizure precautions  - would need DAPT at some point pending goals of care -continue ASA 41m  -follow serial neuro  exam   Acute hypoxic and hypercarbic respiratory failure  PNA due to klebsiella pneumoniae and enterobacter cloacae   Intubated 1/17, trach placed 1/26. Completed Mero 1/25.  -PRVC 8cc/kg  -wean PEEP / FiO2 for sats >90% -mental status barrier to weaning at this point  -trach care per protocol  -clip trach sutures 7-10 days post placement (1/26) -family wants to pursue PEG / LTAC placement  - will diurese daily given he is 28 L positive, and as renal function allows  AKI on CKD IIIa  Hypoglycemia  Metabolic acidosis related to GI losses, AKI on CKD   Volume overload/third spacing - tube feeds at goal -Trend BMP / urinary output -Replace electrolytes as indicated -Avoid nephrotoxic agents, ensure adequate renal perfusion - will diurese with IV lasix x 1 as above  Anemia  Baseline Hgb 8-10 -trend CBC  -transfuse for Hgb <7  Severe protein calorie malnutrition FTT in Adult - tube feeds at goal for now - unable to receive peg due to anatomy - discussed with GI and surgery -continue MVI, thiamine  Displaced R femoral neck fracture, R acetabulum fx , R femoral head impaction fx S/p R total hip arthroplasty 1/12 -local care to site  -PT when mental status permits   DNR Status Goals of Care -multiple conversations with family.  DNR but full medical care otherwise.   - unclear what his disposition will be if we are not able to provide nutrition for him. - I have asked the nurse to message me when his family at bedside.  Best Practice (right click and "Reselect all SmartList Selections" daily)  Diet/type: NPO, TF  DVT prophylaxis: heparin GI prophylaxis: PPI Lines: He has a left femoral CVC that has been in since Jan 17th. He is now off pressors. Will discuss getting PIV and discontinuation of CVC  Foley:  N/A Code Status: DNR Last date of multidisciplinary goals of care discussion: updated by B. Ollis NP over the phone 1/27. Will discuss with them again today regarding  nutrition plan   Critical Care Time: 32 minutes   The patient is critically ill due to respiratory failure.  Critical care was necessary to treat or prevent imminent or life-threatening deterioration.  Critical care was time spent personally by me on the following activities: development of treatment plan with patient and/or surrogate as well as nursing, discussions with consultants, evaluation of patient's response to treatment, examination of patient, obtaining history from patient or surrogate, ordering and performing treatments and interventions, ordering and review of laboratory studies, ordering and review of radiographic studies, pulse oximetry, re-evaluation of patient's condition and participation in multidisciplinary rounds.   Critical Care Time devoted to patient care services described in this note is 35 minutes. This time reflects time of care of this [place]. This critical care time does not reflect separately billable procedures or procedure time, teaching time or supervisory time of PA/NP/Med student/Med Resident etc but could involve care discussion time.       [facility] 07/26/2021 11:49 AM  Pager: see AMION  If no response to pager , please call critical care on call (see AMION) until 7pm After 7:00 pm call Elink

## 2021-07-26 NOTE — Progress Notes (Signed)
°  Interdisciplinary Goals of Care Family Meeting   Date carried out: 07/26/2021  Location of the meeting: Bedside  Member's involved: Physician, Bedside Registered Nurse, and Family Member or next of kin  Durable Power of Attorney or acting medical decision maker: Patient's daughter Gary Stevenson and patient's wife.   Discussion: We discussed goals of care for Gary Stevenson.  I discussed with the patient's wife and daughter that unfortunately neither GI neurosurgery has been able to place a PEG tube.  GI was immediately unable to do it due to anatomy, and surgery for the same reason.  They did not feel that they would be able to offer a surgical G-tube placement, and that his risk for mortality would be quite high for open surgery.  I discussed with the patient's family that this meant he had no realistic way to receive enteral nutrition.  He does have a cortrack in place, but this is associated with side effects such as now erosion of the nasal mucosa and palate, head and neck infection, esophageal damage.  It is generally not intended for long-term nutrition.  He would not be a candidate for parenteral nutrition.  I told Gary Stevenson that eventually the life support devices including ventilator, NG tube, IV lines and tubes, medications may present more harm than benefit.  Discussed with her that if that time came I recommendation would be to stop those supports and transition him to comfort measures.  I explained that this would allow him to die natural death.  I told her that the likelihood for meaningful recovery including waking up talking walking or even leaving his hospital room is almost 0 based on the severity of his brain damage from chronic alcohol use and subdural hematomas and strokes, poor nutritional status, hip fracture, respiratory failure.She told me that as long as he is alive she is okay with his quality life.  She says that people are lives on tubes and machines all the times.  She says that  she discussed this with her father previously and this would be consistent with his wishes.  She would like to keep him alive at any cost.  His wife did not say very much during this meeting.  I explained that we can leave the NG tube in place for now, but at some point that may need to be removed if it shows more harm than benefit.  At that point we will be reaching the end of the line with regards to what we can offer.  Code status: Full Code  Disposition: Continue current acute care  Time spent for the meeting: 20 minutes    Spero Geralds, MD  07/26/2021, 2:38 PM

## 2021-07-26 NOTE — Procedures (Addendum)
Patient Name: Gary Stevenson  MRN: [id number]  Epilepsy Attending: Lora Havens  Referring Physician/Provider: Dr Rosalin Hawking Duration: 07/25/2021 1608 to 07/26/2021 1608   Patient history: 81yo m with right SDH, now with seizure like activity. EEG to evaluate for seizure   Level of alertness: awake/lethargic, asleep   AEDs during EEG study: LEV, phenobarb   Technical aspects: This EEG study was done with scalp electrodes positioned according to the 10-20 International system of electrode placement. Electrical activity was acquired at a sampling rate of 500Hz  and reviewed with a high frequency filter of 70Hz  and a low frequency filter of 1Hz . EEG data were recorded continuously and digitally stored.    Description: No posterior dominant rhythm was seen.  Sleep was characterized by sleep spindles (12 to 14 Hz), maximum frontocentral region.  EEG showed continuous generalized and lateralized right hemisphere 3 to 6 Hz theta-delta slowing. EEG showed sharp waves independently from left and right hemisphere.  Hyperventilation and photic stimulation were not performed.     ABNORMALITY - Sharp wave, left and right hemisphere - Continuous slow, generalized and lateralized right hemisphere   IMPRESSION: This study showed evidence of of independent epileptogenicity arising from left and right hemisphere. Additionally there is evidence of moderate diffuse encephalopathy, nonspecific etiology.  No definite seizures were seen during the study.  EEG appears to be improving compared to previous day.   Channelle Bottger Barbra Sarks

## 2021-07-26 NOTE — Progress Notes (Signed)
Ltm maintain at bedside. No skin breakdown noted. Results pending.

## 2021-07-27 DIAGNOSIS — Z9911 Dependence on respirator [ventilator] status: Secondary | ICD-10-CM

## 2021-07-27 DIAGNOSIS — S72001A Fracture of unspecified part of neck of right femur, initial encounter for closed fracture: Secondary | ICD-10-CM | POA: Diagnosis not present

## 2021-07-27 DIAGNOSIS — J9601 Acute respiratory failure with hypoxia: Secondary | ICD-10-CM | POA: Diagnosis not present

## 2021-07-27 LAB — GLUCOSE, CAPILLARY
Glucose-Capillary: 87 mg/dL (ref 70–99)
Glucose-Capillary: 90 mg/dL (ref 70–99)
Glucose-Capillary: 93 mg/dL (ref 70–99)
Glucose-Capillary: 94 mg/dL (ref 70–99)
Glucose-Capillary: 94 mg/dL (ref 70–99)
Glucose-Capillary: 105 mg/dL — ABNORMAL HIGH (ref 70–99)

## 2021-07-27 LAB — BASIC METABOLIC PANEL
Anion gap: 10 (ref 5–15)
BUN: 85 mg/dL — ABNORMAL HIGH (ref 8–23)
CO2: 19 mmol/L — ABNORMAL LOW (ref 22–32)
Calcium: 7.7 mg/dL — ABNORMAL LOW (ref 8.9–10.3)
Chloride: 109 mmol/L (ref 98–111)
Creatinine, Ser: 2.35 mg/dL — ABNORMAL HIGH (ref 0.61–1.24)
GFR, Estimated: 27 mL/min — ABNORMAL LOW (ref >60)
Glucose, Bld: 97 mg/dL (ref 70–99)
Potassium: 4.9 mmol/L (ref 3.5–5.1)
Sodium: 138 mmol/L (ref 135–145)

## 2021-07-27 LAB — MAGNESIUM: Magnesium: 2 mg/dL (ref 1.7–2.4)

## 2021-07-27 MED ORDER — SODIUM CHLORIDE 0.9 % IV BOLUS
500.0000 mL | Freq: Once | INTRAVENOUS | Status: AC
  Administered 2021-07-28: 500 mL via INTRAVENOUS

## 2021-07-27 MED ORDER — NUTRISOURCE FIBER PO PACK
1.0000 | PACK | Freq: Two times a day (BID) | ORAL | Status: DC
  Administered 2021-07-27 (×2): 1
  Filled 2021-07-27 (×3): qty 1

## 2021-07-27 MED ORDER — SODIUM CHLORIDE 0.9 % IV SOLN: 250.0000 mL | INTRAVENOUS | Status: DC

## 2021-07-27 MED ORDER — PHENYLEPHRINE HCL-NACL 20-0.9 MG/250ML-% IV SOLN
25.0000 ug/min | INTRAVENOUS | Status: DC
  Administered 2021-07-27: 25 ug/min via INTRAVENOUS
  Administered 2021-07-28: 250 ug/min via INTRAVENOUS
  Filled 2021-07-27 (×2): qty 250

## 2021-07-27 MED ORDER — FUROSEMIDE 10 MG/ML IJ SOLN
80.0000 mg | Freq: Three times a day (TID) | INTRAMUSCULAR | Status: AC
  Administered 2021-07-27 (×3): 80 mg via INTRAVENOUS
  Filled 2021-07-27 (×3): qty 8

## 2021-07-27 MED ORDER — AMIODARONE IV BOLUS ONLY 150 MG/100ML
150.0000 mg | Freq: Once | INTRAVENOUS | Status: AC
  Administered 2021-07-27: 150 mg via INTRAVENOUS
  Filled 2021-07-27: qty 100

## 2021-07-27 MED ORDER — IPRATROPIUM-ALBUTEROL 0.5-2.5 (3) MG/3ML IN SOLN
3.0000 mL | Freq: Three times a day (TID) | RESPIRATORY_TRACT | Status: DC
  Administered 2021-07-27 (×2): 3 mL via RESPIRATORY_TRACT
  Filled 2021-07-27 (×2): qty 3

## 2021-07-27 NOTE — Procedures (Addendum)
Patient Name: ABDULRAHIM SIDDIQI  MRN: [id number]  Epilepsy Attending: Lora Havens  Referring Physician/Provider: Dr Rosalin Hawking Duration: 07/26/2021 1608 to 07/27/2021 1654   Patient history: 81yo m with right SDH, now with seizure like activity. EEG to evaluate for seizure   Level of alertness: awake/lethargic, asleep   AEDs during EEG study: LEV, phenobarb   Technical aspects: This EEG study was done with scalp electrodes positioned according to the 10-20 International system of electrode placement. Electrical activity was acquired at a sampling rate of 500Hz  and reviewed with a high frequency filter of 70Hz  and a low frequency filter of 1Hz . EEG data were recorded continuously and digitally stored.    Description: No posterior dominant rhythm was seen.  Sleep was characterized by sleep spindles (12 to 14 Hz), maximum frontocentral region.  EEG showed continuous generalized 3 to 6 Hz theta-delta slowing. EEG showed sharp waves independently from left and right hemisphere.  Hyperventilation and photic stimulation were not performed.     ABNORMALITY - Sharp wave, left and right hemisphere - Continuous slow, generalized   IMPRESSION: This study showed evidence of of independent epileptogenicity arising from left and right hemisphere. Additionally there is evidence of moderate diffuse encephalopathy, nonspecific etiology.  No definite seizures were seen during the study.   Umair Rosiles Barbra Sarks

## 2021-07-27 NOTE — Progress Notes (Signed)
eLink Physician-Brief Progress Note Patient Name: Gary Stevenson DOB: [birth year] MRN: [id number]   Date of Service  07/27/2021  HPI/Events of Note  Hypotension - BP = 63/50 with MAP = 56. No CVL or CVP. LVEF = 55-60%.  eICU Interventions  Plan: Bolus with 0.9 NaCl 500 mL IV over 30 minutes now. Phenylephrine IV infusion via PIV. Titrate to MAP >= 65.     Intervention Category Major Interventions: Hypotension - evaluation and management  [name] 07/27/2021, 11:17 PM

## 2021-07-27 NOTE — Progress Notes (Signed)
eLink Physician-Brief Progress Note Patient Name: Gary Stevenson DOB: [birth year] MRN: [id number]   Date of Service  07/27/2021  HPI/Events of Note  AFIB with RVR - Ventricular rate = 120's to 130's. BP - 95/68. Reluctant to anticoagulate d/t chronic SDH.  eICU Interventions  Plan: Amiodarone bolus 150 mg IV over 10 minutes now. BMP and Mg++ level STAT.     Intervention Category Major Interventions: Arrhythmia - evaluation and management  Hans Rusher Cornelia Copa 07/27/2021, 8:58 PM

## 2021-07-27 NOTE — Progress Notes (Signed)
LTM EEG discontinued -  [facility] notified. skin breakdown under FP2 FZ CZ PZ F8 T8 A1 at [facility]. RN notified and will document.

## 2021-07-27 NOTE — Progress Notes (Signed)
Patient EEG lead removed by EEG tech she reported some skin breakdown on head from leads.

## 2021-07-27 NOTE — Progress Notes (Signed)
Protocol consult received for US guided IV due to vasopressor order. Pt currently has working [place] IV. Consult cleared.

## 2021-07-27 NOTE — Progress Notes (Signed)
NAME:  Gary Stevenson, MRN:  [id number], DOB:  [birth year], LOS: 7 ADMISSION DATE:  07/19/2021, CONSULTATION DATE: 07/14/2021 REFERRING MD:  Marlyce Huge, CHIEF COMPLAINT:  AMS, Respiratory failure   History of Present Illness:  81 y.o. M with PMH significant for right frontal subdural hematomas status post surgical evacuation in 2012, lung cancer status post lobectomy, alcohol abuse, CKD who presented to the ED 1/12 after a fall and was found to have R acetabular fx with interval displacement and underwent R total hip arthroplasty on 07/15/2021.  He was later found with decreased responsiveness and seizure activity that lasted for around 1 minute.  He was seen by Neurology and started on Phenobarbital and Keppra with LTM EEG.  Neurology felt seizure was most likely secondary to subdural hematoma and he was gradually improving until 1/17 when he was noted to have worsening mental status again with hypotension.  ABG 7.134 and pCO2 60.7, patient's mental status was not amenable for Bipap, so PCCM consulted for transfer. ICU course complicated by prolonged respiratory failure requiring tracheostomy, continuous EEG for seizure monitoring, intermittent vasopressor use & AFwRVR.   Pertinent  Medical History  Arthritis  Brain Aneurysm  ETOH Abuse  CKD  Lung Cancer  Significant Hospital Events: Including procedures, antibiotic start and stop dates in addition to other pertinent events   1/11 admission for R hip fx 1/12 R hip arthoplasty 1/13 Sz activity 1/17 worsening mental status, PCCM consult and ICU txfr, intubated  1/18 CT head with stable small bilateral subdural hematomas but no acute intracranial abnormalities. 1/18 EEG abnormal comatose EEG, no epileptiform discharges 1/18 MRI brain stable bilateral frontal subacute subdural hematoma.  No new intracranial abnormalities. 1/19 CXR stable multilobar pneumonia, resolved RUL collapse, continued LLL consolidation/collapse 1/19 cEEG with BIPDs with some  brief electrographic seizures during the initial portion of the recording, indicative of a multifocal epileptogenic disturbance. Seizures were improved in the afternoon and evening 1/21 worsening shock, acidosis.  Abx changed to Vanco + meropenem.  Made DNR after family meeting 1/23 palliative met with family and seems that family wants full scope of offered tx, continue DNR  1/26 cEEG while weaning phenobarbital, remains comatose  1/27 GI and surgery unable to place PEG 1/29 CVC removed  Interim History / Subjective:  This morning he remains minimally responsive. Tmax 100.  Objective   Blood pressure 105/61, pulse 98, temperature 98.6 F (37 C), temperature source Oral, resp. rate (!) 28, height _0  (1.651 m), weight 63.6 kg, SpO2 100 %.    Vent Mode: PRVC FiO2 (%):  [40 %] 40 % Set Rate:  [28 bmp] 28 bmp Vt Set:  [490 mL] 490 mL PEEP:  [5 cmH20] 5 cmH20 Pressure Support:  [15 cmH20] 15 cmH20 Plateau Pressure:  [21 cmH20] 21 cmH20   Intake/Output Summary (Last 24 hours) at 07/27/2021 0711 Last data filed at 07/27/2021 0600 Gross per 24 hour  Intake 1990 ml  Output 650 ml  Net 1340 ml    Filed Weights   07/25/21 0218 07/26/21 0143 07/27/21 0400  Weight: 62.6 kg 62.6 kg 63.6 kg    Exam: General: Critically ill appearing man lying in bed not sedated, on  HEENT: Cataracts worse R than left. Oral mucosa moist. Cardio: S1S2, RRR Resp: rhonchi cleared with suctioning, faint wheezing. Synchronous with MV. Small volume of bloody, thick secretions.  Abd: soft, NT Extremities: diffuse anasarca Derm: warm, dry Neuro: not responsive to verbal stimulation or during exam. PERRL.   No updated labs.  BG 80-90s  Resolved Hospital Problem list   Hypernatremia Respiratory acidosis Septic shock  Ileus AF with RVR  Assessment & Plan:   Acute Metabolic Encephalopathy - multifactorial  Bilateral infarcts involving cerebellar, occipital, and parietal regions with L pontine  involvement, with cytotoxic edema  Chronic SDH L ICA aneurysm r/o mycotic Hx seizures -cEEG. Weaning phenobarb per neurology.  -Con't AEPs and seizure precautions -Con't ASA; no plans to start plavix with bleeding since tracheostomy  -Would need DAPT at some point    Acute hypoxic and hypercarbic respiratory failure  PNA due to klebsiella pneumoniae and enterobacter cloacae   Intubated 1/17, trach placed 1/26. Completed Mero 1/25.  -PRVC 4-8cc/kg IBW with goal Pplat<30 and DP<15 -wean PEEP and FiO2 to maintain SpO2 >90%   -wean PEEP / FiO2 for sats >90% -trach care per protocol -needs aggressive diuresis -clip trach sutures 7-10 days post placement (1/26) -family wants to pursue LTAC placement, but he can't get a PEG due to anatomy  AKI on CKD IIIa  Hypoglycemia  Metabolic acidosis related to GI losses, AKI on CKD   Volume overload/third spacing -renally dose meds, avoid nephrotoxic meds -needs diuresis -strict I/OS -monitor  Anemia , chronic Baseline Hgb 8-10 -transfuse for Hb<7 or hemodynamically significant bleeding -monitor  Severe protein calorie malnutrition FTT in Adult -con't TF -unable to get PEG due to anatomy per GI & surgery; con't cortrak. Not a good long-term strategy.  - con't vitamins  Displaced R femoral neck fracture, R acetabulum fx , R femoral head impaction fx S/p R total hip arthroplasty 1/12 -appreciate ortho's management -PT when/ if able -pain control PRN  DNR Status Goals of Care -multiple conversations with family.  DNR but full medical care otherwise.   - unclear what his disposition will be if we are not able to provide nutrition for him  Best Practice (right click and "Reselect all SmartList Selections" daily)  Diet/type: NPO, TF  DVT prophylaxis: heparin GI prophylaxis: PPI Lines: CVC out Foley:  N/A Code Status: DNR Last date of multidisciplinary goals of care discussion: 1/30 with Dr. Shearon Stalls   Critical Care Time:       This patient is critically ill with multiple organ system failure which requires frequent high complexity decision making, assessment, support, evaluation, and titration of therapies. This was completed through the application of advanced monitoring technologies and extensive interpretation of multiple databases. During this encounter critical care time was devoted to patient care services described in this note for 35 minutes.  Julian Hy, DO 07/27/21 8:21 AM [facility]

## 2021-07-27 NOTE — Progress Notes (Signed)
EEG maintenance performed.  Mild skin breakdown at T8.  Repositioned T8 electrode.  No skin breakdown observed at electrode sites Fp1, Fp2, T7.

## 2021-07-27 NOTE — Progress Notes (Addendum)
STROKE TEAM PROGRESS NOTE   INTERVAL HISTORY Daughter at the bedside. Extensively discussed poor prognosis and limited quality of life with daughter. Neurological exam largely unchanged from last week. No blink to visual threat. Minimal withdrawal to pain on left upper extremity.   LTM shows no seizure activities overnight hence plan to discontinue today and tapering patient off phenobarbital.   Vitals:   07/27/21 0711 07/27/21 0735 07/27/21 0800 07/27/21 1116  BP:   105/63   Pulse:  97 97   Resp:  (!) 26 (!) 25   Temp: 98 F (36.7 C)   (!) 97.4 F (36.3 C)  TempSrc: Axillary   Axillary  SpO2:  100% 100%   Weight:      Height:       CBC:  Recent Labs  Lab 07/25/21 0223 07/26/21 0305  WBC 12.1* 12.5*  HGB 7.1* 7.8*  HCT 20.9* 23.6*  MCV 85.0 86.1  PLT 195 629    Basic Metabolic Panel:  Recent Labs  Lab 07/24/21 0435 07/25/21 0223 07/26/21 0305 07/27/21 0903  NA 137 136 135 138  K 4.9 4.4 4.3 4.9  CL 108 108 106 109  CO2 21* 20* 19* 19*  GLUCOSE 102* 103* 106* 97  BUN 67* 67* 72* 85*  CREATININE 2.16* 1.96* 2.09* 2.35*  CALCIUM 7.2* 7.5* 7.6* 7.7*  MG 2.2 2.1 2.0 2.0  PHOS 3.2 3.4  --   --     Lipid Panel:  Recent Labs  Lab 07/21/21 0322  CHOL 58  TRIG 83  HDL <10*  CHOLHDL NOT CALCULATED  VLDL 17  LDLCALC NOT CALCULATED    HgbA1c:  Recent Labs  Lab 07/21/21 0322  HGBA1C 5.7*    Urine Drug Screen: No results for input(s): LABOPIA, COCAINSCRNUR, LABBENZ, AMPHETMU, THCU, LABBARB in the last 168 hours.  Alcohol Level No results for input(s): ETH in the last 168 hours.  IMAGING past 24 hours No results found.  PHYSICAL EXAM  Physical Exam  Constitutional: Ill appearing african [place] male.  S/p tracheostomy on ventilator  Respiratory: Tracheostomy in  place, respirations synchronous with ventilator  Neuro: Patient has tracheostomy in place.  Comatose and unresponsive.  Eyes are disconjugate.  Eyes open inconsistently with oral  suctioning, no blink to threat. Pupils 79mm and sluggish, no corneal reflex. No gag reflex and weak cough with endotracheal suctioning. Slight withdrawal to pain in LUE and to a lesser extent in the left lower extremity.  No response to pain on the right.   ASSESSMENT/PLAN Mr. Gary Stevenson is a 81 y.o. male with history of prior tobacco use, brain aneurysm, right frontal SDH s/p surgical evacuation in 2012, lung cancer s/p RML lobectomy 01/2009, ETOH abuse, and CKD who is admitted s/p fall 07/24/2021 for resultant right femoral neck fracutre and right acetabular fracture. Initially neurology was consulted for post-operative concern for seizure. A few hours after the procedure, the patient was seen to have high amplitude right upper extremity "shaking" bilaterally that lasted approximately 1 minute in duration as witnessed by nursing. The onset was not witnessed. He has been encephalopathic since after surgery, but was noted to be alert and oriented prior to procedure.  1/12 he underwent a R hip arthroplasty.  1/13 seizure activity was noted.  1/17- transferred to ICU and intubated. 1/18 MRI brain showed stable bilateral frontal subacute subdural hematoma.  1/19 cEEG- BIPDs with some brief electrographic seizures during the initial portion of the recording, indicative of a multifocal epileptogenic disturbance.  1/22-  MRI - Extensive acute infarcts in the bilateral posterior circulation. Subtotal involvement of the left pons. Scattered bilateral cerebellar, occipital, and parietal lobe involvement. Cytotoxic edema without acute hemorrhage. Right > left mostly subfrontal SDH is stable since scan on 07/15/2021.  1/23- Breakthrough seizure activity, discuss with Dr. Hortense Ramal as far as titration of phenobarbital and keppra.  1/24 Unable to perform TEE due to tongue swelling.  Will reassess 1/25 1/25 TEE performed showing no PFO or ASD and filamentous structure on underside of TV, likely chordal apparatus 1/26  EEG on ictal-interictal continuum with low-intermediate potential for seizures 1/27 PEG placed  If family wishes to continue with stroke work up, plan for a loop recorder or 30 day monitor at discharge.   Stroke:  Scattered bilateral cerebellar, occipital, and parietal infarcts with left pontine involvement, etiology not quite clear, could be occult A. Fib vs. hypoperfusion due to hypotension Code Stroke- Stable small bilateral subdural hematomas over the anterior and inferior aspects of the frontal lobes bilaterally not significantly changed from the prior exam. There is mild mass effect on the frontal lobe on the right with no midline shift. Atrophy with chronic microvascular ischemic changes. 1/22/20233- MRI  Extensive acute infarcts in the bilateral posterior circulation. Subtotal involvement of the left pons. Scattered bilateral cerebellar, occipital, and parietal lobe involvement. Cytotoxic edema without acute hemorrhage. Right > left mostly subfrontal SDH is stable since 07/15/2021.  MRA  No LVO, 5 mm wide necked outpouching extending from the cavernous left ICA, suspicious for possible aneurysm - likely not mycotic aneurysm as endocarditis has been ruled out  2D Echo 50-55% Lower extremity venous doppler - no DVT TEE no endocarditis on MV and AV.  There is a filamentous mobile structure on the underside of the TV. Suspect chordal apparatus but cannot completely exclude vegetation.  However, no PFO or ASD.  This would not explain the stroke.  LDL 15.6 HgbA1c 5.7 VTE prophylaxis - SCDs No antithrombotic prior to admission, now on ASA 81. Do not recommend DAPT given cerebral aneurysm and pending PEG prodedure Therapy recommendations:  Pending Disposition:  Pending  Hypotension Stable now Off pressors Long-term BP goal normotensive  Seizure Developed a seizure after orthopedic surgery -> put on Keppra  Long-term EEG showed possible right hemisphere nonconvulsive status, added  phenobarbital after load.  Currently on phenobarbital and levetiracetam  Per CCM, will taper phenobarbital to see if neuro exam improves LTM EEG monitor to ensure no seizure on phenobarbital taper  Lipid management LDL 15.6 , goal < 70 No statin needed given low LDL level  Other Stroke Risk Factors Advanced Age >/= 36  Former smoker ETOH use, alcohol level, 2 drink(s) a day Chronic SDH- now noted to be stable on recent scans  Other Active Problems Respiratory Failure- Hypoxic hypercarbic, s/p trach - CCM on board Acute metabolic encephalopathy AKI on CKD Close displaced fracture of the right femoral neck Status post right hip total arthroplasty on 1/12 by orthopedic surgery.   Severe protein caloric malnutrition, failure to thrive  Hypoglycemia, resolved Klebsiella, Enterobacter pneumonia Septic shock Completed Abx course   Hospital day # 19 I have personally obtained history,examined this patient, reviewed notes, independently viewed imaging studies, participated in medical decision making and plan of care.ROS completed by me personally and pertinent positives fully documented  I have made any additions or clarifications directly to the above note. Agree with note above.  Patient prognosis is quite poor with sepsis, metabolic encephalopathy, seizures and by cerebral infarcts.  He  will likely need prolonged ventilatory support, tracheostomy, PEG tube and 24-hour nursing care.  I had a long discussion with his daughter at the bedside on L power of attorney for poor prognosis and answered questions.  Discontinue long-term EEG monitoring and continue Keppra alone for seizures for now.  Stroke team will sign off.  Discussed with Dr. Noemi Chapel critical care medicine.This patient is critically ill and at significant risk of neurological worsening, death and care requires constant monitoring of vital signs, hemodynamics,respiratory and cardiac monitoring, extensive review of multiple  databases, frequent neurological assessment, discussion with family, other specialists and medical decision making of high complexity.I have made any additions or clarifications directly to the above note.This critical care time does not reflect procedure time, or teaching time or supervisory time of PA/NP/Med Resident etc but could involve care discussion time.  I spent 30 minutes of neurocritical care time  in the care of  this patient.      Antony Contras, MD Medical Director [place] Pager: [phone] 07/27/2021 2:55 PM     Stroke Neurology 07/27/2021 11:47 AM  To contact Stroke Continuity provider, please refer to [email or web address] After hours, contact General Neurology

## 2021-07-28 ENCOUNTER — Inpatient Hospital Stay (HOSPITAL_COMMUNITY): Payer: Medicare HMO

## 2021-07-28 DIAGNOSIS — R6521 Severe sepsis with septic shock: Secondary | ICD-10-CM | POA: Diagnosis not present

## 2021-07-28 DIAGNOSIS — A419 Sepsis, unspecified organism: Secondary | ICD-10-CM | POA: Diagnosis not present

## 2021-07-28 LAB — CBC WITH DIFFERENTIAL/PLATELET
Abs Immature Granulocytes: 0.2 10*3/uL — ABNORMAL HIGH (ref 0.00–0.07)
Basophils Absolute: 0 10*3/uL (ref 0.0–0.1)
Basophils Relative: 0 %
Eosinophils Absolute: 0.2 10*3/uL (ref 0.0–0.5)
Eosinophils Relative: 2 %
HCT: 22.4 % — ABNORMAL LOW (ref 39.0–52.0)
Hemoglobin: 7.4 g/dL — ABNORMAL LOW (ref 13.0–17.0)
Lymphocytes Relative: 11 %
Lymphs Abs: 0.9 10*3/uL (ref 0.7–4.0)
MCH: 29.2 pg (ref 26.0–34.0)
MCHC: 33 g/dL (ref 30.0–36.0)
MCV: 88.5 fL (ref 80.0–100.0)
Metamyelocytes Relative: 2 %
Monocytes Absolute: 0.2 10*3/uL (ref 0.1–1.0)
Monocytes Relative: 3 %
Neutro Abs: 6.4 10*3/uL (ref 1.7–7.7)
Neutrophils Relative %: 82 %
Platelets: 282 10*3/uL (ref 150–400)
RBC: 2.53 MIL/uL — ABNORMAL LOW (ref 4.22–5.81)
RDW: 18.9 % — ABNORMAL HIGH (ref 11.5–15.5)
Smear Review: ADEQUATE
WBC: 7.8 10*3/uL (ref 4.0–10.5)
nRBC: 1.3 % — ABNORMAL HIGH (ref 0.0–0.2)
nRBC: 4 /100 WBC — ABNORMAL HIGH

## 2021-07-28 LAB — POCT I-STAT 7, (LYTES, BLD GAS, ICA,H+H)
Acid-base deficit: 1 mmol/L (ref 0.0–2.0)
Bicarbonate: 25.3 mmol/L (ref 20.0–28.0)
Calcium, Ion: 1.06 mmol/L — ABNORMAL LOW (ref 1.15–1.40)
HCT: 33 % — ABNORMAL LOW (ref 39.0–52.0)
Hemoglobin: 11.2 g/dL — ABNORMAL LOW (ref 13.0–17.0)
O2 Saturation: 84 %
Patient temperature: 97.5
Potassium: 6.1 mmol/L — ABNORMAL HIGH (ref 3.5–5.1)
Sodium: 142 mmol/L (ref 135–145)
TCO2: 27 mmol/L (ref 22–32)
pCO2 arterial: 47.8 mmHg (ref 32.0–48.0)
pH, Arterial: 7.329 — ABNORMAL LOW (ref 7.350–7.450)
pO2, Arterial: 51 mmHg — ABNORMAL LOW (ref 83.0–108.0)

## 2021-07-28 LAB — LACTIC ACID, PLASMA: Lactic Acid, Venous: 5.3 mmol/L (ref 0.5–1.9)

## 2021-07-28 LAB — COMPREHENSIVE METABOLIC PANEL
ALT: 30 U/L (ref 0–44)
AST: 85 U/L — ABNORMAL HIGH (ref 15–41)
Albumin: <1.5 g/dL — ABNORMAL LOW (ref 3.5–5.0)
Alkaline Phosphatase: 482 U/L — ABNORMAL HIGH (ref 38–126)
Anion gap: 15 (ref 5–15)
BUN: 90 mg/dL — ABNORMAL HIGH (ref 8–23)
CO2: 17 mmol/L — ABNORMAL LOW (ref 22–32)
Calcium: 7.7 mg/dL — ABNORMAL LOW (ref 8.9–10.3)
Chloride: 107 mmol/L (ref 98–111)
Creatinine, Ser: 2.68 mg/dL — ABNORMAL HIGH (ref 0.61–1.24)
GFR, Estimated: 23 mL/min — ABNORMAL LOW (ref >60)
Glucose, Bld: 75 mg/dL (ref 70–99)
Potassium: 6 mmol/L — ABNORMAL HIGH (ref 3.5–5.1)
Sodium: 139 mmol/L (ref 135–145)
Total Bilirubin: 0.3 mg/dL (ref 0.3–1.2)
Total Protein: 5.1 g/dL — ABNORMAL LOW (ref 6.5–8.1)

## 2021-07-28 LAB — GLUCOSE, CAPILLARY: Glucose-Capillary: 67 mg/dL — ABNORMAL LOW (ref 70–99)

## 2021-07-28 LAB — PROTIME-INR
INR: 1.2 (ref 0.8–1.2)
Prothrombin Time: 15.5 seconds — ABNORMAL HIGH (ref 11.4–15.2)

## 2021-07-28 LAB — MAGNESIUM: Magnesium: 2.2 mg/dL (ref 1.7–2.4)

## 2021-07-28 IMAGING — DX DG CHEST 1V PORT
2 series · 2 of 2 positions shown · non-contrast
Comparison: [DATE].

CLINICAL DATA: NG tube placement.

EXAM:
PORTABLE CHEST 1 VIEW, abdomen portable.

[chest]
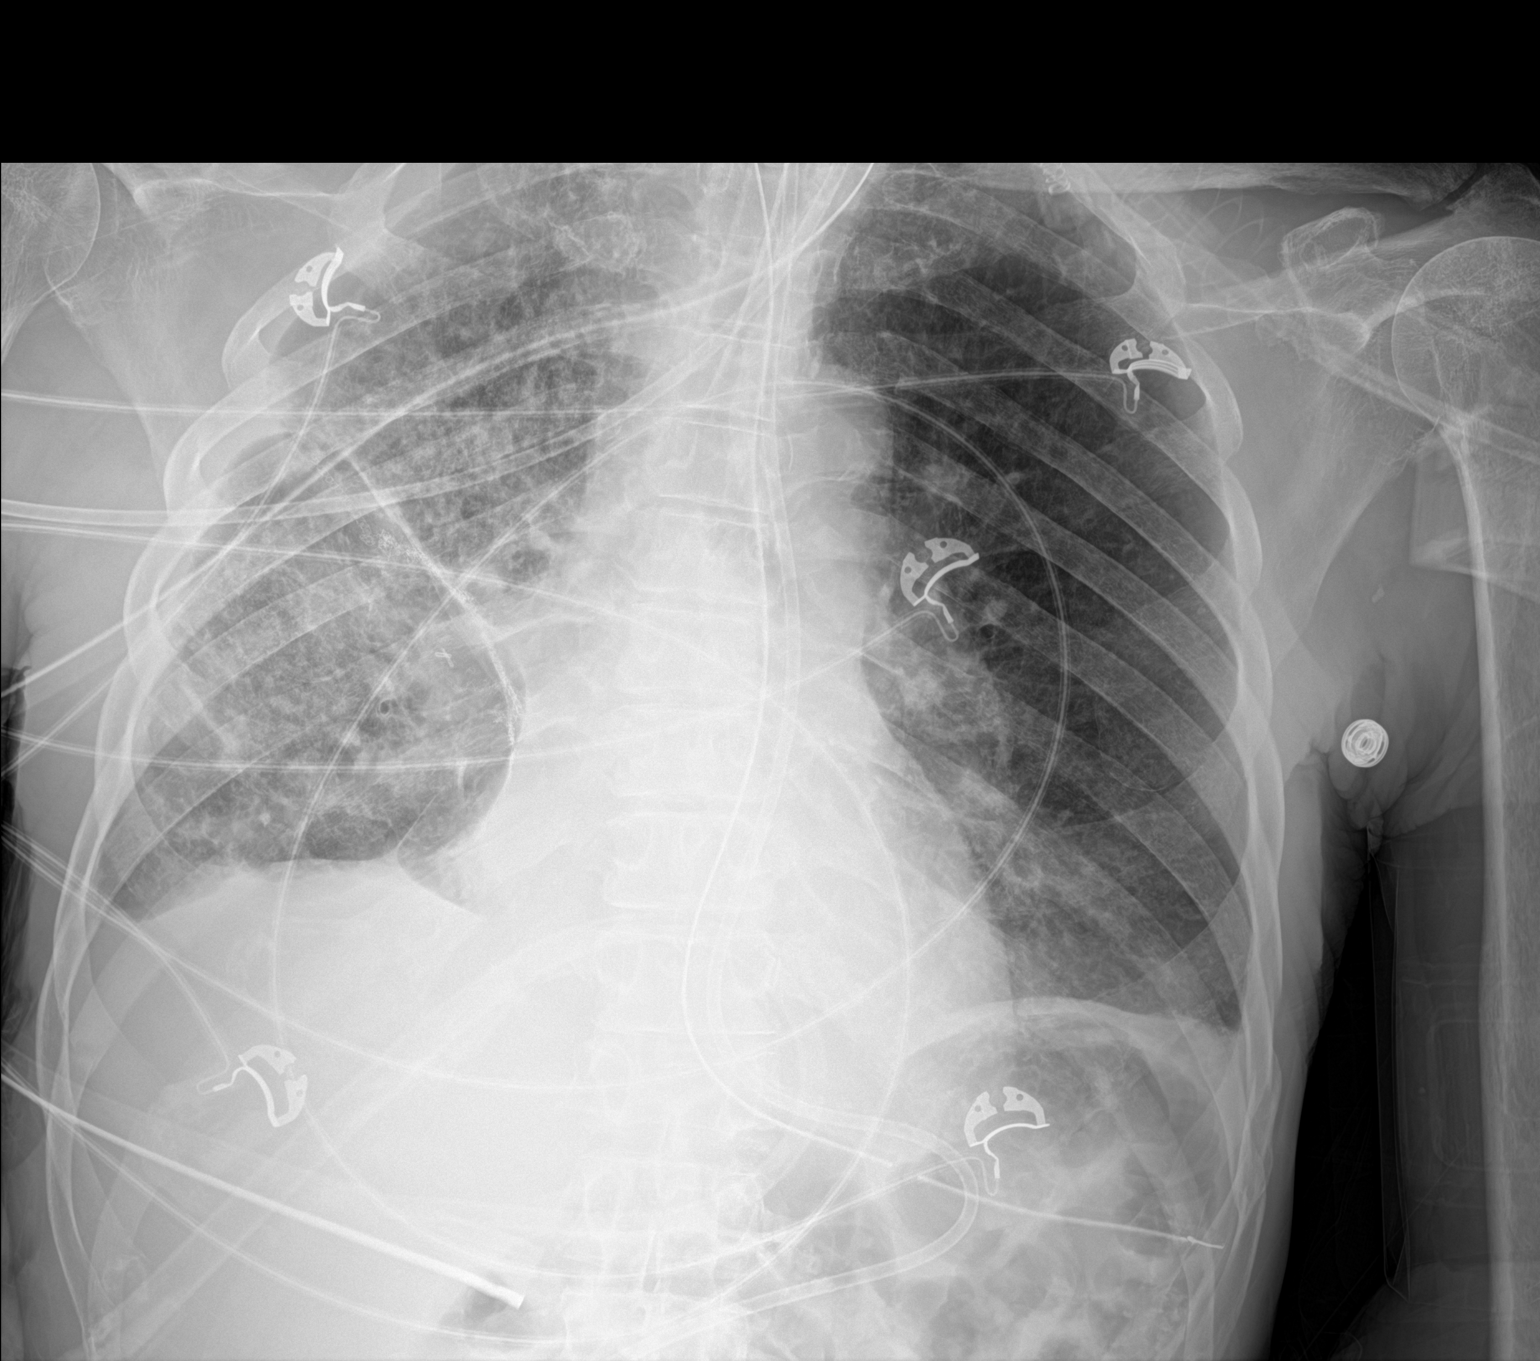

[chest ap]
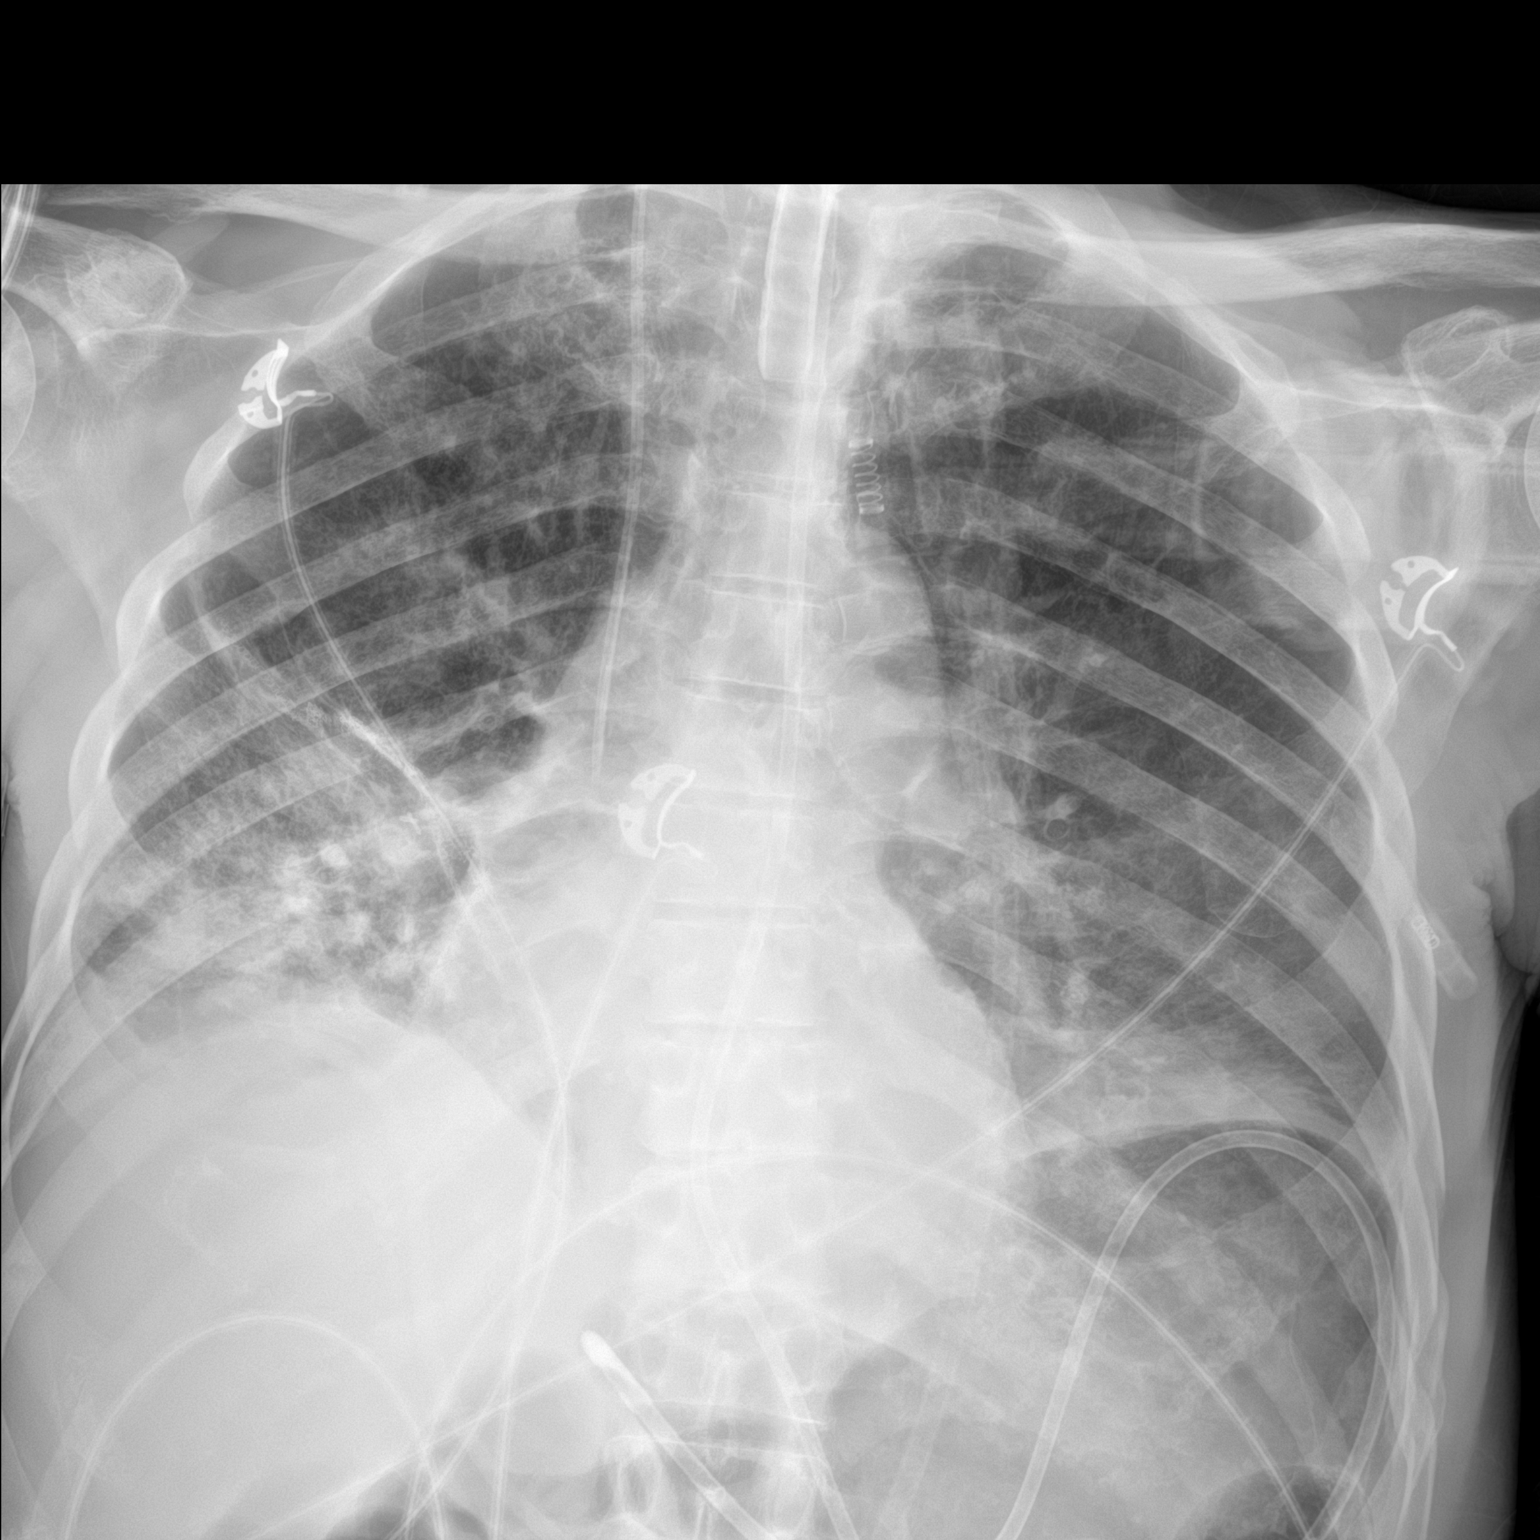

[2 of 2 positions shown; findings below may reference images not displayed]

FINDINGS: The heart is stable in size and mediastinal contours are within
normal limits. Atherosclerotic calcification of the aorta is noted.
Interstitial and airspace opacities are present in the right lung
and significantly improved from the prior exam. There is minimal
airspace disease at the left lung base. Blunting of the costophrenic
angles are noted bilaterally suggesting small bilateral pleural
effusions. Surgical changes are noted in the mid right lung. No
pneumothorax. An endotracheal tube terminates 2.5 cm above the
carina.

A feeding tube and enteric tube are noted in the stomach. No bowel
obstruction is seen. A catheter is seen over the pelvis on the left
terminating to the right of midline. Total hip arthroplasty changes
are noted on the right.
IMPRESSION: 1. Interval placement of nasogastric tube and endotracheal tubes and
remaining medical devices as described above.
2. Interstitial and airspace opacities in the right lungs with
markedly improved aeration as compared with the previous exam.
Findings may represent edema or infiltrate. Minimal residual
airspace disease is noted at the left lung base.
3. Small bilateral pleural effusions.

## 2021-07-28 MED ORDER — SODIUM BICARBONATE 8.4 % IV SOLN
INTRAVENOUS | Status: AC
  Administered 2021-07-28: 50 meq via INTRAVENOUS
  Filled 2021-07-28: qty 50

## 2021-07-28 MED ORDER — SODIUM BICARBONATE 8.4 % IV SOLN: 50.0000 meq | Freq: Once | INTRAVENOUS | Status: AC

## 2021-07-28 MED ORDER — ALBUMIN HUMAN 25 % IV SOLN
25.0000 g | Freq: Once | INTRAVENOUS | Status: AC
  Administered 2021-07-28: 25 g via INTRAVENOUS
  Filled 2021-07-28: qty 100

## 2021-07-28 MED ORDER — NOREPINEPHRINE 4 MG/250ML-% IV SOLN: 0.0000 ug/min | INTRAVENOUS | Status: DC

## 2021-07-28 MED ORDER — NOREPINEPHRINE 4 MG/250ML-% IV SOLN
INTRAVENOUS | Status: AC
  Administered 2021-07-28: 10 ug/min via INTRAVENOUS
  Filled 2021-07-28: qty 250

## 2021-07-28 MED ORDER — DEXTROSE 50 % IV SOLN: 12.5000 g | INTRAVENOUS | Status: AC

## 2021-07-28 MED ORDER — DEXTROSE 50 % IV SOLN
INTRAVENOUS | Status: AC
  Administered 2021-07-28: 12.5 g via INTRAVENOUS
  Filled 2021-07-28: qty 50

## 2021-07-29 NOTE — Progress Notes (Signed)
RT called to room to turn off and disconnect ventilator. Trach remains intact and suture in. Vent removed

## 2021-07-29 NOTE — Progress Notes (Signed)
Sudden worsening of hypotension.  Also had earlier in the evening developed afib with RVR, converted back to sinus with amio bolus.   Titrating up pressors, checking labs.  Will need central line for access.  Attempted to call family: Gary Stevenson, daughter.  Cell goes straight to voicemail which is full. Home number is disconnected.

## 2021-07-29 NOTE — Progress Notes (Addendum)
Attempted to contact [name], daughter beginning at [phone]. Her cell goes straight to voicemail which is full. Home number is disconnected.  Called again, following times: 0229, [place], [place], 0352, and 0422 with no answer. Called again at 0527, 0606 with no answer.

## 2021-07-29 NOTE — Progress Notes (Signed)
I called and spoke to Gary Stevenson to update her on the unfortunate events overnight. She was distraught but appreciate of his care.  Julian Hy, DO 08/03/21 8:05 AM [facility]

## 2021-07-29 NOTE — Progress Notes (Signed)
Patient DNR. Asystolic in 2 leads on monitor. Heart and lung fields auscultated for 2 minutes. Pupils fixed and dilated. TOD 0320 confirmed by this RN and Sela Hilding, RN. MD notified of cardiac TOD.

## 2021-07-29 NOTE — Procedures (Signed)
Central Venous Catheter Insertion Procedure Note  Gary Stevenson  [id number]  Feb 18, 1941  Date:08-13-2021  Time:2:51 AM   Provider Performing:[name]   Procedure: Insertion of Non-tunneled Central Venous Catheter(36556) with US guidance (73567)   Indication(s) Medication administration and Difficult access  Consent Unable to obtain consent due to emergent nature of procedure.  Anesthesia Topical only with 1% lidocaine   Timeout Verified patient identification, verified procedure, site/side was marked, verified correct patient position, special equipment/implants available, medications/allergies/relevant history reviewed, required imaging and test results available.  Sterile Technique Maximal sterile technique including full sterile barrier drape, hand hygiene, sterile gown, sterile gloves, mask, hair covering, sterile ultrasound probe cover (if used).  Procedure Description Area of catheter insertion was cleaned with chlorhexidine and draped in sterile fashion.  With real-time ultrasound guidance a central venous catheter was placed into the right internal jugular vein. Nonpulsatile blood flow and easy flushing noted in all ports.  The catheter was sutured in place and sterile dressing applied.  Complications/Tolerance None; patient tolerated the procedure well. Chest X-ray is ordered to verify placement for internal jugular or subclavian cannulation.   Chest x-ray is not ordered for femoral cannulation.  EBL Minimal  Specimen(s) None   Gary Stevenson, AGACNP-BC [facility]  See Amion for personal pager PCCM on call pager [phone] until 7pm. Please call Elink 7p-7a. [phone]  13-Aug-2021 2:52 AM

## 2021-07-29 NOTE — Death Summary Note (Signed)
Gary Stevenson   Patient Details  Name: Gary Stevenson MRN: [id number] DOB: 13-Nov-1940  Admission/Discharge Information   Admit Date:  2021-07-16  Date of Gary: Date of Gary: 08/05/21  Time of Gary: Time of Gary: 0320  Length of Stay: Aug 25, 2022  Referring Physician: Seward Carol, MD   Reason(s) for Hospitalization  Total hip arthroplasty  Diagnoses  Preliminary cause of Gary: Shock  Secondary Diagnoses (including complications and co-morbidities):  Principal Problem:   Closed displaced fracture of right femoral neck ([place]) Active Problems:   Acute on chronic blood loss anemia - present on admission   Displaced fracture of right femoral neck ([place])- present on admission   Protein-calorie malnutrition, severe - present on admission   Pressure injury of skin   Hypotension   Acute respiratory failure with hypoxia ([place])   Septic shock ([place])   Cerebral embolism with cerebral infarction Seizure Subdural hematoma Acute bilateral stroke, posterior-- cerebellar, occipital, parietal lobes Afib with RVR Possible L ICA aneurysm Klebsiella pneumonia Enterobacter cloacae pneumonia AKI on CKD 3a; CKD present on admission Acute metabolic encephalopathy Anasarca Hypoalbuminemia, present on admission Hypoglycemia Failure to thrive, present on admission Hyperkalemia Lactic acidosis   Brief Hospital Course (including significant findings, care, treatment, and services provided and events leading to Gary)  Gary Stevenson is a 81 y.o. year old male  with PMH significant for right frontal subdural hematomas status post surgical evacuation in 15-Aug-2010, lung cancer status post lobectomy, alcohol abuse, CKD who presented to the ED 1/12 after a fall and was found to have R acetabular fx with interval displacement and underwent R total hip arthroplasty on 07/12/2021.  He was later found with decreased responsiveness and seizure activity that lasted for around 1 minute.  He was seen by Neurology and  started on Phenobarbital and Keppra with LTM EEG.  Neurology felt seizure was most likely secondary to subdural hematoma and he was gradually improving until 1/17 when he was noted to have worsening mental status again with hypotension.  ABG 7.134 and pCO2 60.7, patient's mental status was not amenable for Bipap, so PCCM consulted for transfer. ICU course complicated by prolonged respiratory failure requiring tracheostomy, continuous EEG for seizure monitoring, intermittent vasopressor use & AFwRVR.   His hospitalization has been complicated by embolic strokes from an unknown source involving the bilateral posterior cerebral circulation (occipital, parietal, and cerebellar lobes) causing acute encephalopathy and seizures. A subacute SDH was diagnosed but may have predated admission. He required intubation and mechanical ventilation complicated by pneumonias, which were treated with antibiotics. Due to encephalopathy, he was unable to be weaned from MV and required tracheostomy for ongoing supportive care. He was unable to have a PEG tube placed due to his anatomy. He was supported with enteral nutrition throughout his admission to address his chronic severe protein energy malnutrition that was present on admission. He received antiepileptics and was monitored with EEG for his seizures.  Unfortunately he declined and had recurrent Afib with RVR, rapidly progressive shock, and an aspiration episode that occurred in the hours leading up to a cardiac arrest. He was DNR and passed away despite supportive care that was ongoing including pressors. Family was notified when they were able to be reached several hours later and they came to the hospital.    Pertinent Labs and Studies  Significant Diagnostic Studies CT ABDOMEN WO CONTRAST  Result Date: 07/24/2021 CLINICAL DATA:  Evaluate anatomy for potential G-tube. EXAM: CT ABDOMEN WITHOUT CONTRAST TECHNIQUE: Multidetector CT imaging of the abdomen  was performed  following the standard protocol without IV contrast. RADIATION DOSE REDUCTION: This exam was performed according to the departmental dose-optimization program which includes automated exposure control, adjustment of the mA and/or kV according to patient size and/or use of iterative reconstruction technique. COMPARISON:  PET-CT 01/09/2009 FINDINGS: Lower chest: Trace right pleural fluid and at least a small left pleural effusion. There is extensive consolidation in the left lower lobe. Patchy airspace disease throughout the right lower lung with postsurgical changes in the right lung. Hepatobiliary: Trace perihepatic ascites. High-density material layering in the gallbladder is suggestive for cholelithiasis. No gross abnormality to the liver. Pancreas: Unremarkable. No pancreatic ductal dilatation or surrounding inflammatory changes. Spleen: Normal in size without focal abnormality. Adrenals/Urinary Tract: Normal appearance of the adrenal glands. Normal appearance of both kidneys without stones or hydronephrosis. Calcifications in the right renal hilum are probably vascular in etiology. Stomach/Bowel: Nasogastric feeding tube extends into the stomach and terminates in the proximal duodenum. There is a large amount of colon anterior to the stomach. There is no significant bowel distension. Limited evaluation for bowel inflammation. Vascular/Lymphatic: Diffuse atherosclerotic calcifications in the aorta and iliac arteries. Negative for an abdominal aortic aneurysm. Other: Trace perihepatic ascites. There may be trace ascites in the left upper quadrant of the abdomen. Negative for free air. Evidence for diffuse subcutaneous edema. Musculoskeletal: Compression fractures involving L1, L4 and L5 of unknown age. IMPRESSION: 1. Transverse colon is located between the anterior abdominal wall and the stomach. The colon location could preclude placement of a percutaneous gastrostomy tube. 2. Trace upper abdominal ascites. 3.  Extensive airspace disease in the right lower lung and a large amount of consolidation in the left lower lobe. Bilateral pleural effusions. Findings are concerning for bilateral pneumonia. 4. Lumbar vertebral body compression fractures of unknown age. Electronically Signed   By: Markus Daft M.D.   On: 07/24/2021 08:31   DG Chest 1 View  Result Date: 07/22/2021 CLINICAL DATA:  Pneumonia. EXAM: CHEST  1 VIEW COMPARISON:  07/20/2021 FINDINGS: The endotracheal tube remains in good position, 3.6 cm above the carina. The NG tube and feeding tube appears stable. Stable surgical changes involving the right lung. Persistent bilateral infiltrates. No pneumothorax. IMPRESSION: 1. Stable support apparatus. 2. Persistent bilateral infiltrates. Electronically Signed   By: Marijo Sanes M.D.   On: 07/22/2021 08:53   DG Chest 1 View  Result Date: 07/13/2021 CLINICAL DATA:  MRI screening. EXAM: CHEST  1 VIEW COMPARISON:  07/16/2009 and CT chest 12/19/2008. FINDINGS: Trachea is midline. Feeding tube is followed into the distal stomach. Heart size stable. Right infrahilar airspace consolidation with surgical clips or fiducial markers. No additional radiopaque foreign bodies surrounding hazy opacification in the right lung base. Difficult to exclude small bilateral pleural effusions. IMPRESSION: 1. Right perihilar masslike consolidation with surgical clips and/or fiducial markers. Disease recurrence cannot be excluded. Consider CT chest with contrast in further evaluation, as clinically indicated. 2. No additional radiopaque foreign bodies. Electronically Signed   By: Lorin Picket M.D.   On: 07/13/2021 12:57   DG Abd 1 View  Result Date: 07/20/2021 CLINICAL DATA:  Ileus. EXAM: ABDOMEN - 1 VIEW COMPARISON:  07/17/2021. FINDINGS: Feeding tube is coiled in the stomach with the tip in the pyloric region. Nasogastric tube terminates in the stomach. Left-sided femoral central line terminates near the iliac bifurcation. Gas is  seen in nondistended colon. Small bowel dilatation seen on 07/17/2021 has nearly completely resolved. Bibasilar airspace opacification in the visualized lungs, similar. IMPRESSION:  1. Resolving ileus or partial small bowel obstruction. 2. Bibasilar airspace opacification, better evaluated on chest radiograph done the same day. Electronically Signed   By: Lorin Picket M.D.   On: 07/20/2021 08:39   DG Abd 1 View  Result Date: 07/13/2021 CLINICAL DATA:  MRI screening. EXAM: ABDOMEN - 1 VIEW COMPARISON:  None. FINDINGS: Right hip arthroplasty. Otherwise, no radiopaque foreign body. Feeding tube terminates in the distal stomach. Stool is scattered in the colon. A tiny screw is seen in the soft tissues the proximal forearm. IMPRESSION: Right hip arthroplasty. Otherwise, no unexpected radiopaque foreign body in the visualized portions of the abdomen/pelvis. Electronically Signed   By: Lorin Picket M.D.   On: 07/13/2021 12:53   CT HEAD WO CONTRAST (5MM)  Result Date: 07/15/2021 CLINICAL DATA:  Mental status change, persistent or worsening. EXAM: CT HEAD WITHOUT CONTRAST TECHNIQUE: Contiguous axial images were obtained from the base of the skull through the vertex without intravenous contrast. RADIATION DOSE REDUCTION: This exam was performed according to the departmental dose-optimization program which includes automated exposure control, adjustment of the mA and/or kV according to patient size and/or use of iterative reconstruction technique. COMPARISON:  07/10/2021. FINDINGS: Brain: There is redemonstration of small subdural hematomas over the anterior and inferior frontal lobes bilaterally, greater on the right than on the left and not significantly changed from the prior exam. There is mild mass effect on the frontal lobe on the right. No midline shift is seen. Extensive subcortical and periventricular white matter hypodensities are present bilaterally. No hydrocephalus. Mild diffuse atrophy is noted.  Vascular: Atherosclerotic calcification of the vertebral arteries, basilar artery, and carotid siphons. No hyperdense vessel. Skull: No acute fracture. Craniotomy changes are present in the frontal region on the right. Sinuses/Orbits: No acute finding. Other: A nasogastric tube is noted. IMPRESSION: 1. Stable small bilateral subdural hematomas over the anterior and inferior aspects of the frontal lobes bilaterally not significantly changed from the prior exam. There is mild mass effect on the frontal lobe on the right with no midline shift. 2. Atrophy with chronic microvascular ischemic changes. Electronically Signed   By: Brett Fairy M.D.   On: 07/15/2021 00:38   CT HEAD WO CONTRAST (5MM)  Result Date: 07/10/2021 CLINICAL DATA:  Seizure, new onset. EXAM: CT HEAD WITHOUT CONTRAST TECHNIQUE: Contiguous axial images were obtained from the base of the skull through the vertex without intravenous contrast. RADIATION DOSE REDUCTION: This exam was performed according to the departmental dose-optimization program which includes automated exposure control, adjustment of the mA and/or kV according to patient size and/or use of iterative reconstruction technique. COMPARISON:  11/14/2005 FINDINGS: Brain: High to isodense subdural hematoma along the anterior and anterior right frontal convexity which measures up to 6 mm in thickness. No significant mass effect in the setting of brain atrophy. Brain atrophy is marked and significantly progressed from prior. Chronic asymmetric ischemia in the left occipital to parietal lobe. Confluent chronic small vessel ischemic gliosis in the deep white matter. No obstructive hydrocephalus or masslike finding. Vascular: No hyperdense vessel.  Atheromatous calcifications. Skull: Prior right-sided craniotomy, present on prior. There is severe C1-2 degeneration with subluxation and erosions, appearance similar to a November 2022 cervical spine CT. Sinuses/Orbits: No acute finding Critical  Value/emergent results were called by telephone at the time of interpretation on 07/10/2021 at 6:57 am to provider [facility] , who verbally acknowledged these results. IMPRESSION: 1. Acute or subacute subdural hematoma along the right frontal convexity measuring up to 6 mm  in thickness. No significant mass effect. 2. Advanced brain atrophy and chronic small vessel ischemia with significant progression from 2007. Electronically Signed   By: Gary Stevenson M.D.   On: 07/10/2021 06:57   MR ANGIO HEAD WO CONTRAST  Result Date: 07/19/2021 CLINICAL DATA:  Initial evaluation for neuro deficit, stroke. EXAM: MRA NECK WITHOUT CONTRAST MRA HEAD WITHOUT CONTRAST TECHNIQUE: Angiographic images of the Circle of Willis were acquired using MRA technique without intravenous contrast. COMPARISON:  None. FINDINGS: MRA NECK FINDINGS AORTIC ARCH: Examination technically limited by lack of IV contrast. Aortic arch and origin of the great vessels not well seen on this exam. RIGHT CAROTID SYSTEM: Visualized right CCA patent from its origin to the bifurcation without stenosis. Atheromatous irregularity about the right carotid bulb/proximal right ICA without hemodynamically significant stenosis. Right ICA patent distally without stenosis or evidence for dissection. LEFT CAROTID SYSTEM: Left CCA patent from its origin to the bifurcation without stenosis. Atheromatous irregularity about the left carotid bulb/proximal left ICA without hemodynamically significant stenosis. Left ICA patent distally without stenosis or evidence for dissection. VERTEBRAL ARTERIES: Both vertebral arteries arise from subclavian arteries. No visible proximal subclavian artery stenosis. Vertebral arteries largely code dominant and patent within the neck without stenosis or evidence for dissection. MRA HEAD FINDINGS ANTERIOR CIRCULATION: Visualized distal cervical segments of the internal carotid arteries are patent with antegrade flow. Petrous, cavernous, and  supraclinoid segments patent without stenosis. Approximate 5 mm wide necked outpouching arising from the cavernous left ICA suspicious for aneurysm (series 2, image 76). A1 segments patent bilaterally. Normal anterior communicating artery complex. Anterior cerebral arteries patent without stenosis. Normal in stenosis or occlusion. Normal MCA bifurcations. Distal MCA branches well perfused and symmetric. POSTERIOR CIRCULATION: Both V4 segments patent without stenosis. Both PICA origins patent and normal. Basilar patent without stenosis. Superior cerebellar arteries patent bilaterally. Both PCAs primarily supplied via the basilar well perfused or distal aspects. Extra-axial collections overlying the right greater than left frontal convexities again noted, grossly similar. Scattered ischemic infarcts also grossly similar to prior. IMPRESSION: MRA HEAD IMPRESSION: 1. Negative MRA for large vessel occlusion. No hemodynamically significant or correctable stenosis. 2. 5 mm wide necked outpouching extending from the cavernous left ICA, suspicious for possible aneurysm. MRA NECK IMPRESSION: 1. Atheromatous irregularity about the left greater than right carotid bifurcations without hemodynamically significant stenosis. 2. Wide patency of both vertebral arteries within the neck. 3. No evidence for dissection or other acute vascular abnormality. No identifiable embolic source. Electronically Signed   By: Jeannine Boga M.D.   On: 07/19/2021 19:30   MR ANGIO NECK WO CONTRAST  Result Date: 07/19/2021 CLINICAL DATA:  Initial evaluation for neuro deficit, stroke. EXAM: MRA NECK WITHOUT CONTRAST MRA HEAD WITHOUT CONTRAST TECHNIQUE: Angiographic images of the Circle of Willis were acquired using MRA technique without intravenous contrast. COMPARISON:  None. FINDINGS: MRA NECK FINDINGS AORTIC ARCH: Examination technically limited by lack of IV contrast. Aortic arch and origin of the great vessels not well seen on this exam.  RIGHT CAROTID SYSTEM: Visualized right CCA patent from its origin to the bifurcation without stenosis. Atheromatous irregularity about the right carotid bulb/proximal right ICA without hemodynamically significant stenosis. Right ICA patent distally without stenosis or evidence for dissection. LEFT CAROTID SYSTEM: Left CCA patent from its origin to the bifurcation without stenosis. Atheromatous irregularity about the left carotid bulb/proximal left ICA without hemodynamically significant stenosis. Left ICA patent distally without stenosis or evidence for dissection. VERTEBRAL ARTERIES: Both vertebral arteries arise from subclavian arteries.  No visible proximal subclavian artery stenosis. Vertebral arteries largely code dominant and patent within the neck without stenosis or evidence for dissection. MRA HEAD FINDINGS ANTERIOR CIRCULATION: Visualized distal cervical segments of the internal carotid arteries are patent with antegrade flow. Petrous, cavernous, and supraclinoid segments patent without stenosis. Approximate 5 mm wide necked outpouching arising from the cavernous left ICA suspicious for aneurysm (series 2, image 76). A1 segments patent bilaterally. Normal anterior communicating artery complex. Anterior cerebral arteries patent without stenosis. Normal in stenosis or occlusion. Normal MCA bifurcations. Distal MCA branches well perfused and symmetric. POSTERIOR CIRCULATION: Both V4 segments patent without stenosis. Both PICA origins patent and normal. Basilar patent without stenosis. Superior cerebellar arteries patent bilaterally. Both PCAs primarily supplied via the basilar well perfused or distal aspects. Extra-axial collections overlying the right greater than left frontal convexities again noted, grossly similar. Scattered ischemic infarcts also grossly similar to prior. IMPRESSION: MRA HEAD IMPRESSION: 1. Negative MRA for large vessel occlusion. No hemodynamically significant or correctable stenosis. 2.  5 mm wide necked outpouching extending from the cavernous left ICA, suspicious for possible aneurysm. MRA NECK IMPRESSION: 1. Atheromatous irregularity about the left greater than right carotid bifurcations without hemodynamically significant stenosis. 2. Wide patency of both vertebral arteries within the neck. 3. No evidence for dissection or other acute vascular abnormality. No identifiable embolic source. Electronically Signed   By: Jeannine Boga M.D.   On: 07/19/2021 19:30   MR BRAIN WO CONTRAST  Addendum Date: 07/19/2021   ADDENDUM REPORT: 07/19/2021 04:22 ADDENDUM: Study discussed by telephone with Gary Stevenson on 07/19/2021 at 0413 hours. Electronically Signed   By: Gary Stevenson M.D.   On: 07/19/2021 04:22   Result Date: 07/19/2021 CLINICAL DATA:  81 year old male with altered mental status. Subdural hematoma. Respiratory failure. EXAM: MRI HEAD WITHOUT CONTRAST TECHNIQUE: Multiplanar, multiecho pulse sequences of the brain and surrounding structures were obtained without intravenous contrast. COMPARISON:  Brain MRI 07/15/2021 and earlier. FINDINGS: Brain: Right greater than left subfrontal hematomas have not significantly changed since 07/15/2021 (series 11, image 13). That on the right measures 8-9 mm in thickness, on the left 3 mm. There is also trace right subdural in middle cranial fossa, unchanged. Stable mild regional mass effect. But there is extensive new restricted diffusion indicating acute infarcts widely scattered bilateral cerebellum (series 5, image 61) involving nearly the entire left calf pons (series 5, image 65) affecting both occipital poles (series 5, image 69) and posterior cortex and subcortical white matter of the parietal lobes more so the right (series 5, image 79). Deep gray nuclei are spared. Anterior circulation is spared (DWI artifact related to subdural hematoma). Cytotoxic edema in the affected areas. No definite acute hemorrhage. Scattered chronic  microhemorrhages in the posterior fossa, deep gray nuclei, parietal and occipital lobes are not significantly changed allowing for SWI imaging today. Basilar cisterns remain patent. Stable ventriculomegaly, probably ex vacuo related. Stable Patchy and confluent cerebral white matter T2 and FLAIR hyperintensity. No midline shift, evidence of mass lesion. Cervicomedullary junction and pituitary are within normal limits. Vascular: Major intracranial vascular flow voids are preserved. Skull and upper cervical spine: Stable pronounced degenerative ligamentous hypertrophy about the odontoid. Visualized bone marrow signal is within normal limits. Sinuses/Orbits: Stable, negative. Other: Intubated. Right nasoenteric tube in place. Bilateral mastoid effusions have mildly increased. IMPRESSION: 1. Extensive acute infarcts in the bilateral posterior circulation. Subtotal involvement of the left pons. Scattered bilateral cerebellar, occipital, and parietal lobe involvement. Cytotoxic edema without acute hemorrhage. No significant  mass effect at this time. 2. Anterior circulation is spared. Pattern suggests a recent embolic event to the posterior circulation. 3. Right > left mostly subfrontal SDH is stable since 07/15/2021. No significant mass effect. 4. Underlying cerebral volume loss, chronic micro-hemorrhages and chronic white matter disease. Electronically Signed: By: Gary Stevenson M.D. On: 07/19/2021 04:06   MR BRAIN WO CONTRAST  Result Date: 07/15/2021 CLINICAL DATA:  Mental status changes, cause. EXAM: MRI HEAD WITHOUT CONTRAST TECHNIQUE: Multiplanar, multiecho pulse sequences of the brain and surrounding structures were obtained without intravenous contrast. COMPARISON:  Head CT July 15, 2021; MRI of the brain July 13, 2021. FINDINGS: Brain: Subacute subdural hematomas in the bilateral anterior frontal region measuring up to 10 mm on the right and 3 mm on the left, compared to 10 mm on the right and 5 mm on the left  on prior MRI. No new acute hemorrhage identified. No acute infarct, mass lesion or midline shift. Prominent supratentorial ventricles is likely related to central parenchymal volume loss. Scattered and confluent T2 hyperintensity of the white matter of the cerebral hemispheres is unchanged. Vascular: Normal flow voids. Skull and upper cervical spine: Normal marrow signal. Sinuses/Orbits: Paranasal sinuses are essentially clear. Bilateral mastoid effusion. The orbits are maintained. Other: None. IMPRESSION: 1. Stable bilateral frontal subacute subdural hematoma. No midline shift. 2. No new intracranial abnormality. 3. Extensive chronic white matter disease and parenchymal volume loss, unchanged. Electronically Signed   By: Pedro Earls M.D.   On: 07/15/2021 17:15   MR BRAIN WO CONTRAST  Result Date: 07/13/2021 CLINICAL DATA:  Altered mental status.  Subdural hematoma. EXAM: MRI HEAD WITHOUT CONTRAST TECHNIQUE: Multiplanar, multiecho pulse sequences of the brain and surrounding structures were obtained without intravenous contrast. COMPARISON:  Head CT 07/10/2021 FINDINGS: Brain: T1 hyperintense subdural hematomas over the anterior frontal convexities extend inferiorly along the floor of the anterior cranial fossa bilaterally and also extend into the middle cranial fossa on the right. These subdural hematomas measure up to 8 mm in thickness on the right and 5 mm on the left. There is very mild mass effect on the anterior inferior right frontal lobe. Scattered chronic microhemorrhages are noted in both cerebral hemispheres, including in the deep gray nuclei, and in the brainstem and cerebellum suggesting chronic hypertension. No acute infarct, mass, or midline shift is evident. Confluent T2 hyperintensities in the cerebral white matter and patchy T2 hyperintensities in the pons are nonspecific but compatible with extensive chronic small vessel ischemic disease with asymmetric involvement noted in  the left parieto-occipital region. There is advanced cerebral atrophy. Vascular: Major intracranial vascular flow voids are preserved. Skull and upper cervical spine: Lateral right frontal craniotomy. Advanced C1-2 arthropathy. Sinuses/Orbits: Unremarkable orbits. Small right mastoid effusion. Largely clear paranasal sinuses. Other: None. IMPRESSION: 1. Small bilateral subdural hematomas. Slight mass effect on the right frontal lobe. No midline shift. 2. Extensive chronic small vessel ischemic disease and cerebral atrophy. Electronically Signed   By: Logan Bores M.D.   On: 07/13/2021 14:19   US RENAL  Result Date: 07/17/2021 CLINICAL DATA:  Acute renal insufficiency EXAM: RENAL / URINARY TRACT ULTRASOUND COMPLETE COMPARISON:  None. FINDINGS: Right Kidney: Renal measurements: 7.8 x 5.5 x 6.6 cm = volume: 148 mL. Increased renal echogenicity. No hydronephrosis. Left Kidney: Renal measurements: 5.5 x 4.1 x 4.1 cm = volume: 47.1 mL. Increased renal echogenicity. Suspect renal atrophy, although the left kidney is poorly visualized secondary to adjacent bowel gas. Bladder: Appears normal for degree of  bladder distention. Other: Trace perinephric fluid and small volume abdominal ascites. Incidental note is made of a right-sided pleural effusion. IMPRESSION: Increased renal echogenicity, suggesting medical renal disease. Suboptimal evaluation of the left kidney.  Favor left renal atrophy. Right pleural effusion with small volume ascites, suggesting fluid overload. Electronically Signed   By: Abigail Miyamoto M.D.   On: 07/17/2021 11:56   DG Pelvis Portable  Result Date: 07/25/2021 CLINICAL DATA:  Status post total right hip arthroplasty EXAM: PORTABLE PELVIS 1-2 VIEWS COMPARISON:  07/14/2021 intraoperative fluoroscopy, 07/04/2021 CT right hip FINDINGS: Interval total right hip arthroplasty. Expected postoperative changes including surgical skin staples, soft tissue swelling, and subcutaneous air. No perihardware  lucency is seen to indicate hardware failure or loosening. Vascular calcifications are noted. Mild left femoroacetabular joint space narrowing. Diffuse decreased bone mineralization. IMPRESSION: Interval total right hip arthroplasty without evidence of hardware failure. Electronically Signed   By: Yvonne Kendall   On: 07/20/2021 18:48   CT HIP RIGHT WO CONTRAST  Result Date: 06/29/2021 CLINICAL DATA:  Right hip fracture, surgical planning EXAM: CT OF THE RIGHT HIP WITHOUT CONTRAST TECHNIQUE: Multidetector CT imaging of the right hip was performed according to the standard protocol. Multiplanar CT image reconstructions were also generated. RADIATION DOSE REDUCTION: This exam was performed according to the departmental dose-optimization program which includes automated exposure control, adjustment of the mA and/or kV according to patient size and/or use of iterative reconstruction technique. COMPARISON:  05/27/2021 FINDINGS: Bones/Joint/Cartilage Anterior column fracture of the right acetabulum noted with segmental involvement of the anterior wall and vertical component extending cephalad in the right iliac bone extending towards the anterior portion of the right SI joint, and with transverse fracture of the inferior pubic ramus. Equivocal posterior column involvement with scalloping the posterior acetabular margin and bony demineralization with some subtle areas of cortical irregularity. There is some signs of interval healing response with demineralization along the fracture planes and some mild osteoid deposition along the anterior column fracture planes. Acetabular demineralization with potential comminuted small fracture fragments along the acetabular roof for example on image 33 series 7. Right femoral neck fracture is observed with varus and apex anterior angulation, as well as resorption of much of the right femoral neck and a substantial portion of the right femoral head especially superiorly. This results  in a moderate gap between the remaining femoral head fragment in the inter trochanteric region, with pseudoarticulation of a small extension of the femoral neck with the inter trochanteric region shown on image 36 series 7. Substantial increase in osteoid deposition within the distended joint capsule. Substantial osteoid deposition in the right hip adductor musculature potentially a harbinger of heterotopic ossification. Ligaments Suboptimally assessed by CT. Muscles and Tendons Substantial osteoid deposition and probably early heterotopic calcification in the right hip adductor musculature. Soft tissues Dense atherosclerotic calcifications. Suspected right posterior bladder diverticulum. IMPRESSION: 1. Comminuted anterior column fracture of the right acetabulum, likely with involvement of the acetabular roof and with questionable posterior column involvement. Substantial erosion along the acetabulum with increased deposition of osteoid in the distended joint. 2. Femoral neck fracture with resorption of most of the femoral neck and a portion of the femoral head. 3. Osteoid deposition of possibly early heterotopic calcification in the right hip adductor musculature. 4. Extensive atherosclerosis. Electronically Signed   By: Gary Stevenson M.D.   On: 07/23/2021 07:59   DG Chest Port 1 View  Result Date: 07/23/2021 CLINICAL DATA:  Tracheostomy placement EXAM: PORTABLE CHEST 1 VIEW COMPARISON:  07/22/2021 FINDINGS: Interval removal of ET tube and placement of tracheostomy tube which appears appropriately position. Previously seen small bore enteric tube has been removed. Large bore feeding tube remains in place with distal tip extending beyond the inferior margin of the film. Normal heart size. Persistent patchy bibasilar opacities, right worse than left. Small bilateral pleural effusions, left greater than right. No pneumothorax. IMPRESSION: 1. Interval placement of tracheostomy tube. 2. Persistent patchy  bibasilar opacities and small bilateral pleural effusions. Electronically Signed   By: Davina Poke D.O.   On: 07/23/2021 12:48   DG Chest Port 1 View  Result Date: 07/20/2021 CLINICAL DATA:  81 year old male with history of acute respiratory failure. EXAM: PORTABLE CHEST 1 VIEW COMPARISON:  Chest x-ray 07/19/2021. FINDINGS: An endotracheal tube is in place with tip 3.8 cm above the carina. A nasogastric tube is seen extending into the stomach. Patchy multifocal airspace consolidation is noted throughout the right mid to lower lung and at the left lung base. Small bilateral pleural effusions. No pneumothorax. No evidence of pulmonary edema. Heart size is normal. The patient is rotated to the right on today's exam, resulting in distortion of the mediastinal contours and reduced diagnostic sensitivity and specificity for mediastinal pathology. IMPRESSION: 1. Support apparatus, as above. 2. Multilobar bilateral pneumonia (right greater than left) with small bilateral pleural effusions. Electronically Signed   By: Vinnie Langton M.D.   On: 07/20/2021 07:51   DG Chest Port 1 View  Result Date: 07/19/2021 CLINICAL DATA:  Acute respiratory failure EXAM: PORTABLE CHEST 1 VIEW COMPARISON:  Chest radiograph 07/25/2021. FINDINGS: ETT distal trachea. Enteric tube courses inferior to the diaphragm. Monitoring leads overlie the patient. Stable cardiac and mediastinal contours. Similar-appearing bilateral predominately mid lower lung airspace opacities. No large pleural effusion or pneumothorax. IMPRESSION: Similar-appearing bilateral airspace opacities. ETT distal trachea. Electronically Signed   By: Lovey Newcomer M.D.   On: 07/19/2021 08:08   DG Chest Port 1 View  Result Date: 07/17/2021 CLINICAL DATA:  Acute respiratory failure. EXAM: PORTABLE CHEST 1 VIEW COMPARISON:  July 16, 2021 FINDINGS: There is stable endotracheal tube and enteric tube positioning. Low lung volumes are seen with stable moderate  severity atelectasis, scarring and/or infiltrate seen within the right lung base. Right perihilar and right infrahilar surgical sutures are noted. A small right pleural effusion is suspected. No pneumothorax is identified. The heart size and mediastinal contours are within normal limits. The visualized skeletal structures are unremarkable. IMPRESSION: 1. Stable moderate severity right basilar atelectasis, scarring and/or multifocal infiltrate. 2. Small right pleural effusion. Electronically Signed   By: Virgina Norfolk M.D.   On: 07/17/2021 22:29   DG CHEST PORT 1 VIEW  Result Date: 07/16/2021 CLINICAL DATA:  81 year old male with respiratory failure, hypoxia. Recent hip replacement, subdural hematoma. EXAM: PORTABLE CHEST 1 VIEW COMPARISON:  Portable chest 07/14/2021 and earlier. FINDINGS: Portable AP semi upright view at 0426 hours. ET tube and enteric feeding tube appear stable and satisfactory. Ongoing dense retrocardiac opacity on the left compatible with lower lobe collapse or consolidation. Substantially regressed right upper lobe collapse since 2026 hours yesterday, but patchy residual right upper and lower lung opacity. No pneumothorax. No definite pleural effusion. Stable visualized osseous structures. IMPRESSION: 1. Stable lines and tubes. 2. Largely resolved right upper lobe collapse since 2023 hours yesterday. But ongoing patchy multifocal right lung opacity suspicious for multilobar pneumonia, and continued left lower lobe collapse or consolidation. Electronically Signed   By: Gary Stevenson M.D.   On: 07/16/2021  08:33   DG CHEST PORT 1 VIEW  Result Date: 07/14/2021 CLINICAL DATA:  NG tube placement. EXAM: PORTABLE CHEST 1 VIEW, abdomen portable. COMPARISON:  07/13/2021. FINDINGS: The heart is stable in size and mediastinal contours are within normal limits. Atherosclerotic calcification of the aorta is noted. Interstitial and airspace opacities are present in the right lung and significantly  improved from the prior exam. There is minimal airspace disease at the left lung base. Blunting of the costophrenic angles are noted bilaterally suggesting small bilateral pleural effusions. Surgical changes are noted in the mid right lung. No pneumothorax. An endotracheal tube terminates 2.5 cm above the carina. A feeding tube and enteric tube are noted in the stomach. No bowel obstruction is seen. A catheter is seen over the pelvis on the left terminating to the right of midline. Total hip arthroplasty changes are noted on the right. IMPRESSION: 1. Interval placement of nasogastric tube and endotracheal tubes and remaining medical devices as described above. 2. Interstitial and airspace opacities in the right lungs with markedly improved aeration as compared with the previous exam. Findings may represent edema or infiltrate. Minimal residual airspace disease is noted at the left lung base. 3. Small bilateral pleural effusions. Electronically Signed   By: Brett Fairy M.D.   On: 07/14/2021 22:59   DG CHEST PORT 1 VIEW  Result Date: 07/14/2021 CLINICAL DATA:  Respiratory distress. EXAM: PORTABLE CHEST 1 VIEW COMPARISON:  07/13/2021. FINDINGS: The heart is stable in size. There is reduced lung volume on the right with mediastinal shift to the right. Postsurgical changes are noted in the mid right lung. There is near complete opacification of the right lung with mild residual aeration at the right lung base. A small to moderate pleural effusion is noted at the right lung base. Hazy airspace disease is noted at the left lung base. No pneumothorax. No acute osseous abnormality. An enteric tube terminates in the distal stomach. IMPRESSION: 1. Reduced lung volume on the right with mediastinal shift to the right. 2. Near complete opacification of the right lung with mild residual aeration at the right lung base, possible atelectasis or infiltrate. Mild airspace disease is also present at the left lung base. 3. Small  moderate right pleural effusion. 4. Postsurgical changes in the mid right lung. Electronically Signed   By: Brett Fairy M.D.   On: 07/14/2021 20:48   DG Abd Portable 1V  Result Date: 07/17/2021 CLINICAL DATA:  Orogastric tube placement EXAM: PORTABLE ABDOMEN - 1 VIEW COMPARISON:  07/17/2021 FINDINGS: Nasogastric tube tip is seen within the a proximal to mid body of the stomach. Nasoenteric feeding tube appears looped within the gastric fundus with its tip in the expected gastric antrum. Multiple gas-filled loops of small bowel are seen throughout the abdomen with a relative paucity of gas within the large bowel suggesting a distal partial or developing complete small bowel obstruction. The pelvis excluded from view. Left femoral central venous catheter tip noted in the expected caval confluence. IMPRESSION: Nasogastric tube tip within the mid body of the stomach. Nasoenteric feeding tube tip within the gastric antrum. Suspected distal partial or developing complete small bowel obstruction. Electronically Signed   By: Fidela Salisbury M.D.   On: 07/17/2021 23:13   DG Abd Portable 1V  Result Date: 07/17/2021 CLINICAL DATA:  Feeding tube placement. EXAM: PORTABLE ABDOMEN - 1 VIEW COMPARISON:  Radiographs 07/17/2021 and 07/14/2021. FINDINGS: 1554 hours. The feeding tube has been advanced with the tip now overlying the right upper  quadrant, likely in the distal stomach or proximal duodenum. Persistent mild gastric and small bowel distension. Persistent bilateral pleural effusions and bibasilar pulmonary opacities. IMPRESSION: Interval advancement of the feeding tube to the level of the distal stomach or proximal duodenum. Electronically Signed   By: Richardean Sale M.D.   On: 07/17/2021 16:07   DG Abd Portable 1V  Result Date: 07/17/2021 CLINICAL DATA:  Vomiting, abdominal distention EXAM: PORTABLE ABDOMEN - 1 VIEW COMPARISON:  07/14/2021 FINDINGS: There is interval removal of 1 of 2 enteric tubes. Tip of  remaining enteric tube is seen in the area of lateral aspect of fundus of the stomach. There is moderate gaseous distention of stomach which was not evident in the previous study. There is interval worsening of small bowel dilation measuring up to 3.9 cm. Gas and stool are noted in the area of splenic flexure. No abnormal masses are seen. Vascular calcifications are seen. There is right hip arthroplasty. Skin staples are seen in the right hip. There is a vascular catheter in the course of left iliac vessels with no significant interval change. IMPRESSION: Tip of enteric tube is seen in the fundus of the stomach. There is interval moderate gaseous distention of stomach. There is interval worsening of small bowel dilation. Findings suggest ileus or partial distal small bowel obstruction. Electronically Signed   By: Elmer Picker M.D.   On: 07/17/2021 14:01   DG Abd Portable 1V  Result Date: 07/14/2021 CLINICAL DATA:  NG tube placement. EXAM: PORTABLE CHEST 1 VIEW, abdomen portable. COMPARISON:  07/13/2021. FINDINGS: The heart is stable in size and mediastinal contours are within normal limits. Atherosclerotic calcification of the aorta is noted. Interstitial and airspace opacities are present in the right lung and significantly improved from the prior exam. There is minimal airspace disease at the left lung base. Blunting of the costophrenic angles are noted bilaterally suggesting small bilateral pleural effusions. Surgical changes are noted in the mid right lung. No pneumothorax. An endotracheal tube terminates 2.5 cm above the carina. A feeding tube and enteric tube are noted in the stomach. No bowel obstruction is seen. A catheter is seen over the pelvis on the left terminating to the right of midline. Total hip arthroplasty changes are noted on the right. IMPRESSION: 1. Interval placement of nasogastric tube and endotracheal tubes and remaining medical devices as described above. 2. Interstitial and  airspace opacities in the right lungs with markedly improved aeration as compared with the previous exam. Findings may represent edema or infiltrate. Minimal residual airspace disease is noted at the left lung base. 3. Small bilateral pleural effusions. Electronically Signed   By: Brett Fairy M.D.   On: 07/14/2021 22:59   EEG adult  Result Date: 07/15/2021 Kennon Portela, MD     07/15/2021  8:35 PM [facility] Routine EEG Report Patient Name:   Babe, Clenney Date of Birth:   1941/05/04 Identification Number:   MRN - [id number] Date of Study:   07/15/2021 11:08:41 Indication: Encephalopathy, Technical Stevenson: A routine 20 channel electroencephalogram using the international 10-20 system of electrode placement was performed. States      Coma Abnormalities Burst Suppression: Burst suppression like pattern with bursts of polymorphic delta/theta activity noted every five seconds. Activation Procedures Hyperventilation: Not performed Photic Stimulation: Not performed Classification: Abnormal : Diagnosis: This is a severely abnormal comatose EEG due to burst suppression pattern. Differential includes ICU sedative medication use or anoxia if no sedation. No epileptiform discharges. Clinical correlation is advised. Dr  Kennon Portela [facility] [phone] Case [id number]  EEG adult  Result Date: 07/10/2021 Lora Havens, MD     07/10/2021  5:25 PM Patient Name: ROXIE GUEYE MRN: [id number] Epilepsy Attending: Lora Havens Referring Physician/Provider: Lennie Hummer, PA-C Date: 07/10/2021 Duration: 29.11 mins Patient history: 81yo m with right SDH, now with seizure like activity. EEG to evaluate for seizure Level of alertness: lethargic AEDs during EEG study: LEV Technical aspects: This EEG study was done with scalp electrodes positioned according to the 10-20 International system of electrode placement. Electrical activity was acquired at a sampling rate of _0   and reviewed with a high frequency filter of _1  and a low frequency filter of _2 . EEG data were recorded continuously and digitally stored. Description: No posterior dominant rhythm was seen. EEG showed continuous low amplitude 3 to 6 Hz theta-delta slowing In left hemisphere. EEG also showed 3-_3  theta-delta slowing in right hemisphere which is sharply contoured and rhythmic. There  is also intermittent 15-_4  beta activity in right hemisphere. Hyperventilation and photic stimulation were not performed.   ABNORMALITY - Lateralized rhythmic delta activity, right hemisphere ((LRDA) - Continuous slow, generalized IMPRESSION: This study is suggestive of cortical dysfunction arising from right hemisphere likely secondary to underlying SDH. The rhythmic delta activity in right hemisphere ( LRDA) is on the ictal-interictal continuum with low to intermediate potential for seizure. There is also moderate diffuse encephalopathy, nonspecific etiology. No definite seizures or epileptiform discharges were seen throughout the recording. If suspicion for interictal activity remains a concern, a prolonged study should be considered. Priyanka Barbra Sarks   Overnight EEG with video  Result Date: 07/23/2021 Lora Havens, MD     07/24/2021  9:37 AM Patient Name: HARVY RIERA MRN: [id number] Epilepsy Attending: Lora Havens Referring Physician/Provider: Dr Rosalin Hawking Duration: 07/22/2021 1608 to 07/23/2021 1608  Patient history: 81yo m with right SDH, now with seizure like activity. EEG to evaluate for seizure  Level of alertness: comatose  AEDs during EEG study: LEV, phenobarb  Technical aspects: This EEG study was done with scalp electrodes positioned according to the 10-20 International system of electrode placement. Electrical activity was acquired at a sampling rate of _5  and reviewed with a high frequency filter of _6  and a low frequency filter of _7 . EEG data were recorded continuously and digitally stored.   Description: No posterior dominant rhythm was seen.  EEG showed continuous generalized and lateralized right hemisphere 3 to 6 Hz theta-delta slowing admixed with intermittent generalized 15 to 18 Hz beta activity.  Bilaterally independent periodic discharges with epileptiform morphology were also seen in left and right hemisphere intermittently at 1 to 2 Hz, more prominent when awake/stimulated.  Hyperventilation and photic stimulation were not performed.   ABNORMALITY - Bilateral independent periodic discharges, left and right hemisphere - Continuous slow, generalized and lateralized right hemisphere  IMPRESSION: This study showed evidence of of independent epileptogenicity arising from left and right hemisphere.  This EEG pattern is on the ictal-interictal continuum with low to intermediate potential for seizures.  Additionally there is evidence of moderate to severe diffuse encephalopathy, nonspecific etiology.  No definite seizures were seen during the study. Priyanka Barbra Sarks    Overnight EEG with video  Result Date: 07/17/2021 Samuella Cota, MD     07/17/2021  9:00 AM EEG Procedure CPT/Type of Study: 25956; 24hr EEG with video Referring Provider: Elgergawy Primary Neurological Diagnosis: seizure, status epilepticus, SDH History: This is a 81 yr old patient, undergoing an EEG  to evaluate for SDH, seizures. Clinical State: disoriented Technical Description: The EEG was performed using standard setting per the guidelines of American Clinical Neurophysiology Society (ACNS). A minimum of 21 electrodes were placed on scalp according to the International 10-20 or/and 10-10 Systems. Supplemental electrodes were placed as needed. Single EKG electrode was also used to detect cardiac arrhythmia. Patient's behavior was continuously recorded on video simultaneously with EEG. A minimum of 16 channels were used for data display. Each epoch of study was reviewed manually daily and as needed using standard referential and  bipolar montages. Computerized quantitative EEG analysis (such as compressed spectral array analysis, trending, automated spike & seizure detection) were used as indicated. Day 1: from 1134 07/16/21 to 0730 07/17/21 EEG Description: Overall Amplitude:Normal Predominant Frequency: The background activity showed theta, with about 4-6 Hz, that was frequent. Superimposed Frequencies: occasional delta and some beta activity bilaterally The background was symmetric Background Abnormalities: focal slowing; right central delta-theta focal slowing Rhythmic or periodic pattern: Bilateral independent periodic discharges; spiky 1-_0  BIPDs with some subtle ictal evolution during the initial portion of the record, improved Epileptiform activity: Yes; spiky morphology to BIPDs, improved overnight Electrographic seizures: Yes; subtle subclinical ictal evolution to left and right central BIPDs during the initial portion of the recording with buildup of rhythmic _1  fast activity. These were resolved in the afternoon and evening. Events: no Breach rhythm: no Reactivity: Present Stimulation procedures: Hyperventilation: not done Photic stimulation: not done Sleep Background: Stage II EKG:no significant arrhythmia Impression: This was an abnormal continuous video EEG due to BIPDs with some brief electrographic seizures during the initial portion of the recording, indicative of a multifocal epileptogenic disturbance. Seizures were improved in the afternoon and evening.   Overnight EEG with video  Result Date: 07/10/2021 Lora Havens, MD     07/11/2021  9:45 AM Patient Name: Gary Stevenson MRN: [id number] Epilepsy Attending: Lora Havens Referring Physician/Provider: Dr Amie [place] Duration: 07/10/2021 2018 to 07/11/2021 0730  Patient history: 81yo m with right SDH, now with seizure like activity. EEG to evaluate for seizure  Level of alertness: lethargic  AEDs during EEG study: LEV, phenobarb  Technical aspects: This EEG study  was done with scalp electrodes positioned according to the 10-20 International system of electrode placement. Electrical activity was acquired at a sampling rate of _2  and reviewed with a high frequency filter of _3  and a low frequency filter of _4 . EEG data were recorded continuously and digitally stored.  Description: No posterior dominant rhythm was seen. EEG showed continuous low amplitude 3 to 6 Hz theta-delta slowing In left hemisphere.  EEG also showed sharp waves in right frontal region which appeared quasi periodic at _5  and rhythmic admixed with 3-_6  theta-delta slowing with evolution in morphology and frequency. 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 Phenobarbital load was give at around 0200 on 07/11/2021 after which eeg improved. EEG then showed polymorphic mixed frequencies in right hemisphere with predominantly 3-_7  theta-delta slowing admixed with 15-_8  beta activity. Abundant sharp waves were also noted in right hemisphere, maximal right frontal region which were qasi periodic at 0.5-_9 .  Hyperventilation and photic stimulation were not performed.    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 Phenobarbital load was give at around  0200 on 07/11/2021 after which eeg improved and showed evidence of epileptogenicity and cortical dysfunction in right hemisphere, maximal right frontal region with high potential for seizure recurrence. There was also moderate diffuse encephalopathy, nonspecific etiology but likely due to seizure. Dr Gary Stevenson was notified.  Lora Havens    DG C-Arm 1-60 Min-No Report  Result Date: 07/22/2021 Fluoroscopy  was utilized by the requesting physician.  No radiographic interpretation.   DG C-Arm 1-60 Min-No Report  Result Date: 07/19/2021 Fluoroscopy was utilized by the requesting physician.  No radiographic interpretation.   ECHOCARDIOGRAM COMPLETE  Result Date: 07/18/2021    ECHOCARDIOGRAM REPORT   Patient Name:   Gary Stevenson Date of Exam: 07/18/2021 Medical Rec #:  [id number]      Height:       65.0 in Accession #:    [id number]     Weight:       106.5 lb Date of Birth:  Jan 21, 1941      BSA:          1.513 m Patient Age:    30 years       BP:           116/67 mmHg Patient Gender: M              HR:           92 bpm. Exam Location:  Inpatient Procedure: 2D Echo, Cardiac Doppler and Color Doppler STAT ECHO Indications:    Pulmonary embolus  History:        Patient has prior history of Echocardiogram examinations. COPD;                 Risk Factors:Hypertension and Former Smoker. ETOH abuse.  Sonographer:    Clayton Lefort RDCS (AE) Referring Phys: [id number] Frederik Pear  Sonographer Comments: Technically difficult study due to poor echo windows and suboptimal apical window. IMPRESSIONS  1. Left ventricular ejection fraction, by estimation, is 50 to 55%. The left ventricle has low normal function. The left ventricle demonstrates global hypokinesis. Left ventricular diastolic parameters were normal.  2. Right ventricular systolic function is mildly reduced. The right ventricular size is mildly enlarged.  3. The mitral valve is abnormal. Mild mitral valve regurgitation. No evidence of mitral stenosis.  4. The aortic valve is tricuspid. There is mild calcification of the aortic valve. Aortic valve regurgitation is not visualized. Aortic valve sclerosis is present, with no evidence of aortic valve stenosis.  5. The inferior vena cava is normal in size with greater than 50% respiratory variability, suggesting right atrial pressure of 3 mmHg. FINDINGS  Left Ventricle: Left ventricular ejection fraction, by estimation, is  50 to 55%. The left ventricle has low normal function. The left ventricle demonstrates global hypokinesis. The left ventricular internal cavity size was normal in size. There is no left ventricular hypertrophy. Left ventricular diastolic parameters were normal. Right Ventricle: The right ventricular size is mildly enlarged. No increase in right ventricular wall thickness. Right ventricular systolic function is mildly reduced. Left Atrium: Left atrial size was normal in size. Right Atrium: Right atrial size was normal in size. Pericardium: There is no evidence of pericardial effusion. Mitral Valve: The mitral valve is abnormal. There is mild thickening of the mitral valve leaflet(s). Mild mitral valve regurgitation. No evidence of mitral valve stenosis. Tricuspid Valve: The tricuspid valve is normal in structure. Tricuspid valve regurgitation is mild . No evidence of tricuspid stenosis. Aortic Valve: The aortic valve is tricuspid. There is  mild calcification of the aortic valve. Aortic valve regurgitation is not visualized. Aortic valve sclerosis is present, with no evidence of aortic valve stenosis. Aortic valve mean gradient measures 3.0 mmHg. Aortic valve peak gradient measures 5.2 mmHg. Aortic valve area, by VTI measures 2.92 cm. Pulmonic Valve: The pulmonic valve was normal in structure. Pulmonic valve regurgitation is not visualized. No evidence of pulmonic stenosis. Aorta: The aortic root is normal in size and structure. Venous: The inferior vena cava is normal in size with greater than 50% respiratory variability, suggesting right atrial pressure of 3 mmHg. IAS/Shunts: The interatrial septum was not well visualized.  LEFT VENTRICLE PLAX 2D LVIDd:         4.50 cm   Diastology LVIDs:         3.40 cm   LV e' medial:  5.03 cm/s LV PW:         1.10 cm   LV e' lateral: 6.83 cm/s LV IVS:        1.10 cm LVOT diam:     2.00 cm LV SV:         54 LV SV Index:   36 LVOT Area:     3.14 cm  RIGHT VENTRICLE RV Basal diam:   2.00 cm RV S prime:     12.40 cm/s TAPSE (M-mode): 1.1 cm LEFT ATRIUM           Index        RIGHT ATRIUM           Index LA diam:      3.30 cm 2.18 cm/m   RA Area:     13.10 cm LA Vol (A4C): 27.9 ml 18.44 ml/m  RA Volume:   28.40 ml  18.77 ml/m  AORTIC VALVE AV Area (Vmax):    3.75 cm AV Area (Vmean):   3.32 cm AV Area (VTI):     2.92 cm AV Vmax:           114.00 cm/s AV Vmean:          74.000 cm/s AV VTI:            0.184 m AV Peak Grad:      5.2 mmHg AV Mean Grad:      3.0 mmHg LVOT Vmax:         136.00 cm/s LVOT Vmean:        78.200 cm/s LVOT VTI:          0.171 m LVOT/AV VTI ratio: 0.93  AORTA Ao Root diam: 3.60 cm TRICUSPID VALVE TR Peak grad:   82.4 mmHg TR Vmax:        454.00 cm/s  SHUNTS Systemic VTI:  0.17 m Systemic Diam: 2.00 cm Jenkins Rouge MD Electronically signed by Jenkins Rouge MD Signature Date/Time: 07/18/2021/1:02:58 PM    Final    ECHO TEE  Result Date: 07/22/2021    TRANSESOPHOGEAL ECHO REPORT   Patient Name:   Gary Stevenson Date of Exam: 07/22/2021 Medical Rec #:  [id number]      Height:       65.0 in Accession #:    [id number]     Weight:       96.6 lb Date of Birth:  February 17, 1941      BSA:          1.452 m Patient Age:    34 years       BP:           108/69 mmHg Patient  Gender: M              HR:           98 bpm. Exam Location:  Inpatient Procedure: Transesophageal Echo, Color Doppler and Cardiac Doppler Indications:     Bacteremia  History:         Patient has prior history of Echocardiogram examinations, most                  recent 07/18/2021. COPD; Risk Factors:Hypertension.  Sonographer:     Raquel Sarna Senior RDCS Referring Phys:  [id number] Shaune Pascal BENSIMHON Diagnosing Phys: Gary Bickers MD PROCEDURE: After discussion of the risks and benefits of a TEE, an informed consent was obtained from the patient. The transesophogeal probe was passed without difficulty through the esophogus of the patient. Sedation performed by performing physician. The patient developed no complications during  the procedure. No sedation required. IMPRESSIONS  1. Left ventricular ejection fraction, by estimation, is 55 to 60%. The left ventricle has normal function.  2. Right ventricular systolic function is normal. The right ventricular size is normal.  3. Left atrial size was moderately dilated. No left atrial/left atrial appendage thrombus was detected.  4. Right atrial size was moderately dilated.  5. No vegetation. The mitral valve is normal in structure. Trivial mitral valve regurgitation.  6. There is a filamentous-like lesion underneath the tricuspird valve which I suspect is part of the chordal structure but cannot completely exclude vegetation.  7. No vegetation. The aortic valve is normal in structure. Aortic valve regurgitation is trivial.  8. No vegetation. FINDINGS  Left Ventricle: Left ventricular ejection fraction, by estimation, is 55 to 60%. The left ventricle has normal function. The left ventricular internal cavity size was normal in size. Right Ventricle: The right ventricular size is normal. No increase in right ventricular wall thickness. Right ventricular systolic function is normal. Left Atrium: Left atrial size was moderately dilated. No left atrial/left atrial appendage thrombus was detected. Right Atrium: Right atrial size was moderately dilated. Pericardium: There is no evidence of pericardial effusion. Mitral Valve: No vegetation. The mitral valve is normal in structure. Trivial mitral valve regurgitation. Tricuspid Valve: There is a filamentous-like lesion underneath the tricuspird valve which I suspect is part of the chordal structure but cannot completely exclude vegetation. The tricuspid valve is normal in structure. Tricuspid valve regurgitation is trivial. Aortic Valve: No vegetation. The aortic valve is normal in structure. Aortic valve regurgitation is trivial. Pulmonic Valve: The pulmonic valve was normal in structure. Pulmonic valve regurgitation is trivial. Aorta: The aortic root is  normal in size and structure. Pulmonary Artery: No vegetation. IAS/Shunts: No atrial level shunt detected by color flow Doppler. Gary Bickers MD Electronically signed by Gary Bickers MD Signature Date/Time: 07/22/2021/6:08:08 PM    Final    DG HIP UNILAT WITH PELVIS 1V RIGHT  Result Date: 07/10/2021 CLINICAL DATA:  Right total hip replacement EXAM: DG HIP (WITH OR WITHOUT PELVIS) 1V RIGHT COMPARISON:  None. FINDINGS: Intraoperative images during right total hip arthroplasty. Normal alignment without evidence of loosening or new fracture on the final image. IMPRESSION: Intraoperative images during right total hip arthroplasty. Normal alignment without evidence of immediate hardware complication. Electronically Signed   By: Maurine Simmering M.D.   On: 06/28/2021 16:22   VAS [place] LOWER EXTREMITY VENOUS (DVT)  Result Date: 07/21/2021  Lower Venous DVT Study Patient Name:  ALDAHIR LITAKER  Date of Exam:   07/20/2021 Medical Rec #: [id number]  Accession #:    [id number] Date of Birth: 15-Aug-1940       Patient Gender: M Patient Age:   73 years Exam Location:  [facility] Procedure:      VAS [place] LOWER EXTREMITY VENOUS (DVT) Referring Phys: Cornelius Moras XU --------------------------------------------------------------------------------  Indications: Stroke.  Risk Factors: None identified. Limitations: Poor ultrasound/tissue interface and patient positioning, patient immobility. Comparison Study: No prior studies. Performing Technologist: Oliver Hum RVT  Examination Guidelines: A complete evaluation includes B-mode imaging, spectral Doppler, color Doppler, and power Doppler as needed of all accessible portions of each vessel. Bilateral testing is considered an integral part of a complete examination. Limited examinations for reoccurring indications may be performed as noted. The reflux portion of the exam is performed with the patient in reverse Trendelenburg.   +---------+---------------+---------+-----------+----------+-------------------+  RIGHT     Compressibility Phasicity Spontaneity Properties Thrombus Aging       +---------+---------------+---------+-----------+----------+-------------------+  CFV       Full            Yes       Yes                                         +---------+---------------+---------+-----------+----------+-------------------+  SFJ       Full                                                                  +---------+---------------+---------+-----------+----------+-------------------+  FV Prox   Full                                                                  +---------+---------------+---------+-----------+----------+-------------------+  FV Mid    Full                                                                  +---------+---------------+---------+-----------+----------+-------------------+  FV Distal Full                                                                  +---------+---------------+---------+-----------+----------+-------------------+  PFV       Full                                                                  +---------+---------------+---------+-----------+----------+-------------------+  POP       Full  Yes       Yes                                         +---------+---------------+---------+-----------+----------+-------------------+  PTV       Full                                                                  +---------+---------------+---------+-----------+----------+-------------------+  PERO                                                       Not well visualized  +---------+---------------+---------+-----------+----------+-------------------+   +---------+---------------+---------+-----------+----------+-------------------+  LEFT      Compressibility Phasicity Spontaneity Properties Thrombus Aging       +---------+---------------+---------+-----------+----------+-------------------+   CFV       Full            Yes       Yes                                         +---------+---------------+---------+-----------+----------+-------------------+  SFJ       Full                                                                  +---------+---------------+---------+-----------+----------+-------------------+  FV Prox   Full                                                                  +---------+---------------+---------+-----------+----------+-------------------+  FV Mid    Full                                                                  +---------+---------------+---------+-----------+----------+-------------------+  FV Distal Full                                                                  +---------+---------------+---------+-----------+----------+-------------------+  PFV       Full                                                                  +---------+---------------+---------+-----------+----------+-------------------+  POP       Full            Yes       Yes                                         +---------+---------------+---------+-----------+----------+-------------------+  PTV       Full                                                                  +---------+---------------+---------+-----------+----------+-------------------+  PERO                                                       Not well visualized  +---------+---------------+---------+-----------+----------+-------------------+     Stevenson: RIGHT: - There is no evidence of deep vein thrombosis in the lower extremity. However, portions of this examination were limited- see technologist comments above.  - No cystic structure found in the popliteal fossa.  LEFT: - There is no evidence of deep vein thrombosis in the lower extremity. However, portions of this examination were limited- see technologist comments above.  - No cystic structure found in the popliteal fossa.  *See table(s) above for measurements and  observations. Electronically signed by Orlie Pollen on 07/21/2021 at 6:50:30 AM.    Final     Microbiology Recent Results (from the past 240 hour(s))  Culture, Respiratory w Gram Stain     Status: None   Collection Time: 07/22/21  8:03 AM   Specimen: Tracheal Aspirate; Respiratory  Result Value Ref Range Status   Specimen Description TRACHEAL ASPIRATE  Final   Special Requests NONE  Final   Gram Stain   Final    MODERATE WBC PRESENT,BOTH PMN AND MONONUCLEAR FEW YEAST Performed at [facility], [address] [place], [place] [address]    Culture ABUNDANT CANDIDA ALBICANS  Final   Report Status 07/24/2021 FINAL  Final    Lab Basic Metabolic Panel: Recent Labs  Lab 07/22/21 0051 07/23/21 0318 07/23/21 1800 07/24/21 0435 07/25/21 0223 07/26/21 0305 07/27/21 0903 August 04, 2021 0222 Aug 04, 2021 0248  NA 138 137   < > 137 136 135 138 142 139  K 3.8 3.8   < > 4.9 4.4 4.3 4.9 6.1* 6.0*  CL 107 105   < > 108 108 106 109  --  107  CO2 22 23   < > 21* 20* 19* 19*  --  17*  GLUCOSE 135* 88   < > 102* 103* 106* 97  --  75  BUN 61* 66*   < > 67* 67* 72* 85*  --  90*  CREATININE 2.09* 1.91*   < > 2.16* 1.96* 2.09* 2.35*  --  2.68*  CALCIUM 7.1* 7.2*   < > 7.2* 7.5* 7.6* 7.7*  --  7.7*  MG 1.8 2.2   < > 2.2 2.1 2.0 2.0  --  2.2  PHOS 2.2* 3.1  --  3.2 3.4  --   --   --   --    < > =  values in this interval not displayed.   Liver Function Tests: Recent Labs  Lab 07/26/21 0305 08/18/2021 0248  AST 65* 85*  ALT 22 30  ALKPHOS 458* 482*  BILITOT 0.4 0.3  PROT 4.9* 5.1*  ALBUMIN <1.5* <1.5*   No results for input(s): LIPASE, AMYLASE in the last 168 hours. No results for input(s): AMMONIA in the last 168 hours. CBC: Recent Labs  Lab 07/23/21 0318 07/24/21 0435 07/25/21 0223 07/26/21 0305 08/18/21 0222 18-Aug-2021 0248  WBC 15.3* 13.9* 12.1* 12.5*  --  7.8  NEUTROABS  --   --   --   --   --  6.4  HGB 8.2* 7.6* 7.1* 7.8* 11.2* 7.4*  HCT 23.0* 22.6* 20.9* 23.6* 33.0* 22.4*   MCV 83.0 85.3 85.0 86.1  --  88.5  PLT 183 214 195 241  --  282   Cardiac Enzymes: No results for input(s): CKTOTAL, CKMB, CKMBINDEX, TROPONINI in the last 168 hours. Sepsis Labs: Recent Labs  Lab 07/24/21 0435 07/25/21 0223 07/26/21 0305 08-18-21 0248  WBC 13.9* 12.1* 12.5* 7.8  LATICACIDVEN  --   --   --  5.3*    Procedures/Operations  R Total hip arthroplasty  Percutaneous tracheostomy under bronchoscopic visualization CVC TEE A-line Diagnostic bronchoscopy with BAL  Julian Hy 2021/08/18, 2:12 PM

## 2021-07-29 NOTE — Progress Notes (Signed)
Pt vomited, possible aspiration. PT suctioned. ABG done, Fio2 increased to 100%. MD at bedside. Will continue to monitor.

## 2021-07-29 NOTE — Progress Notes (Signed)
eLink Physician-Brief Progress Note Patient Name: Gary Stevenson DOB: 1941/01/17 MRN: [id number]   Date of Service  2021-08-12  HPI/Events of Note  Hypotension - BP - 71/71 with MAP = 58. Patient is almost at ceiling dose of Norepinephrine via PIV. Albumin < 1.5. Nursing reports that the patient is now over breathing the set ventilator rate.   eICU Interventions  Plan: 25% Albumin 25 gm IV x 1 now. ABG STAT.     Intervention Category Major Interventions: Hypotension - evaluation and management  Brilynn Biasi Eugene 08-12-2021, 1:49 AM

## 2021-07-29 DEATH — deceased

## 2023-02-11 NOTE — Progress Notes (Signed)
Subjective:   Patient ID: Gary Stevenson, male   DOB: 82 y.o.   MRN: [id number]   HPI nails   ROS      Objective:  Physical Exam  thick     Assessment:  Nail infection     Plan:  treated
